# Patient Record
Sex: Male | Born: 1940 | Race: Black or African American | Hispanic: No | Marital: Married | Smoking: Light tobacco smoker
Health system: Southern US, Community
[De-identification: ages and names within clinical notes are randomized; demographics above are authoritative.]

## PROBLEM LIST (undated history)

## (undated) ENCOUNTER — Emergency Department (HOSPITAL_COMMUNITY): Admission: EM | Payer: Medicare Other | Source: Home / Self Care

## (undated) DIAGNOSIS — D649 Anemia, unspecified: Secondary | ICD-10-CM

## (undated) DIAGNOSIS — R7611 Nonspecific reaction to tuberculin skin test without active tuberculosis: Secondary | ICD-10-CM

## (undated) DIAGNOSIS — C801 Malignant (primary) neoplasm, unspecified: Secondary | ICD-10-CM

## (undated) DIAGNOSIS — B999 Unspecified infectious disease: Secondary | ICD-10-CM

## (undated) DIAGNOSIS — N189 Chronic kidney disease, unspecified: Secondary | ICD-10-CM

## (undated) DIAGNOSIS — K5900 Constipation, unspecified: Secondary | ICD-10-CM

## (undated) DIAGNOSIS — F419 Anxiety disorder, unspecified: Secondary | ICD-10-CM

## (undated) DIAGNOSIS — I10 Essential (primary) hypertension: Secondary | ICD-10-CM

## (undated) DIAGNOSIS — I8290 Acute embolism and thrombosis of unspecified vein: Secondary | ICD-10-CM

## (undated) DIAGNOSIS — T82898A Other specified complication of vascular prosthetic devices, implants and grafts, initial encounter: Secondary | ICD-10-CM

## (undated) DIAGNOSIS — Z973 Presence of spectacles and contact lenses: Secondary | ICD-10-CM

## (undated) DIAGNOSIS — Z992 Dependence on renal dialysis: Secondary | ICD-10-CM

## (undated) DIAGNOSIS — IMO0001 Reserved for inherently not codable concepts without codable children: Secondary | ICD-10-CM

## (undated) DIAGNOSIS — E079 Disorder of thyroid, unspecified: Secondary | ICD-10-CM

## (undated) DIAGNOSIS — E785 Hyperlipidemia, unspecified: Secondary | ICD-10-CM

## (undated) DIAGNOSIS — K5790 Diverticulosis of intestine, part unspecified, without perforation or abscess without bleeding: Secondary | ICD-10-CM

## (undated) DIAGNOSIS — K219 Gastro-esophageal reflux disease without esophagitis: Secondary | ICD-10-CM

## (undated) HISTORY — DX: Essential (primary) hypertension: I10

## (undated) HISTORY — DX: Diverticulosis of intestine, part unspecified, without perforation or abscess without bleeding: K57.90

## (undated) HISTORY — DX: Hyperlipidemia, unspecified: E78.5

## (undated) HISTORY — DX: Nonspecific reaction to tuberculin skin test without active tuberculosis: R76.11

## (undated) HISTORY — DX: Acute embolism and thrombosis of unspecified vein: I82.90

## (undated) HISTORY — DX: Chronic kidney disease, unspecified: N18.9

## (undated) HISTORY — DX: Dependence on renal dialysis: Z99.2

## (undated) HISTORY — DX: Disorder of thyroid, unspecified: E07.9

## (undated) HISTORY — DX: Malignant (primary) neoplasm, unspecified: C80.1

---

## 2000-01-24 ENCOUNTER — Encounter: Payer: Self-pay | Admitting: Vascular Surgery

## 2000-01-27 ENCOUNTER — Ambulatory Visit: Admission: RE | Admit: 2000-01-27 | Discharge: 2000-01-27 | Payer: Self-pay | Admitting: Vascular Surgery

## 2000-01-31 ENCOUNTER — Encounter: Payer: Self-pay | Admitting: Vascular Surgery

## 2000-01-31 ENCOUNTER — Inpatient Hospital Stay: Admission: RE | Admit: 2000-01-31 | Discharge: 2000-02-03 | Payer: Self-pay | Admitting: Vascular Surgery

## 2000-07-14 ENCOUNTER — Inpatient Hospital Stay (HOSPITAL_COMMUNITY): Admission: EM | Admit: 2000-07-14 | Discharge: 2000-07-18 | Payer: Self-pay | Admitting: *Deleted

## 2000-07-27 ENCOUNTER — Other Ambulatory Visit (HOSPITAL_COMMUNITY): Admission: RE | Admit: 2000-07-27 | Discharge: 2000-08-24 | Payer: Self-pay | Admitting: Psychiatry

## 2000-08-17 ENCOUNTER — Emergency Department (HOSPITAL_COMMUNITY): Admission: EM | Admit: 2000-08-17 | Discharge: 2000-08-17 | Payer: Self-pay | Admitting: Emergency Medicine

## 2000-08-17 ENCOUNTER — Encounter: Payer: Self-pay | Admitting: Emergency Medicine

## 2002-06-16 ENCOUNTER — Inpatient Hospital Stay (HOSPITAL_COMMUNITY): Admission: RE | Admit: 2002-06-16 | Discharge: 2002-06-24 | Payer: Self-pay | Admitting: *Deleted

## 2002-06-16 ENCOUNTER — Encounter: Payer: Self-pay | Admitting: *Deleted

## 2002-06-17 ENCOUNTER — Encounter (INDEPENDENT_AMBULATORY_CARE_PROVIDER_SITE_OTHER): Payer: Self-pay

## 2002-07-07 ENCOUNTER — Inpatient Hospital Stay (HOSPITAL_COMMUNITY): Admission: AD | Admit: 2002-07-07 | Discharge: 2002-07-13 | Payer: Self-pay | Admitting: Vascular Surgery

## 2002-07-08 ENCOUNTER — Encounter (INDEPENDENT_AMBULATORY_CARE_PROVIDER_SITE_OTHER): Payer: Self-pay | Admitting: *Deleted

## 2002-09-08 ENCOUNTER — Encounter: Admission: RE | Admit: 2002-09-08 | Discharge: 2002-11-03 | Payer: Self-pay | Admitting: Vascular Surgery

## 2002-09-29 HISTORY — PX: BELOW KNEE LEG AMPUTATION: SUR23

## 2002-12-26 ENCOUNTER — Encounter: Admission: RE | Admit: 2002-12-26 | Discharge: 2003-01-08 | Payer: Self-pay | Admitting: Family Medicine

## 2003-01-09 ENCOUNTER — Encounter: Admission: RE | Admit: 2003-01-09 | Discharge: 2003-02-02 | Payer: Self-pay | Admitting: Family Medicine

## 2005-11-20 ENCOUNTER — Encounter: Admission: RE | Admit: 2005-11-20 | Discharge: 2005-11-20 | Payer: Self-pay | Admitting: Nephrology

## 2006-05-30 DIAGNOSIS — I8290 Acute embolism and thrombosis of unspecified vein: Secondary | ICD-10-CM

## 2006-05-30 HISTORY — DX: Acute embolism and thrombosis of unspecified vein: I82.90

## 2010-01-11 ENCOUNTER — Ambulatory Visit (HOSPITAL_COMMUNITY): Admission: RE | Admit: 2010-01-11 | Discharge: 2010-01-11 | Payer: Self-pay | Admitting: Urology

## 2010-03-15 ENCOUNTER — Ambulatory Visit: Admission: RE | Admit: 2010-03-15 | Discharge: 2010-03-28 | Payer: Self-pay | Admitting: Radiation Oncology

## 2010-04-15 ENCOUNTER — Ambulatory Visit: Admission: RE | Admit: 2010-04-15 | Discharge: 2010-06-19 | Payer: Self-pay | Admitting: Radiation Oncology

## 2010-07-12 ENCOUNTER — Ambulatory Visit: Payer: Self-pay | Admitting: Vascular Surgery

## 2010-07-30 ENCOUNTER — Ambulatory Visit: Payer: Self-pay | Admitting: Vascular Surgery

## 2010-07-30 ENCOUNTER — Ambulatory Visit (HOSPITAL_COMMUNITY): Admission: RE | Admit: 2010-07-30 | Discharge: 2010-07-30 | Payer: Self-pay | Admitting: Vascular Surgery

## 2010-08-26 ENCOUNTER — Encounter (HOSPITAL_COMMUNITY)
Admission: RE | Admit: 2010-08-26 | Discharge: 2010-10-29 | Payer: Self-pay | Source: Home / Self Care | Attending: Nephrology | Admitting: Nephrology

## 2010-08-30 ENCOUNTER — Ambulatory Visit: Payer: Self-pay | Admitting: Vascular Surgery

## 2010-09-02 ENCOUNTER — Ambulatory Visit (HOSPITAL_COMMUNITY)
Admission: RE | Admit: 2010-09-02 | Discharge: 2010-09-02 | Payer: Self-pay | Source: Home / Self Care | Admitting: Vascular Surgery

## 2010-10-04 ENCOUNTER — Ambulatory Visit
Admission: RE | Admit: 2010-10-04 | Discharge: 2010-10-04 | Payer: Self-pay | Source: Home / Self Care | Attending: Vascular Surgery | Admitting: Vascular Surgery

## 2010-10-18 ENCOUNTER — Ambulatory Visit
Admission: RE | Admit: 2010-10-18 | Discharge: 2010-10-18 | Payer: Self-pay | Source: Home / Self Care | Attending: Vascular Surgery | Admitting: Vascular Surgery

## 2010-10-18 ENCOUNTER — Ambulatory Visit (HOSPITAL_COMMUNITY)
Admission: RE | Admit: 2010-10-18 | Discharge: 2010-10-18 | Payer: Self-pay | Source: Home / Self Care | Attending: Vascular Surgery | Admitting: Vascular Surgery

## 2010-10-18 NOTE — Assessment & Plan Note (Addendum)
OFFICE VISIT  Gerald Davenport, Gerald Davenport DOB:  09/08/1941                                       10/18/2010 [id number]#:[id number]  This is a postop followup.  HISTORY OF PRESENT OF ILLNESS:  This is a 70 year old gentleman who recently I saw on 10/04/2010 for a right brachiocephalic arteriovenous fistula.  At that point it was open and with a good thrill.  Apparently over the last week or so he was found by his nephrologist to longer have a pulse or a thrill within his brachiocephalic arteriovenous fistula and at this point I he does not have any steal symptomatology.  PHYSICAL EXAMINATION:  Vital signs:  Temperature 97.8, blood pressure 162/65, heart rate of 75, respirations of 12.  There is a palpable brachial pulse proximally in what I think is the fistula.  There is still a palpable pulse but I do not feel a pulse or a thrill throughout the rest of the cephalic vein so presumptively the fistula has thrombosed.  MEDICAL DECISION MAKING:  This is a 70 year old gentleman with a right brachiocephalic arteriovenous fistula that I think recently has thrombosed.  I think it is worth trying to attempt a thrombolysis on this fistula given that he is going to need dialysis per the nephrologist in the near future so I will refer him at this point to IR for a thrombolysis of this fistula.  He is going to follow up in a week. If they are unsuccessful we will need to place another access in him unfortunately.    [name], MD Electronically Signed  BLC/MEDQ  D:  10/18/2010  T:  10/18/2010  Job:  [id number]

## 2010-10-25 ENCOUNTER — Ambulatory Visit
Admission: RE | Admit: 2010-10-25 | Discharge: 2010-10-25 | Payer: Self-pay | Source: Home / Self Care | Attending: Vascular Surgery | Admitting: Vascular Surgery

## 2010-10-28 NOTE — Assessment & Plan Note (Signed)
OFFICE VISIT  Gerald, Davenport DOB:  08-06-1941                                       10/25/2010 [id number]#:[id number]  This is a postop followup.  HISTORY OF PRESENT ILLNESS:  This is a 70 year old general that previously had placed a right radiocephalic arteriovenous fistula which failed then he had a right brachiocephalic arteriovenous fistula which initially was successful which failed and was not a candidate for thrombolysis.  At this point the patient has no steal symptomatology in the right arm.  He is able to complete his activities of daily living. This patient is left hand dominant.  PHYSICAL EXAMINATION:  Vital signs:  Left-sided blood pressure 216/95, heart rate 84, respirations were 24.  On examination of the right arm he has a palpable brachial pulse.  No palpable radial pulse.  There is fullness in the tract of the brachiocephalic arteriovenous fistula which is thrombosed.  He does not have a bulging basilic vein on tourniquet examination.  MEDICAL DECISION MAKING:  This is a 70 year old gentleman who is now 2 failed fistulas in the right arm.  I am concerned with proceeding with basilic vein transposition as he needs a staged basilic vein transposition.  Given 2 failures one of the concerns is the possibility of a central venous stenosis as the etiology for these failures.  Though the patient is at chronic kidney disease stage 3 I think that the benefits in this case outweigh the risks in this patient in regards to doing a right arm venogram with central venogram to determine if his central veins are patent.  The patient is aware that this procedure involves installation of dye and there is possibility of anaphylactic reaction and possible hastening renal failure.  We will try to keep the dye load to a minimal.  The patient understands and agrees to proceed forward with such.  Will arrange it for this coming Thursday.  After that we will  determine which access would be his best option in his right arm.    Gerald Hertz, MD Electronically Signed  BLC/MEDQ  D:  10/25/2010  T:  10/28/2010  Job:  [id number]

## 2010-10-31 ENCOUNTER — Ambulatory Visit (HOSPITAL_COMMUNITY)
Admission: RE | Admit: 2010-10-31 | Discharge: 2010-10-31 | Disposition: A | Discharge status: Home / Self Care | Payer: Medicare Other | Source: Ambulatory Visit | Attending: Vascular Surgery | Admitting: Vascular Surgery

## 2010-10-31 DIAGNOSIS — N186 End stage renal disease: Secondary | ICD-10-CM

## 2010-10-31 DIAGNOSIS — T82898A Other specified complication of vascular prosthetic devices, implants and grafts, initial encounter: Secondary | ICD-10-CM

## 2010-10-31 DIAGNOSIS — I120 Hypertensive chronic kidney disease with stage 5 chronic kidney disease or end stage renal disease: Secondary | ICD-10-CM

## 2010-10-31 DIAGNOSIS — N189 Chronic kidney disease, unspecified: Secondary | ICD-10-CM | POA: Insufficient documentation

## 2010-10-31 LAB — POCT I-STAT, CHEM 8
BUN: 32 mg/dL — ABNORMAL HIGH (ref 6–23)
Calcium, Ion: 1.16 mmol/L (ref 1.12–1.32)
Chloride: 104 mEq/L (ref 96–112)
Creatinine, Ser: 2.8 mg/dL — ABNORMAL HIGH (ref 0.4–1.5)
Glucose, Bld: 179 mg/dL — ABNORMAL HIGH (ref 70–99)
Hemoglobin: 10.5 g/dL — ABNORMAL LOW (ref 13.0–17.0)
TCO2: 28 mmol/L (ref 0–100)

## 2010-11-01 ENCOUNTER — Ambulatory Visit: Admit: 2010-11-01 | Payer: Self-pay | Admitting: Vascular Surgery

## 2010-11-01 ENCOUNTER — Ambulatory Visit (INDEPENDENT_AMBULATORY_CARE_PROVIDER_SITE_OTHER): Payer: Medicare Other | Admitting: Vascular Surgery

## 2010-11-01 DIAGNOSIS — N186 End stage renal disease: Secondary | ICD-10-CM

## 2010-11-04 ENCOUNTER — Ambulatory Visit (HOSPITAL_COMMUNITY)
Admission: RE | Admit: 2010-11-04 | Discharge: 2010-11-04 | Disposition: A | Discharge status: Home / Self Care | Payer: Medicare Other | Source: Ambulatory Visit | Attending: Vascular Surgery | Admitting: Vascular Surgery

## 2010-11-04 DIAGNOSIS — N189 Chronic kidney disease, unspecified: Secondary | ICD-10-CM | POA: Insufficient documentation

## 2010-11-04 DIAGNOSIS — N186 End stage renal disease: Secondary | ICD-10-CM

## 2010-11-04 DIAGNOSIS — I120 Hypertensive chronic kidney disease with stage 5 chronic kidney disease or end stage renal disease: Secondary | ICD-10-CM

## 2010-11-04 HISTORY — PX: AV FISTULA PLACEMENT: SHX1204

## 2010-11-04 LAB — POCT I-STAT 4, (NA,K, GLUC, HGB,HCT): Potassium: 3.6 mEq/L (ref 3.5–5.1)

## 2010-11-04 LAB — GLUCOSE, CAPILLARY: Glucose-Capillary: 233 mg/dL — ABNORMAL HIGH (ref 70–99)

## 2010-11-08 NOTE — Assessment & Plan Note (Signed)
OFFICE VISIT  ADAEL, CULBREATH DOB:  13-Jul-1941                                       11/01/2010 [id number]#:[id number]  This is an established patient.  HISTORY OF PRESENT ILLNESS:  This is a 70 year old gentleman who has now undergone 2 right arm accesses with failed radiocephalic and brachiocephalic.  I recently did a venogram on his right arm and it looks like his only option in this arm is a graft.  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  He is left hand dominant and never has had any access placed on the left side.  This patient's past medical history, past surgical history, social history, family history, allergies, medications, and review of systems are unchanged from previous evaluations.  I would refer you to my dictation of July 12, 2010, for the details.  PHYSICAL EXAMINATION:  Today, he had a blood pressure of 174/81, heart rate 65, respirations were 12.  His temperature is 97.9.  General examination:  Well-developed, well-nourished, no apparent distress.  Pulmonary exam:  Lungs were clear to auscultation bilaterally.  No rales, rhonchi, or wheezing.  Symmetric expansion.  Cardiac exam:  Regular rate and rhythm.  Normal S1-S2.  No murmurs, rubs, or thrills.  Vascular exam:  He had easily palpable brachial pulses bilaterally.  I could not feel radial pulse on the right side.  There is a weak radial pulse on the left side.  Palpable femorals bilaterally.  Abdominal exam:  He had a soft abdomen.  Nontender, nondistended.  No guarding or rebound.  No splenomegaly.  Musculoskeletal exam:  He had 5/5 strength in all extremities.  Hand grip was 5/5 in upper extremities.  Intrinsic and extrinsic muscle strength was intact.  His right arm has evidence of 2 accesses in the brachiocephalic and radiocephalic that have healed well and there is no palpable  thrill in either access.  In his left arm, he has a distended cephalic vein which on SonoSite evaluation was noted to be at least 3 mm throughout and appears to be patent all the way up to its confluence with likely the high brachial vein.  Neuro exam:  Cranial nerves II-XII were intact.  Motor was as listed above.  Sensation was grossly intact including both hands.  MEDICAL DECISION MAKING:  This is a 70 year old gentleman who is status post a recent venogram, status post 2 failed right accesses.  The patient either has chronic kidney disease stage 3 or 4, and I do not think that placement of a right arm graft is to his advantage given the limited patency of these grafts.  There would be no point in placing the graft if this patient is not within 2-3 months of starting dialysis which, based on the most recent creatinine, I suspect he is not quite in that window.  At this point, he has acceptable option in the upper arm in the form of a left brachiocephalic arteriovenous fistula.  I think it is worth attempting.  We discussed again the nature of access surgery. He is aware that the risks include possible nerve damage, possible bleeding, infection, possible steal, possible ischemic monomelic neuropathy, possible need for additional procedures, and possibility of failure to mature.  He is aware of this and at this  point agrees to proceed forward.  We will set him up for this coming Monday for a left brachiocephalic arteriovenous fistula.    [name], MD Electronically Signed  BLC/MEDQ  D:  11/01/2010  T:  11/01/2010  Job:  [id number]

## 2010-11-09 NOTE — Op Note (Signed)
  NAME:  Gerald Davenport, Gerald Davenport NO.:  [id number]  MEDICAL RECORD NO.:  [id number]           PATIENT TYPE:  O  LOCATION:  [facility]                         FACILITY:  [facility]  PHYSICIAN:  [name], MD       DATE OF BIRTH:  Oct 13, 1940  DATE OF PROCEDURE:  10/31/2010 DATE OF DISCHARGE:  10/31/2010                              OPERATIVE REPORT   PROCEDURE:  Right arm and central venogram.  PREOPERATIVE DIAGNOSIS:  Chronic kidney disease, failed multiple accesses.  POSTOPERATIVE DIAGNOSIS:  Chronic kidney disease, failed multiple accesses.  SURGEON:  [name], MD  ANESTHESIA:  Noncontrast 25 mL.  FINDINGS: 1. An occluded right cephalic vein. 2. Small right basilic vein that was less than 2 mm. 3. There was patent small brachial vein. 4. Patent axillary vein that was about 6-1/2 mm in diameter. 5. 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 6. The superior vena cava appears patent.  INDICATIONS:  This 70 year old gentleman that now has undergone a right radiocephalic and brachiocephalic arteriovenous fistulas, both of which failed.  The brachiocephalic had actually been patent for approximately 6 weeks, then proceeded to thrombose.  With multiple accesses failing in this patient, I had concerns of whether or not there was some degree of central stenosis as the etiology of this patient's failed accesses, so we discussed going back to the angio suite and performing a right arm and central venogram to determine possibly if the central vasculature or the upper arm vasculature was not compatible with continued accesses. He is aware of the risks of this procedure which included possible anaphylactic reaction to the contrast dye and also possible induction of end-stage renal disease.  The patient is aware of the risks and agreed to proceed forward.  DESCRIPTION OF THE PROCEDURE:  After full informed written consent  was obtained from the patient, he was brought back to the angio suite and placed supine upon angio table.  He was connected to the monitoring equipment and then his forearm IV was connected to extension tubing. Under fluoroscopic guidance, two hand injections were completed to image the upper arm venous structures and also the central venous structures.  Based on these findings, it appears that his central veins are still patent and the patient is not on dialysis right now and so subsequently I think placement of arteriovenous graft in this gentleman is a little bit preliminary.  He is going to follow up with me on Friday and will probably map out his dominant hand, his  left hand to see if there is any fistula options on this side.  COMPLICATIONS:  None.  CONDITION:  Stable.     [name], MD     BLC/MEDQ  D:  10/31/2010  T:  11/01/2010  Job:  [id number]  Electronically Signed by Leonides Sake MD on 11/03/2010 05:08:54 PM

## 2010-11-12 NOTE — Op Note (Signed)
NAME:  Gerald Davenport, Gerald Davenport NO.:  [id number]  MEDICAL RECORD NO.:  [id number]           PATIENT TYPE:  O  LOCATION:  [facility]                         FACILITY:  [facility]  PHYSICIAN:  [name], MD       DATE OF BIRTH:  1941/04/26  DATE OF PROCEDURE: DATE OF DISCHARGE:  11/04/2010                              OPERATIVE REPORT   PROCEDURE:  Placement of a left brachiocephalic arteriovenous fistula.  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  SURGEON:  [name], MD  ASSISTANT:  Pecola Leisure, PA  ANESTHESIA:  Monitored anesthesia care and local.  FINDINGS:  In this case included a dopplerable left radial signal and a dopplerable outflow signal in a cephalic vein.  SPECIMENS:  None.  ESTIMATED BLOOD LOSS:  Minimal.  INDICATIONS:  This is a 70 year old gentleman who has now a chronic kidney disease stage III or IV, and  previously, he has undergone two attempts at a fistula in the right arm and then underwent a venogram, which demonstrated no further fistula options in his right arm.  As this patient is not currently requiring dialysis, it was felt that an attempt at a left- sided fistula would be indicated, so we discussed proceeding with a left brachiocephalic arteriovenous fistula.  He is aware of the risks of this procedure which were similar to the previous access procedures including bleeding, infection, nerve damage, steal syndrome, possible ischemic monomelic neuropathy, possible failure to mature, and possible need for additional procedures.  He is aware of these risks and agreed to proceed forward with such.  DESCRIPTION OF THE OPERATION:  After full informed written consent was obtained from the patient, he was brought back to the operating room and placed supine upon the operating table.  Prior to induction, he  received IV antibiotics.  After adequate anesthesia was obtained, he was then prepped and draped in standard fashion for a left arm access procedure. I turned my attention to his antecubitum, where I made an incision transversely to expose both the cephalic vein and also the brachial artery.  Using blunt dissection and electrocautery, I developed a plane down to the brachial artery, was noted to be a good 4-5 mm in diameter with a decent pulse and then dissected to the cephalic vein.  In the antecubitum, there were some signs of some sclerotic changes around this vein.  I was able to dissect down onto the forearm slightly.  After we dissected out enough of the vein, I then clamped the vein distally, and transected the vein and then tied off the distal vein with a 2-0 silk.  There was not good venous backbleeding from this vein.  I then interrogated it with dilators, ranging from a 2.5 mm up to a 4 mm.  These all passed without any resistance; however, there continued to be limited venous backbleeding.  I then flushed this artery with heparinized saline and clamped it with a clamp.  I then turned my attention to the artery,  reset the exposure.  The artery was placed under tension proximally and distally with vessel loops and then I made an arteriotomy in the artery with a 11 blade, and extended it with a Potts scissor for about a 4.5-mm arteriotomy.  I allowed the artery to bleed antegrade.  There was good pulsatile bleeding and allowed the artery also to bleed retrograde, and there was minimal amount of retrograde bleeding.  I flushed out the artery proximally and distally with heparinized saline and then at this point I took the vein which looked to be at least 4-4.5 mm in diameter and it was sewn in an end-to-side configuration to the brachial artery with a running stitch of 7-0 Prolene.  Prior to completing this anastomosis, I allowed the artery to back bleed and antegrade bleed.  There  was good flow without any frank clot.  I then completed the anastomosis in the usual fashion and then the vessel loops were released immediately.  There was not a strong pulse in the vein but with time, it improved.  I could not feel a distal radial pulse.  Using a continuous Doppler, I interrogated the radial artery, there was a strong radial signal and then proximal and distal to the brachial artery, there were multiphasic waveforms.  I did obtain a dopplerable outflow signal in the cephalic vein that was consistent with a widely patent arteriovenous fistula.  At this point, I examined the lay of the vein and it required some additional dissection to improve the lay.  There was one area where a side branch of the vein needed to be clamped off and tied with a 4-0.  At this point, after irrigating out the wound with non heparinized saline, there was no more active bleeding.  The subcutaneous tissue was reapproximated with a layer of 3-0 Vicryl and then the skin was reapproximated with running subcuticular 4-0 and then the skin was cleaned, dried, and reinforced with Dermabond.  The patient was allowed to waken without difficulties with plan to discharge home.  COMPLICATIONS:  None.  CONDITION:  Stable.     [name], MD     BLC/MEDQ  D:  11/04/2010  T:  11/05/2010  Job:  [id number]  Electronically Signed by Leonides Sake MD on 11/06/2010 04:23:00 PM

## 2010-12-05 ENCOUNTER — Ambulatory Visit (HOSPITAL_COMMUNITY): Payer: Medicare Other | Attending: Nephrology

## 2010-12-05 DIAGNOSIS — N186 End stage renal disease: Secondary | ICD-10-CM | POA: Insufficient documentation

## 2010-12-06 ENCOUNTER — Ambulatory Visit (INDEPENDENT_AMBULATORY_CARE_PROVIDER_SITE_OTHER): Payer: Medicare Other | Admitting: Vascular Surgery

## 2010-12-06 DIAGNOSIS — N186 End stage renal disease: Secondary | ICD-10-CM

## 2010-12-06 NOTE — Assessment & Plan Note (Signed)
OFFICE VISIT  CAELUM, FEDERICI DOB:  12/23/1940                                       12/06/2010 [id number]#:[id number]  This is a postop followup.  HISTORY OF PRESENT ILLNESS:  This is a 70 year old gentleman who is now status post a left brachiocephalic arteriovenous fistula placed on 11/04/2010.  Since then the patient has had no fevers or chills.  The wounds have healed up nicely.  No steal symptoms and is able to complete his activities of daily living.  PHYSICAL EXAMINATION:  Today, he had a temperature of 98.1, blood pressure of 104/79, heart rate of 76, respirations of 12.  On focused exam the left arm incision is well-healed.  There is a palpable thrill throughout the upper arm.  On SonoSite interrogation it ranges from 5 mm up to 7 mm in diameter.  There is an obvious competing branch in the upper arm that is obviously siphoning blood away from the cephalic vein.  MEDICAL DECISION MAKING:  A 70 year old gentleman with a successful brachiocephalic arteriovenous fistula.  The patient is not end-stage renal and does not require hemodialysis currently.  My plan would be to ligate the one competing vein as evident in this upper arm cephalic vein and this hopefully will be enough to tip his left brachiocephalic arteriovenous fistula into a usable size throughout.  The patient agrees with this plan and we will get him taken of on March 19.    [name], MD Electronically Signed  BLC/MEDQ  D:  12/06/2010  T:  12/06/2010  Job:  [id number]

## 2010-12-09 LAB — GLUCOSE, CAPILLARY
Glucose-Capillary: 213 mg/dL — ABNORMAL HIGH (ref 70–99)
Glucose-Capillary: 221 mg/dL — ABNORMAL HIGH (ref 70–99)

## 2010-12-09 LAB — SURGICAL PCR SCREEN: MRSA, PCR: NEGATIVE

## 2010-12-10 LAB — POCT I-STAT 4, (NA,K, GLUC, HGB,HCT)
Glucose, Bld: 204 mg/dL — ABNORMAL HIGH (ref 70–99)
HCT: 30 % — ABNORMAL LOW (ref 39.0–52.0)
Hemoglobin: 10.2 g/dL — ABNORMAL LOW (ref 13.0–17.0)
Sodium: 140 mEq/L (ref 135–145)

## 2010-12-10 LAB — GLUCOSE, CAPILLARY
Glucose-Capillary: 164 mg/dL — ABNORMAL HIGH (ref 70–99)
Glucose-Capillary: 186 mg/dL — ABNORMAL HIGH (ref 70–99)

## 2010-12-10 LAB — SURGICAL PCR SCREEN
MRSA, PCR: NEGATIVE
Staphylococcus aureus: POSITIVE — AB

## 2010-12-16 ENCOUNTER — Ambulatory Visit (HOSPITAL_COMMUNITY)
Admission: RE | Admit: 2010-12-16 | Discharge: 2010-12-16 | Disposition: A | Discharge status: Home / Self Care | Payer: Medicare Other | Source: Ambulatory Visit | Attending: Vascular Surgery | Admitting: Vascular Surgery

## 2010-12-16 DIAGNOSIS — Z992 Dependence on renal dialysis: Secondary | ICD-10-CM | POA: Insufficient documentation

## 2010-12-16 DIAGNOSIS — Y849 Medical procedure, unspecified as the cause of abnormal reaction of the patient, or of later complication, without mention of misadventure at the time of the procedure: Secondary | ICD-10-CM | POA: Insufficient documentation

## 2010-12-16 DIAGNOSIS — N186 End stage renal disease: Secondary | ICD-10-CM

## 2010-12-16 DIAGNOSIS — E119 Type 2 diabetes mellitus without complications: Secondary | ICD-10-CM | POA: Insufficient documentation

## 2010-12-16 DIAGNOSIS — T82898A Other specified complication of vascular prosthetic devices, implants and grafts, initial encounter: Secondary | ICD-10-CM

## 2010-12-16 DIAGNOSIS — I120 Hypertensive chronic kidney disease with stage 5 chronic kidney disease or end stage renal disease: Secondary | ICD-10-CM

## 2010-12-16 DIAGNOSIS — T82598A Other mechanical complication of other cardiac and vascular devices and implants, initial encounter: Secondary | ICD-10-CM | POA: Insufficient documentation

## 2010-12-16 LAB — POCT I-STAT 4, (NA,K, GLUC, HGB,HCT)
Glucose, Bld: 186 mg/dL — ABNORMAL HIGH (ref 70–99)
Hemoglobin: 8.8 g/dL — ABNORMAL LOW (ref 13.0–17.0)

## 2010-12-17 NOTE — Op Note (Signed)
  NAME:  Gerald Davenport, TRUMBULL NO.:  [id number]  MEDICAL RECORD NO.:  [id number]           PATIENT TYPE:  O  LOCATION:  [facility]                         FACILITY:  [facility]  PHYSICIAN:  [name], MD       DATE OF BIRTH:  1941-04-22  DATE OF PROCEDURE:  12/16/2010 DATE OF DISCHARGE:  12/16/2010                              OPERATIVE REPORT   PROCEDURE:  Ligation of left brachiocephalic arteriovenous fistula competing branch.  PREOPERATIVE DIAGNOSIS:  Non-maturing left brachiocephalic arteriovenous fistula and competing branch and left upper arm fistula.  POSTOPERATIVE DIAGNOSES:  Non-maturing left brachiocephalic arteriovenous fistula and competing branch and left upper arm fistula.  SURGEON:  Arlys Lorrie L. Imogene Burn, MD  There were no assistants.  ANESTHESIA:  Monitored anesthesia care and local.  FINDINGS:  In this case included resolution of branch distention with ligation and transection and also palpable thrill throughout this graft at the end of case.  SPECIMENS:  None.  ESTIMATED BLOOD LOSS:  Minimum.  INDICATIONS:  This is a 70 year old gentleman I had previously placed a left brachiocephalic arteriovenous fistula.  The brachiocephalic arteriovenous fistula had been distending appropriately, but was not quite at the necessary 6 mm for use for dialysis purposes.  There was on obvious branch in the upper arm that was siphoning flow from this fistula on just visual examination.  I felt that ligating this would improve his chances of fully distending and maturing this brachiocephalic arteriovenous fistula.  The patient agreed to proceed forward with the procedure.  He is aware of the risks of this procedure include bleeding, possible infection, and possible thrombosis of the brachiocephalic arteriovenous fistula.  He agreed to proceed forward.  DESCRIPTION OF OPERATION:  After full informed written consent was obtained from the patient, he was brought back to the  operating room and placed supine upon the operating table.  After obtaining adequate sedation, he was prepped and draped in standard fashion for a left arm access procedure.  I turned my attention to his upper arm where I had previously marked the competing branch, injected about 1 mL of 1% lidocaine with epinephrine and 0.5% Marcaine without epinephrine to obtain anesthesia.  I made an incision here with a 15 blade and then dissected down to the vein.  This was pulled into view and then a right angle placed around it, then I placed two 2-0 ties of silk around this vein, and then tied it off and then transected this vein.  At this point, then I closed the skin incision with an U stitch of 4-0 Monocryl. The patient's arm was then cleaned, dried, and then Dermabond used to reinforce the skin closure.  The patient awoke without any difficulties and was discharged home without any problems.  COMPLICATIONS:  None.  CONDITION:  Stable.     [name], MD     BLC/MEDQ  D:  12/16/2010  T:  12/17/2010  Job:  [id number]  Electronically Signed by Leonides Sake MD on 12/17/2010 10:56:34 AM

## 2010-12-27 ENCOUNTER — Ambulatory Visit (INDEPENDENT_AMBULATORY_CARE_PROVIDER_SITE_OTHER): Payer: Medicare Other | Admitting: Vascular Surgery

## 2010-12-27 DIAGNOSIS — N186 End stage renal disease: Secondary | ICD-10-CM

## 2010-12-30 NOTE — Assessment & Plan Note (Signed)
OFFICE VISIT  ORMAN, MATSUMURA DOB:  01-17-1941                                       12/27/2010 [id number]#:[id number]  This is a postop followup.  This gentleman I took back to the operating room recently for a ligation of a competing branch.  At this point he notes no steal symptomatology in his left hand and is able to complete activities of daily living without any problems.  PHYSICAL EXAMINATION:  Today he had a blood pressure of 151/70, heart rate of 88, respirations were 12.  On focused examination he had a healing incision in the left upper arm.  The brachiocephalic arteriovenous fistula looks larger and more visible than previous.  On SonoSite evaluation he is now >6 mm throughout most of this fistula. There is an easily palpable thrill.  He has full hand grip in his left arm and sensation grossly intact in his fingertips.  MEDICAL DECISION MAKING:  This is a 70 year old gentleman who is now at least chronic kidney disease stage 4 and possibly 5, not end-stage renal at this point.  My suspicion is this left brachiocephalic arteriovenous fistula will be easily usable for dialysis if eventually his kidney function deteriorates to the point of him becoming end-stage renal.  At this point I will release him from our care.  I discussed with him some exercises to possibly improve dilation in this fistula and then also he is going to keep an eye on this.  He knows that if at any point the thrill decreased he is going to go ahead and call us and we can possibly reevaluate it at that point and possible fistulogram or intervention as necessary at that point.  Thank you for giving [place] the opportunity to participate in this patient's care.    [name], MD Electronically Signed  BLC/MEDQ  D:  12/27/2010  T:  12/30/2010  Job:  [id number]

## 2011-02-11 NOTE — Assessment & Plan Note (Signed)
OFFICE VISIT   Davenport, Gerald  DOB:  1941-01-15                                       07/12/2010  GNFAO#:[id number]   This is a new patient consultation.  It is requested by Dr. Sadie Haber with [facility].  Reason for consultation is new  permanent access.   HISTORY OF PRESENT ILLNESS:  This is a 70 year old gentleman who now  presents with chief complaint of need for permanent access.  He has  chronic kidney disease stage IV that is felt over the next few months  may require initiation of hemodialysis.  He notes he is left hand  dominant, never had any previous accesses, never previous any central  venous catheterizations, previously no sensory or motor deficits.   PAST MEDICAL HISTORY:  1. He has chronic kidney disease stage IV.  2. Hypertension.  3. Diabetes.  4. Secondary hyperparathyroidism.  5. Tobacco abuse.  6. Prostate cancer treated with radiation therapy.   PAST SURGICAL HISTORY:  Right below the knee amputation.   SOCIAL HISTORY:  He is an active smoker with a 50 pack year history.  Denies any alcohol or illicit drug use.   FAMILY HISTORY:  Father had peripheral arterial disease and mother had a  stroke.   MEDICATIONS:  Ramipril, metformin, Zocor, glyburide, doxazosin,  Wellbutrin, Seroquel, clonidine, Lasix, Micardis / hydrochlorothiazide,  aspirin, Optiva.   REVIEW OF SYSTEMS:  Noted slurred speech and prostate cancer and kidney  disease.  Otherwise the rest of his 12 point review of system was noted  be negative.   PHYSICAL EXAMINATION:  Vital signs:  Blood pressure 174/76, heart rate  88, respirations were 12.  General:  Alert and oriented x3, well-developed, well-nourished.  Head:  Normocephalic, atraumatic.  ENT:  Hearing was grossly intact.  The nares without any erythema or  drainage.  The oropharynx without erythema or exudate.  Eyes:  Pupils equal, reactive to light.  Extraocular movements were  intact.  Neck:  Supple neck.  No nuchal rigidity.  No palpable lymphadenopathy.  Pulmonary:  Symmetric expansion.  Good air movement.  He was clear to  auscultation all fields.  No rales, rhonchi or wheezing.  Cardiac:  Regular rate and rhythm.  Normal S1-S2.  No murmurs, rubs,  thrills or gallops.  Vascular:  He had palpable bilateral radial, ulnar,  brachial and carotid pulses.  There were no bruits bilaterally.  Aorta  was not palpable.  He had palpable femoral pulses bilaterally.  His  right popliteal I did not appreciate any pulses.  His DP and PT are not  applicable as he is a below the knee amputation on this site.  On his  left side I do not palpate a popliteal, dorsalis pedis or posterior  tibial pulse.  GI:  Soft, nontender, nondistended.  No guarding or rebound.  No  hepatosplenomegaly.  No masses.  No costovertebral angle tenderness.  Musculoskeletal:  Bilateral upper extremities had intact 5/5 strength,  intrinsic extrinsic strength in upper extremities was intact.  Both legs  had intact extension and flexion  at the hips.  The left leg had intact  plantar flexion and dorsal flexion.  The right leg was not able to  interrogate the lower extremity as he has a BKA on this site.  Neuro:  Cranial nerves II-XII are intact.  Pain and light touch was  intact except in the right lower extremity because of the amputation.  Motor exam was as noted above.  Psychiatric:  Judgment was intact.  Mood  and affect were appropriate to the clinical situation.  Skin:  Refer to the musculoskeletal exam for the extremities.  There  were no rashes otherwise noted.  Lymphatic:  There was no cervical, axillary or inguinal lymphadenopathy.   NON-INVASIVE VASCULAR STUDIES:  :  He had bilateral cephalic and basilic  vein mapping.  His right arm was the nondominant arm and demonstrates a  cephalic that ranges from 3.9 mm down to 2.5 mm in the forearm.  However, with distention to try to verify this I  felt that in actuality  his vein appeared to be bigger than 2.3 mm.  He has also on the right  side an acceptable basilic vein for use.  On the left side he has  cephalic vein both upper and lower arms that are acceptable diameters  for possible use as a fistula and his left basilic vein is adequate  caliber.   MEDICAL DECISION MAKING:  This is a 69 year old male with chronic kidney  disease stage IV that is getting close to requiring dialysis.  His  nephrologist requests evaluation at this point for permanent access.  Based on his vein mapping he would be an acceptable candidate for a  right radiocephalic fistula.  We discussed in about 10 minutes the  risks, benefits and alternatives to placement of arteriovenous fistula.  We discussed extensively the risks of but no limited to: steal, ischemic  monomelic neuropathy, failure to mature, need for additional dialysis  procedures, and nerve damage,  and he agrees at this point to proceed  forward with the procedure.  He is tentatively scheduled for 07/30/2010.     Leonides Sake, MD  Electronically Signed   BC/MEDQ  D:  07/12/2010  T:  07/15/2010  Job:  [id number]

## 2011-02-11 NOTE — Procedures (Signed)
CEPHALIC VEIN MAPPING   INDICATION:  Preop vein mapping for AVF placement.   HISTORY:  End-stage renal disease.   EXAM:  The right cephalic vein is compressible.   Diameter measurements range from 0.23 cm to 0.44 cm.   The right basilic vein is compressible.   Diameter measurements range from 0.21 to 0.41 cm.   The left cephalic vein is compressible.   Diameter measurements range from 0.27 to 0.51.   The left basilic vein is compressible.   Diameter measurements range from 0.23 to 0.55 cm.   See attached worksheet for all measurements.   IMPRESSION:  Patent right and left cephalic and basilic veins.   ___________________________________________  Leonides Sake, MD   EM/MEDQ  D:  07/12/2010  T:  07/12/2010  Job:  [id number]

## 2011-02-11 NOTE — Assessment & Plan Note (Signed)
OFFICE VISIT   URIAH, PHILIPSON  DOB:  1941/02/28                                       10/04/2010  EAVWU#:[id number]   This is a postop followup.   HISTORY OF PRESENT ILLNESS:  This is a 70 year old gentleman status post  a right brachiocephalic arteriovenous fistula placement who presents now  for followup.  Since his previous procedure has had no steal symptoms,  able to complete activities of daily living.  No numbness or weakness  noted in the right hand.   PHYSICAL EXAMINATION:  Vital signs:  Blood pressure 177/71, heart rate  of 81, respirations were 12.  Focused exam:  He has healed up his right  antecubital incision.  There is a palpable thrill in the upper arm and  you can actually see the distention on the brachiocephalic arteriovenous  fistula.   On ultrasound at some segments only about 4 mm, at some segments it is  up to 5.5 mm.   MEDICAL DECISION MAKING:  This is a 70 year old gentleman status post a  right brachiocephalic arteriovenous fistula about postop day 30.  At  this point the brachiocephalic arteriovenous fistula has not adequately  dilated.  He is not end-stage renal requiring hemodialysis yet so there  is at this point no rush to try to complete any type of balloon assisted  maturation.  I am going to have him follow up in 1 month and will re-  ultrasound the fistula at that point to see if it has obtained adequate  dilation.  He is going to work on exercises to improve the diameter of  this vein.  He also has successfully undergone this procedure without  any development of steal symptoms.     [name], MD  Electronically Signed   BLC/MEDQ  D:  10/04/2010  T:  10/07/2010  Job:  [id number]   cc:   Aram Beecham B. Eliott Nine, M.D.

## 2011-02-11 NOTE — Assessment & Plan Note (Signed)
OFFICE VISIT   RAYMONE, PEMBROKE  DOB:  July 20, 1941                                       08/30/2010  ZOXWR#:[id number]   This is an established patient, postop follow-up.   HISTORY OF PRESENT ILLNESS:  This is a 70 year old gentleman who is now  status post a right radiocephalic arteriovenous fistula that was  completed on July 30, 2010.  The patient is sent back here from the  nephrologist as the fistula occluded.  The patient is not aware of  exactly when it occluded.  He at this point has no steal symptoms. with  no numbness in his hands.  He is able to complete his activities of  daily living without any problems.  There are no significant changes in  his past medical history or past surgical history, social history,  family history, review of systems, medications, or allergies.  Please  refer to my previous dictation on the 14th of  October 2011 for that  information.   PHYSICAL EXAMINATION:  Today he had a blood pressure of 158/73, heart  rate 64, respirations 12, temperature is 98.  On focused exam on the  right arm he has no palpable thrill in the forearm.  There is a palpable  radial pulse.  Hand grip 5/5.  Sensation grossly was intact on testing.  He has good intrinsic and extrinsic hand muscle strength on this side.   MEDICAL DECISION MAKING:  This 70 year old gentleman is status post  failed right radiocephalic arteriovenous fistula.  Based on his previous  vein mapping, he has an upper arm cephalic vein ranging 0.29-0.37 cm.  This would be my next location for attempted arteriovenous fistula.  He  is tentatively scheduled for the September 02, 2010.  We discussed the  risks of this procedure which included but were not limited to bleeding,  infection, nerve damage, steal,  ischemic monomelic neuropathy, possible  failure to mature and possible need for additional procedures.  He is  aware of such and agrees to proceed forth with  operation.     Leonides Sake, MD  Electronically Signed   BC/MEDQ  D:  08/30/2010  T:  08/30/2010  Job:  [id number]

## 2011-02-14 NOTE — H&P (Signed)
[facility]  Patient:    Gerald Davenport, Gerald Davenport                          MRN: [id number] Adm. Date:  [id number] Disc. Date: [id number] Attending:  Denny Peon                   Psychiatric Admission Assessment  INTRODUCTION:  Patient is a 70 year old black married male with long history of heavy drinking, at present drinking one fifth of vodka and several beers daily.  This has been drinking pattern for past 10 years.  He came for detox. He tried to stop on his own but after 2-3 days of sobriety he relapsed.  He does not give history of withdrawal, delirium or seizures.  Patient feels that alcohol hurts his health especially elevates his blood pressure and destroys his family.  He denies depression, hallucinations or dangerous ideation.  PAST PSYCHIATRIC HISTORY:  He was depressed about being laid off his job but recover with treatment.  Never hospitalized for detox and depression.  His family doctor prescribed Wellbutrin 150 mg twice a day with good results.  SOCIAL HISTORY:  Patient is a retired Psychiatric nurse. Ninth grade education. Married, patient has a wife and two sons.  FAMILY HISTORY:  There is no history of mental or substance abuse problems within his blood relatives.  ALCOHOL AND DRUG HISTORY:  Patient drinks approximately one fifth of vodka and 6-8 beers on a daily basis for at least 10 years and he also smokes one pack of cigarettes per day.  MEDICAL HISTORY:  Patient suffers from non-insulin-dependent diabetes mellitus, hypertension, has problems with peripheral circulation, lower leg bypass surgery for this reason.  He suffers from benign prostatic hypertrophy.  CURRENT MEDICATIONS:  Glucophage, Altace, verapamil, hydrochlorothiazide, Vioxx, ______________, glyburide, Wellbutrin, as well as Tricor.  PHYSICAL EXAMINATION:  Normal physical examination in emergency room.  ALLERGIES:  Patient is allergic to [facility].  MENTAL STATUS  EXAMINATION:  Elderly black male, balding, cooperative, pleasant.  No abnormal movements, tremors, normal gait.  Speech was normal but slightly slurred.  Patient does not wear his denture.  Mood was euthymic. Affect was anxious.  Thoughts were organized and goal directed.  No dangerous ideations.  No paranoia.  Alert and oriented x 3 with good memory.  Attention: Recalled 2/3 objects.  Decreased concentration.  Normal intelligence.  Insight good.  Judgment good.  DIAGNOSTIC IMPRESSION: Axis I:    1. Alcohol dependence.            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 Axis II:   No diagnosis. Axis III:  1. Non-insulin-dependent diabetes mellitus.            2. Hypertension.            3. Benign prostatic hypertrophy. Axis IV:   Moderate. Axis V:    Global Assessment of Functioning 40; past year 60.  PLAN:  Will go through phenobarbital detox.  Decrease Wellbutrin which could increase risk of seizures while patient is in detox.  Will check fasting blood sugar as well as monitor patients blood pressure. DD:  07/19/00 TD:  07/20/00 Job: [id number]

## 2011-02-14 NOTE — Discharge Summary (Signed)
[facility]  Patient:    Gerald Davenport, Gerald Davenport                          MRN: [id number] Adm. Date:  [id number] Disc. Date: [id number] Attending:  Annamarie Dawley Dictator:   Johnella Moloney, NP                           Discharge Summary  HISTORY OF PRESENT ILLNESS:  [name] is a 70 year old black married male with a long history of heavy drinking, at present drinking one fifth of vodka and several beers daily.  This has been his drinking pattern for the past 10 years and he came in for detox.  He tried to stop on his own but after 2 or 3 days of sobriety he relapsed.  He does not give a history of withdrawal delirium or seizures.  Patient feels that alcohol hurts his health especially, elevates his blood pressure and destroys his family.  He denies depression,  The patient states that he has never been hospitalized for detox or depression.  His family doctor has treated him on an outpatient basis with Wellbutrin 150 mg b.i.d. with good results.  PAST MEDICAL HISTORY:  Patient does have a primary care physician, name unknown.  His medical problems include diabetes mellitus type 2, hypertension, peripheral circulation problems, benign prostatic hypertrophy.  Current medications include Glucophage, Altace, verapamil, HCTZ, Vioxx, Glyburide, Wellbutrin and Tri-Cor.  DRUG ALLERGIES:  He reports being allergic to TRI-COR.  PHYSICAL EXAMINATION:  It was done in the Emergency Department and there were no abnormal findings.  LABORATORY DATA:  Thyroid panel within normal limits.  His anemia study showed his B12 low at 190.  His ferritin was high at 446.  Currently no other lab work tests are on the record.  MENTAL STATUS EXAMINATION:  On admission, elderly black male, balding, cooperative and pleasant, no abnormal movement tremors, normal gait, speech normal but slightly slurred.  Patient does not wear his dentures.  Mood was euthymic.  Affect anxious.  Thoughts  organized and goal directed.  No dangerous ideation, no paranoia.  Alert and oriented x 3 with good memory retention, recalling 2 out of 3 objects, decreased concentration, normal intelligence.  Insight good, judgment good.  ADMITTING DIAGNOSES: Axis I:     1. Alcohol depression.             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 Axis II:    No diagnosis. Axis III:   Diabetes mellitus type 2, hypertension, benign prostatic             hypertrophy. Axis IV:    Moderate. Axis V:     Global assessment of function on admission 40, highest past             is 60.  HOSPITAL COURSE:  The patient was admitted to the [facility] unit and to be placed on a detox protocol.  He was given phenobarbital protocol.  The Wellbutrin was decreased since it could increase risk of seizures while the patient is in detox.  Also check his fasting blood sugar as well as monitor his CBGs.  He was placed on an 1800 ADA Kcal diet, as well again as the phenobarbital protocol, and we continued his Glucophage 500 b.i.d., Altace 2.5 q.d., verapamil SR 240 mg q.d., HCTZ 12.5 q.d., Vioxx 12.5 t.i.d. with meals, doxazosin mesylate 8 mg  q.d., Glyburide 10 mg q.d., Advil 10 mg 2 tabs q.6h. p.r.n. pain, Wellbutrin SR 150 mg b.i.d., Tri-Cor 200 mg q.d.  On the second day of admission, we did decrease his Wellbutrin SR to 100 b.i.d., as well as monitoring his CBGs and requesting the physical examination and labs from [facility] Emergency Department.  We on the 8th day placed him on Seroquel 25 mg at h.s.  We also  on the 9th day increased his Seroquel to 50 mg h.s., increased his HCTZ to 25 mg daily.  While he was in the hospital, he did fairly well.  There were no signs or symptoms of withdrawal.  We continued the detox, and we were waiting for his pending laboratory work.  He was verbally committed to sobriety.  Laboratory work showed anemia that could be secondary to vitamin B12 deficiency, so we did  decide to start vitamin B12 and we increased the Seroquel.  On October 20, the patient felt like he could be discharged, going to [facility] using the [facility] program, but then he changed his mind, wanting something closer to his home.  Then again he changed his mind, wanting to attend to attend the [facility] clinic and decided that he would follow the outpatient CD program.  So therefore it was decided that he detoxed safely, we could manage him on an outpatient basis that was fairly structured.  CONDITION ON DISCHARGE:  Patient has been detoxed successfully, improvement in mood, sleep, appetite and alleviation of any  suicidal or homicidal ideations. He has gotten improvement in his energy.  DISPOSITION:  Patient discharged home.  FOLLOW UP:  The patient is to follow up at the [facility] CDIOP program, starting July 20, 2000 at 4 p.m.  He is also to follow up with his medical doctor to adjust his blood pressure medications and to look at his anemia, and also look at his follow up with Vitamin B12 injections and follow up with his diabetes.  He was told to stay on his 1800 ADA diet and also absolutely no alcohol.  DISCHARGE MEDICATIONS: 1. Wellbutrin SR 100 mg 1 b.i.d. 2. Seroquel 100 mg 1 tab at h.s.  Continue his medications ordered by his medical doctor: 1. HCTZ 25 mg 1 daily. 2. Glucophage 500 1 b.i.d. 3. Altace 2.5 1 daily. 4. Verapamil SR 240 mg 1 daily. 5. Vioxx 12.5 mg t.i.d. 6. Doxazosin mesylate 8 mg daily. 7. Tri-Cor 200 mg daily. 8. Vitamin B12 100 mcg injections daily x 5, then follow instructions of    his family doctor.  FINAL DIAGNOSIS: Axis I:     1. Depressive disorder not otherwise specified.             2. Alcohol dependence. Axis II:    No diagnosis. Axis III:   non-insulin-dependent diabetes mellitus, hypertension, benign             prostatic hypertrophy, vitamin B12 deficiency, anemia. Axis IV:    Mild. Axis V:     Current global assessment of  function at discharge 50, highest             past year 60.  DD:  09/14/00 TD:  09/14/00 Job: [id number]

## 2011-02-14 NOTE — Discharge Summary (Signed)
NAME:  Gerald Davenport, Gerald Davenport NO.:  [id number]   MEDICAL RECORD NO.:  [id number]                   PATIENT TYPE:  INP   LOCATION:  5156                                 FACILITY:  [facility]   PHYSICIAN:  Juluis Mire, P.A.              DATE OF BIRTH:  [birth year]   DATE OF ADMISSION:  07/07/2002  DATE OF DISCHARGE:  07/13/2002                                 DISCHARGE SUMMARY   REFERRING PHYSICIAN:  L. Lupe Carney, M.D.   ADMISSION DIAGNOSES:  1. Occluded right femoral-popliteal bypass graft with ischemic right lower     extremity, non-healing ulcer.  2. Hypertension.  3. Adult onset diabetes mellitus, non-insulin-dependent.  4. Hypercholesterolemia.  5. Benign prostatic hypertrophy.  6. Chronic obstructive pulmonary disease with ongoing tobacco use.   DISCHARGE DIAGNOSES:  1. Occluded right femoral-popliteal bypass graft with ischemic right lower     extremity, non-healing ulcer.  2. Hypertension.  3. Adult onset diabetes mellitus, non-insulin-dependent.  4. Hypercholesterolemia.  5. Benign prostatic hypertrophy.  6. Chronic obstructive pulmonary disease with ongoing tobacco use.   PROCEDURE:  Right below-the-knee amputation on July 08, 2002.   HISTORY OF PRESENT ILLNESS:  The patient is a 70 year old black male who  originally underwent a right femoral below-the-knee popliteal bypass graft  with a 6.0 mm Gore-Tex on Jan 31, 2000.  More recently he presented with an  occlusion of the graft on June 16, 2002, and underwent a thrombectomy  of his right femoral to popliteal bypass graft, as well as a thrombectomy of  the tibial vessels with a successful reopening of the graft.  He was placed  on Coumadin postoperatively, and was discontinued on June 27, 2002.  At  the time of his admission he has had a breakdown of the plantar surface of  his right foot.  He has significant pain in the right foot since his last  surgery.  Pain medications  help him, though it keeps him awake at night,  secondary to pain.  He has a plantar ulceration which has not improved, and  seems to have developed ischemic changes to his toes.  They are swollen and  tender.  He was seen in the CVTS Office today.  His graft was found to be  patent.  He has moderate Doppler signals at the anterior tibial region.  The  foot appears non-viable, despite good indices.  He presents now for a right  below-the-knee amputation on July 08, 2002.   MEDICATIONS ON ADMISSION:  1. Wellbutrin 150 mg p.o. b.i.d.  2. Metformin/hydrochlorothiazide 500 mg  two tab p.o. b.i.d.  3. Altace 5 mg q.d.  4. Verapamil SR 240 mg q.d.  5. Doxazosin 8 mg q.d.  6. Tri-Chlor 160 mg q.d.  7. Coumadin 5 mg q.d., on hold.  8. Catapres 0.1 mg p.o. b.i.d.  9. Seroquel 50 mg p.o. q.d.   PAST  MEDICAL HISTORY:  1. Peripheral vascular occlusive disease.  2. Hypertension.  3. Diabetes mellitus type 2.  4. Hypercholesterolemia.  5. Benign prostatic hypertrophy.  6. Chronic tobacco use, ongoing, with chronic obstructive pulmonary disease.  7. History of heavy alcohol use.  8. History of genital warts.   PROCEDURE:  Right below-the-knee amputation on July 08, 2002.   HISTORY:  The patient is a 70 year old black male who underwent a right  femoral below-the-knee popliteal bypass graft with a 6.0 mm Gore-Tex on Jan 31, 2000.  He returned with an occlusion of his graft on June 16, 2002,  and underwent a successful thrombectomy of his right femoral, along with his  tibial arteries.  He was subsequently discharged home, but returns now with  pain in his right foot.  He has significant pain since his last surgery.  He  has a plantar ulceration which is still present.  His skin is darkened.  His  fourth and fifth toes have become mottled.  He has rest pain.  It was Dr.  Antonietta Barcelona opinion that he should be admitted for an amputation.   ALLERGIES:  No known drug allergies.    HOSPITAL COURSE:  The patient was admitted.  His INR was still elevated.  He  was given one unit of fresh frozen plasma.  He was taken to the operating  room the next day, where he underwent a right below-the-knee amputation.  He  has made good progress.  He has had no postoperative complications.  He was  seen in consultation by rehab, and it was their opinion that the patient was  doing well.  He was anxious to go home.  Because of his excellent progress,  he could undergo therapy at home.  A home health physical therapist had been  requested.  He will continue to clean his wounds with plain soap and water,  and followed by Kerlix and an Ace.   DISCHARGE MEDICATIONS/INSTRUCTIONS:  He is to resume all of his preadmission  medications as before, except for the Coumadin.  He is also to withhold his  Glucophage if his CBG is less than 120.   DISPOSITION:  At this point his stump is healing nicely.  He is tolerating  the discomfort with oral medications.  He is working with PT and OT for  mobilization and ADLs.   DIET:  He is to maintain his diabetic diet.   WOUND CARE:  He is to clean his incision with plain soap and water.  To keep  a dressing and Ace on his stumps.    FOLLOW UP:  He is to call Dr. Clovis Riley for any medical followup.  He is to  return to see Dr. Arbie Cookey on Wednesday, July 27, 2002, at 1:20 p.m.   CONDITION ON DISCHARGE:  Improved.                                                 Juluis Mire, P.A.    JW/MEDQ  D:  07/12/2002  T:  07/12/2002  Job:  [id number]

## 2011-02-14 NOTE — Op Note (Signed)
   NAME:  Gerald Davenport, Gerald Davenport NO.:  [id number]   MEDICAL RECORD NO.:  [id number]                   PATIENT TYPE:  INP   LOCATION:  5156                                 FACILITY:  [facility]   PHYSICIAN:  Larina Earthly, M.D.                 DATE OF BIRTH:  [birth year]   DATE OF PROCEDURE:  07/08/2002  DATE OF DISCHARGE:                                 OPERATIVE REPORT   PREOPERATIVE DIAGNOSES:  Gangrene, left foot.   POSTOPERATIVE DIAGNOSES:  Gangrene, left foot.   PROCEDURE:  Right below-knee amputation.   SURGEON:  Larina Earthly, M.D.   ASSISTANT:  Lissa Merlin, P.A.   ANESTHESIA:  MAC.   COMPLICATIONS:  None.   DISPOSITION:  To recovery stable.   PROCEDURE IN DETAIL:  The patient was taken to the operating room and placed  supine.  The area of the right foot and leg were prepped and draped in the  usual sterile fashion.  Using a posterior-based muscle flap, an incision was  made several fingerbreadths below the tibial prominence and carried down  through the anterior tibial muscle bundles.  The anterior tibial artery was  occluded with hemostats and divided.  The gastrocnemius was left intact with  a posterior muscle flap and the soleus divided in line with the tibial  resection.  The popliteal artery was occluded with hemostats and divided.  The periosteum was elevated off the tibia, and the tibia was divided with  the Gigli saw.  The fibula was divided with bone shears.  The specimen was  passed off the field.  The vessels were ligated with 0 Vicryl ties.  Hemostasis was obtained with electrocautery, and the wounds were irrigated  with saline.  The posterior based fascia was sewn to the anterior fascia  with interrupted 0 figure-of-eight Vicryl sutures.  The skin was closed with  skin clips.  A sterile dressing was applied, and the patient was taken to  the recovery room in a stable condition.                                               Larina Earthly, M.D.    TFE/MEDQ  D:  07/08/2002  T:  07/08/2002  Job:  [id number]

## 2011-02-14 NOTE — Op Note (Signed)
[facility]  Patient:    Gerald Davenport, Gerald Davenport                          MRN: [id number] Proc. Date: 01/26/99 Adm. Date:  [id number] Attending:  Alyson Locket CC:         Dr. Theodoro Grist, Lyndee Leo D. Clovis Riley, MD                           Operative Report  PREOPERATIVE DIAGNOSIS:  Ischemic right foot.  POSTOPERATIVE DIAGNOSIS:  Ischemic right foot.  OPERATION PERFORMED:  Aortogram bilateral lower extremity run-off.  SURGEON:  Larina Earthly, M.D.  ANESTHESIA:  1% lidocaine local, 1 mg IV Versed sedation.  COMPLICATIONS:  None.  DISPOSITION:  To holding area stable.  DESCRIPTION OF PROCEDURE:  The patient was taken to the cardiac catheterization lab and placed in supine position where the area of both groins was prepped and draped in the usual sterile fashion.  Using local anesthesia and a single wall stick, he right common femoral artery was entered.  Initially a pigtail catheter was used and there was some difficulty in passing the catheter.  Hand injection was undertaken and this showed some extravasation outside the artery.  The needle was removed.  Pressure was held and another attempt at puncture was successful and a Wholey wire was passed up to the level of the superrenal aorta.  A 5 French sheath was passed over this and a pigtail catheter was then passed up to the level of the superrenal aorta as well.  AP injections were undertaken at the level of the aorta and this revealed a widely patent infrarenal abdominal aorta.  The patient had single widely patent large renal arteries.  The catheter was then withdrawn down to the aortic bifurcation and the AP projection was undertaken.  This revealed slight mild narrowing of the common iliac arteries bilaterally.  The external iliac arteries were widely patent.  Left leg run-off revealed a widely patent superficial femoral artery.  There was narrowing at the level of  the popliteal artery at the knee.  There was washout of contrast in the distal tibial just above the ankle.  The patient had normal three-vessel run-off at this level.  The pigtail catheter was then removed and right leg run-off was obtained via the right femoral sheath. his revealed widely patent common femoral artery.  There was focal stenosis of the proximal superficial femoral artery with the remaining portion of the artery being normal.  At the adductor canal the artery became quite small and was completely  occluded above the level of the popliteal artery of the knee.  There was irregularity of the popliteal artery at the knee level as well.  The bloody popliteal artery was patent but small.  There was occlusion of the posterior tibial artery just after its take-off with reconstitution of collaterals.  The perineal artery was a dominant run-off artery.  The anterior tibial artery was also patent. A right oblique injection was then undertaken as well for further definition of the tibial vessels.  Again, this revealed a small below knee popliteal artery with peroneal and anterior tibial run-off.  The anterior tibial was patent into the foot as the dorsalis pedis artery.  The patient tolerated the procedure without  complication and was transferred to the holding area  in stable condition.  FINDINGS: 1. No evidence of aortiliac occlusive disease. 2. 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 3. Peroneal and anterior tibial run-off into the foot. 4. Patent left superficial femoral artery with patent proximal tibial vessels. DD:  01/26/00 TD:  01/28/00 Job: [id number]

## 2011-02-14 NOTE — H&P (Signed)
NAME:  Gerald Davenport, Gerald Davenport NO.:  [id number]   MEDICAL RECORD NO.:  [id number]                   PATIENT TYPE:  INP   LOCATION:  5156                                 FACILITY:  [facility]   PHYSICIAN:  Larina Earthly, M.D.                 DATE OF BIRTH:  [birth year]   DATE OF ADMISSION:  07/07/2002  DATE OF DISCHARGE:                                HISTORY & PHYSICAL   PRIMARY CARE PHYSICIAN:  L. Lupe Carney, M.D.   HISTORY OF PRESENT ILLNESS:  The patient is a 70 year old African American  male with a history of infrainguinal arterial occlusive disease.  This  primarily effects his right leg.  He originally underwent a right femoral to  below-knee popliteal bypass with placement of a 6-mm Gore-Tex conduit on Jan 31, 2000.  More recently, he had presented with occlusion of this graft on  June 16, 2002.  He underwent thrombectomy of this right femoral to  popliteal bypass, as well as thrombectomy of his tibial vessel with  successful reopening of the graft.  He was placed on Coumadin  postoperatively and discontinued on June 27, 2002.  At the time of this  admission, he had a breakdown on the plantar surface of his right foot.  He  says he has had significant pain in the right foot since this last surgery.  Pain medication helps him but when he awakens at night the pain is terrible.  His plantar ulceration is still present.  In addition, he has been  developing severe ischemic changes to his toes.  They are swollen, tender,  and the skin is darkened.  His wife mentions that his fourth and fifth toes  have become dead looking in the last two days.  He was seen at the office  [facility] today.  His graft was found  to be patent.  He has moderate Doppler signals at the anterior tibial and  posterior tibial region at the right ankle.  The foot looks definitely  nonviable despite this.  He presents for right below-knee  amputation in the  morning of October 10, Dr. Gretta Began.   ALLERGIES:  No known drug allergies.   MEDICATIONS:  1. Wellbutrin SR 150 mg p.o. b.i.d.  2. Metformin/hydrochlorothiazide 500 mg 2 tablets p.o. b.i.d.  3. Altace 5 mg daily.  4. Verapamil SR 240 mg daily.  5. Doxazosin 8 mg daily.  6. Tricor 160 mg daily.  7. Coumadin 5 mg daily.  Currently on hold.  8. Glyburide 5 mg 2 tablets daily.  9. Catapres 0.1 mg p.o. b.i.d.  10.      Seroquel 50 mg at bedtime.   PAST MEDICAL HISTORY:  1. Peripheral vascular occlusive disease as outlined in history of present     illness.  2. Hypertension.  3. Type 2 diabetes mellitus.  4.  Hypercholesterolemia.  5. Benign prostatic hypertrophy.  6. Chronic tobacco use.  7. Remote history of heavy ethanol use.  8. Genital warts.   PAST SURGICAL HISTORY:  Right femoral to below-knee popliteal bypass with  Gore-Tex conduit Jan 31, 2000.   SOCIAL HISTORY:  He is retired.  Worked as a Arboriculturist.  He has been married  for the past 37 years.  He has a supportive spouse and two children.  He  quit drinking alcoholic beverages two years ago.  He still smokes 1-1/2  packs per day.   FAMILY HISTORY:  Mother died at age 75 of a stroke.  She also had  hypertension.  Father died at age 24.  He had diabetes and infrainguinal  arterial occlusive disease.  One brother died at age 57 of kidney disease.  He also had PVOD.   REVIEW OF SYSTEMS:  The patient denies any pulmonary problems such as  chronic cough, shortness of breath, dyspnea on exertion, hemoptysis.  He has  no extensive previous cardiac history.  No history of myocardial infarction  or cardiac dysrhythmias.  No congestive heart failure.  As far as GI  symptoms, he has never had a gastrointestinal bleed.  No history of reflux.  GENERAL:  He has no fever or chills.  No recent weight gain or loss.  EXTREMITIES:  The right foot is in constant pain.  It is ischemic.  The pain  at times stretches  to reach the right knee.   PHYSICAL EXAMINATION:  GENERAL:  This is an alert and oriented male in  moderate distress from rest pain in the right foot.  VITAL SIGNS:  Temperature 98.6, blood pressure 175/85, pulse 83 and regular,  respirations 20.  HEENT:  Eyes:  Pupils are equal, round, and reactive to light.  Extraocular  movements intact.  The oropharynx shows no evidence of lesion or erythema.  He is almost edentulous.  He wears a lower denture and has very poor  dentition.  NECK:  No jugular venous distention, no carotid bruits.  LUNGS:  Relatively clear to auscultation and percussion bilaterally.  HEART:  Regular rate and rhythm without murmur.  ABDOMEN:  Mildly obese.  Bowel sounds are present.  Regular bowel movements.  No nausea or vomiting.  EXTREMITIES:  Radial pulses are 4/4 bilaterally.  The left lower extremity  is well perfused and has a palpable left dorsalis pedis pulse.  The right  lower extremity is 3/4 femoral pulses.  The right dorsalis pedis pulse is  nonpalpable.  The right anterior tibial is moderate by Doppler and the right  posterior tibial is a soft Doppler.  The right popliteal area has strong  Doppler signal.  The right foot has a plantar ulcer which has not healed  since his last hospitalization.  The right hallux is necrotic, darkened, and  swollen.  There is a rubris area stretching along the medial aspect of the  right foot from the great toe to his arch.  It has been there since the  last surgery.  Toes 2-5 on the right foot are swollen and ischemic.  NEUROLOGIC:  Grossly intact.   IMPRESSION:  Ischemic right foot, nonviable despite patent right femoral to  popliteal bypass.  The right foot is verging on gangrene.   PLAN:  Right below-knee amputation in the morning of July 08, 2002, Dr.  Gretta Began.      Maple Mirza, P.A.  Larina Earthly, M.D.    GM/MEDQ  D:  07/07/2002  T:  07/08/2002  Job:  [id number]
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
## 2011-02-14 NOTE — Discharge Summary (Signed)
NAME:  JAMA, KRICHBAUM NO.:  [id number]   MEDICAL RECORD NO.:  [id number]                   PATIENT TYPE:  INP   LOCATION:  5156                                 FACILITY:  [facility]   PHYSICIAN:  Balinda Quails, M.D.                 DATE OF BIRTH:  October 11, 1940   DATE OF ADMISSION:  06/16/2002  DATE OF DISCHARGE:  06/24/2002                                 DISCHARGE SUMMARY   FINAL DIAGNOSES:  1. Thrombosed right femoral-to-below-knee-popliteal bypass graft.  2. Severe peripheral vascular disease.  3. Adult-onset diabetes mellitus.  4. Hypercholesterolemia.  5. Smoking.  6. Benign prostatic hypertrophy.  7. Genital warts.  8. Possible interstitial lung disease per previous records.   PROCEDURES:  Thrombectomy of right femoral and tibial vessels, thrombectomy  of right femoral-to-popliteal bypass graft with Dacron patch angioplasty on  06/16/2002 with intraoperative angiogram.   HOSPITAL COURSE:  The patient is a 70 year old black male with a right lower  extremity femoral-to-popliteal bypass graft who presented with an acutely  ischemic right lower extremity.  He was found to have a thrombosed bypass  graft. He was admitted to the hospital and underwent thrombectomy on  06/16/2002.  Postoperatively, his right foot remained warm and well diffused.  His bypass graft remained patent.  He as put on heparin and Coumadin.  He  had no postoperative complications.  After waiting a few days for his INR to  become therapeutic, he was discharged home on 06/24/2002 with an INR of 1.8.  At that time, he was afebrile; vital signs were stable.  His incisions were  healing well.  Physical exam was satisfactory.   DISCHARGE MEDICATIONS:  1. Coumadin 5 mg tablets taken daily as directed.  2. HCTZ 25 mg 1 tablet daily.  3. Verapamil 240 mg 1 tablet daily.  4. Glyburide 10 mg 1 tablet daily.  5. TriCor 160 mg one daily.  6. Cardura 8 mg 1 tablet daily.  7. Wellbutrin  150 mg p.o. b.i.d.  8. Altace 5 mg p.o. q.d.  9. Catapres 0.1 mg 1 tablet twice a day.  10.      Seroquel 50 mg p.o. q.h.s.  11.      Percocet 1 to 2 every 4 to 6 hours p.r.n. for pain.   SPECIAL INSTRUCTIONS:  He was told to avoid driving,heavy lifting, strenuous  activity, working.  He was told he could shower.   CONDITION ON DISCHARGE:  Stable.   FOLLOW UP:  1. Wound staples to be taken out by the home health care nurse on 07/01/2002.  2. He was told to go to Dr. Quita Skye office on Monday, September 29, for     INR draw.  3. Dr. Elyn Peers, 07/08/2002 at 2 p.m.  Note:  Dr. Elyn Peers is no longer with the     practice, and a future followup will be with  Dr. Liliane Bade.     Lissa Merlin, Alver Sorrow, M.D.    Alwyn Ren  D:  08/26/2002  T:  08/26/2002  Job:  [id number]   cc:   Balinda Quails, M.D.  [address]  [place]  [place] [address]  Fax: [phone]   L. Lupe Carney, M.D.  [address] [place]  [place] [address]  Fax: [phone]

## 2011-02-14 NOTE — Discharge Summary (Signed)
[facility]  Patient:    Gerald Davenport, Gerald Davenport                          MRN: [id number] Adm. Date:  [id number] Disc. Date: [id number] Attending:  Alyson Locket Dictator:   Lissa Merlin, P.A.-C. CCAbran Cantor. Clovis Riley, M.D.             Elige Radon. Maisie Fus, M.D.                           Discharge Summary  DATE OF BIRTH:  1941/03/27  ADMISSION DIAGNOSIS:  Right foot ischemia with rest pain.  PAST MEDICAL HISTORY:  1. Adult onset diabetes mellitus.  2. Peripheral vascular occlusive disease with claudication symptoms.  3. Questionable interstitial lung disease.  4. Questionable benign prostatic hypertrophy.  5. Hypercholesterolemia.  6. "Genital warts."  7. Tobacco abuse.  8. ETOH abuse.  9. Neuroma, third metatarsal, right foot. 10. Hypertension. 11. No history of cardiac disease.  DISCHARGE DIAGNOSES:  Status post right femoral below-the-knee popliteal bypass.  PROCEDURE:  Right femoral to below-the-knee popliteal bypass graft with 6.0 mm Gore-Tex and intraoperative angiogram on Jan 31, 2000, by Dr. Kristen Loader. Early.  HISTORY OF PRESENT ILLNESS:  Gerald Davenport is a 70 year old black male who was referred by Dr. Theodoro Grist for evaluation of right foot pain, apparently combined with rest pain and night time pain.  After seeing Gerald Davenport on April 72, 2001, and examining him, Dr. Arbie Cookey recommended an arteriography to determine the level of his apparent arterial occlusion.  The arteriogram confirmed the presence of right femoral/popliteal occlusive disease.  Dopplers at the office revealed right ABI of 0.40 and left ABI of more than 1.0.  Because of these findings, Dr. Arbie Cookey recommended a right femoral/popliteal bypass graft for resolution of the symptoms and revascularization of the area.  Details, risks, benefits, and alternatives f surgery were discussed with Gerald Davenport, and it was agreed to proceed.  HOSPITAL COURSE:  He was brought into the  hospital for the procedure on Jan 31, 2000.  There were no obvious complications, and he was transferred to the recovery room in stable condition.  On postoperative day number one the vital signs were stable.  Saturation was 99% on 2 L.  His foot was warm and well-perfused.  Laboratory work was satisfactory. e was given liquor or choice 2 ounces q.4h. in light of his alcohol abuse.  On postoperative day number two Gerald Davenport was comfortable.  His vital signs were stable.  His incision appeared to be healing well.  He had a palpable popliteal and dorsalis pedis pulse.  On postoperative day number three Gerald Davenport has no complaints.  He is ambulatory.  Bowel and bladder are well.  He is afebrile, and his vital signs are stable.  His graft is patent.  He has 2+ dorsalis pedis pulses.  DISPOSITION:  He was deemed suitable for discharge home, and was discharged.  DISCHARGE MEDICATIONS:  Gerald Davenport will resume his  home medications, with the addition of Dalmane 15 mg one p.o. q.h.s. p.r.n. for sleep #30, no refills; and  Tylox one to two p.o. q.4-6h. p.r.n. pain #20, no refills.  ALLERGIES:  No known drug allergies.  FOLLOWUP:  Gerald Davenport will follow up at the [facility] office for staple removal on Monday, Feb 10, 2000, at 10  a.m.  The appointment will be made at that time for him to ee Dr. Arbie Cookey in around two to three weeks.  SPECIAL INSTRUCTIONS:  Gerald Davenport is instructed to keep his incision area clean and dry, and to use soap and water only on the wound.  He is told to call the office if he notices anything unusual, with regard to the wound, such as discharge, bleeding, swelling, pain, or numbness.  He is also told to call if he has any questions whatsoever.  He is told to avoid strenuous activity, no driving. DD:  02/03/00 TD:  02/05/00 Job: [id number]

## 2011-02-14 NOTE — Op Note (Signed)
[facility]  Patient:    Gerald Davenport, Gerald Davenport                          MRN: [id number] Proc. Date: 01/31/00 Adm. Date:  [id number] Disc. Date: [id number] Attending:  Alyson Locket CC:         Dr. Theodoro Grist             Dr. Lupe Carney                           Operative Report  PREOPERATIVE DIAGNOSIS:       Rest pain with right foot ischemia.  POSTOPERATIVE DIAGNOSIS:      Rest pain with right foot ischemia.  PROCEDURE:                    Right femoral to below-knee popliteal bypass                               with 6 mm Gore-Tex graft.  SURGEON:                      Larina Earthly, M.D.  ASSISTANTS:                   Di Kindle. Edilia Bo, M.D., Lissa Merlin,                               P.A.-C.  ANESTHESIA:                   Epidural.  COMPLICATIONS:                None.  DISPOSITION:                  To recovery room stable.  DESCRIPTION OF PROCEDURE:  The patient was taken to the operating room and placed on the operating table.  The area of the right groin and right leg were prepped and draped in a sterile fashion.   An incision made over the femoral pulse and carried down to isolate the common, superficial femoral and profunda femoris arteries.  Next, the saphenous vein was identified at the saphenofemoral junction and was harvested toward the thigh.  Tributary veins at the saphenofemoral junction were ligated and divided.  The vein divided several times early in the thigh and was small throughout its course.  The vein was harvested from the level of the groin to the ankle.  This was dilated but was extremely sclerotic throughout its course aside from the proximal approximately 5-7 cm, and therefore was felt to be completely unusable for bypass.  For this reason, a decision was made to place a prosthetic below-knee graft.  Through the distal vein harvest incision below the knee, the below-knee popliteal artery was exposed and was of good  caliber.  A tunnel was created from the below-knee popliteal space to the groin.  A 6 mm Gore-Tex was brought through the graft tunnel.  The patient was given 7000 units of intravenous heparin.  After adequate circulation time, the common and superficial femoral and profunda femoris arteries were occluded.  The common femoral artery was opened with an 11 blade, _____, and Potts scissors.  The graft was spatulated and sewn end-to-side to  the artery with a running 6-0 Prolene suture.  Clamps were removed and a good anastomosis was encountered. The graft was flushed with heparinized saline and re-occluded.  Next, the popliteal artery was occluded proximally and opened with the 11 blade, _____, and Potts scissors.  The graft was cut to an appropriate length and was sewn end-to-side to the artery with a running 6-0 Prolene suture.  Clamps were removed, and an excellent signal was noted in the foot.  The patient then underwent an intraoperative arteriogram using a 21 gauge butterfly needle through the graft.  This revealed a technically good anastomosis to the below-knee popliteal artery and normal three-vessel runoff.  The patient was given 50 mg of protamine to reverse the heparin.  The wound was irrigated with saline, hemostasis with electrocautery.  The wound was closed with 3-0 Vicryl in the subcutaneous tissue, and Steri-Strips were applied. DD:  01/31/00 TD:  02/04/00 Job: [id number]
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
## 2011-05-07 ENCOUNTER — Ambulatory Visit: Payer: Medicare Other

## 2011-05-08 ENCOUNTER — Encounter: Payer: Self-pay | Admitting: Vascular Surgery

## 2011-05-14 ENCOUNTER — Encounter: Payer: Self-pay | Admitting: Vascular Surgery

## 2011-05-14 ENCOUNTER — Ambulatory Visit (INDEPENDENT_AMBULATORY_CARE_PROVIDER_SITE_OTHER): Payer: Medicare Other | Admitting: Vascular Surgery

## 2011-05-14 VITALS — BP 216/78 | HR 91 | Temp 97.7°F | Ht 69.0 in | Wt 172.0 lb

## 2011-05-14 DIAGNOSIS — Z48812 Encounter for surgical aftercare following surgery on the circulatory system: Secondary | ICD-10-CM

## 2011-05-14 DIAGNOSIS — Z4889 Encounter for other specified surgical aftercare: Secondary | ICD-10-CM

## 2011-05-14 DIAGNOSIS — T82598A Other mechanical complication of other cardiac and vascular devices and implants, initial encounter: Secondary | ICD-10-CM

## 2011-05-14 DIAGNOSIS — N186 End stage renal disease: Secondary | ICD-10-CM

## 2011-05-14 NOTE — Progress Notes (Signed)
Subjective:     Patient ID: Gerald Davenport, male   DOB: 11/28/1940, 70 y.o.   MRN: [id number]  HPI  This is a pleasant 70 year old gentleman who had a left brachiocephalic AV fistula placedby Dr. Leonides Sake on 11/04/2010. He later had some competing branches ligated area was last seen in our office on 12/27/2010 at that time the fistula was maturing nicely and he was to be seen as needed. He was referred by Dr. Camille Bal to evaluate the fistula as it does not appear to be maturing adequately.  Patient has not had any recent uremic symptoms. Specifically he denies nausea, vomiting, palpitations, fatigue, or anorexia. History  Substance Use Topics  . Smoking status: Current Everyday Smoker -- 1.0 packs/day for 50 years    Types: Cigarettes  . Smokeless tobacco: Not on file  . Alcohol Use: No   Review of Systems  Constitutional: Negative for fever and chills.  Respiratory: Negative for chest tightness and shortness of breath.   Cardiovascular: Negative for chest pain and palpitations.       Objective:   Physical Exam  Constitutional: He is oriented to person, place, and time.  Neck: Neck supple. No JVD present. No thyromegaly present.  Cardiovascular: Normal rate, regular rhythm and normal heart sounds.  Exam reveals no friction rub.   No murmur heard. Pulmonary/Chest: Breath sounds normal. He has no wheezes. He has no rales.  Abdominal: Soft. Bowel sounds are normal. There is no tenderness.  Musculoskeletal: He exhibits no edema.  Lymphadenopathy:    He has no cervical adenopathy.  Neurological: He is alert and oriented to person, place, and time. He has normal strength. No sensory deficit.  Skin: No lesion and no rash noted.  The left upper arm AV fistula has a weak thrill in the upper arm. He has a palpable left radial pulse. Filed Vitals:   05/14/11 1436  BP: 216/78  Pulse: 91  Temp: 97.7 F (36.5 C)    Body mass index is 25.40 kg/(m^2).  I have independently  interpreted in his venous duplex of his left upper arm AV fistula today. He has multiple areas of significant stenosis within the fistuIf the cephalic vein fistula is not salvageable it appears that he could potentially have a basilic vein transposition in the left arm based on his previous vein mapping.la. The vein itself appears fairly small with diameters ranging from .18 to .28 cm.     Assessment:     I have recommended that we proceed with a fistulogram. He is not yet on dialysis so we will minimize the amount of contrast used. If the cephalic vein fistula is not salvageable that he could potentially have a basilic vein transposition in the left arm based on his previous vein mapping.    Plan:     This fistulogram has been scheduled for 05/26/2011. We'll make further recommendations pending these results. I've explained that I do find a stenosis amenable to angioplasty this could be done at the same time.

## 2011-05-23 NOTE — Procedures (Unsigned)
VASCULAR LAB EXAM  INDICATION:  Nonmaturing arteriovenous fistula.  HISTORY: Diabetes:  Yes. Cardiac:  No. Hypertension:  Yes. Smoking:  Currently. Chronic kidney disease, stage IV.  EXAM:  Left upper extremity arteriovenous fistula duplex.  IMPRESSION: 1. Elevated velocities present at the distal upper arm, mid/distal     upper arm, and mid upper arm segments of the venous outflow,     suggestive of hemodynamically significant stenosis. 2. The left cephalic vein at the subclavian vein confluence appears     tortuous. 3. The left cephalic vein depth and diameters are as noted on the     worksheet and appear small in caliber.     ___________________________________________ Di Kindle. Edilia Bo, M.D.  SH/MEDQ  D:  05/14/2011  T:  05/14/2011  Job:  [id number]

## 2011-05-26 ENCOUNTER — Ambulatory Visit (HOSPITAL_COMMUNITY)
Admission: RE | Admit: 2011-05-26 | Discharge: 2011-05-26 | Disposition: A | Discharge status: Home / Self Care | Payer: Medicare Other | Source: Ambulatory Visit | Attending: Vascular Surgery | Admitting: Vascular Surgery

## 2011-05-26 DIAGNOSIS — T82898A Other specified complication of vascular prosthetic devices, implants and grafts, initial encounter: Secondary | ICD-10-CM | POA: Insufficient documentation

## 2011-05-26 DIAGNOSIS — Z538 Procedure and treatment not carried out for other reasons: Secondary | ICD-10-CM | POA: Insufficient documentation

## 2011-05-26 DIAGNOSIS — Y849 Medical procedure, unspecified as the cause of abnormal reaction of the patient, or of later complication, without mention of misadventure at the time of the procedure: Secondary | ICD-10-CM | POA: Insufficient documentation

## 2011-05-26 LAB — POCT I-STAT, CHEM 8
Creatinine, Ser: 4.7 mg/dL — ABNORMAL HIGH (ref 0.50–1.35)
Glucose, Bld: 413 mg/dL — ABNORMAL HIGH (ref 70–99)
Hemoglobin: 12.2 g/dL — ABNORMAL LOW (ref 13.0–17.0)
Potassium: 3.1 mEq/L — ABNORMAL LOW (ref 3.5–5.1)

## 2011-06-16 ENCOUNTER — Encounter (HOSPITAL_COMMUNITY)
Admission: RE | Admit: 2011-06-16 | Discharge: 2011-06-16 | Disposition: A | Discharge status: Home / Self Care | Payer: Medicare Other | Source: Ambulatory Visit | Attending: Nephrology | Admitting: Nephrology

## 2011-06-16 DIAGNOSIS — D509 Iron deficiency anemia, unspecified: Secondary | ICD-10-CM | POA: Insufficient documentation

## 2011-06-18 ENCOUNTER — Encounter (HOSPITAL_COMMUNITY)
Admission: RE | Admit: 2011-06-18 | Discharge: 2011-06-18 | Disposition: A | Discharge status: Home / Self Care | Payer: Medicare Other | Source: Ambulatory Visit | Attending: Vascular Surgery | Admitting: Vascular Surgery

## 2011-06-24 ENCOUNTER — Ambulatory Visit (HOSPITAL_COMMUNITY)
Admission: RE | Admit: 2011-06-24 | Discharge: 2011-06-24 | Disposition: A | Discharge status: Home / Self Care | Payer: Medicare Other | Source: Ambulatory Visit | Attending: Vascular Surgery | Admitting: Vascular Surgery

## 2011-06-24 DIAGNOSIS — Z01812 Encounter for preprocedural laboratory examination: Secondary | ICD-10-CM | POA: Insufficient documentation

## 2011-06-24 DIAGNOSIS — N185 Chronic kidney disease, stage 5: Secondary | ICD-10-CM

## 2011-06-24 LAB — POCT I-STAT 4, (NA,K, GLUC, HGB,HCT)
Glucose, Bld: 287 mg/dL — ABNORMAL HIGH (ref 70–99)
HCT: 33 % — ABNORMAL LOW (ref 39.0–52.0)
Sodium: 135 mEq/L (ref 135–145)

## 2011-06-24 LAB — GLUCOSE, CAPILLARY: Glucose-Capillary: 312 mg/dL — ABNORMAL HIGH (ref 70–99)

## 2011-06-24 NOTE — Op Note (Signed)
  NAME:  Gerald Davenport, Gerald Davenport NO.:  [id number]  MEDICAL RECORD NO.:  [id number]  LOCATION:  [facility]SC                         FACILITY:  [facility]  PHYSICIAN:  Di Kindle. Edilia Bo, M.D.DATE OF BIRTH:  17-Dec-1940  DATE OF PROCEDURE:  06/24/2011 DATE OF DISCHARGE:                              OPERATIVE REPORT   PREOPERATIVE DIAGNOSIS:  Chronic kidney disease, stage V.  POSTOPERATIVE DIAGNOSIS:  Chronic kidney disease, stage V.  PROCEDURE:  Left Basilic vein transposition.  SURGEON:  Di Kindle. Edilia Bo, MD  ASSISTANT:  Newton Pigg, PA  ANESTHESIA:  General.  TECHNIQUE:  The patient was taken to the operating room and received a general anesthetic.  Using ultrasound scanner, I interrogated the basilic vein and it appeared to empty into the brachial vein in the mid to upper arm.  Given my concern for adequate length, I therefore elected to dissect the vein down to the proximal third of the forearm.  Using four incisions along the medial aspect of the left arm, the basilic vein was harvested with branches divided between clips and 3-0 silk ties.  It then emptied into the brachial system.  In order to get adequate length, I ligated this junction allowing further mobilization of the vein proximally.  Separate longitudinal incision was made over the brachial artery distal to the previous brachiocephalic anastomosis.  Here the brachial artery was dissected free and good pulse.  The vein was irrigated up nicely with heparinized saline and it was tunneled superficially for anastomosis to the brachial artery.  The patient was heparinized.  The brachial artery was clipped proximally and distally and a longitudinal arteriotomy was made.  The vein was sewn end to side to the cystic artery using continuous 6-0 Prolene suture.  At the completion, there was an excellent thrill in the fistula.  Hemostasis was obtained in the wounds.  The wounds were all closed with deep  layer of 3-0 Vicryl and the skin was closed with 4-0 Vicryl.  Sterile dressing was applied.  The patient tolerated the procedure well and was transferred to the recovery room in stable condition.  All needle and sponge counts were correct.     Di Kindle. Edilia Bo, M.D.     CSD/MEDQ  D:  06/24/2011  T:  06/24/2011  Job:  [id number]  Electronically Signed by Waverly Ferrari M.D. on 06/24/2011 01:58:55 PM

## 2011-08-05 ENCOUNTER — Encounter: Payer: Self-pay | Admitting: Vascular Surgery

## 2011-08-06 ENCOUNTER — Encounter: Payer: Self-pay | Admitting: Vascular Surgery

## 2011-08-06 ENCOUNTER — Ambulatory Visit (INDEPENDENT_AMBULATORY_CARE_PROVIDER_SITE_OTHER): Payer: Medicare Other | Admitting: Vascular Surgery

## 2011-08-06 DIAGNOSIS — E119 Type 2 diabetes mellitus without complications: Secondary | ICD-10-CM

## 2011-08-06 DIAGNOSIS — N189 Chronic kidney disease, unspecified: Secondary | ICD-10-CM | POA: Insufficient documentation

## 2011-08-06 DIAGNOSIS — E1122 Type 2 diabetes mellitus with diabetic chronic kidney disease: Secondary | ICD-10-CM | POA: Insufficient documentation

## 2011-08-06 NOTE — Progress Notes (Signed)
[facility]  Patient name: Gerald Davenport MRN: [id number] DOB: 1941/07/05 Sex: male  CC: followup of left basilic vein transposition  HPI: Gerald Davenport is a 70 y.o. male who underwent a right radiocephalic AV fistula on 07/30/2010. This failed and he had a right brachiocephalic AV fistula on 09/02/2010. This too failed. He then had a left brachiocephalic AV fistula in 11/04/2010. This also failed. I then attempted a basilic vein transposition in the left arm on 06/24/2011. He comes in today to have this checked. He has had no pain in the arm and no recent uremic symptoms.  Past Medical History  Diagnosis Date  . Hypertension   . Hyperlipidemia   . Chronic kidney disease   . Diabetes mellitus   . Cancer     Prostate cancer treated with radiation therapy  . Thyroid disease     hyperparathyroidism    Family History  Problem Relation Age of Onset  . Heart disease Mother   . Diabetes Mother   . Hypertension Mother   . Stroke Mother   . Heart disease Father   . Diabetes Father   . Heart disease Sister   . Diabetes Brother     SOCIAL HISTORY: History  Substance Use Topics  . Smoking status: Current Everyday Smoker -- 1.0 packs/day for 50 years    Types: Cigarettes  . Smokeless tobacco: Not on file  . Alcohol Use: No    No Known Allergies  Current Outpatient Prescriptions  Medication Sig Dispense Refill  . aspirin 81 MG tablet Take 81 mg by mouth daily.        Marland Kitchen buPROPion (ZYBAN) 150 MG 12 hr tablet Take 150 mg by mouth 2 (two) times daily.        . Carboxymethylcellul-Glycerin (OPTIVE) 0.5-0.9 % SOLN Apply to eye as needed.        . cloNIDine (CATAPRES) 0.2 MG tablet Take 0.2 mg by mouth 2 (two) times daily.        Marland Kitchen doxazosin (CARDURA) 8 MG tablet Take 8 mg by mouth at bedtime.        . furosemide (LASIX) 40 MG tablet Take 40 mg by mouth 2 (two) times daily.        Marland Kitchen glyBURIDE (DIABETA) 5 MG tablet Take 5 mg by mouth 2 (two) times daily with a  meal.        . METFORMIN HCL PO Take 500 mg by mouth 2 (two) times daily.        Marland Kitchen oxycodone (OXY-IR) 5 MG capsule Take 5 mg by mouth every 4 (four) hours as needed.        Marland Kitchen QUEtiapine (SEROQUEL) 100 MG tablet Take 100 mg by mouth at bedtime.        Marland Kitchen RAMIPRIL PO Take 20 mg by mouth daily.        . simvastatin (ZOCOR) 20 MG tablet Take 20 mg by mouth at bedtime.        . Tamsulosin HCl (FLOMAX) 0.4 MG CAPS Take 0.4 mg by mouth.        . telmisartan-hydrochlorothiazide (MICARDIS HCT) 80-12.5 MG per tablet Take 1 tablet by mouth daily.        . calcitRIOL (ROCALTROL) 0.25 MCG capsule Take 0.25 mcg by mouth every Monday, Wednesday, and Friday.          REVIEW OF SYSTEMS: Arly.Keller ] denotes positive finding; [  ] denotes negative finding CARDIOVASCULAR:  [ ]  chest pain   [ ]   chest pressure   [ ]  palpitations   [ ]  orthopnea   [ ]  dyspnea on exertion   [ ]  claudication   [ ]  rest pain   [ ]  DVT   [ ]  phlebitis PULMONARY:   [ ]  productive cough   [ ]  asthma   [ ]  wheezing CONSTITUTIONAL:  [ ]  fever   [ ]  chills  PHYSICAL EXAM: Filed Vitals:   08/06/11 1345  BP: 195/82  Pulse: 92  Resp: 20  Height: 5\' 9"  (1.753 m)  Weight: 174 lb (78.926 kg)  SpO2: 98%   Body mass index is 25.70 kg/(m^2). GENERAL: The patient is a well-nourished male, in no acute distress. The vital signs are documented above. CARDIOVASCULAR: There is a regular rate and rhythm without significant murmur appreciated.  PULMONARY: There is good air exchange bilaterally without wheezing or rales. His left basilic vein transposition is occluded. Her is no bruit or thrill.  DATA:  I did interrogate the right basilic vein myself in the office today and this vein is very small I did not think it is usable for a basilic vein transposition on the right.  MEDICAL ISSUES: Unfortunately I think this patient will require an AV graft. As he is not yet on dialysis I will await till he is closer to needing dialysis before placing the graft  the lower the risk of developing intimal hyperplasia while the graft is not being used. He states he scheduled to see Dr.Dunham in the near future. We'll be happy to proceed with placement of an AV graft and if it is felt that he will need dialysis in the near future.  DICKSON,CHRISTOPHER S [facility] [place] Office: [phone]

## 2011-08-19 ENCOUNTER — Other Ambulatory Visit (HOSPITAL_COMMUNITY): Payer: Self-pay | Admitting: *Deleted

## 2011-08-25 ENCOUNTER — Encounter (HOSPITAL_COMMUNITY)
Admission: RE | Admit: 2011-08-25 | Discharge: 2011-08-25 | Disposition: A | Discharge status: Home / Self Care | Payer: Medicare Other | Source: Ambulatory Visit | Attending: Nephrology | Admitting: Nephrology

## 2011-08-25 DIAGNOSIS — D509 Iron deficiency anemia, unspecified: Secondary | ICD-10-CM | POA: Insufficient documentation

## 2011-08-25 MED ORDER — FERUMOXYTOL INJECTION 510 MG/17 ML
INTRAVENOUS | Status: AC
  Administered 2011-08-25: 510 mg via INTRAVENOUS
  Filled 2011-08-25: qty 17

## 2011-08-25 MED ORDER — FERUMOXYTOL INJECTION 510 MG/17 ML
510.0000 mg | Freq: Once | INTRAVENOUS | Status: AC
  Administered 2011-08-25: 510 mg via INTRAVENOUS

## 2011-11-14 ENCOUNTER — Other Ambulatory Visit: Payer: Self-pay | Admitting: *Deleted

## 2011-11-14 ENCOUNTER — Encounter (HOSPITAL_COMMUNITY): Payer: Self-pay | Admitting: Respiratory Therapy

## 2011-11-19 ENCOUNTER — Encounter (HOSPITAL_COMMUNITY): Payer: Self-pay | Admitting: *Deleted

## 2011-11-20 ENCOUNTER — Encounter (HOSPITAL_COMMUNITY)
Admission: RE | Disposition: A | Discharge status: Home / Self Care | Payer: Self-pay | Source: Ambulatory Visit | Attending: Vascular Surgery

## 2011-11-20 ENCOUNTER — Encounter (HOSPITAL_COMMUNITY): Payer: Self-pay

## 2011-11-20 ENCOUNTER — Ambulatory Visit (HOSPITAL_COMMUNITY)
Admission: RE | Admit: 2011-11-20 | Discharge: 2011-11-20 | Disposition: A | Discharge status: Home / Self Care | Payer: Medicare Other | Source: Ambulatory Visit | Attending: Vascular Surgery | Admitting: Vascular Surgery

## 2011-11-20 ENCOUNTER — Ambulatory Visit (HOSPITAL_COMMUNITY): Payer: Medicare Other

## 2011-11-20 DIAGNOSIS — D649 Anemia, unspecified: Secondary | ICD-10-CM | POA: Insufficient documentation

## 2011-11-20 DIAGNOSIS — K219 Gastro-esophageal reflux disease without esophagitis: Secondary | ICD-10-CM | POA: Insufficient documentation

## 2011-11-20 DIAGNOSIS — N186 End stage renal disease: Secondary | ICD-10-CM

## 2011-11-20 DIAGNOSIS — E109 Type 1 diabetes mellitus without complications: Secondary | ICD-10-CM | POA: Insufficient documentation

## 2011-11-20 DIAGNOSIS — K08109 Complete loss of teeth, unspecified cause, unspecified class: Secondary | ICD-10-CM | POA: Insufficient documentation

## 2011-11-20 DIAGNOSIS — Z794 Long term (current) use of insulin: Secondary | ICD-10-CM | POA: Insufficient documentation

## 2011-11-20 DIAGNOSIS — I129 Hypertensive chronic kidney disease with stage 1 through stage 4 chronic kidney disease, or unspecified chronic kidney disease: Secondary | ICD-10-CM | POA: Insufficient documentation

## 2011-11-20 DIAGNOSIS — Z8546 Personal history of malignant neoplasm of prostate: Secondary | ICD-10-CM | POA: Insufficient documentation

## 2011-11-20 DIAGNOSIS — E785 Hyperlipidemia, unspecified: Secondary | ICD-10-CM | POA: Insufficient documentation

## 2011-11-20 DIAGNOSIS — N189 Chronic kidney disease, unspecified: Secondary | ICD-10-CM

## 2011-11-20 HISTORY — DX: Anemia, unspecified: D64.9

## 2011-11-20 HISTORY — PX: AV FISTULA PLACEMENT: SHX1204

## 2011-11-20 LAB — POCT I-STAT 4, (NA,K, GLUC, HGB,HCT)
Glucose, Bld: 160 mg/dL — ABNORMAL HIGH (ref 70–99)
HCT: 30 % — ABNORMAL LOW (ref 39.0–52.0)

## 2011-11-20 LAB — SURGICAL PCR SCREEN: MRSA, PCR: NEGATIVE

## 2011-11-20 SURGERY — INSERTION OF ARTERIOVENOUS (AV) GORE-TEX GRAFT ARM
Anesthesia: Monitor Anesthesia Care | Site: Arm Upper | Laterality: Right | Wound class: Clean

## 2011-11-20 MED ORDER — SODIUM CHLORIDE 0.9 % IR SOLN
Status: DC | PRN
  Administered 2011-11-20: 11:00:00

## 2011-11-20 MED ORDER — HEPARIN SODIUM (PORCINE) 1000 UNIT/ML IJ SOLN
INTRAMUSCULAR | Status: DC | PRN
  Administered 2011-11-20: 6000 [IU] via INTRAVENOUS

## 2011-11-20 MED ORDER — SODIUM CHLORIDE 0.9 % IV SOLN
INTRAVENOUS | Status: DC | PRN
  Administered 2011-11-20: 10:00:00 via INTRAVENOUS

## 2011-11-20 MED ORDER — LIDOCAINE-EPINEPHRINE (PF) 1 %-1:200000 IJ SOLN
INTRAMUSCULAR | Status: DC | PRN
  Administered 2011-11-20: 28 mL

## 2011-11-20 MED ORDER — DEXTROSE 5 % IV SOLN
1.5000 g | INTRAVENOUS | Status: AC
  Administered 2011-11-20: 1.5 g via INTRAVENOUS

## 2011-11-20 MED ORDER — 0.9 % SODIUM CHLORIDE (POUR BTL) OPTIME
TOPICAL | Status: DC | PRN
  Administered 2011-11-20: 1000 mL

## 2011-11-20 MED ORDER — PROPOFOL 10 MG/ML IV EMUL
INTRAVENOUS | Status: DC | PRN
  Administered 2011-11-20: 75 ug/kg/min via INTRAVENOUS

## 2011-11-20 MED ORDER — PROTAMINE SULFATE 10 MG/ML IV SOLN
INTRAVENOUS | Status: DC | PRN
  Administered 2011-11-20: 10 mg via INTRAVENOUS

## 2011-11-20 MED ORDER — MUPIROCIN 2 % EX OINT: TOPICAL_OINTMENT | Freq: Two times a day (BID) | CUTANEOUS | Status: DC

## 2011-11-20 MED ORDER — LIDOCAINE HCL (CARDIAC) 20 MG/ML IV SOLN
INTRAVENOUS | Status: DC | PRN
  Administered 2011-11-20: 50 mg via INTRAVENOUS

## 2011-11-20 MED ORDER — SODIUM CHLORIDE 0.9 % IV SOLN
INTRAVENOUS | Status: DC
  Administered 2011-11-20: 09:00:00 via INTRAVENOUS

## 2011-11-20 MED ORDER — MUPIROCIN 2 % EX OINT
TOPICAL_OINTMENT | CUTANEOUS | Status: AC
  Filled 2011-11-20: qty 22

## 2011-11-20 MED ORDER — FENTANYL CITRATE 0.05 MG/ML IJ SOLN
INTRAMUSCULAR | Status: DC | PRN
  Administered 2011-11-20: 50 ug via INTRAVENOUS
  Administered 2011-11-20: 25 ug via INTRAVENOUS

## 2011-11-20 MED ORDER — OXYCODONE HCL 5 MG PO TABS: 5.0000 mg | ORAL_TABLET | Freq: Four times a day (QID) | ORAL | Status: AC | PRN

## 2011-11-20 SURGICAL SUPPLY — 37 items
ADH SKN CLS APL DERMABOND .7 (GAUZE/BANDAGES/DRESSINGS) ×2
CANISTER SUCTION 2500CC (MISCELLANEOUS) ×2 IMPLANT
CLIP TI MEDIUM 6 (CLIP) ×2 IMPLANT
CLIP TI WIDE RED SMALL 6 (CLIP) ×2 IMPLANT
CLOTH BEACON ORANGE TIMEOUT ST (SAFETY) ×2 IMPLANT
COVER SURGICAL LIGHT HANDLE (MISCELLANEOUS) ×4 IMPLANT
DECANTER SPIKE VIAL GLASS SM (MISCELLANEOUS) ×1 IMPLANT
DERMABOND ADVANCED (GAUZE/BANDAGES/DRESSINGS) ×2
DERMABOND ADVANCED .7 DNX12 (GAUZE/BANDAGES/DRESSINGS) ×1 IMPLANT
ELECT REM PT RETURN 9FT ADLT (ELECTROSURGICAL) ×2
ELECTRODE REM PT RTRN 9FT ADLT (ELECTROSURGICAL) ×1 IMPLANT
GEL ULTRASOUND 20GR AQUASONIC (MISCELLANEOUS) ×1 IMPLANT
GLOVE BIO SURGEON STRL SZ 6.5 (GLOVE) ×2 IMPLANT
GLOVE BIO SURGEON STRL SZ7.5 (GLOVE) ×2 IMPLANT
GLOVE BIOGEL PI IND STRL 6.5 (GLOVE) IMPLANT
GLOVE BIOGEL PI IND STRL 7.5 (GLOVE) ×1 IMPLANT
GLOVE BIOGEL PI INDICATOR 6.5 (GLOVE) ×3
GLOVE BIOGEL PI INDICATOR 7.5 (GLOVE) ×1
GLOVE ORTHOPEDIC STR SZ6.5 (GLOVE) ×1 IMPLANT
GLOVE SURG SS PI 6.0 STRL IVOR (GLOVE) ×1 IMPLANT
GOWN STRL NON-REIN LRG LVL3 (GOWN DISPOSABLE) ×6 IMPLANT
GRAFT GORETEX STRT 4-7X45 (Vascular Products) ×1 IMPLANT
KIT BASIN OR (CUSTOM PROCEDURE TRAY) ×2 IMPLANT
KIT ROOM TURNOVER OR (KITS) ×2 IMPLANT
NS IRRIG 1000ML POUR BTL (IV SOLUTION) ×2 IMPLANT
PACK CV ACCESS (CUSTOM PROCEDURE TRAY) ×2 IMPLANT
PAD ARMBOARD 7.5X6 YLW CONV (MISCELLANEOUS) ×4 IMPLANT
SPONGE SURGIFOAM ABS GEL 100 (HEMOSTASIS) IMPLANT
SUT PROLENE 6 0 BV (SUTURE) ×7 IMPLANT
SUT VIC AB 3-0 SH 27 (SUTURE) ×4
SUT VIC AB 3-0 SH 27X BRD (SUTURE) ×2 IMPLANT
SUT VICRYL 4-0 PS2 18IN ABS (SUTURE) ×4 IMPLANT
TOWEL OR 17X24 6PK STRL BLUE (TOWEL DISPOSABLE) ×2 IMPLANT
TOWEL OR 17X26 10 PK STRL BLUE (TOWEL DISPOSABLE) ×2 IMPLANT
UNDERPAD 30X30 INCONTINENT (UNDERPADS AND DIAPERS) ×2 IMPLANT
VT06010L IMPLANT
WATER STERILE IRR 1000ML POUR (IV SOLUTION) ×2 IMPLANT

## 2011-11-20 NOTE — Anesthesia Postprocedure Evaluation (Signed)
Anesthesia Post Note  Patient: Gerald Davenport  Procedure(s) Performed: Procedure(s) (LRB): INSERTION OF ARTERIOVENOUS (AV) GORE-TEX GRAFT ARM (Right)  Anesthesia type: general  Patient location: PACU  Post pain: Pain level controlled  Post assessment: Patient's Cardiovascular Status Stable  Last Vitals:  Filed Vitals:   11/20/11 1215  BP: 185/72  Pulse: 84  Temp:   Resp: 15    Post vital signs: Reviewed and stable  Level of consciousness: sedated  Complications: No apparent anesthesia complications

## 2011-11-20 NOTE — Discharge Instructions (Signed)
    11/20/2011 Gerald Davenport [id number] Feb 11, 1941  Surgeon(s): Chuck Hint, MD  Procedure(s): INSERTION OF ARTERIOVENOUS (AV) GORE-TEX GRAFT ARM Left upper arm        x Do not stick graft for 4 weeks

## 2011-11-20 NOTE — Preoperative (Signed)
Beta Blockers   Reason not to administer Beta Blockers:Not Applicable 

## 2011-11-20 NOTE — H&P (Signed)
Chuck Hint, MD    [facility] Specialist of [facility]   Patient name: Gerald Davenport MRN: [id number] DOB: [birth year] Sex: male   CC: For Right AVG   HPI:  Gerald Davenport is a 71 y.o. male who underwent a right radiocephalic AV fistula on 07/30/2010. This failed and he had a right brachiocephalic AV fistula on 09/02/2010. This too failed. He then had a left brachiocephalic AV fistula in 11/04/2010. This also failed. I then attempted a basilic vein transposition in the left arm on 06/24/2011. He comes in today to have this checked. He has had no pain in the arm and no recent uremic symptoms.   Past Medical History   Diagnosis  Date   .  Hypertension    .  Hyperlipidemia    .  Chronic kidney disease    .  Diabetes mellitus    .  Cancer      Prostate cancer treated with radiation therapy   .  Thyroid disease      hyperparathyroidism    Family History   Problem  Relation  Age of Onset   .  Heart disease  Mother    .  Diabetes  Mother    .  Hypertension  Mother    .  Stroke  Mother    .  Heart disease  Father    .  Diabetes  Father    .  Heart disease  Sister    .  Diabetes  Brother     SOCIAL HISTORY:  History   Substance Use Topics   .  Smoking status:  Current Everyday Smoker -- 1.0 packs/day for 50 years     Types:  Cigarettes   .  Smokeless tobacco:  Not on file   .  Alcohol Use:  No    No Known Allergies  Current Outpatient Prescriptions   Medication  Sig  Dispense  Refill   .  aspirin 81 MG tablet  Take 81 mg by mouth daily.     Marland Kitchen  buPROPion (ZYBAN) 150 MG 12 hr tablet  Take 150 mg by mouth 2 (two) times daily.     .  Carboxymethylcellul-Glycerin (OPTIVE) 0.5-0.9 % SOLN  Apply to eye as needed.     .  cloNIDine (CATAPRES) 0.2 MG tablet  Take 0.2 mg by mouth 2 (two) times daily.     Marland Kitchen  doxazosin (CARDURA) 8 MG tablet  Take 8 mg by mouth at bedtime.     .  furosemide (LASIX) 40 MG tablet  Take 40 mg by mouth 2 (two) times daily.     Marland Kitchen  glyBURIDE (DIABETA) 5  MG tablet  Take 5 mg by mouth 2 (two) times daily with a meal.     .  METFORMIN HCL PO  Take 500 mg by mouth 2 (two) times daily.     Marland Kitchen  oxycodone (OXY-IR) 5 MG capsule  Take 5 mg by mouth every 4 (four) hours as needed.     Marland Kitchen  QUEtiapine (SEROQUEL) 100 MG tablet  Take 100 mg by mouth at bedtime.     Marland Kitchen  RAMIPRIL PO  Take 20 mg by mouth daily.     .  simvastatin (ZOCOR) 20 MG tablet  Take 20 mg by mouth at bedtime.     .  Tamsulosin HCl (FLOMAX) 0.4 MG CAPS  Take 0.4 mg by mouth.     .  telmisartan-hydrochlorothiazide (MICARDIS HCT) 80-12.5 MG per tablet  Take 1 tablet by mouth daily.     .  calcitRIOL (ROCALTROL) 0.25 MCG capsule  Take 0.25 mcg by mouth every Monday, Wednesday, and Friday.      REVIEW OF SYSTEMS: Arly.Keller ] denotes positive finding; [ ]  denotes negative finding  CARDIOVASCULAR: [ ]  chest pain [ ]  chest pressure [ ]  palpitations [ ]  orthopnea  [ ]  dyspnea on exertion [ ]  claudication [ ]  rest pain [ ]  DVT [ ]  phlebitis  PULMONARY: [ ]  productive cough [ ]  asthma [ ]  wheezing  CONSTITUTIONAL: [ ]  fever [ ]  chills  PHYSICAL EXAM:  Filed Vitals:    08/06/11 1345   BP:  195/82   Pulse:  92   Resp:  20   Height:  5\' 9"  (1.753 m)   Weight:  174 lb (78.926 kg)   SpO2:  98%    Body mass index is 25.70 kg/(m^2).  GENERAL: The patient is a well-nourished male, in no acute distress. The vital signs are documented above.  CARDIOVASCULAR: There is a regular rate and rhythm without significant murmur appreciated.  PULMONARY: There is good air exchange bilaterally without wheezing or rales.  His left basilic vein transposition is occluded. Her is no bruit or thrill.  DATA:  I did interrogate the right basilic vein myself in the office and this vein is very small I did not think it is usable for a basilic vein transposition on the right.   MEDICAL ISSUES:  Unfortunately I think this patient will require an AV graft. As he is not yet on dialysis. I have explained the indications for  placement of an AV fistula or AV graft. I've explained that if at all possible we will place an AV fistula.  I have reviewed the risks of placement of an AV graft. These risks include, but are not limited to, graft thrombosis, graft infection, wound healing problems, bleeding, arm swelling, and steal syndrome. All the patient's questions were answered and they are agreeable to proceed with surgery.    Natally Ribera S  [facility] [place]  Office: [phone]

## 2011-11-20 NOTE — Op Note (Signed)
NAME: Gerald Davenport    MRN: [id number] DOB: Jan 05, 1941    DATE OF OPERATION: 11/20/2011  PREOP DIAGNOSIS: Chronic kidney disease   POSTOP DIAGNOSIS: Same  PROCEDURE: new right upper arm AV graft  SURGEON: Di Kindle. Edilia Bo, MD, FACS  ASSIST: Newton Pigg PA  ANESTHESIA: local with sedation   EBL: minimal  INDICATIONS: Gerald Davenport is a 71 y.o. male who is not yet on dialysis. He is failed 3 attempts at a fistula. He comes in for placement of an elective right arm AV graft.  FINDINGS: he had a large axillary vein. Artery was good size.  TECHNIQUE: The patient was brought to the operating room and sedated by anesthesia. The right upper extremity was prepped and draped in the usual sterile fashion. A longitudinal incision was made just above the antecubital level where the brachial artery was dissected free proximal and distal to where the previous brachiocephalic AV fistula was created. A separate longitudinal incision was made beneath the axilla after the skin was anesthetized. The brachial veins in the antecubital level were too small for a forearm AV graft. A high brachial vein was good size. A 4-7 mm graft was then tunneled between the 2 incisions and the patient was heparinized. The brachial artery was clamped proximally and distally. The vein was removed from the brachial artery from the previous anastomosis. There was intimal hyperplasia in the proximal vein. The 4 mm the graft was spatulated after a short segment was excised and then sewn end-to-side to the brachial artery using continuous 6-0 Prolene suture. The graft was then pulled to the appropriate length for anastomosis to the high brachial vein. The graft was cut to the appropriate length spatulated and sewn end to end to the vein using continuous 6-0 Prolene suture. The fascia was an excellent thrill in the graft. There was a palpable radial pulse. Hemostasis was obtained the wounds. The heparin was partially reversed with  protamine. The wounds were closed with deep layer 3-0 Vicryl and the skin closed with 4-0 Vicryl. Dermabond was applied. The patient tolerated the procedure well and was transferred to the recovery room in stable condition. All needle and sponge counts were correct.   Waverly Ferrari, MD, FACS [facility] of [facility]  DATE OF DICTATION:   11/20/2011

## 2011-11-20 NOTE — Anesthesia Preprocedure Evaluation (Signed)
Anesthesia Evaluation  Patient identified by MRN, date of birth, ID band Patient awake    Airway Mallampati: I      Dental  (+) Edentulous Upper, Edentulous Lower and Dental Advidsory Given   Pulmonary          Cardiovascular hypertension, On Medications     Neuro/Psych    GI/Hepatic GERD-  Controlled,  Endo/Other  Diabetes mellitus-, Type 1, Insulin Dependent  Renal/GU ESRF and DialysisRenal disease     Musculoskeletal   Abdominal   Peds  Hematology  (+) anemia ,   Anesthesia Other Findings   Reproductive/Obstetrics                           Anesthesia Physical Anesthesia Plan  ASA: III  Anesthesia Plan:    Post-op Pain Management:    Induction:   Airway Management Planned:   Additional Equipment:   Intra-op Plan:   Post-operative Plan:   Informed Consent:   Dental Advisory Given  Plan Discussed with: Anesthesiologist, CRNA and Surgeon  Anesthesia Plan Comments:         Anesthesia Quick Evaluation

## 2011-11-20 NOTE — Transfer of Care (Signed)
Immediate Anesthesia Transfer of Care Note  Patient: Gerald Davenport  Procedure(s) Performed: Procedure(s) (LRB): INSERTION OF ARTERIOVENOUS (AV) GORE-TEX GRAFT ARM (Right)  Patient Location: PACU  Anesthesia Type: MAC  Level of Consciousness: awake, alert  and oriented  Airway & Oxygen Therapy: Patient Spontanous Breathing  Post-op Assessment: Report given to PACU RN, Post -op Vital signs reviewed and stable and Patient moving all extremities X 4  Post vital signs: Reviewed and stable  Complications: No apparent anesthesia complications

## 2011-11-21 MED FILL — Mupirocin Oint 2%: CUTANEOUS | Qty: 22 | Status: AC

## 2011-11-24 ENCOUNTER — Encounter (HOSPITAL_COMMUNITY): Payer: Self-pay | Admitting: Vascular Surgery

## 2011-12-02 ENCOUNTER — Encounter: Payer: Self-pay | Admitting: Vascular Surgery

## 2011-12-03 ENCOUNTER — Ambulatory Visit (INDEPENDENT_AMBULATORY_CARE_PROVIDER_SITE_OTHER): Payer: Medicare Other | Admitting: Physician Assistant

## 2011-12-03 VITALS — BP 149/62 | HR 85

## 2011-12-03 DIAGNOSIS — N186 End stage renal disease: Secondary | ICD-10-CM

## 2011-12-03 NOTE — Progress Notes (Signed)
[facility] [place] Postoperative Visit hemodialysis access   Date of Surgery:  11/20/11 Surgeon: Edilia Bo HD:  no HD:  Days:    CC: f/u for RUA AVG placement on 11/20/11 by Dr. Edilia Bo.    HPI:  This is a 71 y.o. male who returns today s/p  RUA AVG placement on 11/20/11 by Dr. Edilia Bo.  His only complaint today is that he is having a little pain around the incision.    PHYSICAL EXAMINATION:  Filed Vitals:   12/03/11 1321  BP: 149/62  Pulse: 85  98% RA   Both  Incision is healing nicely Hand grip is equal bilaterally and sensation in digits is intact; There is  Thrill; there is bruit. The graft/fistula is easily palpable  Pulse:    ASSESSMENT/PLAN:  Gerald Davenport is a 71 y.o. year old male who presents s/p  RUA AVG placement on 11/20/11 by Dr. Edilia Bo.  He is doing well from his RUA AVG placement.  It has a good bruit and thrill and his wounds are healing nicely.  His graft will be ready for use 12/18/11.  He will f/u with [place] PRN.  Gerald Pigg, PA-C [facility] [phone]  Clinic MD:   Edilia Bo

## 2011-12-08 ENCOUNTER — Other Ambulatory Visit (HOSPITAL_COMMUNITY): Payer: Self-pay | Admitting: *Deleted

## 2011-12-10 ENCOUNTER — Encounter (HOSPITAL_COMMUNITY)
Admission: RE | Admit: 2011-12-10 | Discharge: 2011-12-10 | Disposition: A | Discharge status: Home / Self Care | Payer: Medicare Other | Source: Ambulatory Visit | Attending: Nephrology | Admitting: Nephrology

## 2011-12-10 DIAGNOSIS — D509 Iron deficiency anemia, unspecified: Secondary | ICD-10-CM | POA: Insufficient documentation

## 2011-12-10 MED ORDER — FERUMOXYTOL INJECTION 510 MG/17 ML
510.0000 mg | Freq: Once | INTRAVENOUS | Status: AC
  Administered 2011-12-10: 510 mg via INTRAVENOUS

## 2011-12-10 MED ORDER — SODIUM CHLORIDE 0.9 % IV SOLN
Freq: Once | INTRAVENOUS | Status: AC
  Administered 2011-12-10: 11:00:00 via INTRAVENOUS

## 2011-12-10 MED ORDER — FERUMOXYTOL INJECTION 510 MG/17 ML
INTRAVENOUS | Status: AC
  Filled 2011-12-10: qty 17

## 2012-02-02 ENCOUNTER — Other Ambulatory Visit (HOSPITAL_COMMUNITY): Payer: Self-pay | Admitting: *Deleted

## 2012-02-04 ENCOUNTER — Encounter (HOSPITAL_COMMUNITY): Payer: Medicare Other

## 2012-02-18 ENCOUNTER — Encounter (HOSPITAL_COMMUNITY)
Admission: RE | Admit: 2012-02-18 | Discharge: 2012-02-18 | Disposition: A | Discharge status: Home / Self Care | Payer: Medicare Other | Source: Ambulatory Visit | Attending: Nephrology | Admitting: Nephrology

## 2012-02-18 DIAGNOSIS — D509 Iron deficiency anemia, unspecified: Secondary | ICD-10-CM | POA: Insufficient documentation

## 2012-02-18 LAB — POCT HEMOGLOBIN-HEMACUE: Hemoglobin: 9 g/dL — ABNORMAL LOW (ref 13.0–17.0)

## 2012-02-18 MED ORDER — EPOETIN ALFA 10000 UNIT/ML IJ SOLN
INTRAMUSCULAR | Status: AC
  Filled 2012-02-18: qty 1

## 2012-02-18 MED ORDER — FERUMOXYTOL INJECTION 510 MG/17 ML
510.0000 mg | Freq: Once | INTRAVENOUS | Status: AC
  Administered 2012-02-18: 510 mg via INTRAVENOUS
  Filled 2012-02-18: qty 17

## 2012-02-18 MED ORDER — SODIUM CHLORIDE 0.9 % IV SOLN
Freq: Once | INTRAVENOUS | Status: AC
  Administered 2012-02-18: 15:00:00 via INTRAVENOUS

## 2012-02-18 MED ORDER — EPOETIN ALFA 20000 UNIT/ML IJ SOLN
INTRAMUSCULAR | Status: AC
  Administered 2012-02-18: 30000 [IU]
  Filled 2012-02-18: qty 1

## 2012-02-18 MED ORDER — EPOETIN ALFA 40000 UNIT/ML IJ SOLN: 30000.0000 [IU] | INTRAMUSCULAR | Status: DC

## 2012-02-19 MED FILL — Epoetin Alfa Inj 20000 Unit/ML: INTRAMUSCULAR | Qty: 1 | Status: AC

## 2012-02-19 MED FILL — Epoetin Alfa Inj 10000 Unit/ML: INTRAMUSCULAR | Qty: 1 | Status: AC

## 2012-02-28 DIAGNOSIS — Z992 Dependence on renal dialysis: Secondary | ICD-10-CM

## 2012-02-28 HISTORY — DX: Dependence on renal dialysis: Z99.2

## 2012-03-02 ENCOUNTER — Other Ambulatory Visit (HOSPITAL_COMMUNITY): Payer: Self-pay | Admitting: *Deleted

## 2012-03-03 ENCOUNTER — Encounter (HOSPITAL_COMMUNITY): Payer: Medicare Other

## 2012-03-09 ENCOUNTER — Ambulatory Visit (HOSPITAL_COMMUNITY)
Admission: RE | Admit: 2012-03-09 | Discharge: 2012-03-09 | Disposition: A | Discharge status: Home / Self Care | Payer: Medicare Other | Source: Ambulatory Visit | Attending: Nephrology | Admitting: Nephrology

## 2012-03-09 ENCOUNTER — Other Ambulatory Visit (HOSPITAL_COMMUNITY): Payer: Self-pay | Admitting: Nephrology

## 2012-03-09 DIAGNOSIS — F17200 Nicotine dependence, unspecified, uncomplicated: Secondary | ICD-10-CM | POA: Insufficient documentation

## 2012-03-09 DIAGNOSIS — R7611 Nonspecific reaction to tuberculin skin test without active tuberculosis: Secondary | ICD-10-CM

## 2012-04-10 ENCOUNTER — Other Ambulatory Visit (HOSPITAL_COMMUNITY): Payer: Self-pay | Admitting: Medical

## 2012-04-10 DIAGNOSIS — N186 End stage renal disease: Secondary | ICD-10-CM

## 2012-04-11 ENCOUNTER — Other Ambulatory Visit (HOSPITAL_COMMUNITY): Payer: Self-pay | Admitting: Medical

## 2012-04-11 ENCOUNTER — Ambulatory Visit (HOSPITAL_COMMUNITY)
Admission: RE | Admit: 2012-04-11 | Discharge: 2012-04-11 | Disposition: A | Discharge status: Home / Self Care | Payer: Medicare Other | Source: Ambulatory Visit | Attending: Medical | Admitting: Medical

## 2012-04-11 DIAGNOSIS — I871 Compression of vein: Secondary | ICD-10-CM | POA: Insufficient documentation

## 2012-04-11 DIAGNOSIS — N186 End stage renal disease: Secondary | ICD-10-CM

## 2012-04-11 DIAGNOSIS — E785 Hyperlipidemia, unspecified: Secondary | ICD-10-CM | POA: Insufficient documentation

## 2012-04-11 DIAGNOSIS — Y832 Surgical operation with anastomosis, bypass or graft as the cause of abnormal reaction of the patient, or of later complication, without mention of misadventure at the time of the procedure: Secondary | ICD-10-CM | POA: Insufficient documentation

## 2012-04-11 DIAGNOSIS — I120 Hypertensive chronic kidney disease with stage 5 chronic kidney disease or end stage renal disease: Secondary | ICD-10-CM | POA: Insufficient documentation

## 2012-04-11 DIAGNOSIS — I771 Stricture of artery: Secondary | ICD-10-CM | POA: Insufficient documentation

## 2012-04-11 DIAGNOSIS — E119 Type 2 diabetes mellitus without complications: Secondary | ICD-10-CM | POA: Insufficient documentation

## 2012-04-11 DIAGNOSIS — Z8546 Personal history of malignant neoplasm of prostate: Secondary | ICD-10-CM | POA: Insufficient documentation

## 2012-04-11 DIAGNOSIS — T82898A Other specified complication of vascular prosthetic devices, implants and grafts, initial encounter: Secondary | ICD-10-CM | POA: Insufficient documentation

## 2012-04-11 LAB — GLUCOSE, CAPILLARY: Glucose-Capillary: 122 mg/dL — ABNORMAL HIGH (ref 70–99)

## 2012-04-11 MED ORDER — IOHEXOL 300 MG/ML  SOLN
100.0000 mL | Freq: Once | INTRAMUSCULAR | Status: AC | PRN
  Administered 2012-04-11: 50 mL via INTRAVENOUS

## 2012-04-11 MED ORDER — MIDAZOLAM HCL 2 MG/2ML IJ SOLN
INTRAMUSCULAR | Status: AC
  Filled 2012-04-11: qty 4

## 2012-04-11 MED ORDER — FENTANYL CITRATE 0.05 MG/ML IJ SOLN
INTRAMUSCULAR | Status: AC
  Filled 2012-04-11: qty 4

## 2012-04-11 MED ORDER — HEPARIN SODIUM (PORCINE) 1000 UNIT/ML IJ SOLN
INTRAMUSCULAR | Status: AC
  Administered 2012-04-11: 3000 [IU]
  Filled 2012-04-11: qty 1

## 2012-04-11 MED ORDER — ALTEPLASE 100 MG IV SOLR
2.0000 mg | Freq: Once | INTRAVENOUS | Status: AC
  Administered 2012-04-11: 2 mg
  Filled 2012-04-11: qty 2

## 2012-04-11 NOTE — H&P (Signed)
Agree 

## 2012-04-11 NOTE — H&P (Signed)
Gerald Davenport is an 71 y.o. male.   Chief Complaint: clotted right arm dialysis graft HPI: Patient with history of ESRD and clotted right arm AVGG presents today for thrombolysis with possible angioplasty/stenting of graft or placement of new dialysis catheter if needed.  Past Medical History  Diagnosis Date  . Hypertension   . Hyperlipidemia   . Chronic kidney disease   . Diabetes mellitus   . Cancer     Prostate cancer treated with radiation therapy  . Thyroid disease     hyperparathyroidism  . Anemia     Past Surgical History  Procedure Date  . 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 Av fistula placement 11/04/2010    left and left  . Av fistula placement 11/20/2011    Procedure: INSERTION OF ARTERIOVENOUS (AV) GORE-TEX GRAFT ARM;  Surgeon: Chuck Hint, MD;  Location: [facility] OR;  Service: Vascular;  Laterality: Right;    Family History  Problem Relation Age of Onset  . Heart disease Mother   . Diabetes Mother   . Hypertension Mother   . Stroke Mother   . Heart disease Father   . Diabetes Father   . Heart disease Sister   . Diabetes Brother    Social History:  reports that he has been smoking Cigarettes.  He has a 50 pack-year smoking history. He does not have any smokeless tobacco history on file. He reports that he does not drink alcohol or use illicit drugs.  Allergies: No Known Allergies  Current outpatient prescriptions:aspirin 81 MG tablet, Take 81 mg by mouth daily.  , Disp: , Rfl: ;  buPROPion (ZYBAN) 150 MG 12 hr tablet, Take 150 mg by mouth 2 (two) times daily. , Disp: , Rfl: ;  calcitRIOL (ROCALTROL) 0.25 MCG capsule, Take 0.25 mcg by mouth daily. , Disp: , Rfl: ;  Calcium Acetate 667 MG TABS, Take 667 mg by mouth 3 (three) times daily with meals., Disp: , Rfl:  cloNIDine (CATAPRES) 0.2 MG tablet, Take 0.2 mg by mouth 2 (two) times daily. , Disp: , Rfl: ;  doxazosin (CARDURA) 8 MG tablet, Take 8 mg by mouth at bedtime. , Disp: , Rfl: ;  glyBURIDE (DIABETA)  5 MG tablet, Take 5 mg by mouth 2 (two) times daily with a meal.  , Disp: , Rfl: ;  insulin detemir (LEVEMIR) 100 UNIT/ML injection, Inject 12-16 Units into the skin daily. 12-16 units depending on blood sugar, Disp: , Rfl:  ramipril (ALTACE) 10 MG capsule, Take 20 mg by mouth daily., Disp: , Rfl: ;  simvastatin (ZOCOR) 20 MG tablet, Take 20 mg by mouth at bedtime.  , Disp: , Rfl: ;  Tamsulosin HCl (FLOMAX) 0.4 MG CAPS, Take 0.4 mg by mouth.  , Disp: , Rfl: ;  telmisartan-hydrochlorothiazide (MICARDIS HCT) 80-12.5 MG per tablet, Take 1 tablet by mouth daily.  , Disp: , Rfl: ;  Carboxymethylcellul-Glycerin (OPTIVE) 0.5-0.9 % SOLN, Apply to eye as needed.  , Disp: , Rfl:  furosemide (LASIX) 40 MG tablet, Take 40 mg by mouth 2 (two) times daily.  , Disp: , Rfl: ;  QUEtiapine (SEROQUEL) 100 MG tablet, Take 100 mg by mouth at bedtime. , Disp: , Rfl:  Current facility-administered medications:alteplase (ACTIVASE) injection 2 mg, 2 mg, Intracatheter, Once, Reola Calkins, MD 04/10/12   K+  3.5  Review of Systems  Constitutional: Negative for fever and chills.  Respiratory: Negative for cough and shortness of breath.   Cardiovascular: Negative  for chest pain.  Gastrointestinal: Negative for nausea, vomiting and abdominal pain.  Musculoskeletal: Negative for back pain.  Neurological: Negative for headaches.  Endo/Heme/Allergies: Does not bruise/bleed easily.   BP 161/65  HR  63  TEMP 97.7  O2 SATS  99%RA   CBG 129 Physical Exam  Constitutional: He is oriented to person, place, and time. He appears well-developed and well-nourished.  Cardiovascular: Normal rate and regular rhythm.         Rt arm AVGG with no thrill/bruit  Respiratory: Effort normal.       Few rt basilar crackles, left clear  GI: Soft. Bowel sounds are normal. There is no tenderness.  Musculoskeletal: Normal range of motion.       Rt BKA  Neurological: He is alert and oriented to person, place, and time.      Assessment/Plan Patient with ESRD and clotted right arm AVGG ; plan is for thrombolysis with possible angioplasty/stenting of graft or placement of new dialysis catheter if needed. Details of procedure d/w pt with his understanding and consent.  ALLRED,D KEVIN 04/11/2012, 8:31 AM

## 2012-04-11 NOTE — Procedures (Signed)
Procedure:  Declot with angioplasty of right upper arm HD graft Venous anastamotic stenosis:  Dilated with 7 mm balloon Arterial anastamotic stenosis:  Dilated with 5 mm balloon Angiojet thrombectomy. Good flow after procedure.

## 2012-04-12 ENCOUNTER — Telehealth (HOSPITAL_COMMUNITY): Payer: Self-pay | Admitting: *Deleted

## 2012-04-12 NOTE — Telephone Encounter (Signed)
No answer.  VM left for pt to return call with any questions or concerns

## 2012-07-10 ENCOUNTER — Other Ambulatory Visit (HOSPITAL_COMMUNITY): Payer: Self-pay | Admitting: Nephrology

## 2012-07-10 DIAGNOSIS — N186 End stage renal disease: Secondary | ICD-10-CM

## 2012-07-12 ENCOUNTER — Ambulatory Visit (HOSPITAL_COMMUNITY)
Admission: RE | Admit: 2012-07-12 | Discharge: 2012-07-12 | Disposition: A | Discharge status: Home / Self Care | Payer: Medicare Other | Source: Ambulatory Visit | Attending: Nephrology | Admitting: Nephrology

## 2012-07-12 ENCOUNTER — Other Ambulatory Visit (HOSPITAL_COMMUNITY): Payer: Self-pay | Admitting: Nephrology

## 2012-07-12 DIAGNOSIS — E119 Type 2 diabetes mellitus without complications: Secondary | ICD-10-CM | POA: Insufficient documentation

## 2012-07-12 DIAGNOSIS — E785 Hyperlipidemia, unspecified: Secondary | ICD-10-CM | POA: Insufficient documentation

## 2012-07-12 DIAGNOSIS — E213 Hyperparathyroidism, unspecified: Secondary | ICD-10-CM | POA: Insufficient documentation

## 2012-07-12 DIAGNOSIS — N186 End stage renal disease: Secondary | ICD-10-CM | POA: Insufficient documentation

## 2012-07-12 DIAGNOSIS — C61 Malignant neoplasm of prostate: Secondary | ICD-10-CM | POA: Insufficient documentation

## 2012-07-12 DIAGNOSIS — T82898A Other specified complication of vascular prosthetic devices, implants and grafts, initial encounter: Secondary | ICD-10-CM | POA: Insufficient documentation

## 2012-07-12 DIAGNOSIS — Z923 Personal history of irradiation: Secondary | ICD-10-CM | POA: Insufficient documentation

## 2012-07-12 DIAGNOSIS — I120 Hypertensive chronic kidney disease with stage 5 chronic kidney disease or end stage renal disease: Secondary | ICD-10-CM | POA: Insufficient documentation

## 2012-07-12 DIAGNOSIS — Y849 Medical procedure, unspecified as the cause of abnormal reaction of the patient, or of later complication, without mention of misadventure at the time of the procedure: Secondary | ICD-10-CM | POA: Insufficient documentation

## 2012-07-12 LAB — GLUCOSE, CAPILLARY: Glucose-Capillary: 298 mg/dL — ABNORMAL HIGH (ref 70–99)

## 2012-07-12 LAB — POTASSIUM: Potassium: 4.8 mEq/L (ref 3.5–5.1)

## 2012-07-12 LAB — POCT I-STAT 4, (NA,K, GLUC, HGB,HCT)
Glucose, Bld: 309 mg/dL — ABNORMAL HIGH (ref 70–99)
Sodium: 135 mEq/L (ref 135–145)

## 2012-07-12 MED ORDER — FENTANYL CITRATE 0.05 MG/ML IJ SOLN
INTRAMUSCULAR | Status: AC | PRN
  Administered 2012-07-12 (×2): 25 ug via INTRAVENOUS

## 2012-07-12 MED ORDER — IOHEXOL 300 MG/ML  SOLN
100.0000 mL | Freq: Once | INTRAMUSCULAR | Status: AC | PRN
  Administered 2012-07-12: 50 mL via INTRAVENOUS

## 2012-07-12 MED ORDER — HEPARIN SODIUM (PORCINE) 1000 UNIT/ML IJ SOLN
INTRAMUSCULAR | Status: AC | PRN
  Administered 2012-07-12: 3000 [IU] via INTRAVENOUS

## 2012-07-12 MED ORDER — MIDAZOLAM HCL 2 MG/2ML IJ SOLN
INTRAMUSCULAR | Status: AC | PRN
  Administered 2012-07-12 (×2): 1 mg via INTRAVENOUS

## 2012-07-12 MED ORDER — HEPARIN SODIUM (PORCINE) 1000 UNIT/ML IJ SOLN
INTRAMUSCULAR | Status: AC
  Filled 2012-07-12: qty 1

## 2012-07-12 MED ORDER — MIDAZOLAM HCL 2 MG/2ML IJ SOLN
INTRAMUSCULAR | Status: AC
  Filled 2012-07-12: qty 4

## 2012-07-12 MED ORDER — ALTEPLASE 2 MG IJ SOLR
2.0000 mg | Freq: Once | INTRAMUSCULAR | Status: DC
  Filled 2012-07-12: qty 2

## 2012-07-12 MED ORDER — FENTANYL CITRATE 0.05 MG/ML IJ SOLN
INTRAMUSCULAR | Status: AC
  Filled 2012-07-12: qty 4

## 2012-07-12 MED ORDER — ALTEPLASE 30 MG/30 ML FOR INTERV. RAD
INTRA_ARTERIAL | Status: AC | PRN
  Administered 2012-07-12: 30 mg via INTRA_ARTERIAL

## 2012-07-12 NOTE — H&P (Signed)
Gerald Davenport is an 71 y.o. male.   Chief Complaint: clotted right arm dialysis graft HPI: Pt with ESRD and clotted right arm AVGG presents today for thrombolysis, possible angioplasty/stenting of graft or placement of new catheter if needed.   Past Medical History  Diagnosis Date  . Hypertension   . Hyperlipidemia   . Chronic kidney disease   . Diabetes mellitus   . Cancer     Prostate cancer treated with radiation therapy  . Thyroid disease     hyperparathyroidism  . Anemia     Past Surgical History  Procedure Date  . 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 Av fistula placement 11/04/2010    left and left  . Av fistula placement 11/20/2011    Procedure: INSERTION OF ARTERIOVENOUS (AV) GORE-TEX GRAFT ARM;  Surgeon: Gerald Hint, MD;  Location: [facility] OR;  Service: Vascular;  Laterality: Right;    Family History  Problem Relation Age of Onset  . Heart disease Mother   . Diabetes Mother   . Hypertension Mother   . Stroke Mother   . Heart disease Father   . Diabetes Father   . Heart disease Sister   . Diabetes Brother    Social History:  reports that he has been smoking Cigarettes.  He has a 50 pack-year smoking history. He does not have any smokeless tobacco history on file. He reports that he does not drink alcohol or use illicit drugs.  Allergies: No Known Allergies  Current outpatient prescriptions:aspirin 81 MG tablet, Take 81 mg by mouth daily.  , Disp: , Rfl: ;  buPROPion (ZYBAN) 150 MG 12 hr tablet, Take 150 mg by mouth 2 (two) times daily. , Disp: , Rfl: ;  calcitRIOL (ROCALTROL) 0.25 MCG capsule, Take 0.25 mcg by mouth daily. , Disp: , Rfl: ;  Calcium Acetate 667 MG TABS, Take 667 mg by mouth 3 (three) times daily with meals., Disp: , Rfl:  cloNIDine (CATAPRES) 0.2 MG tablet, Take 0.2 mg by mouth 2 (two) times daily. , Disp: , Rfl: ;  doxazosin (CARDURA) 8 MG tablet, Take 8 mg by mouth at bedtime. , Disp: , Rfl: ;  furosemide (LASIX) 40 MG tablet, Take 40 mg by  mouth 2 (two) times daily.  , Disp: , Rfl: ;  glyBURIDE (DIABETA) 5 MG tablet, Take 5 mg by mouth 2 (two) times daily with a meal.  , Disp: , Rfl:  insulin detemir (LEVEMIR) 100 UNIT/ML injection, Inject 12-16 Units into the skin daily. 12-16 units depending on blood sugar, Disp: , Rfl: ;  QUEtiapine (SEROQUEL) 100 MG tablet, Take 100 mg by mouth at bedtime. , Disp: , Rfl: ;  ramipril (ALTACE) 10 MG capsule, Take 20 mg by mouth daily., Disp: , Rfl: ;  simvastatin (ZOCOR) 20 MG tablet, Take 20 mg by mouth at bedtime.  , Disp: , Rfl:  Tamsulosin HCl (FLOMAX) 0.4 MG CAPS, Take 0.4 mg by mouth.  , Disp: , Rfl: ;  telmisartan-hydrochlorothiazide (MICARDIS HCT) 80-12.5 MG per tablet, Take 1 tablet by mouth daily.  , Disp: , Rfl: ;  Carboxymethylcellul-Glycerin (OPTIVE) 0.5-0.9 % SOLN, Apply to eye as needed.  , Disp: , Rfl:  Current facility-administered medications:alteplase (CATHFLO ACTIVASE) injection 2 mg, 2 mg, Intracatheter, Once, Gerald Miu, MD   Results for orders placed during the hospital encounter of 07/12/12 (from the past 48 hour(s))  POCT I-STAT 4, (NA,K, GLUC, HGB,HCT)     Status: Abnormal  Collection Time   07/12/12 11:10 AM      Component Value Range Comment   Sodium 135  135 - 145 mEq/L    Potassium 5.3 (*) 3.5 - 5.1 mEq/L    Glucose, Bld 309 (*) 70 - 99 mg/dL    HCT 47.8 (*) 29.5 - 52.0 %    Hemoglobin 6.8 (*) 13.0 - 17.0 g/dL   GLUCOSE, CAPILLARY     Status: Abnormal   Collection Time   07/12/12 11:11 AM      Component Value Range Comment   Glucose-Capillary 298 (*) 70 - 99 mg/dL    No results found.  Review of Systems  Constitutional: Positive for malaise/fatigue. Negative for fever and chills.  Respiratory: Negative for cough and shortness of breath.   Cardiovascular: Negative for chest pain.  Gastrointestinal: Negative for nausea, vomiting and abdominal pain.  Musculoskeletal: Negative for back pain.  Neurological: Negative for headaches.  Endo/Heme/Allergies:  Does not bruise/bleed easily.    Blood pressure 155/60, pulse 63, temperature 98.3 F (36.8 C), temperature source Oral, SpO2 100.00%. Physical Exam  Constitutional: He is oriented to person, place, and time. He appears well-developed and well-nourished.  Cardiovascular: Normal rate and regular rhythm.        Rt arm AVGG with no thrill/bruit  Respiratory: Effort normal.       Sl dim BS rt base  GI: Soft. Bowel sounds are normal.  Musculoskeletal: Normal range of motion. He exhibits no edema.       Rt BKA  Neurological: He is alert and oriented to person, place, and time.     Assessment/Plan Pt with ESRD and clotted right arm AVGG. Plan is for thrombolysis, possible angioplasty/stenting of graft or placement of new catheter if needed. Details/risks of procedure Gerald/w pt with his understanding and consent. Renal service notified of pt's HGB and potassium levels.  Gerald Davenport,Gerald Davenport 07/12/2012, 11:36 AM

## 2012-07-12 NOTE — Procedures (Signed)
Successful declot of right upper arm graft.  No immediate complication.  See Radiology report.

## 2012-07-13 ENCOUNTER — Telehealth (HOSPITAL_COMMUNITY): Payer: Self-pay | Admitting: *Deleted

## 2012-08-07 ENCOUNTER — Other Ambulatory Visit (HOSPITAL_COMMUNITY): Payer: Self-pay | Admitting: Nephrology

## 2012-08-07 DIAGNOSIS — N186 End stage renal disease: Secondary | ICD-10-CM

## 2012-08-08 ENCOUNTER — Encounter (HOSPITAL_COMMUNITY): Payer: Self-pay

## 2012-08-08 ENCOUNTER — Other Ambulatory Visit (HOSPITAL_COMMUNITY): Payer: Self-pay | Admitting: Nephrology

## 2012-08-08 ENCOUNTER — Ambulatory Visit (HOSPITAL_COMMUNITY)
Admission: RE | Admit: 2012-08-08 | Discharge: 2012-08-08 | Disposition: A | Discharge status: Home / Self Care | Payer: Medicare Other | Source: Ambulatory Visit | Attending: Nephrology | Admitting: Nephrology

## 2012-08-08 DIAGNOSIS — N186 End stage renal disease: Secondary | ICD-10-CM

## 2012-08-08 DIAGNOSIS — T82898A Other specified complication of vascular prosthetic devices, implants and grafts, initial encounter: Secondary | ICD-10-CM | POA: Insufficient documentation

## 2012-08-08 DIAGNOSIS — Y832 Surgical operation with anastomosis, bypass or graft as the cause of abnormal reaction of the patient, or of later complication, without mention of misadventure at the time of the procedure: Secondary | ICD-10-CM | POA: Insufficient documentation

## 2012-08-08 DIAGNOSIS — N189 Chronic kidney disease, unspecified: Secondary | ICD-10-CM | POA: Insufficient documentation

## 2012-08-08 DIAGNOSIS — E785 Hyperlipidemia, unspecified: Secondary | ICD-10-CM | POA: Insufficient documentation

## 2012-08-08 DIAGNOSIS — I871 Compression of vein: Secondary | ICD-10-CM | POA: Insufficient documentation

## 2012-08-08 DIAGNOSIS — I129 Hypertensive chronic kidney disease with stage 1 through stage 4 chronic kidney disease, or unspecified chronic kidney disease: Secondary | ICD-10-CM | POA: Insufficient documentation

## 2012-08-08 DIAGNOSIS — E119 Type 2 diabetes mellitus without complications: Secondary | ICD-10-CM | POA: Insufficient documentation

## 2012-08-08 LAB — GLUCOSE, CAPILLARY: Glucose-Capillary: 251 mg/dL — ABNORMAL HIGH (ref 70–99)

## 2012-08-08 MED ORDER — IOHEXOL 300 MG/ML  SOLN
100.0000 mL | Freq: Once | INTRAMUSCULAR | Status: AC | PRN
  Administered 2012-08-08: 50 mL via INTRAVENOUS

## 2012-08-08 MED ORDER — FENTANYL CITRATE 0.05 MG/ML IJ SOLN
INTRAMUSCULAR | Status: AC
  Filled 2012-08-08: qty 2

## 2012-08-08 MED ORDER — MIDAZOLAM HCL 2 MG/2ML IJ SOLN
INTRAMUSCULAR | Status: AC
  Filled 2012-08-08: qty 2

## 2012-08-08 MED ORDER — MIDAZOLAM HCL 2 MG/2ML IJ SOLN
INTRAMUSCULAR | Status: AC | PRN
  Administered 2012-08-08: 1 mg via INTRAVENOUS

## 2012-08-08 MED ORDER — ALTEPLASE 100 MG IV SOLR
2.0000 mg | Freq: Once | INTRAVENOUS | Status: AC
  Administered 2012-08-08: 1 mg
  Filled 2012-08-08: qty 2

## 2012-08-08 MED ORDER — HEPARIN SODIUM (PORCINE) 1000 UNIT/ML IJ SOLN
INTRAMUSCULAR | Status: AC
  Filled 2012-08-08: qty 1

## 2012-08-08 MED ORDER — HEPARIN SODIUM (PORCINE) 1000 UNIT/ML IJ SOLN
INTRAMUSCULAR | Status: AC | PRN
  Administered 2012-08-08: 3000 [IU] via INTRAVENOUS

## 2012-08-08 MED ORDER — FENTANYL CITRATE 0.05 MG/ML IJ SOLN
INTRAMUSCULAR | Status: AC | PRN
  Administered 2012-08-08: 50 ug via INTRAVENOUS

## 2012-08-08 NOTE — H&P (Signed)
Agree 

## 2012-08-08 NOTE — Procedures (Signed)
Procedure:  AVGG declot with angioplasty Right upper arm graft opened successfully.  Venous anastamotic stenosis treated with 7 mm angioplasty.  Central innominate stenosis treated with 12 mm angioplasty.  Good result.

## 2012-08-08 NOTE — H&P (Signed)
Chief Complaint: Clotted rt UE AVG Referring Physician:Powell HPI: Gerald Davenport is an 71 y.o. male with ESRD and Rt UE AVG that has been clotted since yesterday. Last had HD on Thurs. Feeling fine otherwise. Has had declot procedure before, tolerates it well. PMHx and meds reviewed.  Past Medical History:  Past Medical History  Diagnosis Date  . Hypertension   . Hyperlipidemia   . Chronic kidney disease   . Diabetes mellitus   . Cancer     Prostate cancer treated with radiation therapy  . Thyroid disease     hyperparathyroidism  . Anemia     Past Surgical History:  Past Surgical History  Procedure Date  . 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 Av fistula placement 11/04/2010    left and left  . Av fistula placement 11/20/2011    Procedure: INSERTION OF ARTERIOVENOUS (AV) GORE-TEX GRAFT ARM;  Surgeon: Chuck Hint, MD;  Location: [facility] OR;  Service: Vascular;  Laterality: Right;    Family History:  Family History  Problem Relation Age of Onset  . Heart disease Mother   . Diabetes Mother   . Hypertension Mother   . Stroke Mother   . Heart disease Father   . Diabetes Father   . Heart disease Sister   . Diabetes Brother     Social History:  reports that he has been smoking Cigarettes.  He has a 50 pack-year smoking history. He does not have any smokeless tobacco history on file. He reports that he does not drink alcohol or use illicit drugs.  Allergies: No Known Allergies  Medications: Current outpatient prescriptions:aspirin 81 MG tablet, Take 81 mg by mouth daily. , Disp: , Rfl: ; buPROPion (ZYBAN) 150 MG 12 hr tablet, Take 150 mg by mouth 2 (two) times daily. , Disp: , Rfl: ; calcitRIOL (ROCALTROL) 0.25 MCG capsule, Take 0.25 mcg by mouth daily. , Disp: , Rfl: ; Calcium Acetate 667 MG TABS, Take 667 mg by mouth 3 (three) times daily with meals., Disp: , Rfl:  cloNIDine (CATAPRES) 0.2 MG tablet, Take 0.2 mg by mouth 2 (two) times daily. , Disp: , Rfl: ; doxazosin  (CARDURA) 8 MG tablet, Take 8 mg by mouth at bedtime. , Disp: , Rfl: ; furosemide (LASIX) 40 MG tablet, Take 40 mg by mouth 2 (two) times daily. , Disp: , Rfl: ; glyBURIDE (DIABETA) 5 MG tablet, Take 5 mg by mouth 2 (two) times daily with a meal. , Disp: , Rfl:  insulin detemir (LEVEMIR) 100 UNIT/ML injection, Inject 12-16 Units into the skin daily. 12-16 units depending on blood sugar, Disp: , Rfl: ; QUEtiapine (SEROQUEL) 100 MG tablet, Take 100 mg by mouth at bedtime. , Disp: , Rfl: ; ramipril (ALTACE) 10 MG capsule, Take 20 mg by mouth daily., Disp: , Rfl: ; simvastatin (ZOCOR) 20 MG tablet, Take 20 mg by mouth at bedtime. , Disp: , Rfl:  Tamsulosin HCl (FLOMAX) 0.4 MG CAPS, Take 0.4 mg by mouth. , Disp: , Rfl: ; telmisartan-hydrochlorothiazide (MICARDIS HCT) 80-12.5 MG per tablet, Take 1 tablet by mouth daily. , Disp: , Rfl: ; Carboxymethylcellul-Glycerin (OPTIVE) 0.5-0.9 % SOLN, Apply to eye as needed.   Please HPI for pertinent positives, otherwise complete 10 system ROS negative.  Physical Exam: Blood pressure 147/85, pulse 75, temperature 98 F (36.7 C), SpO2 100.00%. There is no height or weight on file to calculate BMI.   General Appearance:  Alert, cooperative, no distress, appears stated age  Head:  Normocephalic, without obvious abnormality, atraumatic  ENT: Unremarkable  Neck: Supple, symmetrical, trachea midline, no adenopathy, thyroid: not enlarged, symmetric, no tenderness/mass/nodules  Lungs:   Clear to auscultation bilaterally, no w/r/r, respirations unlabored without use of accessory muscles.  Heart:  Regular rate and rhythm, S1, S2 normal, no murmur, rub or gallop. Carotids 2+ without bruit.  Extremities: Rt UE AVG without palp pulse, good brachial and radial pulses, hand warm.  Pulses: 2+ and symmetric  Neurologic: Normal affect, no gross deficits.   Results for orders placed during the hospital encounter of 08/08/12 (from the past 48 hour(s))  POTASSIUM     Status:  Normal   Collection Time   08/08/12  8:30 AM      Component Value Range Comment   Potassium 4.7  3.5 - 5.1 mEq/L   GLUCOSE, CAPILLARY     Status: Abnormal   Collection Time   08/08/12  8:44 AM      Component Value Range Comment   Glucose-Capillary 251 (*) 70 - 99 mg/dL    Comment 1 Notify RN      Comment 2 Documented in Chart      No results found.  Assessment/Plan ESRD Clotted RT UE AVG For thrombolysis, thrombectomy, possible angioplasty, poss temp cath Discussed procedure and risks, including sedation Consent signed in chart  Brayton El PA-C 08/08/2012, 9:29 AM

## 2012-08-26 ENCOUNTER — Other Ambulatory Visit (HOSPITAL_COMMUNITY): Payer: Self-pay | Admitting: Nephrology

## 2012-08-26 ENCOUNTER — Ambulatory Visit (HOSPITAL_COMMUNITY)
Admission: RE | Admit: 2012-08-26 | Discharge: 2012-08-26 | Disposition: A | Discharge status: Home / Self Care | Payer: Medicare Other | Source: Ambulatory Visit | Attending: Nephrology | Admitting: Nephrology

## 2012-08-26 DIAGNOSIS — R059 Cough, unspecified: Secondary | ICD-10-CM | POA: Insufficient documentation

## 2012-08-26 DIAGNOSIS — R05 Cough: Secondary | ICD-10-CM | POA: Insufficient documentation

## 2012-09-08 ENCOUNTER — Encounter: Payer: Self-pay | Admitting: Gastroenterology

## 2012-09-08 ENCOUNTER — Ambulatory Visit (INDEPENDENT_AMBULATORY_CARE_PROVIDER_SITE_OTHER): Payer: Medicare Other | Admitting: Gastroenterology

## 2012-09-08 VITALS — BP 182/72 | HR 83 | Temp 98.3°F | Ht 68.0 in | Wt 171.6 lb

## 2012-09-08 DIAGNOSIS — R159 Full incontinence of feces: Secondary | ICD-10-CM

## 2012-09-08 DIAGNOSIS — K219 Gastro-esophageal reflux disease without esophagitis: Secondary | ICD-10-CM

## 2012-09-08 MED ORDER — PEG 3350-KCL-NA BICARB-NACL 420 G PO SOLR: 4000.0000 mL | ORAL | Status: DC

## 2012-09-08 NOTE — Progress Notes (Signed)
Primary Care Physician:  Benita Stabile, MD Primary Gastroenterologist:  Dr. Jena Gauss   Chief Complaint  Patient presents with  . Constipation    HPI:   71 year old male who presents today due to fecal incontinence. States symptoms started about 2 months ago, intermittent in nature. No diarrhea. States prior to this would go 2-3 days without a BM, hx of constipation. No abdominal pain. No wt loss or lack of appetite. No nausea or vomiting. Eats all the time, can't get enough. Eating a lot of ice. +indigestion, told to take a "white pill" from the drugstore. States he believes he had a colonoscopy in remote past, . Unsure who performed or where. No rectal bleeding.   No changes to medications, no sick contacts, no recent abx.   Past Medical History  Diagnosis Date  . Hypertension   . Hyperlipidemia   . Chronic kidney disease     dialysis Tues, Thur, Sat  . Diabetes mellitus   . Cancer     Prostate cancer treated with radiation therapy  . Thyroid disease     hyperparathyroidism  . Anemia     Past Surgical History  Procedure Date  . 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 Av fistula placement 11/04/2010    left and left  . Av fistula placement 11/20/2011    Procedure: INSERTION OF ARTERIOVENOUS (AV) GORE-TEX GRAFT ARM;  Surgeon: Chuck Hint, MD;  Location: [facility] OR;  Service: Vascular;  Laterality: Right;    Current Outpatient Prescriptions  Medication Sig Dispense Refill  . aspirin 81 MG tablet Take 81 mg by mouth daily.        Marland Kitchen buPROPion (ZYBAN) 150 MG 12 hr tablet Take 150 mg by mouth 2 (two) times daily.       . calcitRIOL (ROCALTROL) 0.25 MCG capsule Take 0.25 mcg by mouth daily.       . Calcium Acetate 667 MG TABS Take 667 mg by mouth 3 (three) times daily with meals.      . cloNIDine (CATAPRES) 0.2 MG tablet Take 0.2 mg by mouth 2 (two) times daily.       Marland Kitchen doxazosin (CARDURA) 8 MG tablet Take 8 mg by mouth at bedtime.       . furosemide  (LASIX) 40 MG tablet Take 40 mg by mouth 2 (two) times daily.        Marland Kitchen glyBURIDE (DIABETA) 5 MG tablet Take 5 mg by mouth 2 (two) times daily with a meal.        . insulin detemir (LEVEMIR) 100 UNIT/ML injection Inject 12-16 Units into the skin daily. 12-16 units depending on blood sugar      . QUEtiapine (SEROQUEL) 100 MG tablet Take 100 mg by mouth at bedtime.       . ramipril (ALTACE) 10 MG capsule Take 20 mg by mouth daily.      . simvastatin (ZOCOR) 20 MG tablet Take 20 mg by mouth at bedtime.        . Tamsulosin HCl (FLOMAX) 0.4 MG CAPS Take 0.4 mg by mouth.        . telmisartan-hydrochlorothiazide (MICARDIS HCT) 80-12.5 MG per tablet Take 1 tablet by mouth daily.          Allergies as of 09/08/2012  . (No Known Allergies)    Family History  Problem Relation Age of Onset  . Heart disease Mother   . Diabetes Mother   . Hypertension Mother   . Stroke  Mother   . Heart disease Father   . Diabetes Father   . Heart disease Sister   . Diabetes Brother   . Colon cancer Neg Hx     History   Social History  . Marital Status: Married    Spouse Name: N/A    Number of Children: N/A  . Years of Education: N/A   Occupational History  . Not on file.   Social History Main Topics  . Smoking status: Current Every Day Smoker -- 1.0 packs/day for 50 years    Types: Cigarettes  . Smokeless tobacco: Not on file  . Alcohol Use: No  . Drug Use: No  . Sexually Active: Not on file   Other Topics Concern  . Not on file   Social History Narrative  . No narrative on file    Review of Systems: Gen: Denies any fever, chills, fatigue, weight loss, lack of appetite.  CV: Denies chest pain, heart palpitations, peripheral edema, syncope.  Resp: Denies shortness of breath at rest or with exertion. Denies wheezing or cough.  GI: SEE HPI GU : Denies urinary burning, urinary frequency, urinary hesitancy MS: Denies joint pain, muscle weakness, cramps, or limitation of movement.  Derm: Denies  rash, itching, dry skin Psych: Denies depression, anxiety, memory loss, and confusion Heme: Denies bruising, bleeding, and enlarged lymph nodes.  Physical Exam: BP 182/72  Pulse 83  Temp 98.3 F (36.8 C) (Temporal)  Ht 5\' 8"  (1.727 m)  Wt 171 lb 9.6 oz (77.837 kg)  BMI 26.09 kg/m2 General:   Alert and oriented. Pleasant and cooperative. Well-nourished and well-developed.  Head:  Normocephalic and atraumatic. Eyes:  Without icterus, sclera clear and conjunctiva pink.  Ears:  Normal auditory acuity. Nose:  No deformity, discharge,  or lesions. Mouth:  No deformity or lesions, oral mucosa pink.  Neck:  Supple, without mass or thyromegaly. Lungs:  Clear to auscultation bilaterally. No wheezes, rales, or rhonchi. No distress.  Heart:  S1, S2 present without murmurs appreciated.  Abdomen:  +BS, soft, non-tender and non-distended. No HSM noted. No guarding or rebound. No masses appreciated.  Rectal:  Deferred  Extremities:  Right BKA with prosthesis Neurologic:  Alert and  oriented x4;  grossly normal neurologically. Skin:  Intact without significant lesions or rashes. Cervical Nodes:  No significant cervical adenopathy. Psych:  Alert and cooperative. Normal mood and affect.

## 2012-09-08 NOTE — Patient Instructions (Addendum)
We have scheduled you for a colonoscopy with Dr. Jena Gauss in the near future.  Do not take your diabetes medication the day of the procedure.  Start taking supplemental fiber, (metamucil) daily. Avoid fatty, greasy foods. Further recommendations once the procedure is completed.  Merry Christmas!

## 2012-09-10 ENCOUNTER — Telehealth: Payer: Self-pay | Admitting: Gastroenterology

## 2012-09-10 DIAGNOSIS — R159 Full incontinence of feces: Secondary | ICD-10-CM | POA: Insufficient documentation

## 2012-09-10 DIAGNOSIS — K219 Gastro-esophageal reflux disease without esophagitis: Secondary | ICD-10-CM | POA: Insufficient documentation

## 2012-09-10 MED ORDER — OMEPRAZOLE 20 MG PO CPDR: 20.0000 mg | DELAYED_RELEASE_CAPSULE | Freq: Every day | ORAL | Status: DC

## 2012-09-10 NOTE — Assessment & Plan Note (Signed)
Notes intermittent indigestion; states he has taken a "white pill" from the drug store. No dysphagia, N/V. No warning signs currently. Institute daily Prilosec.

## 2012-09-10 NOTE — Telephone Encounter (Signed)
I forgot to address his reflux symptoms at office visit recently. He notes some type of OTC agent he had been using.  If he is still noting reflux symptoms as at his office visit, let's start Prilosec 20 mg po daily. I have sent a prescription to the pharmacy.  Let's also make sure he has an appt to f/u after his colonoscopy is completed to reassess GERD and fecal incontinence.

## 2012-09-10 NOTE — Assessment & Plan Note (Signed)
71 year old male with new onset fecal seepage, denies diarrhea. No change in medications, recent abx. Last TCS in remote past in [place]; pt is unsure what year or who performed. Regardless, enough time has passed that lower GI evaluation would be appropriate due to new onset of symptoms.   Institute fiber supplement daily Proceed with TCS with Dr. Jena Gauss in near future: the risks, benefits, and alternatives have been discussed with the patient in detail. The patient states understanding and desires to proceed.

## 2012-09-13 ENCOUNTER — Encounter (HOSPITAL_COMMUNITY): Payer: Self-pay | Admitting: Pharmacy Technician

## 2012-09-13 NOTE — Progress Notes (Signed)
Faxed to PCP

## 2012-10-04 ENCOUNTER — Ambulatory Visit (HOSPITAL_COMMUNITY): Admission: RE | Admit: 2012-10-04 | Payer: Medicare Other | Source: Ambulatory Visit | Admitting: Internal Medicine

## 2012-10-04 ENCOUNTER — Telehealth: Payer: Self-pay | Admitting: Internal Medicine

## 2012-10-04 ENCOUNTER — Encounter (HOSPITAL_COMMUNITY): Admission: RE | Payer: Self-pay | Source: Ambulatory Visit

## 2012-10-04 SURGERY — COLONOSCOPY
Anesthesia: Moderate Sedation

## 2012-10-04 NOTE — Telephone Encounter (Signed)
Caregiver called over the weekend and left message on machine that patient wouldn't make it for his procedure because he was feeling bad with a cold will need to R/S when hes better

## 2012-10-05 ENCOUNTER — Other Ambulatory Visit: Payer: Self-pay | Admitting: *Deleted

## 2012-10-08 ENCOUNTER — Ambulatory Visit (HOSPITAL_COMMUNITY)
Admission: RE | Admit: 2012-10-08 | Discharge: 2012-10-08 | Disposition: A | Discharge status: Home / Self Care | Payer: Medicare PPO | Source: Ambulatory Visit | Attending: Vascular Surgery | Admitting: Vascular Surgery

## 2012-10-08 ENCOUNTER — Encounter (HOSPITAL_COMMUNITY)
Admission: RE | Disposition: A | Discharge status: Home / Self Care | Payer: Self-pay | Source: Ambulatory Visit | Attending: Vascular Surgery

## 2012-10-08 DIAGNOSIS — T82898A Other specified complication of vascular prosthetic devices, implants and grafts, initial encounter: Secondary | ICD-10-CM

## 2012-10-08 DIAGNOSIS — Z992 Dependence on renal dialysis: Secondary | ICD-10-CM | POA: Insufficient documentation

## 2012-10-08 DIAGNOSIS — I871 Compression of vein: Secondary | ICD-10-CM | POA: Insufficient documentation

## 2012-10-08 DIAGNOSIS — Y832 Surgical operation with anastomosis, bypass or graft as the cause of abnormal reaction of the patient, or of later complication, without mention of misadventure at the time of the procedure: Secondary | ICD-10-CM | POA: Insufficient documentation

## 2012-10-08 DIAGNOSIS — E119 Type 2 diabetes mellitus without complications: Secondary | ICD-10-CM | POA: Insufficient documentation

## 2012-10-08 DIAGNOSIS — N186 End stage renal disease: Secondary | ICD-10-CM | POA: Insufficient documentation

## 2012-10-08 DIAGNOSIS — I120 Hypertensive chronic kidney disease with stage 5 chronic kidney disease or end stage renal disease: Secondary | ICD-10-CM | POA: Insufficient documentation

## 2012-10-08 HISTORY — PX: SHUNTOGRAM: SHX5491

## 2012-10-08 LAB — POCT I-STAT, CHEM 8
Calcium, Ion: 1.08 mmol/L — ABNORMAL LOW (ref 1.13–1.30)
Chloride: 96 mEq/L (ref 96–112)
Glucose, Bld: 274 mg/dL — ABNORMAL HIGH (ref 70–99)
HCT: 33 % — ABNORMAL LOW (ref 39.0–52.0)
TCO2: 32 mmol/L (ref 0–100)

## 2012-10-08 LAB — GLUCOSE, CAPILLARY
Glucose-Capillary: 238 mg/dL — ABNORMAL HIGH (ref 70–99)
Glucose-Capillary: 235 mg/dL — ABNORMAL HIGH (ref 70–99)

## 2012-10-08 SURGERY — ASSESSMENT, SHUNT FUNCTION, WITH CONTRAST RADIOGRAPHIC STUDY
Anesthesia: LOCAL | Laterality: Right

## 2012-10-08 MED ORDER — ACETAMINOPHEN 325 MG RE SUPP: 325.0000 mg | RECTAL | Status: DC | PRN

## 2012-10-08 MED ORDER — HEPARIN SODIUM (PORCINE) 1000 UNIT/ML IJ SOLN
INTRAMUSCULAR | Status: AC
  Filled 2012-10-08: qty 1

## 2012-10-08 MED ORDER — SODIUM CHLORIDE 0.9 % IJ SOLN: 3.0000 mL | INTRAMUSCULAR | Status: DC | PRN

## 2012-10-08 MED ORDER — ACETAMINOPHEN 325 MG PO TABS: 325.0000 mg | ORAL_TABLET | ORAL | Status: DC | PRN

## 2012-10-08 MED ORDER — HYDRALAZINE HCL 20 MG/ML IJ SOLN: 10.0000 mg | INTRAMUSCULAR | Status: DC | PRN

## 2012-10-08 MED ORDER — DOCUSATE SODIUM 100 MG PO CAPS: 100.0000 mg | ORAL_CAPSULE | Freq: Every day | ORAL | Status: DC

## 2012-10-08 MED ORDER — METOPROLOL TARTRATE 1 MG/ML IV SOLN: 2.0000 mg | INTRAVENOUS | Status: DC | PRN

## 2012-10-08 MED ORDER — ONDANSETRON HCL 4 MG/2ML IJ SOLN: 4.0000 mg | Freq: Four times a day (QID) | INTRAMUSCULAR | Status: DC | PRN

## 2012-10-08 MED ORDER — LABETALOL HCL 5 MG/ML IV SOLN: 10.0000 mg | INTRAVENOUS | Status: DC | PRN

## 2012-10-08 MED ORDER — LIDOCAINE HCL (PF) 1 % IJ SOLN
INTRAMUSCULAR | Status: AC
  Filled 2012-10-08: qty 30

## 2012-10-08 NOTE — Op Note (Signed)
Procedure: Right arm shuntogram with PTA outflow vein  Preoperative diagnosis: Poor flow AV graft  Postoperative diagnosis: Same  Anesthesia: Local  Operative details: After obtaining informed consent, the patient was taken to the PV lab. The patient was placed in supine position on the Angio table. Entire right upper extremity was prepped and draped in usual sterile fashion. Local anesthesia was infiltrated over the midportion of the upper arm and the location of the graft. A micropuncture needle was brought up on the operative field and this was used to directly cannulate the fistula using ultrasound guidance. A micropuncture wire was then threaded into the graft and the micropuncture sheath threaded over this. Sheath was thoroughly flushed with heparinized saline and the dilator was removed. Contrast angiogram was then performed of the graft. The central venous structures are patent. There is a tandem high-grade stenosis both 70% at the level of the axilla. Pressure was held on the upper arm in order to reflux contrast across the arterial anastomosis. The arterial anastomosis is patent without narrowing.  At this point, it was decided to intervene on the distal stenoses. The micropuncture sheath was exchanged over an 035 versacore wire for a 6 French short sheath. This was thoroughly flushed with heparinized saline. Using anatomical landmarks an 035 Bentsen wire was brought on the operative field and advanced into the central venous system. A 5 x 4 mm angioplasty balloon was brought in operative field and the 2 distal narrowings angioplastied to 8 atmospheres for 30 seconds. There is minimal waisting on the balloon at this point. The 5 x 4 balloon was removed and exchanged for a 7 x 4 angioplasty balloon.  This was advanced to the 2 most distal stenoses and inflated to 12 atmospheres for 60 seconds at the distal lesion. Contrast angiogram was again repeated which showed the stenosis was less than 10%  with no evidence of dissection. I felt that angioplasty with a larger balloon would have put the fistula at risk of rupture.  The guidewire was removed as well as the balloon. A 3-0 Monocryl pursestring stitch was placed around the sheath and the sheath removed. However, there still some bleeding from the insertion site and hemostasis was obtained with direct pressure.  The patient tolerated the procedure well and there were no complications. The patient was taken to the holding area in stable condition.  Operative findings:  There was a 70% stenosis of the venous outflow treated to no residual stenosis.  This would be amenable to percutaneous treatment again if recurrent.     Fabienne Bruns, MD [facility] Office: [phone] Pager: [phone]

## 2012-10-08 NOTE — H&P (Signed)
[facility] H&P  CC:  Graft not working well  HPI: Right upper arm graft with multiple prior PTA.  Now with pulsatile graft suggesting recurrent venous stenosis.  No prior problems with contrast.  Past Medical History  Diagnosis Date  . Hypertension   . Hyperlipidemia   . Chronic kidney disease     dialysis Tues, Thur, Sat  . Diabetes mellitus   . Cancer     Prostate cancer treated with radiation therapy  . Thyroid disease     hyperparathyroidism  . Anemia     FH:  Non-Contributory  Social HX History  Substance Use Topics  . Smoking status: Current Every Day Smoker -- 1.0 packs/day for 50 years    Types: Cigarettes  . Smokeless tobacco: Not on file  . Alcohol Use: No    Allergies No Known Allergies  Medications Current Facility-Administered Medications  Medication Dose Route Frequency Provider Last Rate Last Dose  . 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

## 2012-10-14 NOTE — Telephone Encounter (Signed)
We need to offer pt a f/u visit. Looks like he had to cancel colonoscopy.  Non-urgent.  Thanks!

## 2012-10-15 ENCOUNTER — Encounter: Payer: Self-pay | Admitting: Gastroenterology

## 2012-10-15 NOTE — Telephone Encounter (Signed)
Pt is aware of OV on 2/3 at 2 with AS and appt card was mailed

## 2012-11-01 ENCOUNTER — Encounter: Payer: Self-pay | Admitting: Gastroenterology

## 2012-11-01 ENCOUNTER — Ambulatory Visit (INDEPENDENT_AMBULATORY_CARE_PROVIDER_SITE_OTHER): Payer: Medicare PPO | Admitting: Gastroenterology

## 2012-11-01 VITALS — BP 167/73 | HR 73 | Temp 97.6°F | Ht 68.0 in | Wt 174.2 lb

## 2012-11-01 DIAGNOSIS — Z1211 Encounter for screening for malignant neoplasm of colon: Secondary | ICD-10-CM

## 2012-11-01 DIAGNOSIS — K219 Gastro-esophageal reflux disease without esophagitis: Secondary | ICD-10-CM

## 2012-11-01 DIAGNOSIS — R159 Full incontinence of feces: Secondary | ICD-10-CM

## 2012-11-01 NOTE — Progress Notes (Signed)
 Referring Provider: Mitchell, Lewis Dean, MD Primary Care Physician:  MITCHELL,LEWIS DEAN, MD Primary Gastroenterologist: Dr. Rourk   Chief Complaint  Patient presents with  . Follow-up    HPI:   72-year-old male who presents today secondary to fecal incontinence. I saw him in Dec 2013, whereby he reported symptoms for several months. Prior hx of constipation. Last TCS in remote past, but he is unsure who performed this. We have attempted to retrieve records but to no avail.  I started him on fiber supplementation at the last visit and scheduled a colonoscopy. However, he had to cancel this due to a cold. He also reported intermittent reflux, no dysphagia. Noticed some improvement with Metamucil; he is not still taking this. Ran out of samples. He notes improvement of intermittent reflux with Prilosec daily.  No abdominal pain, no rectal bleeding. Denies GERD. No dysphagia. Dialysis Tues, Thurs, Sat. He would like to have the procedure on a Wednesday.   Past Medical History  Diagnosis Date  . Hypertension   . Hyperlipidemia   . Chronic kidney disease     dialysis Tues, Thur, Sat  . Diabetes mellitus   . Cancer     Prostate cancer treated with radiation therapy  . Thyroid disease     hyperparathyroidism  . Anemia     Past Surgical History  Procedure Date  . 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 Av fistula placement 11/04/2010    left and left  . Av fistula placement 11/20/2011    Procedure: INSERTION OF ARTERIOVENOUS (AV) GORE-TEX GRAFT ARM;  Surgeon: Christopher S Dickson, MD;  Location: [facility] OR;  Service: Vascular;  Laterality: Right;    Current Outpatient Prescriptions  Medication Sig Dispense Refill  . amLODipine (NORVASC) 10 MG tablet Take 10 mg by mouth daily.       . aspirin 81 MG tablet Take 81 mg by mouth daily.        . buPROPion (WELLBUTRIN SR) 150 MG 12 hr tablet Take 150 mg by mouth 2 (two) times daily.       . calcitRIOL (ROCALTROL) 0.25 MCG capsule Take  0.25 mcg by mouth daily.       . calcium acetate (PHOSLO) 667 MG capsule Take 667 mg by mouth 3 (three) times daily.       . Calcium Acetate 667 MG TABS Take 667 mg by mouth 3 (three) times daily with meals.      . cloNIDine (CATAPRES) 0.2 MG tablet Take 0.2 mg by mouth 2 (two) times daily.       . doxazosin (CARDURA) 8 MG tablet Take 8 mg by mouth at bedtime.       . furosemide (LASIX) 40 MG tablet Take 40 mg by mouth 2 (two) times daily.        . glyBURIDE (DIABETA) 5 MG tablet Take 5 mg by mouth 2 (two) times daily with a meal.        . insulin detemir (LEVEMIR) 100 UNIT/ML injection Inject 12-16 Units into the skin daily. 12-16 units depending on blood sugar      . QUEtiapine (SEROQUEL) 100 MG tablet Take 100 mg by mouth at bedtime.       . ramipril (ALTACE) 10 MG capsule Take 20 mg by mouth daily.      . simvastatin (ZOCOR) 20 MG tablet Take 20 mg by mouth at bedtime.        . Tamsulosin HCl (FLOMAX) 0.4 MG CAPS Take   0.4 mg by mouth.        . telmisartan-hydrochlorothiazide (MICARDIS HCT) 80-12.5 MG per tablet Take 1 tablet by mouth daily.          Allergies as of 11/01/2012  . (No Known Allergies)    Family History  Problem Relation Age of Onset  . Heart disease Mother   . Diabetes Mother   . Hypertension Mother   . Stroke Mother   . Heart disease Father   . Diabetes Father   . Heart disease Sister   . Diabetes Brother   . Colon cancer Neg Hx     History   Social History  . Marital Status: Married    Spouse Name: N/A    Number of Children: N/A  . Years of Education: N/A   Social History Main Topics  . Smoking status: Current Every Day Smoker -- 1.0 packs/day for 50 years    Types: Cigarettes  . Smokeless tobacco: None  . Alcohol Use: No  . Drug Use: No  . Sexually Active: None   Other Topics Concern  . None   Social History Narrative  . None    Review of Systems: Negative unless otherwise mentioned in HPI.  Physical Exam: BP 167/73  Pulse 73  Temp  97.6 F (36.4 C) (Oral)  Ht 5' 8" (1.727 m)  Wt 174 lb 3.2 oz (79.017 kg)  BMI 26.49 kg/m2 General:   Alert and oriented. No distress noted. Pleasant and cooperative.  Head:  Normocephalic and atraumatic. Eyes:  Conjuctiva clear without scleral icterus. Mouth:  Oral mucosa pink and moist. Good dentition. No lesions. Neck:  Supple, without mass or thyromegaly. Heart:  S1, S2 present without murmurs, rubs, or gallops. Regular rate and rhythm. Abdomen:  +BS, soft, non-tender and non-distended. No rebound or guarding. No HSM noted. Questionable ventral hernia  Msk:  Symmetrical without gross deformities. Right BKA with prosthesis.  Extremities:  Left lower extremity without edema, Right BKA prosthesis Neurologic:  Alert and  oriented x4;  grossly normal neurologically. Skin:  Intact without significant lesions or rashes. Cervical Nodes:  No significant cervical adenopathy. Psych:  Alert and cooperative. Normal mood and affect.    

## 2012-11-01 NOTE — Patient Instructions (Addendum)
We have set you up for a colonoscopy with Dr. Jena Gauss in the near future.  Please continue taking Metamucil daily.   Continue taking Prilosec daily.

## 2012-11-02 DIAGNOSIS — Z1211 Encounter for screening for malignant neoplasm of colon: Secondary | ICD-10-CM | POA: Insufficient documentation

## 2012-11-02 NOTE — Assessment & Plan Note (Signed)
72 year old male with last TCS in remote past, possibly in [place]. We have been unable to obtain records. Pt notes at least 10 years ago. New onset fecal incontinence several months ago, improved since addition of Metamucil. Needs repeat screening colonoscopy for further lower GI evaluation. No rectal bleeding noted. No other concerning symptoms such as weight loss, abdominal pain.   Proceed with TCS with Dr. Jena Gauss in near future: the risks, benefits, and alternatives have been discussed with the patient in detail. The patient states understanding and desires to proceed. No diabetes medications the day of the procedure Dialysis Tues, Thur, Sat. Pt would like on a Wednesday.

## 2012-11-02 NOTE — Assessment & Plan Note (Signed)
Improved with Prilosec. No dysphagia. Continue PPI daily.

## 2012-11-02 NOTE — Progress Notes (Signed)
Faxed to PCP

## 2012-11-02 NOTE — Assessment & Plan Note (Signed)
Improved with addition of Metamucil. Continue supplementation daily. Needs updated TCS, last in remote past in [place] per pt. Unable to retrieve op notes; likely >10 years ago.

## 2012-11-03 ENCOUNTER — Encounter (HOSPITAL_COMMUNITY): Payer: Self-pay | Admitting: Pharmacy Technician

## 2012-11-06 ENCOUNTER — Other Ambulatory Visit (HOSPITAL_COMMUNITY): Payer: Self-pay | Admitting: Nephrology

## 2012-11-06 DIAGNOSIS — N186 End stage renal disease: Secondary | ICD-10-CM

## 2012-11-07 ENCOUNTER — Ambulatory Visit (HOSPITAL_COMMUNITY)
Admission: RE | Admit: 2012-11-07 | Discharge: 2012-11-07 | Disposition: A | Discharge status: Home / Self Care | Payer: Medicare PPO | Source: Ambulatory Visit | Attending: Nephrology | Admitting: Nephrology

## 2012-11-07 ENCOUNTER — Other Ambulatory Visit (HOSPITAL_COMMUNITY): Payer: Self-pay | Admitting: Nephrology

## 2012-11-07 DIAGNOSIS — Z992 Dependence on renal dialysis: Secondary | ICD-10-CM | POA: Insufficient documentation

## 2012-11-07 DIAGNOSIS — T82898A Other specified complication of vascular prosthetic devices, implants and grafts, initial encounter: Secondary | ICD-10-CM | POA: Insufficient documentation

## 2012-11-07 DIAGNOSIS — N186 End stage renal disease: Secondary | ICD-10-CM

## 2012-11-07 DIAGNOSIS — I871 Compression of vein: Secondary | ICD-10-CM | POA: Insufficient documentation

## 2012-11-07 DIAGNOSIS — Y832 Surgical operation with anastomosis, bypass or graft as the cause of abnormal reaction of the patient, or of later complication, without mention of misadventure at the time of the procedure: Secondary | ICD-10-CM | POA: Insufficient documentation

## 2012-11-07 DIAGNOSIS — E119 Type 2 diabetes mellitus without complications: Secondary | ICD-10-CM | POA: Insufficient documentation

## 2012-11-07 DIAGNOSIS — I82609 Acute embolism and thrombosis of unspecified veins of unspecified upper extremity: Secondary | ICD-10-CM | POA: Insufficient documentation

## 2012-11-07 DIAGNOSIS — I120 Hypertensive chronic kidney disease with stage 5 chronic kidney disease or end stage renal disease: Secondary | ICD-10-CM | POA: Insufficient documentation

## 2012-11-07 LAB — POTASSIUM: Potassium: 4.8 mEq/L (ref 3.5–5.1)

## 2012-11-07 MED ORDER — IOHEXOL 300 MG/ML  SOLN
100.0000 mL | Freq: Once | INTRAMUSCULAR | Status: AC | PRN
  Administered 2012-11-07: 50 mL via INTRAVENOUS

## 2012-11-07 MED ORDER — ALTEPLASE 100 MG IV SOLR
4.0000 mg | Freq: Once | INTRAVENOUS | Status: AC
  Administered 2012-11-07: 4 mg
  Filled 2012-11-07: qty 4

## 2012-11-07 MED ORDER — MIDAZOLAM HCL 2 MG/2ML IJ SOLN
INTRAMUSCULAR | Status: AC
  Filled 2012-11-07: qty 4

## 2012-11-07 MED ORDER — FENTANYL CITRATE 0.05 MG/ML IJ SOLN
INTRAMUSCULAR | Status: AC
  Filled 2012-11-07: qty 4

## 2012-11-07 MED ORDER — MIDAZOLAM HCL 2 MG/2ML IJ SOLN
INTRAMUSCULAR | Status: AC | PRN
  Administered 2012-11-07: 1 mg via INTRAVENOUS

## 2012-11-07 MED ORDER — HEPARIN SODIUM (PORCINE) 1000 UNIT/ML IJ SOLN
INTRAMUSCULAR | Status: AC
  Filled 2012-11-07: qty 1

## 2012-11-07 MED ORDER — FENTANYL CITRATE 0.05 MG/ML IJ SOLN
INTRAMUSCULAR | Status: AC | PRN
  Administered 2012-11-07: 50 ug via INTRAVENOUS

## 2012-11-07 NOTE — Procedures (Signed)
Interventional Radiology Procedure Note  Procedure: Successful declot of right upper extremity straight AV graft with PTA of recurrent venous anastomotic stenosis to 7 mm. Complications: None Recommendations: - May resume dialysis  Signed,  Sterling Big, MD [facility] Interventional Radiologist [facility]

## 2012-11-07 NOTE — H&P (Signed)
Agree with PA note.    Signed,  Heath K. McCullough, MD Vascular & Interventional Radiologist [facility]  

## 2012-11-07 NOTE — H&P (Signed)
Gerald Davenport is an 72 y.o. male.   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  Past Medical History  Diagnosis Date  . Hypertension   . Hyperlipidemia   . Chronic kidney disease     dialysis Tues, Thur, Sat  . Diabetes mellitus   . Cancer     Prostate cancer treated with radiation therapy  . Thyroid disease     hyperparathyroidism  . Anemia     Past Surgical History  Procedure Laterality Date  . 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 Av fistula placement  11/04/2010    left and left  . Av fistula placement  11/20/2011    Procedure: INSERTION OF ARTERIOVENOUS (AV) GORE-TEX GRAFT ARM;  Surgeon: Chuck Hint, MD;  Location: [facility] OR;  Service: Vascular;  Laterality: Right;    Family History  Problem Relation Age of Onset  . Heart disease Mother   . Diabetes Mother   . Hypertension Mother   . Stroke Mother   . Heart disease Father   . Diabetes Father   . Heart disease Sister   . Diabetes Brother   . Colon cancer Neg Hx    Social History:  reports that he has been smoking Cigarettes.  He has a 50 pack-year smoking history. He does not have any smokeless tobacco history on file. He reports that he does not drink alcohol or use illicit drugs.  Allergies: No Known Allergies Current outpatient prescriptions:amLODipine (NORVASC) 10 MG tablet, Take 10 mg by mouth daily. , Disp: , Rfl: ;  aspirin 81 MG tablet, Take 81 mg by mouth daily.  , Disp: , Rfl: ;  buPROPion (WELLBUTRIN SR) 150 MG 12 hr tablet, Take 150 mg by mouth 2 (two) times daily. , Disp: , Rfl: ;  calcitRIOL (ROCALTROL) 0.25 MCG capsule, Take 0.25 mcg by mouth daily. , Disp: , Rfl:  calcium acetate (PHOSLO) 667 MG capsule, Take 667 mg by mouth 3 (three) times daily. , Disp: , Rfl: ;  Calcium Acetate 667 MG TABS, Take 667 mg by mouth 3  (three) times daily with meals., Disp: , Rfl: ;  cloNIDine (CATAPRES) 0.2 MG tablet, Take 0.2 mg by mouth 2 (two) times daily. , Disp: , Rfl: ;  doxazosin (CARDURA) 8 MG tablet, Take 8 mg by mouth at bedtime. , Disp: , Rfl:  furosemide (LASIX) 40 MG tablet, Take 40 mg by mouth 2 (two) times daily.  , Disp: , Rfl: ;  glyBURIDE (DIABETA) 5 MG tablet, Take 5 mg by mouth 2 (two) times daily with a meal.  , Disp: , Rfl: ;  insulin detemir (LEVEMIR) 100 UNIT/ML injection, Inject 12-16 Units into the skin daily. 12-16 units depending on blood sugar, Disp: , Rfl: ;  QUEtiapine (SEROQUEL) 100 MG tablet, Take 100 mg by mouth at bedtime. , Disp: , Rfl:  ramipril (ALTACE) 10 MG capsule, Take 20 mg by mouth daily., Disp: , Rfl: ;  simvastatin (ZOCOR) 20 MG tablet, Take 20 mg by mouth at bedtime.  , Disp: , Rfl: ;  Tamsulosin HCl (FLOMAX) 0.4 MG CAPS, Take 0.4 mg by mouth.  , Disp: , Rfl: ;  telmisartan-hydrochlorothiazide (MICARDIS HCT) 80-12.5 MG per tablet, Take 1 tablet by mouth daily.  , Disp: , Rfl:  Current facility-administered medications:alteplase (ACTIVASE) injection 4 mg, 4 mg,  Intracatheter, Once, Malachy Moan, MD    No results found for this or any previous visit (from the past 48 hour(s)). No results found.  Review of Systems  Constitutional: Negative for fever and chills.  Respiratory: Negative for cough and shortness of breath.   Cardiovascular: Negative for chest pain.  Gastrointestinal: Negative for nausea, vomiting and abdominal pain.  Musculoskeletal: Negative for back pain.  Neurological: Negative for headaches.  Endo/Heme/Allergies: Does not bruise/bleed easily.    There were no vitals taken for this visit. Physical Exam  Constitutional: He is oriented to person, place, and time. He appears well-developed and well-nourished.  Cardiovascular: Normal rate and regular rhythm.   Rt upper arm AVG with no thrill/bruit  Respiratory: Effort normal.  Sl dim BS bases bilat  GI: Soft.  Bowel sounds are normal. There is no tenderness.  Musculoskeletal: Normal range of motion.  Rt BKA with prosthesis  Neurological: He is alert and oriented to person, place, and time.     Assessment/Plan Pt with ESRD and clotted right arm AVG. Plan is for thrombolysis with possible angioplasty/stenting of graft or placement of new catheter if needed. Details/risks of procedure d/w pt/wife with their understanding and consent.  Gerald Davenport,D KEVIN 11/07/2012, 9:20 AM

## 2012-11-10 ENCOUNTER — Encounter (HOSPITAL_COMMUNITY): Payer: Self-pay | Admitting: *Deleted

## 2012-11-10 ENCOUNTER — Ambulatory Visit (HOSPITAL_COMMUNITY)
Admission: RE | Admit: 2012-11-10 | Discharge: 2012-11-10 | Disposition: A | Discharge status: Home / Self Care | Payer: Medicare PPO | Source: Ambulatory Visit | Attending: Internal Medicine | Admitting: Internal Medicine

## 2012-11-10 ENCOUNTER — Encounter (HOSPITAL_COMMUNITY)
Admission: RE | Disposition: A | Discharge status: Home / Self Care | Payer: Self-pay | Source: Ambulatory Visit | Attending: Internal Medicine

## 2012-11-10 DIAGNOSIS — K6289 Other specified diseases of anus and rectum: Secondary | ICD-10-CM | POA: Insufficient documentation

## 2012-11-10 DIAGNOSIS — K5730 Diverticulosis of large intestine without perforation or abscess without bleeding: Secondary | ICD-10-CM

## 2012-11-10 DIAGNOSIS — D126 Benign neoplasm of colon, unspecified: Secondary | ICD-10-CM | POA: Insufficient documentation

## 2012-11-10 DIAGNOSIS — I10 Essential (primary) hypertension: Secondary | ICD-10-CM | POA: Insufficient documentation

## 2012-11-10 DIAGNOSIS — Z01812 Encounter for preprocedural laboratory examination: Secondary | ICD-10-CM | POA: Insufficient documentation

## 2012-11-10 DIAGNOSIS — Z1211 Encounter for screening for malignant neoplasm of colon: Secondary | ICD-10-CM

## 2012-11-10 HISTORY — PX: COLONOSCOPY: SHX5424

## 2012-11-10 SURGERY — COLONOSCOPY
Anesthesia: Moderate Sedation

## 2012-11-10 MED ORDER — MIDAZOLAM HCL 5 MG/5ML IJ SOLN
INTRAMUSCULAR | Status: DC | PRN
  Administered 2012-11-10 (×2): 2 mg via INTRAVENOUS

## 2012-11-10 MED ORDER — MEPERIDINE HCL 100 MG/ML IJ SOLN
INTRAMUSCULAR | Status: AC
  Filled 2012-11-10: qty 1

## 2012-11-10 MED ORDER — MIDAZOLAM HCL 5 MG/5ML IJ SOLN
INTRAMUSCULAR | Status: AC
  Filled 2012-11-10: qty 10

## 2012-11-10 MED ORDER — SODIUM CHLORIDE 0.45 % IV SOLN: INTRAVENOUS | Status: DC

## 2012-11-10 MED ORDER — MEPERIDINE HCL 100 MG/ML IJ SOLN
INTRAMUSCULAR | Status: DC | PRN
  Administered 2012-11-10: 50 mg via INTRAVENOUS
  Administered 2012-11-10: 25 mg via INTRAVENOUS

## 2012-11-10 MED ORDER — SODIUM CHLORIDE 0.9 % IV SOLN
INTRAVENOUS | Status: DC
  Administered 2012-11-10: 10:00:00 via INTRAVENOUS

## 2012-11-10 MED ORDER — SIMETHICONE 40 MG/0.6ML PO SUSP
ORAL | Status: DC | PRN
  Administered 2012-11-10: 11:00:00

## 2012-11-10 MED ORDER — ONDANSETRON HCL 4 MG/2ML IJ SOLN
INTRAMUSCULAR | Status: AC
  Filled 2012-11-10: qty 2

## 2012-11-10 MED ORDER — ONDANSETRON HCL 4 MG/2ML IJ SOLN
INTRAMUSCULAR | Status: DC | PRN
  Administered 2012-11-10: 4 mg via INTRAVENOUS

## 2012-11-10 NOTE — Interval H&P Note (Signed)
History and Physical Interval Note:  11/10/2012 10:22 AM  Gerald Davenport  has presented today for surgery, with the diagnosis of SCREENING COLONOSCOPY  The various methods of treatment have been discussed with the patient and family. After consideration of risks, benefits and other options for treatment, the patient has consented to  Procedure(s) with comments: COLONOSCOPY (N/A) - 10:30 as a surgical intervention .  The patient's history has been reviewed, patient examined, no change in status, stable for surgery.  I have reviewed the patient's chart and labs.  Questions were answered to the patient's satisfaction.     Gerald Davenport  Screening colonoscopy per plan. The risks, benefits, limitations, alternatives and imponderables have been reviewed with the patient. Questions have been answered. All parties are agreeable.

## 2012-11-10 NOTE — H&P (View-Only) (Signed)
Referring Provider: Benita Stabile, MD Primary Care Physician:  Benita Stabile, MD Primary Gastroenterologist: Dr. Jena Gauss   Chief Complaint  Patient presents with  . Follow-up    HPI:   72 year old male who presents today secondary to fecal incontinence. I saw him in Dec 2013, whereby he reported symptoms for several months. Prior hx of constipation. Last TCS in remote past, but he is unsure who performed this. We have attempted to retrieve records but to no avail.  I started him on fiber supplementation at the last visit and scheduled a colonoscopy. However, he had to cancel this due to a cold. He also reported intermittent reflux, no dysphagia. Noticed some improvement with Metamucil; he is not still taking this. Ran out of samples. He notes improvement of intermittent reflux with Prilosec daily.  No abdominal pain, no rectal bleeding. Denies GERD. No dysphagia. Dialysis Tues, Thurs, Sat. He would like to have the procedure on a Wednesday.   Past Medical History  Diagnosis Date  . Hypertension   . Hyperlipidemia   . Chronic kidney disease     dialysis Tues, Thur, Sat  . Diabetes mellitus   . Cancer     Prostate cancer treated with radiation therapy  . Thyroid disease     hyperparathyroidism  . Anemia     Past Surgical History  Procedure Date  . 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 Av fistula placement 11/04/2010    left and left  . Av fistula placement 11/20/2011    Procedure: INSERTION OF ARTERIOVENOUS (AV) GORE-TEX GRAFT ARM;  Surgeon: Chuck Hint, MD;  Location: [facility] OR;  Service: Vascular;  Laterality: Right;    Current Outpatient Prescriptions  Medication Sig Dispense Refill  . amLODipine (NORVASC) 10 MG tablet Take 10 mg by mouth daily.       Marland Kitchen aspirin 81 MG tablet Take 81 mg by mouth daily.        Marland Kitchen buPROPion (WELLBUTRIN SR) 150 MG 12 hr tablet Take 150 mg by mouth 2 (two) times daily.       . calcitRIOL (ROCALTROL) 0.25 MCG capsule Take  0.25 mcg by mouth daily.       . calcium acetate (PHOSLO) 667 MG capsule Take 667 mg by mouth 3 (three) times daily.       . Calcium Acetate 667 MG TABS Take 667 mg by mouth 3 (three) times daily with meals.      . cloNIDine (CATAPRES) 0.2 MG tablet Take 0.2 mg by mouth 2 (two) times daily.       Marland Kitchen doxazosin (CARDURA) 8 MG tablet Take 8 mg by mouth at bedtime.       . furosemide (LASIX) 40 MG tablet Take 40 mg by mouth 2 (two) times daily.        Marland Kitchen glyBURIDE (DIABETA) 5 MG tablet Take 5 mg by mouth 2 (two) times daily with a meal.        . insulin detemir (LEVEMIR) 100 UNIT/ML injection Inject 12-16 Units into the skin daily. 12-16 units depending on blood sugar      . QUEtiapine (SEROQUEL) 100 MG tablet Take 100 mg by mouth at bedtime.       . ramipril (ALTACE) 10 MG capsule Take 20 mg by mouth daily.      . simvastatin (ZOCOR) 20 MG tablet Take 20 mg by mouth at bedtime.        . Tamsulosin HCl (FLOMAX) 0.4 MG CAPS Take  0.4 mg by mouth.        . telmisartan-hydrochlorothiazide (MICARDIS HCT) 80-12.5 MG per tablet Take 1 tablet by mouth daily.          Allergies as of 11/01/2012  . (No Known Allergies)    Family History  Problem Relation Age of Onset  . Heart disease Mother   . Diabetes Mother   . Hypertension Mother   . Stroke Mother   . Heart disease Father   . Diabetes Father   . Heart disease Sister   . Diabetes Brother   . Colon cancer Neg Hx     History   Social History  . Marital Status: Married    Spouse Name: N/A    Number of Children: N/A  . Years of Education: N/A   Social History Main Topics  . Smoking status: Current Every Day Smoker -- 1.0 packs/day for 50 years    Types: Cigarettes  . Smokeless tobacco: None  . Alcohol Use: No  . Drug Use: No  . Sexually Active: None   Other Topics Concern  . None   Social History Narrative  . None    Review of Systems: Negative unless otherwise mentioned in HPI.  Physical Exam: BP 167/73  Pulse 73  Temp  97.6 F (36.4 C) (Oral)  Ht 5\' 8"  (1.727 m)  Wt 174 lb 3.2 oz (79.017 kg)  BMI 26.49 kg/m2 General:   Alert and oriented. No distress noted. Pleasant and cooperative.  Head:  Normocephalic and atraumatic. Eyes:  Conjuctiva clear without scleral icterus. Mouth:  Oral mucosa pink and moist. Good dentition. No lesions. Neck:  Supple, without mass or thyromegaly. Heart:  S1, S2 present without murmurs, rubs, or gallops. Regular rate and rhythm. Abdomen:  +BS, soft, non-tender and non-distended. No rebound or guarding. No HSM noted. Questionable ventral hernia  Msk:  Symmetrical without gross deformities. Right BKA with prosthesis.  Extremities:  Left lower extremity without edema, Right BKA prosthesis Neurologic:  Alert and  oriented x4;  grossly normal neurologically. Skin:  Intact without significant lesions or rashes. Cervical Nodes:  No significant cervical adenopathy. Psych:  Alert and cooperative. Normal mood and affect.

## 2012-11-10 NOTE — Op Note (Signed)
[facility] [address] [place], [address]   COLONOSCOPY PROCEDURE REPORT  PATIENT: Davenport, Gerald  MR#:         [id number] BIRTHDATE: 04/28/1941 , 71  yrs. old GENDER: Male ENDOSCOPIST: R.  Roetta Sessions, MD FACP FACG REFERRED BY:  Lupe Carney, M.D. PROCEDURE DATE:  11/10/2012 PROCEDURE:     Colonoscopy biopsy and snare polypectomy  INDICATIONS: Average risk screening colonoscopy  INFORMED CONSENT:  The risks, benefits, alternatives and imponderables including but not limited to bleeding, perforation as well as the possibility of a missed lesion have been reviewed.  The potential for biopsy, lesion removal, etc. have also been discussed.  Questions have been answered.  All parties agreeable. Please see the history and physical in the medical record for more information.  MEDICATIONS: Versed 4 mg IV and Demerol 75 mg IV in divided doses. Zofran 4 mg IV.  DESCRIPTION OF PROCEDURE:  After a digital rectal exam was performed, the Pentax Colonoscope [id number]  colonoscope was advanced from the anus through the rectum and colon to the area of the cecum, ileocecal valve and appendiceal orifice.  The cecum was deeply intubated.  These structures were well-seen and photographed for the record.  From the level of the cecum and ileocecal valve, the scope was slowly and cautiously withdrawn.  The mucosal surfaces were carefully surveyed utilizing scope tip deflection to facilitate fold flattening as needed.  The scope was pulled down into the rectum where a thorough examination including retroflexion was performed.    FINDINGS:  Preparation was inadequate for complete examination. Rectal mucosa incompletely seen because of poor preparation. 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  Pancolonic diverticulosis.  Pancolonic diverticulosis. (1) 5 mm polyp in the transverse segment (2) 4 mm polyps in the base of the cecum and (1)  diminutive polyp in the descending segment; the remainder of the colonic mucosa appeared grossly normal but, again, all mucosal surfaces were not seen because of a poor preparation.  THERAPEUTIC / DIAGNOSTIC MANEUVERS PERFORMED:  The above-mentioned polyps were hot snaree, cold snared and cold biopsied / removed, respectively.  COMPLICATIONS: None  CECAL WITHDRAWAL TIME:  7 minute  IMPRESSION:  Radiation-induced proctitis. Pancolonic diverticulosis. Colonic polyps-removed as described above.  Inadequate preparation compromised examination.  RECOMMENDATIONS:   Follow up on pathology. Further recommendations to follow.   _______________________________ eSigned:  R. Roetta Sessions, MD FACP [facility] 11/10/2012 11:12 AM   CC:    PATIENT NAME:  Gerald, Davenport MR#: [id number]

## 2012-11-14 ENCOUNTER — Encounter: Payer: Self-pay | Admitting: Internal Medicine

## 2012-11-15 ENCOUNTER — Encounter (HOSPITAL_COMMUNITY): Payer: Self-pay | Admitting: Internal Medicine

## 2012-11-15 ENCOUNTER — Encounter: Payer: Self-pay | Admitting: *Deleted

## 2012-11-29 ENCOUNTER — Telehealth: Payer: Self-pay | Admitting: *Deleted

## 2012-11-29 NOTE — Telephone Encounter (Signed)
Error

## 2013-01-07 ENCOUNTER — Other Ambulatory Visit: Payer: Self-pay | Admitting: Nephrology

## 2013-01-07 DIAGNOSIS — N186 End stage renal disease: Secondary | ICD-10-CM

## 2013-01-08 ENCOUNTER — Other Ambulatory Visit: Payer: Self-pay | Admitting: Nephrology

## 2013-01-08 ENCOUNTER — Encounter (HOSPITAL_COMMUNITY): Payer: Self-pay

## 2013-01-08 ENCOUNTER — Ambulatory Visit (HOSPITAL_COMMUNITY)
Admission: RE | Admit: 2013-01-08 | Discharge: 2013-01-08 | Disposition: A | Discharge status: Home / Self Care | Payer: Medicare PPO | Source: Ambulatory Visit | Attending: Nephrology | Admitting: Nephrology

## 2013-01-08 DIAGNOSIS — N186 End stage renal disease: Secondary | ICD-10-CM

## 2013-01-08 DIAGNOSIS — E785 Hyperlipidemia, unspecified: Secondary | ICD-10-CM | POA: Insufficient documentation

## 2013-01-08 DIAGNOSIS — Z79899 Other long term (current) drug therapy: Secondary | ICD-10-CM | POA: Insufficient documentation

## 2013-01-08 DIAGNOSIS — D649 Anemia, unspecified: Secondary | ICD-10-CM | POA: Insufficient documentation

## 2013-01-08 DIAGNOSIS — F17200 Nicotine dependence, unspecified, uncomplicated: Secondary | ICD-10-CM | POA: Insufficient documentation

## 2013-01-08 DIAGNOSIS — S88119A Complete traumatic amputation at level between knee and ankle, unspecified lower leg, initial encounter: Secondary | ICD-10-CM | POA: Insufficient documentation

## 2013-01-08 DIAGNOSIS — Y832 Surgical operation with anastomosis, bypass or graft as the cause of abnormal reaction of the patient, or of later complication, without mention of misadventure at the time of the procedure: Secondary | ICD-10-CM | POA: Insufficient documentation

## 2013-01-08 DIAGNOSIS — I120 Hypertensive chronic kidney disease with stage 5 chronic kidney disease or end stage renal disease: Secondary | ICD-10-CM | POA: Insufficient documentation

## 2013-01-08 DIAGNOSIS — I82619 Acute embolism and thrombosis of superficial veins of unspecified upper extremity: Secondary | ICD-10-CM | POA: Insufficient documentation

## 2013-01-08 DIAGNOSIS — Z7982 Long term (current) use of aspirin: Secondary | ICD-10-CM | POA: Insufficient documentation

## 2013-01-08 DIAGNOSIS — T82898A Other specified complication of vascular prosthetic devices, implants and grafts, initial encounter: Secondary | ICD-10-CM | POA: Insufficient documentation

## 2013-01-08 DIAGNOSIS — E119 Type 2 diabetes mellitus without complications: Secondary | ICD-10-CM | POA: Insufficient documentation

## 2013-01-08 DIAGNOSIS — Z794 Long term (current) use of insulin: Secondary | ICD-10-CM | POA: Insufficient documentation

## 2013-01-08 DIAGNOSIS — E213 Hyperparathyroidism, unspecified: Secondary | ICD-10-CM | POA: Insufficient documentation

## 2013-01-08 LAB — POTASSIUM: Potassium: 5 mEq/L (ref 3.5–5.1)

## 2013-01-08 MED ORDER — IOHEXOL 300 MG/ML  SOLN
100.0000 mL | Freq: Once | INTRAMUSCULAR | Status: AC | PRN
  Administered 2013-01-08: 50 mL via INTRAVENOUS

## 2013-01-08 MED ORDER — MIDAZOLAM HCL 2 MG/2ML IJ SOLN
INTRAMUSCULAR | Status: AC | PRN
  Administered 2013-01-08: 1 mg via INTRAVENOUS

## 2013-01-08 MED ORDER — HEPARIN SODIUM (PORCINE) 1000 UNIT/ML IJ SOLN
INTRAMUSCULAR | Status: AC
  Administered 2013-01-08: 3000 [IU]
  Filled 2013-01-08: qty 1

## 2013-01-08 MED ORDER — MIDAZOLAM HCL 2 MG/2ML IJ SOLN
INTRAMUSCULAR | Status: AC
  Filled 2013-01-08: qty 2

## 2013-01-08 MED ORDER — ALTEPLASE 100 MG IV SOLR
2.0000 mg | Freq: Once | INTRAVENOUS | Status: AC
  Administered 2013-01-08: 2 mg
  Filled 2013-01-08: qty 2

## 2013-01-08 MED ORDER — FENTANYL CITRATE 0.05 MG/ML IJ SOLN
INTRAMUSCULAR | Status: AC
  Filled 2013-01-08: qty 2

## 2013-01-08 MED ORDER — FENTANYL CITRATE 0.05 MG/ML IJ SOLN
INTRAMUSCULAR | Status: AC | PRN
  Administered 2013-01-08: 50 ug via INTRAVENOUS

## 2013-01-08 NOTE — H&P (Signed)
Gerald Davenport is an 72 y.o. male.   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  Past Medical History  Diagnosis Date  . Hypertension   . Hyperlipidemia   . Chronic kidney disease     dialysis Tues, Thur, Sat  . Diabetes mellitus   . Cancer     Prostate cancer treated with radiation therapy  . Thyroid disease     hyperparathyroidism  . Anemia     Past Surgical History  Procedure Laterality Date  . 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 Av fistula placement  11/04/2010    left and left  . Av fistula placement  11/20/2011    Procedure: INSERTION OF ARTERIOVENOUS (AV) GORE-TEX GRAFT ARM;  Surgeon: Chuck Hint, MD;  Location: [facility] OR;  Service: Vascular;  Laterality: Right;  . Colonoscopy N/A 11/10/2012    Procedure: COLONOSCOPY;  Surgeon: Corbin Ade, MD;  [facility];  Service: Endoscopy;  Laterality: N/A;  10:30    Family History  Problem Relation Age of Onset  . Heart disease Mother   . Diabetes Mother   . Hypertension Mother   . Stroke Mother   . Heart disease Father   . Diabetes Father   . Heart disease Sister   . Diabetes Brother   . Colon cancer Neg Hx    Social History:  reports that he has been smoking Cigarettes.  He has a 50 pack-year smoking history. He does not have any smokeless tobacco history on file. He reports that he does not drink alcohol or use illicit drugs.  Allergies: No Known Allergies Current outpatient prescriptions:amLODipine (NORVASC) 10 MG tablet, Take 10 mg by mouth daily. , Disp: , Rfl: ;  aspirin 81 MG tablet, Take 81 mg by mouth daily.  , Disp: , Rfl: ;  buPROPion (WELLBUTRIN SR) 150 MG 12 hr tablet, Take 150 mg by mouth 2 (two) times daily. , Disp: , Rfl: ;  calcitRIOL (ROCALTROL) 0.25 MCG capsule, Take 0.25 mcg by mouth daily. , Disp: , Rfl:   calcium acetate (PHOSLO) 667 MG capsule, Take 667 mg by mouth 3 (three) times daily. , Disp: , Rfl: ;  Calcium Acetate 667 MG TABS, Take 667 mg by mouth 3 (three) times daily with meals., Disp: , Rfl: ;  cloNIDine (CATAPRES) 0.2 MG tablet, Take 0.2 mg by mouth 2 (two) times daily. , Disp: , Rfl: ;  doxazosin (CARDURA) 8 MG tablet, Take 8 mg by mouth at bedtime. , Disp: , Rfl:  furosemide (LASIX) 40 MG tablet, Take 40 mg by mouth 2 (two) times daily.  , Disp: , Rfl: ;  glyBURIDE (DIABETA) 5 MG tablet, Take 5 mg by mouth 2 (two) times daily with a meal.  , Disp: , Rfl: ;  insulin detemir (LEVEMIR) 100 UNIT/ML injection, Inject 12-16 Units into the skin daily. 12-16 units depending on blood sugar, Disp: , Rfl: ;  QUEtiapine (SEROQUEL) 100 MG tablet, Take 100 mg by mouth at bedtime. , Disp: , Rfl:  ramipril (ALTACE) 10 MG capsule, Take 20 mg by mouth daily., Disp: , Rfl: ;  simvastatin (ZOCOR) 20 MG tablet, Take 20 mg by mouth at bedtime.  , Disp: , Rfl: ;  Tamsulosin HCl (FLOMAX) 0.4 MG CAPS, Take 0.4 mg by mouth.  , Disp: , Rfl: ;  telmisartan-hydrochlorothiazide (MICARDIS HCT) 80-12.5 MG per tablet, Take 1 tablet by mouth daily.  , Disp: , Rfl:  Current facility-administered medications:alteplase (ACTIVASE) injection 2 mg, 2 mg, Intracatheter, Once, Abundio Miu, MD    Review of Systems  Constitutional: Negative for fever and chills.  Respiratory: Negative for cough and shortness of breath.   Cardiovascular: Negative for chest pain.  Gastrointestinal: Negative for nausea, vomiting and abdominal pain.  Musculoskeletal: Negative for back pain.  Neurological: Negative for headaches.  Endo/Heme/Allergies: Does not bruise/bleed easily.     Physical Exam  Constitutional: He is oriented to person, place, and time. He appears well-developed and well-nourished.  Cardiovascular: Normal rate and regular rhythm.   Rt upper arm AVG with no thrill/bruit  Respiratory: Effort normal.  Sl dim BS bases bilat   GI: Soft. Bowel sounds are normal. There is no tenderness.  Musculoskeletal: Normal range of motion.  Rt BKA with prosthesis  Neurological: He is alert and oriented to person, place, and time.     Assessment/Plan Pt with ESRD and clotted right arm AVG. Plan is for thrombolysis with possible angioplasty/stenting of graft or placement of new catheter if needed. Details/risks of procedure d/w pt/wife with their understanding and consent.  Brayton El 01/08/2013, 7:54 AM

## 2013-01-08 NOTE — Procedures (Signed)
Successful declot of right upper arm AV graft.  Balloon angioplasty with 7 mm balloon.  No immediate complication.

## 2013-01-08 NOTE — ED Notes (Signed)
Potassium results called to [facility] no new orders received at this time.

## 2013-01-18 ENCOUNTER — Telehealth: Payer: Self-pay

## 2013-01-18 NOTE — Telephone Encounter (Signed)
Pt came by office- he stated he is having problems controlling his BM's again. This has been going on for about 2 days. He is having some discomfort in his lower abd. No blood in his stool, no N/V, no fever. He had stopped taking his prilosec. I advised him that he was supposed to be taking it everyday. He said he would start taking it again but he was concerned about the fecal incontinence. I told him that I would call him tomorrow because AS was not in the office today. He said that was fine.

## 2013-01-20 MED ORDER — MESALAMINE 1000 MG RE SUPP: 1000.0000 mg | Freq: Every day | RECTAL | Status: DC

## 2013-01-20 NOTE — Telephone Encounter (Signed)
Looks like on his colonoscopy, he had evidence of radiation proctitis. This likely isn't causing his issues, but let's do a course of canasa suppositories each evening for 1 month. Have him follow-up with [place] in about 3-4 weeks.   Needs to add metamucil or benefiber daily.  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 When he follows up, we can discuss that.

## 2013-01-20 NOTE — Telephone Encounter (Signed)
Pt came by office. He has been informed of recommendations. SS is going to make him an appointment before he leaves.

## 2013-01-20 NOTE — Telephone Encounter (Signed)
Tried to call pt- LM with male to return call. 

## 2013-01-24 NOTE — Telephone Encounter (Signed)
Appt made 02/16/13@3 :00p with AS

## 2013-02-03 ENCOUNTER — Encounter: Payer: Self-pay | Admitting: Internal Medicine

## 2013-02-07 ENCOUNTER — Telehealth: Payer: Self-pay | Admitting: Gastroenterology

## 2013-02-07 ENCOUNTER — Ambulatory Visit: Payer: Medicare PPO | Admitting: Gastroenterology

## 2013-02-07 NOTE — Telephone Encounter (Signed)
Pt was a no show

## 2013-02-08 NOTE — Telephone Encounter (Signed)
Please send letter for f/u.  

## 2013-02-17 ENCOUNTER — Encounter: Payer: Self-pay | Admitting: Gastroenterology

## 2013-02-17 NOTE — Telephone Encounter (Signed)
Mailed letter to patient to call our office to [facility] °

## 2013-02-24 ENCOUNTER — Ambulatory Visit: Payer: Medicare PPO | Admitting: Gastroenterology

## 2013-03-02 ENCOUNTER — Encounter: Payer: Self-pay | Admitting: Gastroenterology

## 2013-03-02 ENCOUNTER — Ambulatory Visit (INDEPENDENT_AMBULATORY_CARE_PROVIDER_SITE_OTHER): Payer: Medicare PPO | Admitting: Gastroenterology

## 2013-03-02 VITALS — BP 144/66 | HR 74 | Temp 98.4°F | Ht 67.0 in | Wt 173.4 lb

## 2013-03-02 DIAGNOSIS — R159 Full incontinence of feces: Secondary | ICD-10-CM

## 2013-03-02 NOTE — Progress Notes (Signed)
Referring Provider: Benita Stabile, MD Primary Care Physician:  Benita Stabile, MD Primary GI: Dr. Jena Gauss   Chief Complaint  Patient presents with  . Follow-up    HPI:   Gerald Davenport presents today in follow-up after surveillance colonoscopy. He was found to have radiation-induced proctitis and tubular adenomas. Due to poor prep, he will need early interval colonoscopy Feb 2015. He has been having issues with fecal incontinence; noted improvement with Metamucil prior to colonoscopy. He called our office in April, stated he had started having issues with incontinence again. I started him on Canasa suppositories X 1 month due to finding of radiation proctitis. Asked him to add supplemental fiber.  He denies any diarrhea or loose stools currently. Not taking fiber. Intermittent constipation noted. Sometimes stool is hard. BM daily to every other day. Sometimes tries to "hold it but can't", results in a small smear in underwear. Taking Prilosec each morning, which is doing well to control GERD symptoms.   Past Medical History  Diagnosis Date  . Hypertension   . Hyperlipidemia   . Chronic kidney disease     dialysis Tues, Thur, Sat  . Diabetes mellitus   . Cancer     Prostate cancer treated with radiation therapy  . Thyroid disease     hyperparathyroidism  . Anemia     Past Surgical History  Procedure Laterality Date  . 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 Av fistula placement  11/04/2010    left and left  . Av fistula placement  11/20/2011    Procedure: INSERTION OF ARTERIOVENOUS (AV) GORE-TEX GRAFT ARM;  Surgeon: Chuck Hint, MD;  Location: [facility] OR;  Service: Vascular;  Laterality: Right;  . Colonoscopy N/A 11/10/2012    [id number] proctitis/Pancolonic diverticulosis/(TUBULAR ADENOMA)5 mm polyp in the transverse segment/4 mm polyps in the base of the cecum and diminutive polyp in the descending segment    Current Outpatient Prescriptions  Medication  Sig Dispense Refill  . amLODipine (NORVASC) 10 MG tablet Take 10 mg by mouth daily.       Marland Kitchen aspirin 81 MG tablet Take 81 mg by mouth daily.        Marland Kitchen buPROPion (WELLBUTRIN SR) 150 MG 12 hr tablet Take 150 mg by mouth 2 (two) times daily.       . calcium acetate (PHOSLO) 667 MG capsule Take 667 mg by mouth 3 (three) times daily.       . cloNIDine (CATAPRES) 0.2 MG tablet Take 0.2 mg by mouth 2 (two) times daily.       Marland Kitchen doxazosin (CARDURA) 8 MG tablet Take 8 mg by mouth at bedtime.       Marland Kitchen glyBURIDE (DIABETA) 5 MG tablet Take 5 mg by mouth 2 (two) times daily with a meal.        . insulin detemir (LEVEMIR) 100 UNIT/ML injection Inject 12-16 Units into the skin daily. 12-16 units depending on blood sugar      . QUEtiapine (SEROQUEL) 100 MG tablet Take 100 mg by mouth at bedtime.       . ramipril (ALTACE) 10 MG capsule Take 20 mg by mouth daily.      . saxagliptin HCl (ONGLYZA) 5 MG TABS tablet Take 5 mg by mouth daily.      . simvastatin (ZOCOR) 20 MG tablet Take 20 mg by mouth at bedtime.        . Tamsulosin HCl (FLOMAX) 0.4 MG CAPS Take 0.4 mg by  mouth.        . telmisartan-hydrochlorothiazide (MICARDIS HCT) 80-12.5 MG per tablet Take 1 tablet by mouth daily.        . Vitamin D, Ergocalciferol, (DRISDOL) 50000 UNITS CAPS Take 50,000 Units by mouth.      . calcitRIOL (ROCALTROL) 0.25 MCG capsule Take 0.25 mcg by mouth daily.       . Calcium Acetate 667 MG TABS Take 667 mg by mouth 3 (three) times daily with meals.      . furosemide (LASIX) 40 MG tablet Take 40 mg by mouth 2 (two) times daily.        . mesalamine (CANASA) 1000 MG suppository Place 1 suppository (1,000 mg total) rectally at bedtime.  30 suppository  1   No current facility-administered medications for this visit.    Allergies as of 03/02/2013  . (No Known Allergies)    Family History  Problem Relation Age of Onset  . Heart disease Mother   . Diabetes Mother   . Hypertension Mother   . Stroke Mother   . Heart disease  Father   . Diabetes Father   . Heart disease Sister   . Diabetes Brother   . Colon cancer Neg Hx     History   Social History  . Marital Status: Married    Spouse Name: N/A    Number of Children: N/A  . Years of Education: N/A   Social History Main Topics  . Smoking status: Current Every Day Smoker -- 1.00 packs/day for 50 years    Types: Cigarettes  . Smokeless tobacco: None  . Alcohol Use: No  . Drug Use: No  . Sexually Active: None   Other Topics Concern  . None   Social History Narrative  . None    Review of Systems: Negative unless mentioned in HPI.   Physical Exam: BP 144/66  Pulse 74  Temp(Src) 98.4 F (36.9 C) (Oral)  Ht 5\' 7"  (1.702 m)  Wt 173 lb 6.4 oz (78.654 kg)  BMI 27.15 kg/m2 General:   Alert and oriented. No distress noted. Pleasant and cooperative.  Head:  Normocephalic and atraumatic. Eyes:  Conjuctiva clear without scleral icterus. Heart:  S1, S2 present without murmurs, rubs, or gallops. Regular rate and rhythm. Abdomen:  +BS, soft, non-tender and non-distended. No rebound or guarding. No HSM or masses noted. Extremities:  Right BKA prosthesis  Neurologic:  Alert and  oriented x4;  grossly normal neurologically. Psych:  Alert and cooperative. Normal mood and affect.

## 2013-03-02 NOTE — Patient Instructions (Addendum)
I want you to take the suppositories each night until they are all gone. We won't use anymore after that.  Keep taking Prilosec each morning, 30 minutes before breakfast. This is for your stomach and reflux.  I want you to add fiber to your diet by using Benefiber or Metamucil each day. This will help your stool with consistency. If you start having diarrhea or worsening constipation, please call me right away.  We will see you in 3 months!

## 2013-03-03 NOTE — Assessment & Plan Note (Signed)
72 year old male with constipation, intermittent incontinence of formed stool, which seems to be secondary to urgency when he needs to have a BM after bout of constipation. No diarrhea, abdominal pain. He is not taking supplemental fiber. May be a good candidate for low-dose Linzess in the future, but I have asked him to start supplemental fiber daily. Provided Metamucil samples. Continue Prilosec daily. Contact us if no improvement and trial Linzess if necessary. Otherwise, we will see him back in 3 months. He is to have an early interval colonoscopy in Feb 2015, due to recent colonoscopy with poor prep.

## 2013-03-04 NOTE — Progress Notes (Signed)
Cc PCP 

## 2013-03-22 ENCOUNTER — Ambulatory Visit (HOSPITAL_COMMUNITY)
Admission: RE | Admit: 2013-03-22 | Discharge: 2013-03-22 | Disposition: A | Discharge status: Home / Self Care | Payer: Medicare PPO | Source: Ambulatory Visit | Attending: Nephrology | Admitting: Nephrology

## 2013-03-22 ENCOUNTER — Other Ambulatory Visit (HOSPITAL_COMMUNITY): Payer: Self-pay | Admitting: Nephrology

## 2013-03-22 DIAGNOSIS — R0602 Shortness of breath: Secondary | ICD-10-CM | POA: Insufficient documentation

## 2013-03-22 DIAGNOSIS — I10 Essential (primary) hypertension: Secondary | ICD-10-CM | POA: Insufficient documentation

## 2013-03-22 DIAGNOSIS — E119 Type 2 diabetes mellitus without complications: Secondary | ICD-10-CM | POA: Insufficient documentation

## 2013-05-07 ENCOUNTER — Other Ambulatory Visit: Payer: Self-pay | Admitting: Gastroenterology

## 2013-05-09 ENCOUNTER — Encounter: Payer: Self-pay | Admitting: Internal Medicine

## 2013-06-15 ENCOUNTER — Ambulatory Visit (INDEPENDENT_AMBULATORY_CARE_PROVIDER_SITE_OTHER): Payer: Medicare PPO | Admitting: Gastroenterology

## 2013-06-15 ENCOUNTER — Encounter: Payer: Self-pay | Admitting: Gastroenterology

## 2013-06-15 VITALS — BP 138/59 | HR 84 | Temp 98.1°F | Ht 69.0 in | Wt 173.6 lb

## 2013-06-15 DIAGNOSIS — K5900 Constipation, unspecified: Secondary | ICD-10-CM | POA: Insufficient documentation

## 2013-06-15 MED ORDER — LINACLOTIDE 145 MCG PO CAPS: 145.0000 ug | ORAL_CAPSULE | Freq: Every day | ORAL | Status: DC

## 2013-06-15 NOTE — Assessment & Plan Note (Signed)
72 year old with constipation that has responded to addition of supplemental fiber but still with less than ideal bowel regimen. Desires to try prescriptive medication. No rectal bleeding noted, no abdominal pain.   Linzess 145 mcg daily, voucher provided Early interval TCS Feb 2015 due to poor prep.  Return in Jan 2015.  As of note, ventral hernia noted on exam. No abdominal imaging on file. Hold off on imaging unless progresses and/or associated with abdominal pain.

## 2013-06-15 NOTE — Progress Notes (Signed)
Referring Provider: Benita Stabile, MD Primary Care Physician:  Benita Stabile, MD Primary GI: Dr. Jena Gauss   Chief Complaint  Patient presents with  . Follow-up    HPI:   72 year old male returns today in routine follow-up due to constipation, started on supplemental fiber at last visit. 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   Feels much improved. Feels like bowel movements are more regular. Rare smears in underwear. Sometimes twice a day bowel movements. Sometimes skips. Has to take women's laxative (his wife's) about twice a week and wants to try a prescription for constipation. No abdominal pain, rectal bleeding. No N/V.   Past Medical History  Diagnosis Date  . Hypertension   . Hyperlipidemia   . Chronic kidney disease     dialysis Tues, Thur, Sat  . Diabetes mellitus   . Cancer     Prostate cancer treated with radiation therapy  . Thyroid disease     hyperparathyroidism  . Anemia     Past Surgical History  Procedure Laterality Date  . 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 Av fistula placement  11/04/2010    left and left  . Av fistula placement  11/20/2011    Procedure: INSERTION OF ARTERIOVENOUS (AV) GORE-TEX GRAFT ARM;  Surgeon: Chuck Hint, MD;  Location: [facility] OR;  Service: Vascular;  Laterality: Right;  . Colonoscopy N/A 11/10/2012    [id number] proctitis/Pancolonic diverticulosis/(TUBULAR ADENOMA)5 mm polyp in the transverse segment/4 mm polyps in the base of the cecum and diminutive polyp in the descending segment    Current Outpatient Prescriptions  Medication Sig Dispense Refill  . amLODipine (NORVASC) 10 MG tablet Take 10 mg by mouth daily.       Marland Kitchen aspirin 81 MG tablet Take 81 mg by mouth daily.        Marland Kitchen buPROPion (WELLBUTRIN SR) 150 MG 12 hr tablet Take 150 mg by mouth 2 (two) times daily.       . calcitRIOL (ROCALTROL) 0.25 MCG capsule  Take 0.25 mcg by mouth daily.       . calcium acetate (PHOSLO) 667 MG capsule Take 667 mg by mouth 3 (three) times daily.       . Calcium Acetate 667 MG TABS Take 667 mg by mouth 3 (three) times daily with meals.      . cloNIDine (CATAPRES) 0.2 MG tablet Take 0.2 mg by mouth 2 (two) times daily.       Marland Kitchen doxazosin (CARDURA) 8 MG tablet Take 8 mg by mouth at bedtime.       . furosemide (LASIX) 40 MG tablet Take 40 mg by mouth 2 (two) times daily.        Marland Kitchen glyBURIDE (DIABETA) 5 MG tablet Take 5 mg by mouth 2 (two) times daily with a meal.        . insulin detemir (LEVEMIR) 100 UNIT/ML injection Inject 12-16 Units into the skin daily. 12-16 units depending on blood sugar      . mesalamine (CANASA) 1000 MG suppository Place 1 suppository (1,000 mg total) rectally at bedtime.  30 suppository  1  . omeprazole (PRILOSEC) 20 MG capsule TAKE 1 CAPSULE BY MOUTH DAILY. 30 MINUTES BEFORE BREAKFAST  30 capsule  11  . QUEtiapine (SEROQUEL) 100 MG tablet Take 100 mg by mouth at bedtime.       . ramipril (ALTACE) 10 MG capsule Take 20 mg  by mouth daily.      . saxagliptin HCl (ONGLYZA) 5 MG TABS tablet Take 5 mg by mouth daily.      . simvastatin (ZOCOR) 20 MG tablet Take 20 mg by mouth at bedtime.        . Tamsulosin HCl (FLOMAX) 0.4 MG CAPS Take 0.4 mg by mouth.        . telmisartan-hydrochlorothiazide (MICARDIS HCT) 80-12.5 MG per tablet Take 1 tablet by mouth daily.        . Vitamin D, Ergocalciferol, (DRISDOL) 50000 UNITS CAPS Take 50,000 Units by mouth.       No current facility-administered medications for this visit.    Allergies as of 06/15/2013  . (No Known Allergies)    Family History  Problem Relation Age of Onset  . Heart disease Mother   . Diabetes Mother   . Hypertension Mother   . Stroke Mother   . Heart disease Father   . Diabetes Father   . Heart disease Sister   . Diabetes Brother   . Colon cancer Neg Hx     History   Social History  . Marital Status: Married    Spouse  Name: N/A    Number of Children: N/A  . Years of Education: N/A   Social History Main Topics  . Smoking status: Current Every Day Smoker -- 1.00 packs/day for 50 years    Types: Cigarettes  . Smokeless tobacco: None  . Alcohol Use: No  . Drug Use: No  . Sexual Activity: None   Other Topics Concern  . None   Social History Narrative  . None    Review of Systems: Negative unless mentioned in HPI.   Physical Exam: BP 138/59  Pulse 84  Temp(Src) 98.1 F (36.7 C) (Oral)  Ht 5\' 9"  (1.753 m)  Wt 173 lb 9.6 oz (78.744 kg)  BMI 25.62 kg/m2 General:   Alert and oriented. No distress noted. Pleasant and cooperative.  Head:  Normocephalic and atraumatic. Eyes:  Conjuctiva clear without scleral icterus. Heart:  S1, S2 present without murmurs Abdomen:  +BS, soft, non-tender and non-distended. No rebound or guarding. Ventral hernia, easily reducible to left of umbilicus.  Extremities:  Right BKA prosthesis.  Neurologic:  Alert and  oriented x4;  grossly normal neurologically.

## 2013-06-15 NOTE — Patient Instructions (Addendum)
Start taking Linzess 1 capsule each morning, 30 minutes before breakfast. This is for constipation. Make sure you take this every day so you can stay "ahead" of constipation.   We will see you back in Jan 2015! Please call if you have any questions or need to change the medication in the meantime.

## 2013-06-16 NOTE — Progress Notes (Signed)
CC'd to PCP 

## 2013-08-10 ENCOUNTER — Other Ambulatory Visit: Payer: Self-pay

## 2013-08-10 MED ORDER — LINACLOTIDE 145 MCG PO CAPS: 145.0000 ug | ORAL_CAPSULE | Freq: Every day | ORAL | Status: DC

## 2013-10-17 ENCOUNTER — Telehealth: Payer: Self-pay | Admitting: *Deleted

## 2013-10-17 NOTE — Telephone Encounter (Signed)
Pt came into office with his letter, pt needs to schedule a procedure. Please advise [phone]

## 2013-10-18 NOTE — Telephone Encounter (Signed)
Pt's appointment is 11/07/13 at 11:00 with LSL

## 2013-10-18 NOTE — Telephone Encounter (Signed)
LMOM to call. He needs an appt to come in per Anna's last OV note, and then set up colonoscopy.

## 2013-11-07 ENCOUNTER — Ambulatory Visit (INDEPENDENT_AMBULATORY_CARE_PROVIDER_SITE_OTHER): Payer: Medicare PPO | Admitting: Gastroenterology

## 2013-11-07 ENCOUNTER — Encounter: Payer: Self-pay | Admitting: *Deleted

## 2013-11-07 ENCOUNTER — Encounter: Payer: Self-pay | Admitting: Gastroenterology

## 2013-11-07 ENCOUNTER — Encounter (INDEPENDENT_AMBULATORY_CARE_PROVIDER_SITE_OTHER): Payer: Self-pay

## 2013-11-07 VITALS — BP 135/54 | HR 71 | Temp 98.3°F | Ht 67.0 in | Wt 171.0 lb

## 2013-11-07 DIAGNOSIS — Z860100 Personal history of colon polyps, unspecified: Secondary | ICD-10-CM

## 2013-11-07 DIAGNOSIS — K5900 Constipation, unspecified: Secondary | ICD-10-CM

## 2013-11-07 DIAGNOSIS — Z860101 Personal history of adenomatous and serrated colon polyps: Secondary | ICD-10-CM | POA: Insufficient documentation

## 2013-11-07 MED ORDER — PEG 3350-KCL-NA BICARB-NACL 420 G PO SOLR: 4000.0000 mL | ORAL | Status: DC

## 2013-11-07 NOTE — Assessment & Plan Note (Addendum)
Constipation better controlled. Continue Linzess 14mcg daily. H/O adenomatous colon polyps. Due for short interval f/u given poor prep previously. Discussed prep at length with the patient. Previously did not start his prep appropriately. Will provide modified instructions to ensure adequate to bowel preparation.  I have discussed the risks, alternatives, benefits with regards to but not limited to the risk of reaction to medication, bleeding, infection, perforation and the patient is agreeable to proceed. Written consent to be obtained.

## 2013-11-07 NOTE — Progress Notes (Signed)
Primary Care Physician:  Donnie Coffin, MD  Primary Gastroenterologist:  Garfield Cornea, MD   Chief Complaint  Patient presents with  . Follow-up    HPI:  Gerald Davenport is a 73 y.o. male here for f/u to schedule repeat colonoscopy. Last seen in 04/2013 for constipation. Has h/o radiation proctitis and tubular adenomas last colonoscopy, needs early interval colonoscopy due to prior poor prep. Started on Linzess 142mcg daily at last OV. Seems to be doing well on this medication but he admits that he does not take it on his dialysis days. He is having bowel movements at least 5 days a week. Stools are soft. Denies any melena rectal bleeding. Denies abdominal pain, vomiting. Heartburn well-controlled. No dysphagia or odynophagia.    Current Outpatient Prescriptions  Medication Sig Dispense Refill  . amLODipine (NORVASC) 10 MG tablet Take 10 mg by mouth daily.       Marland Kitchen aspirin 81 MG tablet Take 81 mg by mouth daily.        Marland Kitchen buPROPion (WELLBUTRIN SR) 150 MG 12 hr tablet Take 150 mg by mouth 2 (two) times daily.       . calcitRIOL (ROCALTROL) 0.25 MCG capsule Take 0.25 mcg by mouth daily.       . calcium acetate (PHOSLO) 667 MG capsule Take 667 mg by mouth 3 (three) times daily.       . Calcium Acetate 667 MG TABS Take 667 mg by mouth 3 (three) times daily with meals.      . cloNIDine (CATAPRES) 0.2 MG tablet Take 0.2 mg by mouth 2 (two) times daily.       Marland Kitchen doxazosin (CARDURA) 8 MG tablet Take 8 mg by mouth at bedtime.       . furosemide (LASIX) 40 MG tablet Take 40 mg by mouth 2 (two) times daily.        Marland Kitchen glyBURIDE (DIABETA) 5 MG tablet Take 5 mg by mouth 2 (two) times daily with a meal.        . insulin detemir (LEVEMIR) 100 UNIT/ML injection Inject 12-16 Units into the skin daily. 12-16 units depending on blood sugar      . Linaclotide (LINZESS) 145 MCG CAPS capsule Take 1 capsule (145 mcg total) by mouth daily. Take 30 minutes before breakfast  30 capsule  11  . omeprazole (PRILOSEC) 20 MG  capsule TAKE 1 CAPSULE BY MOUTH DAILY. 30 MINUTES BEFORE BREAKFAST  30 capsule  11  . QUEtiapine (SEROQUEL) 100 MG tablet Take 100 mg by mouth at bedtime.       . ramipril (ALTACE) 10 MG capsule Take 20 mg by mouth daily.      . saxagliptin HCl (ONGLYZA) 5 MG TABS tablet Take 5 mg by mouth daily.      . simvastatin (ZOCOR) 20 MG tablet Take 20 mg by mouth at bedtime.        . Tamsulosin HCl (FLOMAX) 0.4 MG CAPS Take 0.4 mg by mouth.        . telmisartan-hydrochlorothiazide (MICARDIS HCT) 80-12.5 MG per tablet Take 1 tablet by mouth daily.        . Vitamin D, Ergocalciferol, (DRISDOL) 50000 UNITS CAPS Take 50,000 Units by mouth.       No current facility-administered medications for this visit.    Allergies as of 11/07/2013  . (No Known Allergies)    Past Medical History  Diagnosis Date  . Hypertension   . Hyperlipidemia   . Chronic kidney disease  dialysis [facility], Sat  . Diabetes mellitus   . Cancer     Prostate cancer treated with radiation therapy  . Thyroid disease     hyperparathyroidism  . Anemia     Past Surgical History  Procedure Laterality Date  . 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 Av fistula placement  11/04/2010    left and left  . Av fistula placement  11/20/2011    Procedure: INSERTION OF ARTERIOVENOUS (AV) GORE-TEX GRAFT ARM;  Surgeon: Angelia Mould, MD;  Location: [facility] OR;  Service: Vascular;  Laterality: Right;  . Colonoscopy N/A 11/10/2012    [id number] proctitis/Pancolonic diverticulosis/(TUBULAR ADENOMA)5 mm polyp in the transverse segment/4 mm polyps in the base of the cecum and diminutive polyp in the descending segment. INADEQUATE BOWEL PREP    Family History  Problem Relation Age of Onset  . Heart disease Mother   . Diabetes Mother   . Hypertension Mother   . Stroke Mother   . Heart disease Father   . Diabetes Father   . Heart disease Sister   . Diabetes Brother   . Colon cancer Neg Hx     History   Social  History  . Marital Status: Married    Spouse Name: N/A    Number of Children: N/A  . Years of Education: N/A   Occupational History  . Not on file.   Social History Main Topics  . Smoking status: Current Every Day Smoker -- 1.00 packs/day for 50 years    Types: Cigarettes  . Smokeless tobacco: Not on file  . Alcohol Use: No  . Drug Use: No  . Sexual Activity: Not on file   Other Topics Concern  . Not on file   Social History Narrative  . No narrative on file      ROS:  General: Negative for anorexia, weight loss, fever, chills, fatigue, weakness. Eyes: Negative for vision changes.  ENT: Negative for hoarseness, difficulty swallowing , nasal congestion. CV: Negative for chest pain, angina, palpitations, dyspnea on exertion, peripheral edema.  Respiratory: Negative for dyspnea at rest, dyspnea on exertion, cough, sputum, wheezing.  GI: See history of present illness. GU:  Negative for dysuria, hematuria, urinary incontinence, urinary frequency, nocturnal urination.  MS: Negative for joint pain, low back pain.  Derm: Negative for rash or itching.  Neuro: Negative for weakness, abnormal sensation, seizure, frequent headaches, memory loss, confusion.  Psych: Negative for anxiety, depression, suicidal ideation, hallucinations.  Endo: Negative for unusual weight change.  Heme: Negative for bruising or bleeding. Allergy: Negative for rash or hives.    Physical Examination:  BP 135/54  Pulse 71  Temp(Src) 98.3 F (36.8 C) (Oral)  Ht 5\' 7"  (1.702 m)  Wt 171 lb (77.565 kg)  BMI 26.78 kg/m2   General: Well-nourished, well-developed in no acute distress.  Head: Normocephalic, atraumatic.   Eyes: Conjunctiva pink, no icterus. Mouth: Oropharyngeal mucosa moist and pink , no lesions erythema or exudate. Neck: Supple without thyromegaly, masses, or lymphadenopathy.  Lungs: Clear to auscultation bilaterally.  Heart: Regular rate and rhythm, no murmurs rubs or gallops.   Abdomen: Bowel sounds are normal, nontender, nondistended, no hepatosplenomegaly or masses, no abdominal bruits or    hernia , no rebound or guarding.   Rectal: Deferred Extremities: No lower extremity edema. Status post right BKA.  Neuro: Alert and oriented x 4 , grossly normal neurologically.  Skin: Warm and dry, no rash or jaundice.   Psych: Alert  and cooperative, normal mood and affect.

## 2013-11-07 NOTE — Patient Instructions (Signed)
1. Colonoscopy as scheduled. Please see written instructions. 2. Continue Linzess one daily for constipation.

## 2013-11-07 NOTE — Progress Notes (Signed)
cc'd to pcp 

## 2013-11-23 ENCOUNTER — Encounter (HOSPITAL_COMMUNITY): Payer: Self-pay | Admitting: Pharmacy Technician

## 2013-11-28 ENCOUNTER — Ambulatory Visit (HOSPITAL_COMMUNITY)
Admission: RE | Admit: 2013-11-28 | Discharge: 2013-11-28 | Disposition: A | Discharge status: Home Health | Payer: Medicare PPO | Source: Ambulatory Visit | Attending: Internal Medicine | Admitting: Internal Medicine

## 2013-11-28 ENCOUNTER — Encounter (HOSPITAL_COMMUNITY): Payer: Self-pay | Admitting: *Deleted

## 2013-11-28 ENCOUNTER — Encounter (HOSPITAL_COMMUNITY)
Admission: RE | Disposition: A | Discharge status: Home Health | Payer: Self-pay | Source: Ambulatory Visit | Attending: Internal Medicine

## 2013-11-28 DIAGNOSIS — Z860100 Personal history of colon polyps, unspecified: Secondary | ICD-10-CM | POA: Insufficient documentation

## 2013-11-28 DIAGNOSIS — K6289 Other specified diseases of anus and rectum: Secondary | ICD-10-CM | POA: Insufficient documentation

## 2013-11-28 DIAGNOSIS — I120 Hypertensive chronic kidney disease with stage 5 chronic kidney disease or end stage renal disease: Secondary | ICD-10-CM | POA: Insufficient documentation

## 2013-11-28 DIAGNOSIS — K5900 Constipation, unspecified: Secondary | ICD-10-CM

## 2013-11-28 DIAGNOSIS — D126 Benign neoplasm of colon, unspecified: Secondary | ICD-10-CM

## 2013-11-28 DIAGNOSIS — Z09 Encounter for follow-up examination after completed treatment for conditions other than malignant neoplasm: Secondary | ICD-10-CM | POA: Insufficient documentation

## 2013-11-28 DIAGNOSIS — K5730 Diverticulosis of large intestine without perforation or abscess without bleeding: Secondary | ICD-10-CM | POA: Insufficient documentation

## 2013-11-28 DIAGNOSIS — N186 End stage renal disease: Secondary | ICD-10-CM | POA: Insufficient documentation

## 2013-11-28 DIAGNOSIS — Z992 Dependence on renal dialysis: Secondary | ICD-10-CM | POA: Insufficient documentation

## 2013-11-28 HISTORY — PX: COLONOSCOPY: SHX5424

## 2013-11-28 LAB — GLUCOSE, CAPILLARY: Glucose-Capillary: 117 mg/dL — ABNORMAL HIGH (ref 70–99)

## 2013-11-28 SURGERY — COLONOSCOPY
Anesthesia: Moderate Sedation

## 2013-11-28 MED ORDER — ONDANSETRON HCL 4 MG/2ML IJ SOLN
INTRAMUSCULAR | Status: DC | PRN
  Administered 2013-11-28: 4 mg via INTRAVENOUS

## 2013-11-28 MED ORDER — STERILE WATER FOR IRRIGATION IR SOLN
Status: DC | PRN
  Administered 2013-11-28: 09:00:00

## 2013-11-28 MED ORDER — MIDAZOLAM HCL 5 MG/5ML IJ SOLN
INTRAMUSCULAR | Status: DC | PRN
  Administered 2013-11-28: 2 mg via INTRAVENOUS

## 2013-11-28 MED ORDER — MEPERIDINE HCL 100 MG/ML IJ SOLN
INTRAMUSCULAR | Status: AC
  Filled 2013-11-28: qty 2

## 2013-11-28 MED ORDER — MIDAZOLAM HCL 5 MG/5ML IJ SOLN
INTRAMUSCULAR | Status: AC
  Filled 2013-11-28: qty 10

## 2013-11-28 MED ORDER — SODIUM CHLORIDE 0.9 % IV SOLN
INTRAVENOUS | Status: DC
  Administered 2013-11-28: 1000 mL via INTRAVENOUS

## 2013-11-28 MED ORDER — MEPERIDINE HCL 100 MG/ML IJ SOLN
INTRAMUSCULAR | Status: DC | PRN
  Administered 2013-11-28: 50 mg via INTRAVENOUS

## 2013-11-28 MED ORDER — ONDANSETRON HCL 4 MG/2ML IJ SOLN
INTRAMUSCULAR | Status: AC
  Filled 2013-11-28: qty 2

## 2013-11-28 NOTE — H&P (View-Only) (Signed)
Primary Care Physician:  Donnie Coffin, MD  Primary Gastroenterologist:  Garfield Cornea, MD   Chief Complaint  Patient presents with  . Follow-up    HPI:  Gerald Davenport is a 73 y.o. male here for f/u to schedule repeat colonoscopy. Last seen in 04/2013 for constipation. Has h/o radiation proctitis and tubular adenomas last colonoscopy, needs early interval colonoscopy due to prior poor prep. Started on Linzess 142mcg daily at last OV. Seems to be doing well on this medication but he admits that he does not take it on his dialysis days. He is having bowel movements at least 5 days a week. Stools are soft. Denies any melena rectal bleeding. Denies abdominal pain, vomiting. Heartburn well-controlled. No dysphagia or odynophagia.    Current Outpatient Prescriptions  Medication Sig Dispense Refill  . amLODipine (NORVASC) 10 MG tablet Take 10 mg by mouth daily.       Marland Kitchen aspirin 81 MG tablet Take 81 mg by mouth daily.        Marland Kitchen buPROPion (WELLBUTRIN SR) 150 MG 12 hr tablet Take 150 mg by mouth 2 (two) times daily.       . calcitRIOL (ROCALTROL) 0.25 MCG capsule Take 0.25 mcg by mouth daily.       . calcium acetate (PHOSLO) 667 MG capsule Take 667 mg by mouth 3 (three) times daily.       . Calcium Acetate 667 MG TABS Take 667 mg by mouth 3 (three) times daily with meals.      . cloNIDine (CATAPRES) 0.2 MG tablet Take 0.2 mg by mouth 2 (two) times daily.       Marland Kitchen doxazosin (CARDURA) 8 MG tablet Take 8 mg by mouth at bedtime.       . furosemide (LASIX) 40 MG tablet Take 40 mg by mouth 2 (two) times daily.        Marland Kitchen glyBURIDE (DIABETA) 5 MG tablet Take 5 mg by mouth 2 (two) times daily with a meal.        . insulin detemir (LEVEMIR) 100 UNIT/ML injection Inject 12-16 Units into the skin daily. 12-16 units depending on blood sugar      . Linaclotide (LINZESS) 145 MCG CAPS capsule Take 1 capsule (145 mcg total) by mouth daily. Take 30 minutes before breakfast  30 capsule  11  . omeprazole (PRILOSEC) 20 MG  capsule TAKE 1 CAPSULE BY MOUTH DAILY. 30 MINUTES BEFORE BREAKFAST  30 capsule  11  . QUEtiapine (SEROQUEL) 100 MG tablet Take 100 mg by mouth at bedtime.       . ramipril (ALTACE) 10 MG capsule Take 20 mg by mouth daily.      . saxagliptin HCl (ONGLYZA) 5 MG TABS tablet Take 5 mg by mouth daily.      . simvastatin (ZOCOR) 20 MG tablet Take 20 mg by mouth at bedtime.        . Tamsulosin HCl (FLOMAX) 0.4 MG CAPS Take 0.4 mg by mouth.        . telmisartan-hydrochlorothiazide (MICARDIS HCT) 80-12.5 MG per tablet Take 1 tablet by mouth daily.        . Vitamin D, Ergocalciferol, (DRISDOL) 50000 UNITS CAPS Take 50,000 Units by mouth.       No current facility-administered medications for this visit.    Allergies as of 11/07/2013  . (No Known Allergies)    Past Medical History  Diagnosis Date  . Hypertension   . Hyperlipidemia   . Chronic kidney disease  dialysis [facility], Sat  . Diabetes mellitus   . Cancer     Prostate cancer treated with radiation therapy  . Thyroid disease     hyperparathyroidism  . Anemia     Past Surgical History  Procedure Laterality Date  . 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 Av fistula placement  11/04/2010    left and left  . Av fistula placement  11/20/2011    Procedure: INSERTION OF ARTERIOVENOUS (AV) GORE-TEX GRAFT ARM;  Surgeon: Angelia Mould, MD;  Location: [facility] OR;  Service: Vascular;  Laterality: Right;  . Colonoscopy N/A 11/10/2012    [id number] proctitis/Pancolonic diverticulosis/(TUBULAR ADENOMA)5 mm polyp in the transverse segment/4 mm polyps in the base of the cecum and diminutive polyp in the descending segment. INADEQUATE BOWEL PREP    Family History  Problem Relation Age of Onset  . Heart disease Mother   . Diabetes Mother   . Hypertension Mother   . Stroke Mother   . Heart disease Father   . Diabetes Father   . Heart disease Sister   . Diabetes Brother   . Colon cancer Neg Hx     History   Social  History  . Marital Status: Married    Spouse Name: N/A    Number of Children: N/A  . Years of Education: N/A   Occupational History  . Not on file.   Social History Main Topics  . Smoking status: Current Every Day Smoker -- 1.00 packs/day for 50 years    Types: Cigarettes  . Smokeless tobacco: Not on file  . Alcohol Use: No  . Drug Use: No  . Sexual Activity: Not on file   Other Topics Concern  . Not on file   Social History Narrative  . No narrative on file      ROS:  General: Negative for anorexia, weight loss, fever, chills, fatigue, weakness. Eyes: Negative for vision changes.  ENT: Negative for hoarseness, difficulty swallowing , nasal congestion. CV: Negative for chest pain, angina, palpitations, dyspnea on exertion, peripheral edema.  Respiratory: Negative for dyspnea at rest, dyspnea on exertion, cough, sputum, wheezing.  GI: See history of present illness. GU:  Negative for dysuria, hematuria, urinary incontinence, urinary frequency, nocturnal urination.  MS: Negative for joint pain, low back pain.  Derm: Negative for rash or itching.  Neuro: Negative for weakness, abnormal sensation, seizure, frequent headaches, memory loss, confusion.  Psych: Negative for anxiety, depression, suicidal ideation, hallucinations.  Endo: Negative for unusual weight change.  Heme: Negative for bruising or bleeding. Allergy: Negative for rash or hives.    Physical Examination:  BP 135/54  Pulse 71  Temp(Src) 98.3 F (36.8 C) (Oral)  Ht 5\' 7"  (1.702 m)  Wt 171 lb (77.565 kg)  BMI 26.78 kg/m2   General: Well-nourished, well-developed in no acute distress.  Head: Normocephalic, atraumatic.   Eyes: Conjunctiva pink, no icterus. Mouth: Oropharyngeal mucosa moist and pink , no lesions erythema or exudate. Neck: Supple without thyromegaly, masses, or lymphadenopathy.  Lungs: Clear to auscultation bilaterally.  Heart: Regular rate and rhythm, no murmurs rubs or gallops.   Abdomen: Bowel sounds are normal, nontender, nondistended, no hepatosplenomegaly or masses, no abdominal bruits or    hernia , no rebound or guarding.   Rectal: Deferred Extremities: No lower extremity edema. Status post right BKA.  Neuro: Alert and oriented x 4 , grossly normal neurologically.  Skin: Warm and dry, no rash or jaundice.   Psych: Alert  and cooperative, normal mood and affect.

## 2013-11-28 NOTE — Op Note (Signed)
[facility] [address] [place], [address]   COLONOSCOPY PROCEDURE REPORT  PATIENT: Gerald Davenport, Gerald Davenport  MR#:         [id number] BIRTHDATE: [birth year] , 31  yrs. old GENDER: Male ENDOSCOPIST: R.  Garfield Cornea, MD FACP FACG REFERRED BY:  Donnie Coffin, M.D. PROCEDURE DATE:  11/28/2013 PROCEDURE:     Colonoscopy with snare polypectomy  INDICATIONS: History of colonic adenoma  INFORMED CONSENT:  The risks, benefits, alternatives and imponderables including but not limited to bleeding, perforation as well as the possibility of a missed lesion have been reviewed.  The potential for biopsy, lesion removal, etc. have also been discussed.  Questions have been answered.  All parties agreeable. Please see the history and physical in the medical record for more information.  MEDICATIONS: Versed 2 mg IV and Demerol 50 mg IV. Zofran 4 mg IV  DESCRIPTION OF PROCEDURE:  After a digital rectal exam was performed, the EC-[id number] ([id number])  colonoscope was advanced from the anus through the rectum and colon to the area of the cecum, ileocecal valve and appendiceal orifice.  The cecum was deeply intubated.  These structures were well-seen and photographed for the record.  From the level of the cecum and ileocecal valve, the scope was slowly and cautiously withdrawn.  The mucosal surfaces were carefully surveyed utilizing scope tip deflection to facilitate fold flattening as needed.  The scope was pulled down into the rectum where a thorough examination including retroflexion was performed.    FINDINGS:  Adequate preparation. Neovascular changes of the rectal mucosa consistent prior history of radiation proctitis. Scattered pancolonic diverticula; (1) 5 mm polyps in the mid descending segment; (2) father polyps in the sigmoid segment; otherwise, the remainder of the colonic mucosa appeared normal.  THERAPEUTIC / DIAGNOSTIC MANEUVERS PERFORMED:  The above-mentioned polyps were  cold snare removed  COMPLICATIONS: none   CECAL WITHDRAWAL TIME:  23 minutes  IMPRESSION:  Radiation proctitis. Colonic diverticulosis. Colonic polyps-removed as described above  RECOMMENDATIONS: Followup on pathology   _______________________________ eSigned:  R. Garfield Cornea, MD FACP [facility] 11/28/2013 9:59 AM   CC:    PATIENT NAME:  Aleksey, [place]bern MR#: [id number]

## 2013-11-28 NOTE — Discharge Instructions (Addendum)
Colonoscopy Discharge Instructions  Read the instructions outlined below and refer to this sheet in the next few weeks. These discharge instructions provide you with general information on caring for yourself after you leave the hospital. Your doctor may also give you specific instructions. While your treatment has been planned according to the most current medical practices available, unavoidable complications occasionally occur. If you have any problems or questions after discharge, call Dr. Gala Romney at [phone]. ACTIVITY  You may resume your regular activity, but move at a slower pace for the next 24 hours.   Take frequent rest periods for the next 24 hours.   Walking will help get rid of the air and reduce the bloated feeling in your belly (abdomen).   No driving for 24 hours (because of the medicine (anesthesia) used during the test).    Do not sign any important legal documents or operate any machinery for 24 hours (because of the anesthesia used during the test).  NUTRITION  Drink plenty of fluids.   You may resume your normal diet as instructed by your doctor.   Begin with a light meal and progress to your normal diet. Heavy or fried foods are harder to digest and may make you feel sick to your stomach (nauseated).   Avoid alcoholic beverages for 24 hours or as instructed.  MEDICATIONS  You may resume your normal medications unless your doctor tells you otherwise.  WHAT YOU CAN EXPECT TODAY  Some feelings of bloating in the abdomen.   Passage of more gas than usual.   Spotting of blood in your stool or on the toilet paper.  IF YOU HAD POLYPS REMOVED DURING THE COLONOSCOPY:  No aspirin products for 7 days or as instructed.   No alcohol for 7 days or as instructed.   Eat a soft diet for the next 24 hours.  FINDING OUT THE RESULTS OF YOUR TEST Not all test results are available during your visit. If your test results are not back during the visit, make an appointment  with your caregiver to find out the results. Do not assume everything is normal if you have not heard from your caregiver or the medical facility. It is important for you to follow up on all of your test results.  SEEK IMMEDIATE MEDICAL ATTENTION IF:  You have more than a spotting of blood in your stool.   Your belly is swollen (abdominal distention).   You are nauseated or vomiting.   You have a temperature over 101.   You have abdominal pain or discomfort that is severe or gets worse throughout the day.   Diiverticulosis and polyp information provided  Further recommendations to follow pending review of pathology report  Colon Polyps Polyps are lumps of extra tissue growing inside the body. Polyps can grow in the large intestine (colon). Most colon polyps are noncancerous (benign). However, some colon polyps can become cancerous over time. Polyps that are larger than a pea may be harmful. To be safe, caregivers remove and test all polyps. CAUSES  Polyps form when mutations in the genes cause your cells to grow and divide even though no more tissue is needed. RISK FACTORS There are a number of risk factors that can increase your chances of getting colon polyps. They include: Being older than 50 years. Family history of colon polyps or colon cancer. Long-term colon diseases, such as colitis or Crohn disease. Being overweight. Smoking. Being inactive. Drinking too much alcohol. SYMPTOMS  Most small polyps do not  cause symptoms. If symptoms are present, they may include: Blood in the stool. The stool may look dark red or black. Constipation or diarrhea that lasts longer than 1 week. DIAGNOSIS People often do not know they have polyps until their caregiver finds them during a regular checkup. Your caregiver can use 4 tests to check for polyps: Digital rectal exam. The caregiver wears gloves and feels inside the rectum. This test would find polyps only in the rectum. Barium enema.  The caregiver puts a liquid called barium into your rectum before taking X-rays of your colon. Barium makes your colon look white. Polyps are dark, so they are easy to see in the X-ray pictures. Sigmoidoscopy. A thin, flexible tube (sigmoidoscope) is placed into your rectum. The sigmoidoscope has a light and tiny camera in it. The caregiver uses the sigmoidoscope to look at the last third of your colon. Colonoscopy. This test is like sigmoidoscopy, but the caregiver looks at the entire colon. This is the most common method for finding and removing polyps. TREATMENT  Any polyps will be removed during a sigmoidoscopy or colonoscopy. The polyps are then tested for cancer. PREVENTION  To help lower your risk of getting more colon polyps: Eat plenty of fruits and vegetables. Avoid eating fatty foods. Do not smoke. Avoid drinking alcohol. Exercise every day. Lose weight if recommended by your caregiver. Eat plenty of calcium and folate. Foods that are rich in calcium include milk, cheese, and broccoli. Foods that are rich in folate include chickpeas, kidney beans, and spinach. HOME CARE INSTRUCTIONS Keep all follow-up appointments as directed by your caregiver. You may need periodic exams to check for polyps. SEEK MEDICAL CARE IF: You notice bleeding during a bowel movement. Document Released: 06/11/2004 Document Revised: 12/08/2011 Document Reviewed: 11/25/2011 [facility] Patient Information 2014 [place]. Diverticulosis Diverticulosis is a common condition that develops when small pouches (diverticula) form in the wall of the colon. The risk of diverticulosis increases with age. It happens more often in people who eat a low-fiber diet. Most individuals with diverticulosis have no symptoms. Those individuals with symptoms usually experience abdominal pain, constipation, or loose stools (diarrhea). HOME CARE INSTRUCTIONS   Increase the amount of fiber in your diet as directed by your caregiver  or dietician. This may reduce symptoms of diverticulosis.  Your caregiver may recommend taking a dietary fiber supplement.  Drink at least 6 to 8 glasses of water each day to prevent constipation.  Try not to strain when you have a bowel movement.  Your caregiver may recommend avoiding nuts and seeds to prevent complications, although this is still an uncertain benefit.  Only take over-the-counter or prescription medicines for pain, discomfort, or fever as directed by your caregiver. FOODS WITH HIGH FIBER CONTENT INCLUDE:  Fruits. Apple, peach, pear, tangerine, raisins, prunes.  Vegetables. Brussels sprouts, asparagus, broccoli, cabbage, carrot, cauliflower, romaine lettuce, spinach, summer squash, tomato, winter squash, zucchini.  Starchy Vegetables. Baked beans, kidney beans, lima beans, split peas, lentils, potatoes (with skin).  Grains. Whole wheat bread, brown rice, bran flake cereal, plain oatmeal, white rice, shredded wheat, bran muffins. SEEK IMMEDIATE MEDICAL CARE IF:   You develop increasing pain or severe bloating.  You have an oral temperature above 102 F (38.9 C), not controlled by medicine.  You develop vomiting or bowel movements that are bloody or black. Document Released: 06/12/2004 Document Revised: 12/08/2011 Document Reviewed: 02/13/2010 [facility] Patient Information 2014 [place].

## 2013-11-28 NOTE — Interval H&P Note (Signed)
History and Physical Interval Note:  11/28/2013 9:22 AM  Gerald Davenport  has presented today for surgery, with the diagnosis of HISTORY OF COLONIC ADENOMA  AND CONSTIPATION  The various methods of treatment have been discussed with the patient and family. After consideration of risks, benefits and other options for treatment, the patient has consented to  Procedure(s) with comments: COLONOSCOPY (N/A) - 945 as a surgical intervention .  The patient's history has been reviewed, patient examined, no change in status, stable for surgery.  I have reviewed the patient's chart and labs.  Questions were answered to the patient's satisfaction.     No change; The risks, benefits, limitations, alternatives and imponderables have been reviewed with the patient. Questions have been answered. All parties are agreeable.   Manus Rudd

## 2013-11-30 ENCOUNTER — Encounter: Payer: Self-pay | Admitting: Internal Medicine

## 2013-12-02 ENCOUNTER — Encounter (HOSPITAL_COMMUNITY): Payer: Self-pay | Admitting: Internal Medicine

## 2014-01-12 ENCOUNTER — Emergency Department (HOSPITAL_COMMUNITY)
Admission: EM | Admit: 2014-01-12 | Discharge: 2014-01-12 | Disposition: A | Discharge status: Home / Self Care | Payer: Medicare PPO | Attending: Emergency Medicine | Admitting: Emergency Medicine

## 2014-01-12 ENCOUNTER — Encounter (HOSPITAL_COMMUNITY): Payer: Self-pay | Admitting: Emergency Medicine

## 2014-01-12 ENCOUNTER — Emergency Department (HOSPITAL_COMMUNITY): Payer: Medicare PPO

## 2014-01-12 DIAGNOSIS — E785 Hyperlipidemia, unspecified: Secondary | ICD-10-CM | POA: Insufficient documentation

## 2014-01-12 DIAGNOSIS — F17200 Nicotine dependence, unspecified, uncomplicated: Secondary | ICD-10-CM | POA: Insufficient documentation

## 2014-01-12 DIAGNOSIS — Z794 Long term (current) use of insulin: Secondary | ICD-10-CM | POA: Insufficient documentation

## 2014-01-12 DIAGNOSIS — Z992 Dependence on renal dialysis: Secondary | ICD-10-CM | POA: Insufficient documentation

## 2014-01-12 DIAGNOSIS — Z8546 Personal history of malignant neoplasm of prostate: Secondary | ICD-10-CM | POA: Insufficient documentation

## 2014-01-12 DIAGNOSIS — R042 Hemoptysis: Secondary | ICD-10-CM | POA: Insufficient documentation

## 2014-01-12 DIAGNOSIS — Z7982 Long term (current) use of aspirin: Secondary | ICD-10-CM | POA: Insufficient documentation

## 2014-01-12 DIAGNOSIS — N186 End stage renal disease: Secondary | ICD-10-CM | POA: Insufficient documentation

## 2014-01-12 DIAGNOSIS — Z79899 Other long term (current) drug therapy: Secondary | ICD-10-CM | POA: Insufficient documentation

## 2014-01-12 DIAGNOSIS — E119 Type 2 diabetes mellitus without complications: Secondary | ICD-10-CM | POA: Insufficient documentation

## 2014-01-12 DIAGNOSIS — I120 Hypertensive chronic kidney disease with stage 5 chronic kidney disease or end stage renal disease: Secondary | ICD-10-CM | POA: Insufficient documentation

## 2014-01-12 DIAGNOSIS — Z862 Personal history of diseases of the blood and blood-forming organs and certain disorders involving the immune mechanism: Secondary | ICD-10-CM | POA: Insufficient documentation

## 2014-01-12 LAB — CBC WITH DIFFERENTIAL/PLATELET
Basophils Absolute: 0.1 10*3/uL (ref 0.0–0.1)
Basophils Relative: 0 % (ref 0–1)
EOS ABS: 0.3 10*3/uL (ref 0.0–0.7)
EOS PCT: 2 % (ref 0–5)
HCT: 38.3 % — ABNORMAL LOW (ref 39.0–52.0)
Hemoglobin: 12.6 g/dL — ABNORMAL LOW (ref 13.0–17.0)
Lymphocytes Relative: 8 % — ABNORMAL LOW (ref 12–46)
Lymphs Abs: 1 10*3/uL (ref 0.7–4.0)
MCH: 35.7 pg — AB (ref 26.0–34.0)
MCHC: 32.9 g/dL (ref 30.0–36.0)
MCV: 108.5 fL — AB (ref 78.0–100.0)
Monocytes Absolute: 1.2 10*3/uL — ABNORMAL HIGH (ref 0.1–1.0)
Monocytes Relative: 9 % (ref 3–12)
NEUTROS PCT: 81 % — AB (ref 43–77)
Neutro Abs: 9.9 10*3/uL — ABNORMAL HIGH (ref 1.7–7.7)
Platelets: 224 10*3/uL (ref 150–400)
RBC: 3.53 MIL/uL — ABNORMAL LOW (ref 4.22–5.81)
RDW: 15.6 % — AB (ref 11.5–15.5)
WBC: 12.3 10*3/uL — ABNORMAL HIGH (ref 4.0–10.5)

## 2014-01-12 LAB — COMPREHENSIVE METABOLIC PANEL
ALK PHOS: 83 U/L (ref 39–117)
ALT: 13 U/L (ref 0–53)
AST: 18 U/L (ref 0–37)
Albumin: 3.9 g/dL (ref 3.5–5.2)
BUN: 11 mg/dL (ref 6–23)
CO2: 35 meq/L — AB (ref 19–32)
CREATININE: 4.19 mg/dL — AB (ref 0.50–1.35)
Calcium: 9 mg/dL (ref 8.4–10.5)
Chloride: 91 mEq/L — ABNORMAL LOW (ref 96–112)
GFR calc Af Amer: 15 mL/min — ABNORMAL LOW (ref >90)
GFR, EST NON AFRICAN AMERICAN: 13 mL/min — AB (ref >90)
Glucose, Bld: 147 mg/dL — ABNORMAL HIGH (ref 70–99)
Potassium: 4.2 mEq/L (ref 3.7–5.3)
SODIUM: 137 meq/L (ref 137–147)
Total Bilirubin: 0.4 mg/dL (ref 0.3–1.2)
Total Protein: 8.8 g/dL — ABNORMAL HIGH (ref 6.0–8.3)

## 2014-01-12 MED ORDER — PREDNISONE 20 MG PO TABS: 60.0000 mg | ORAL_TABLET | Freq: Every day | ORAL | Status: AC

## 2014-01-12 MED ORDER — PREDNISONE 50 MG PO TABS
60.0000 mg | ORAL_TABLET | ORAL | Status: AC
  Administered 2014-01-12: 60 mg via ORAL
  Filled 2014-01-12 (×2): qty 1

## 2014-01-12 MED ORDER — ALBUTEROL SULFATE HFA 108 (90 BASE) MCG/ACT IN AERS
2.0000 | INHALATION_SPRAY | Freq: Four times a day (QID) | RESPIRATORY_TRACT | Status: DC
  Administered 2014-01-12: 2 via RESPIRATORY_TRACT
  Filled 2014-01-12: qty 6.7

## 2014-01-12 NOTE — ED Notes (Signed)
Pt c/o coughing up bloody sputum x 2-3 weeks.

## 2014-01-12 NOTE — ED Notes (Signed)
Pt states cough and congestion x 2 weeks.

## 2014-01-12 NOTE — ED Notes (Signed)
RT paged for inhaler

## 2014-01-12 NOTE — ED Provider Notes (Signed)
CSN: [id number]     Arrival date & time 01/12/14  1119 History  This chart was scribed for Carmin Muskrat, MD by Roxan Diesel, ED scribe.  This patient was seen in room APA15/[facility] and the patient's care was started at 12:04 PM.   Chief Complaint  Patient presents with  . Hemoptysis    The history is provided by the patient. No language interpreter was used.    HPI Comments: Gerald Davenport is a 73 y.o. male who presents to the Emergency Department complaining of several weeks of persistent hemoptysis.  Pt states he has been coughing up bloody sputum for 2-3 weeks.  He has otherwise been well and denies syncope, vomiting, fever, CP, or any other associated symptoms.  He informed his dialysis specialist today about his hemoptysis and was advised to come to the ED after receiving dialysis.  Pt is taking Linzess.  He is a current smoker.   Past Medical History  Diagnosis Date  . Hypertension   . Hyperlipidemia   . Chronic kidney disease     dialysis Tues, Thur, Sat  . Diabetes mellitus   . Cancer     Prostate cancer treated with radiation therapy  . Thyroid disease     hyperparathyroidism  . Anemia     Past Surgical History  Procedure Laterality Date  . 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 Av fistula placement  11/04/2010    left and left  . Av fistula placement  11/20/2011    Procedure: INSERTION OF ARTERIOVENOUS (AV) GORE-TEX GRAFT ARM;  Surgeon: Angelia Mould, MD;  Location: [facility] OR;  Service: Vascular;  Laterality: Right;  . Colonoscopy N/A 11/10/2012    HYI:FOYDXAJOI-NOMVEHM proctitis/Pancolonic diverticulosis/(TUBULAR ADENOMA)5 mm polyp in the transverse segment/4 mm polyps in the base of the cecum and diminutive polyp in the descending segment. INADEQUATE BOWEL PREP  . Colonoscopy N/A 11/28/2013    Procedure: COLONOSCOPY;  Surgeon: Daneil Dolin, MD;  Location: [facility] ENDO SUITE;  Service: Endoscopy;  Laterality: N/A;  945    Family History  Problem Relation Age  of Onset  . Heart disease Mother   . Diabetes Mother   . Hypertension Mother   . Stroke Mother   . Heart disease Father   . Diabetes Father   . Heart disease Sister   . Diabetes Brother   . Colon cancer Neg Hx     History  Substance Use Topics  . Smoking status: Current Every Day Smoker -- 1.00 packs/day for 50 years    Types: Cigarettes  . Smokeless tobacco: Not on file  . Alcohol Use: No     Review of Systems  Constitutional:       Per HPI, otherwise negative  HENT:       Per HPI, otherwise negative  Respiratory:       Per HPI, otherwise negative  Cardiovascular:       Per HPI, otherwise negative  Gastrointestinal: Negative for vomiting.  Endocrine:       Negative aside from HPI  Genitourinary:       Neg aside from HPI   Musculoskeletal:       Per HPI, otherwise negative  Skin: Negative.   Neurological: Negative for syncope.      Allergies  Review of patient's allergies indicates no known allergies.  Home Medications   Prior to Admission medications   Medication Sig Start Date End Date Taking? Authorizing Provider  amLODipine (NORVASC) 10 MG  tablet Take 10 mg by mouth daily.  06/21/12   Historical Provider, MD  aspirin 81 MG tablet Take 81 mg by mouth daily.      Historical Provider, MD  buPROPion (WELLBUTRIN SR) 150 MG 12 hr tablet Take 150 mg by mouth 2 (two) times daily.  08/21/12   Historical Provider, MD  calcitRIOL (ROCALTROL) 0.25 MCG capsule Take 0.25 mcg by mouth daily.     Historical Provider, MD  calcium acetate (PHOSLO) 667 MG capsule Take 667 mg by mouth 3 (three) times daily.  10/26/12   Historical Provider, MD  Calcium Acetate 667 MG TABS Take 667 mg by mouth 3 (three) times daily with meals.    Historical Provider, MD  cloNIDine (CATAPRES) 0.2 MG tablet Take 0.2 mg by mouth 2 (two) times daily.     Historical Provider, MD  doxazosin (CARDURA) 8 MG tablet Take 8 mg by mouth at bedtime.     Historical Provider, MD  furosemide (LASIX) 40 MG  tablet Take 40 mg by mouth 2 (two) times daily.      Historical Provider, MD  glyBURIDE (DIABETA) 5 MG tablet Take 5 mg by mouth 2 (two) times daily with a meal.      Historical Provider, MD  insulin detemir (LEVEMIR) 100 UNIT/ML injection Inject 12-16 Units into the skin daily. 12-16 units depending on blood sugar    Historical Provider, MD  Linaclotide (LINZESS) 145 MCG CAPS capsule Take 1 capsule (145 mcg total) by mouth daily. Take 30 minutes before breakfast 08/10/13   Orvil Feil, NP  omeprazole (PRILOSEC) 20 MG capsule Take 20 mg by mouth daily.    Historical Provider, MD  polyethylene glycol-electrolytes (TRILYTE) 420 G solution Take 4,000 mLs by mouth as directed. 11/07/13   Daneil Dolin, MD  saxagliptin HCl (ONGLYZA) 5 MG TABS tablet Take 5 mg by mouth daily.    Historical Provider, MD  simvastatin (ZOCOR) 20 MG tablet Take 20 mg by mouth at bedtime.      Historical Provider, MD  Vitamin D, Ergocalciferol, (DRISDOL) 50000 UNITS CAPS Take 50,000 Units by mouth every 7 (seven) days. mondays    Historical Provider, MD   BP 176/85  Pulse 107  Temp(Src) 99.1 F (37.3 C)  Resp 20  Ht 5\' 9"  (1.753 m)  Wt 172 lb (78.019 kg)  BMI 25.39 kg/m2  SpO2 100%  Physical Exam  Nursing note and vitals reviewed. Constitutional: He is oriented to person, place, and time. He appears well-developed. No distress.  HENT:  Head: Normocephalic and atraumatic.  Eyes: Conjunctivae and EOM are normal.  Cardiovascular: Normal rate, regular rhythm and normal heart sounds.   No murmur heard. Pulmonary/Chest: Effort normal and breath sounds normal. No stridor. No respiratory distress. He has no wheezes. He has no rales.  Abdominal: Soft. He exhibits no distension. There is no tenderness.  Musculoskeletal: He exhibits no edema.  Neurological: He is alert and oriented to person, place, and time.  Skin: Skin is warm and dry.  Psychiatric: He has a normal mood and affect.    ED Course  Procedures (including  critical care time)  DIAGNOSTIC STUDIES: Oxygen Saturation is 100% on room air, normal by my interpretation.    COORDINATION OF CARE: 12:07 PM-Discussed treatment plan which includes bloodwork and CXR with pt at bedside and pt agreed to plan.   Update: On exam the patient is sitting upright, appears comfortable.  I had a lengthy conversation with him and his wife about the  presumed diagnosis of bronchitis.  They both voiced understanding of return precautions.  Labs Review Labs Reviewed  COMPREHENSIVE METABOLIC PANEL - Abnormal; Notable for the following:    Chloride 91 (*)    CO2 35 (*)    Glucose, Bld 147 (*)    Creatinine, Ser 4.19 (*)    Total Protein 8.8 (*)    GFR calc non Af Amer 13 (*)    GFR calc Af Amer 15 (*)    All other components within normal limits  CBC WITH DIFFERENTIAL - Abnormal; Notable for the following:    WBC 12.3 (*)    RBC 3.53 (*)    Hemoglobin 12.6 (*)    HCT 38.3 (*)    MCV 108.5 (*)    MCH 35.7 (*)    RDW 15.6 (*)    Neutrophils Relative % 81 (*)    Neutro Abs 9.9 (*)    Lymphocytes Relative 8 (*)    Monocytes Absolute 1.2 (*)    All other components within normal limits    Imaging Review Dg Chest 2 View  01/12/2014   CLINICAL DATA:  Hemoptysis, cough  EXAM: CHEST  2 VIEW  COMPARISON:  March 22, 2013  FINDINGS: The heart size and mediastinal contours are within normal limits. Both lungs are clear. The visualized skeletal structures are unremarkable.  IMPRESSION: No active cardiopulmonary disease.   Electronically Signed   By: Abelardo Diesel M.D.   On: 01/12/2014 12:25    MDM   Final diagnoses:  Hemoptysis    I personally performed the services described in this documentation, which was scribed in my presence. The recorded information has been reviewed and is accurate.   Patient presents with concerns of ongoing hemoptysis.  Patient is otherwise without complaints, has completed dialysis today and is hemodynamically stable, awake, alert, in  no distress on my exam. Patient's presentation is most consistent with bronchitis.  The patient during the course of steroids, albuterol.  Exposer return precautions, follow up instructions provided.  Erosion of pulmonary vasculature seemingly is less likely, and there is no indication for emergent imaging given the patient's hemodynamically stability.  Patient has a primary care physician with whom he will followup.  Carmin Muskrat, MD 01/12/14 1515

## 2014-01-12 NOTE — Discharge Instructions (Signed)
As discussed, your condition is likely due to inflammation of the airways - possibly bronchitis.  Please take all medication as directed, and do not hesitate to return here if you develop new, or concerning changes in your condition.   Hemoptysis Hemoptysis, which means coughing up blood, can be a sign of a minor problem or a serious medical condition. The blood that is coughed up may come from the lungs and airways. Coughed-up blood can also come from bleeding that occurs outside the lungs and airways. Blood can drain into the windpipe during a severe nosebleed or when blood is vomited from the stomach. Because hemoptysis can be a sign of something serious, a medical evaluation is required. For some people with hemoptysis, no definite cause is ever identified. CAUSES  The most common cause of hemoptysis is bronchitis. Some other common causes include:   A ruptured blood vessel caused by coughing or an infection.   A medical condition that causes damage to the large air passageways (bronchiectasis).   A blood clot in the lungs (pulmonary embolism).   Pneumonia.   Tuberculosis.   Breathing in a small foreign object.   Cancer. For some people with hemoptysis, no definite cause is ever identified.  HOME CARE INSTRUCTIONS  Only take over-the-counter or prescription medicines as directed by your caregiver. Do not use cough suppressants unless your caregiver approves.  If your caregiver prescribes antibiotic medicines, take them as directed. Finish them even if you start to feel better.  Do not smoke. Also avoid secondhand smoke.  Follow up with your caregiver as directed. SEEK IMMEDIATE MEDICAL CARE IF:   You cough up bloody mucus for longer than a week.  You have a blood-producing cough that is severe or getting worse.  You have a blood-producing cough thatcomes and goes over time.  You develop problems with your breathing.   You vomit blood.  You develop bloody or  black-colored stools.  You have chest pain.   You develop night sweats.  You feel faint or pass out.   You have a fever or persistent symptoms for more than 2 3 days.  You have a fever and your symptoms suddenly get worse. MAKE SURE YOU:  Understand these instructions.  Will watch your condition.  Will get help right away if you are not doing well or get worse. Document Released: 11/24/2001 Document Revised: 09/01/2012 Document Reviewed: 07/02/2012 [facility] Patient Information 2014 [place], [place].

## 2014-03-15 ENCOUNTER — Encounter: Payer: Self-pay | Admitting: General Surgery

## 2014-03-15 DIAGNOSIS — E782 Mixed hyperlipidemia: Secondary | ICD-10-CM | POA: Insufficient documentation

## 2014-03-15 DIAGNOSIS — I10 Essential (primary) hypertension: Secondary | ICD-10-CM

## 2014-03-24 ENCOUNTER — Ambulatory Visit
Admission: RE | Admit: 2014-03-24 | Discharge: 2014-03-24 | Disposition: A | Discharge status: Home / Self Care | Payer: Medicare PPO | Source: Ambulatory Visit | Attending: Cardiology | Admitting: Cardiology

## 2014-03-24 ENCOUNTER — Encounter: Payer: Self-pay | Admitting: Cardiology

## 2014-03-24 ENCOUNTER — Ambulatory Visit (INDEPENDENT_AMBULATORY_CARE_PROVIDER_SITE_OTHER): Payer: Medicare PPO | Admitting: Cardiology

## 2014-03-24 VITALS — BP 136/67 | HR 86 | Wt 162.8 lb

## 2014-03-24 DIAGNOSIS — R0602 Shortness of breath: Secondary | ICD-10-CM

## 2014-03-24 DIAGNOSIS — R9431 Abnormal electrocardiogram [ECG] [EKG]: Secondary | ICD-10-CM | POA: Insufficient documentation

## 2014-03-24 DIAGNOSIS — R0600 Dyspnea, unspecified: Secondary | ICD-10-CM | POA: Insufficient documentation

## 2014-03-24 DIAGNOSIS — I10 Essential (primary) hypertension: Secondary | ICD-10-CM

## 2014-03-24 LAB — BRAIN NATRIURETIC PEPTIDE: Pro B Natriuretic peptide (BNP): 23 pg/mL (ref 0.0–100.0)

## 2014-03-24 NOTE — Progress Notes (Signed)
[place], [place],   [id number] Phone: [phone] Fax:  [phone]  Date:  03/24/2014   ID:  TREYSHAWN MULDREW, DOB 1940/12/06, MRN [id number]  PCP:  Donnie Coffin, MD  Cardiologist:  Fransico Him, MD     History of Present Illness: Gerald Davenport is a 73 y.o. male with a history of HTN, dyslipidemia, DM and CKD who presents today for evaluation of SOB.  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  Nuclear stress test showed no ischemia. He says that he has had SOB for about 6 months.  He says that the SOB occurs when he is on HD but no other time.  He denies any chest pain or pressure, palpitations, dizziness or syncope.  He denies any LE edema.  He denies any fever, chills or cough.  Wt Readings from Last 3 Encounters:  03/24/14 162 lb 12.8 oz (73.846 kg)  01/12/14 172 lb (78.019 kg)  11/28/13 171 lb (77.565 kg)     Past Medical History  Diagnosis Date  . Hypertension   . Hyperlipidemia   . Chronic kidney disease     dialysis Tues, Thur, Sat  . Diabetes mellitus   . Cancer     Prostate cancer treated with radiation therapy  . Thyroid disease     hyperparathyroidism  . Anemia   . Positive PPD   . Clot 05/2006    Arterial clot right leg   . Hemodialysis patient 6/13  . Diverticulosis     Current Outpatient Prescriptions  Medication Sig Dispense Refill  . aspirin 81 MG tablet Take 81-162 mg by mouth 2 (two) times daily.       Marland Kitchen buPROPion (WELLBUTRIN SR) 150 MG 12 hr tablet Take 150 mg by mouth daily.       . calcium acetate (PHOSLO) 667 MG capsule Take 2,001 mg by mouth 3 (three) times daily.       . cloNIDine (CATAPRES) 0.2 MG tablet Take 0.2 mg by mouth 3 (three) times daily.       Marland Kitchen doxazosin (CARDURA) 8 MG tablet Take 8 mg by mouth at bedtime.       . furosemide (LASIX) 40 MG tablet Take 40 mg by mouth 2 (two) times daily.        . insulin detemir  (LEVEMIR) 100 UNIT/ML injection Inject 8-20 Units into the skin at bedtime.       . Linaclotide (LINZESS) 145 MCG CAPS capsule Take 1 capsule (145 mcg total) by mouth daily. Take 30 minutes before breakfast  30 capsule  11  . saxagliptin HCl (ONGLYZA) 5 MG TABS tablet Take 5 mg by mouth daily.      . simvastatin (ZOCOR) 20 MG tablet Take 20 mg by mouth at bedtime.        . Vitamin D, Ergocalciferol, (DRISDOL) 50000 UNITS CAPS Take 50,000 Units by mouth every 7 (seven) days. mondays       No current facility-administered medications for this visit.    Allergies:   No Known Allergies  Social History:  The patient  reports that he has been smoking Cigarettes.  He has a 50 pack-year smoking history. He does not have any smokeless tobacco history on file. He reports that he does not drink alcohol or use illicit drugs.   Family History:  The patient's family history includes Diabetes in  his brother, father, and mother; Heart disease in his father, mother, and sister; Hypertension in his mother; Stroke in his mother. There is no history of Colon cancer.   ROS:  Please see the history of present illness.      All other systems reviewed and negative.   PHYSICAL EXAM: VS:  BP 136/67  Pulse 86  Wt 162 lb 12.8 oz (73.846 kg) Well nourished, well developed, in no acute distress HEENT: normal Neck: no JVD Cardiac:  normal S1, S2; RRR; no murmur Lungs:  clear to auscultation bilaterally, no wheezing, rhonchi or rales Abd: soft, nontender, no hepatomegaly Ext: no edema Skin: warm and dry Neuro:  CNs 2-12 intact, no focal abnormalities noted  EKG:     NSR with ST/T wave abnormality c/w inferolateral ischemia - more prominent from EKG 2013  ASSESSMENT AND PLAN:  1. SOB that seems to only occur during HD and not at any other time.  He has ST/T wave changes of ischemia that are more prominent on today's EKG compared to 2013.  On lung exam he has crackles that do not clear with cough.  I will check a  BNP today and a chest xray.  He will be set up for 2D echo to assess LVF and Lexiscan myoview to rule out ischemia.   2. Abnormal EKG with more prominent ST changes on EKG - check Lexiscan myoview to rule out ischemia 3. HTN  - well controlled  Followup with me PRN pending results of studies  Signed, Fransico Him, MD 03/24/2014 8:31 AM

## 2014-03-24 NOTE — Patient Instructions (Signed)
Your physician recommends that you continue on your current medications as directed. Please refer to the Current Medication list given to you today.  A chest x-ray takes a picture of the organs and structures inside the chest, including the heart, lungs, and blood vessels. This test can show several things, including, whether the heart is enlarges; whether fluid is building up in the lungs; and whether pacemaker / defibrillator leads are still in place. ( Please go over to [facility] today at [address], [place] [phone]. They are a walk in facility.)  Your physician has requested that you have a lexiscan myoview. For further information please visit [email or web address]. Please follow instruction sheet, as given.  Your physician has requested that you have an echocardiogram. Echocardiography is a painless test that uses sound waves to create images of your heart. It provides your doctor with information about the size and shape of your heart and how well your heart's chambers and valves are working. This procedure takes approximately one hour. There are no restrictions for this procedure.  Your physician recommends that you go to the lab today for a BNP  Your physician recommends that you schedule a follow-up appointment As Needed

## 2014-04-07 ENCOUNTER — Telehealth (HOSPITAL_COMMUNITY): Payer: Self-pay

## 2014-04-07 NOTE — Telephone Encounter (Signed)
Encounter complete. 

## 2014-04-11 ENCOUNTER — Telehealth (HOSPITAL_COMMUNITY): Payer: Self-pay

## 2014-04-11 NOTE — Telephone Encounter (Signed)
Encounter complete. 

## 2014-04-12 ENCOUNTER — Ambulatory Visit (HOSPITAL_COMMUNITY)
Admission: RE | Admit: 2014-04-12 | Discharge: 2014-04-12 | Disposition: A | Discharge status: Home / Self Care | Payer: Medicare PPO | Source: Ambulatory Visit | Attending: Cardiology | Admitting: Cardiology

## 2014-04-12 ENCOUNTER — Other Ambulatory Visit (HOSPITAL_COMMUNITY): Payer: Medicare PPO

## 2014-04-12 ENCOUNTER — Ambulatory Visit (HOSPITAL_BASED_OUTPATIENT_CLINIC_OR_DEPARTMENT_OTHER)
Admission: RE | Admit: 2014-04-12 | Discharge: 2014-04-12 | Disposition: A | Discharge status: Home / Self Care | Payer: Medicare PPO | Source: Ambulatory Visit | Attending: Internal Medicine | Admitting: Internal Medicine

## 2014-04-12 DIAGNOSIS — I359 Nonrheumatic aortic valve disorder, unspecified: Secondary | ICD-10-CM

## 2014-04-12 DIAGNOSIS — R0602 Shortness of breath: Secondary | ICD-10-CM

## 2014-04-12 MED ORDER — TECHNETIUM TC 99M SESTAMIBI GENERIC - CARDIOLITE
31.0000 | Freq: Once | INTRAVENOUS | Status: AC | PRN
  Administered 2014-04-12: 31 via INTRAVENOUS

## 2014-04-12 MED ORDER — AMINOPHYLLINE 25 MG/ML IV SOLN
75.0000 mg | Freq: Once | INTRAVENOUS | Status: AC
  Administered 2014-04-12: 75 mg via INTRAVENOUS

## 2014-04-12 MED ORDER — REGADENOSON 0.4 MG/5ML IV SOLN
0.4000 mg | Freq: Once | INTRAVENOUS | Status: AC
  Administered 2014-04-12: 0.4 mg via INTRAVENOUS

## 2014-04-12 MED ORDER — TECHNETIUM TC 99M SESTAMIBI GENERIC - CARDIOLITE
10.9000 | Freq: Once | INTRAVENOUS | Status: AC | PRN
  Administered 2014-04-12: 10.9 via INTRAVENOUS

## 2014-04-12 NOTE — Progress Notes (Signed)
2D Echo Performed 04/12/2014    Marygrace Drought, RCS

## 2014-04-12 NOTE — Procedures (Addendum)
[facility] [address] [place] [address] [phone]  Cardiology Nuclear Med Study  Gerald Davenport is a 73 y.o. male     MRN : [id number]     DOB: 03-Mar-1941  Procedure Date: 04/12/2014  Nuclear Med Background Indication for Stress Test:  Evaluation for Ischemia and Abnormal EKG History:  No prior cardiac disease reported;DVT;Last NUC MPI 03/12/2012-nonischemic;EF=54%ECHO on 03/12/2012 Cardiac Risk Factors: Family History - CAD, Hypertension, IDDM Type 2, Lipids and Smoker  Symptoms:  Chest Pain, DOE, Fatigue and SOB   Nuclear Pre-Procedure Caffeine/Decaff Intake:  7:00pm NPO After: 6:00am   IV Site: R Forearm  IV 0.9% NS with Angio Cath:  22g  Chest Size (in):  46"  IV Started by: Rolene Course, RN  Height: 5\' 9"  (1.753 m)  Cup Size: n/a  BMI:  Body mass index is 23.91 kg/(m^2). Weight:  162 lb (73.483 kg)   Tech Comments: NO STICKS right arm due to dialysis port.    Nuclear Med Study 1 or 2 day study: 1 day  Stress Test Type:  [place]  Order Authorizing Provider:  Fransico Him, MD   Resting Radionuclide: Technetium 81m Sestamibi  Resting Radionuclide Dose: 10.9 mCi   Stress Radionuclide:  Technetium 61m Sestamibi  Stress Radionuclide Dose: 31.0 mCi           Stress Protocol Rest HR: 76 Stress HR: 83  Rest BP: 211/79 Stress BP: 198/67  Exercise Time (min): n/a METS: n/a   Predicted Max HR: 145 bpm % Max HR: 62.07 bpm Rate Pressure Product: 18990  Dose of Adenosine (mg):  n/a Dose of Lexiscan: 0.4 mg  Dose of Atropine (mg): n/a Dose of Dobutamine: n/a mcg/kg/min (at max HR)  Stress Test Technologist: Leane Para, CCT Nuclear Technologist: Imagene Riches, CNMT   Rest Procedure:  Myocardial perfusion imaging was performed at rest 45 minutes following the intravenous administration of Technetium 87m Sestamibi. Stress Procedure:  The patient received IV Lexiscan 0.4 mg over 15-seconds.  Technetium 46m  Sestamibi injected IV at 30-seconds. Patient experienced SOB and 75 mg Aminophylline IV was administered.  There were no significant changes with Lexiscan.  Quantitative spect images were obtained after a 45 minute delay.  Transient Ischemic Dilatation (Normal <1.22):  1.10  QGS EDV:  99 ml QGS ESV:  46 ml LV Ejection Fraction: 54%  Rest ECG: NSR with non-specific ST-T wave changes  Stress ECG: No significant change from baseline ECG  QPS Raw Data Images:  Normal; no motion artifact; normal heart/lung ratio. Stress Images:  Normal homogeneous uptake in all areas of the myocardium. Rest Images:  Normal homogeneous uptake in all areas of the myocardium. Subtraction (SDS):  No evidence of ischemia.  Impression Exercise Capacity:  Lexiscan with no exercise. BP Response:  Hypertensive at baseline with normal response Clinical Symptoms:  There is dyspnea. ECG Impression:  No significant ECG changes with Lexiscan. Comparison with Prior Nuclear Study: No significant change from previous study  Overall Impression:  Normal stress nuclear study.  LV Wall Motion:  NL LV Function; NL Wall Motion;EF 54%  Pixie Casino, MD, [facility] Board Certified in Nuclear Cardiology Attending Cardiologist [place], MD  04/12/2014 4:31 PM

## 2014-09-07 ENCOUNTER — Encounter (HOSPITAL_COMMUNITY): Payer: Self-pay | Admitting: Vascular Surgery

## 2014-09-08 ENCOUNTER — Other Ambulatory Visit: Payer: Self-pay | Admitting: Family Medicine

## 2014-09-08 ENCOUNTER — Ambulatory Visit
Admission: RE | Admit: 2014-09-08 | Discharge: 2014-09-08 | Disposition: A | Discharge status: Home / Self Care | Payer: Medicare PPO | Source: Ambulatory Visit | Attending: Family Medicine | Admitting: Family Medicine

## 2014-09-08 DIAGNOSIS — R0989 Other specified symptoms and signs involving the circulatory and respiratory systems: Secondary | ICD-10-CM

## 2014-11-05 IMAGING — CR DG CHEST 2V
2 series · 2 of 2 positions shown · non-contrast
Comparison: 03/09/2012.

CLINICAL DATA: Cough.

CHEST - 2 VIEW

[view not recorded (1 of 2)]
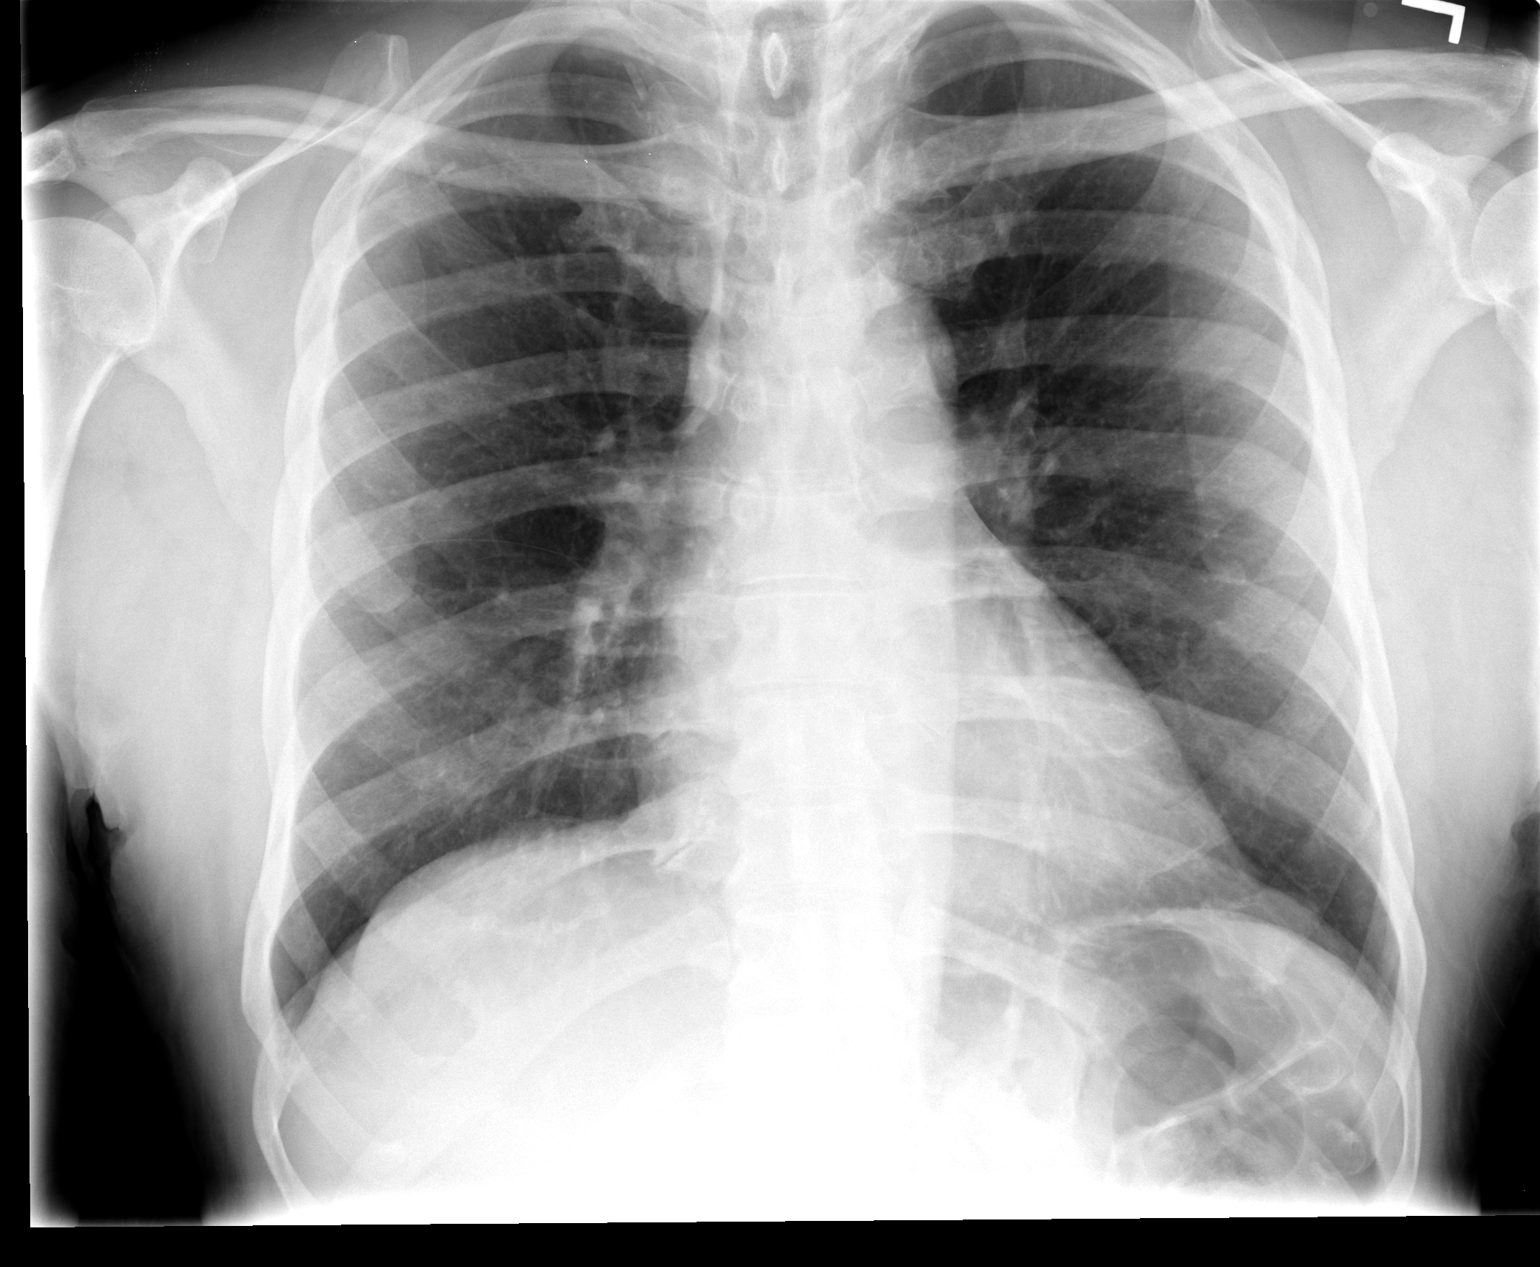

[view not recorded (2 of 2)]
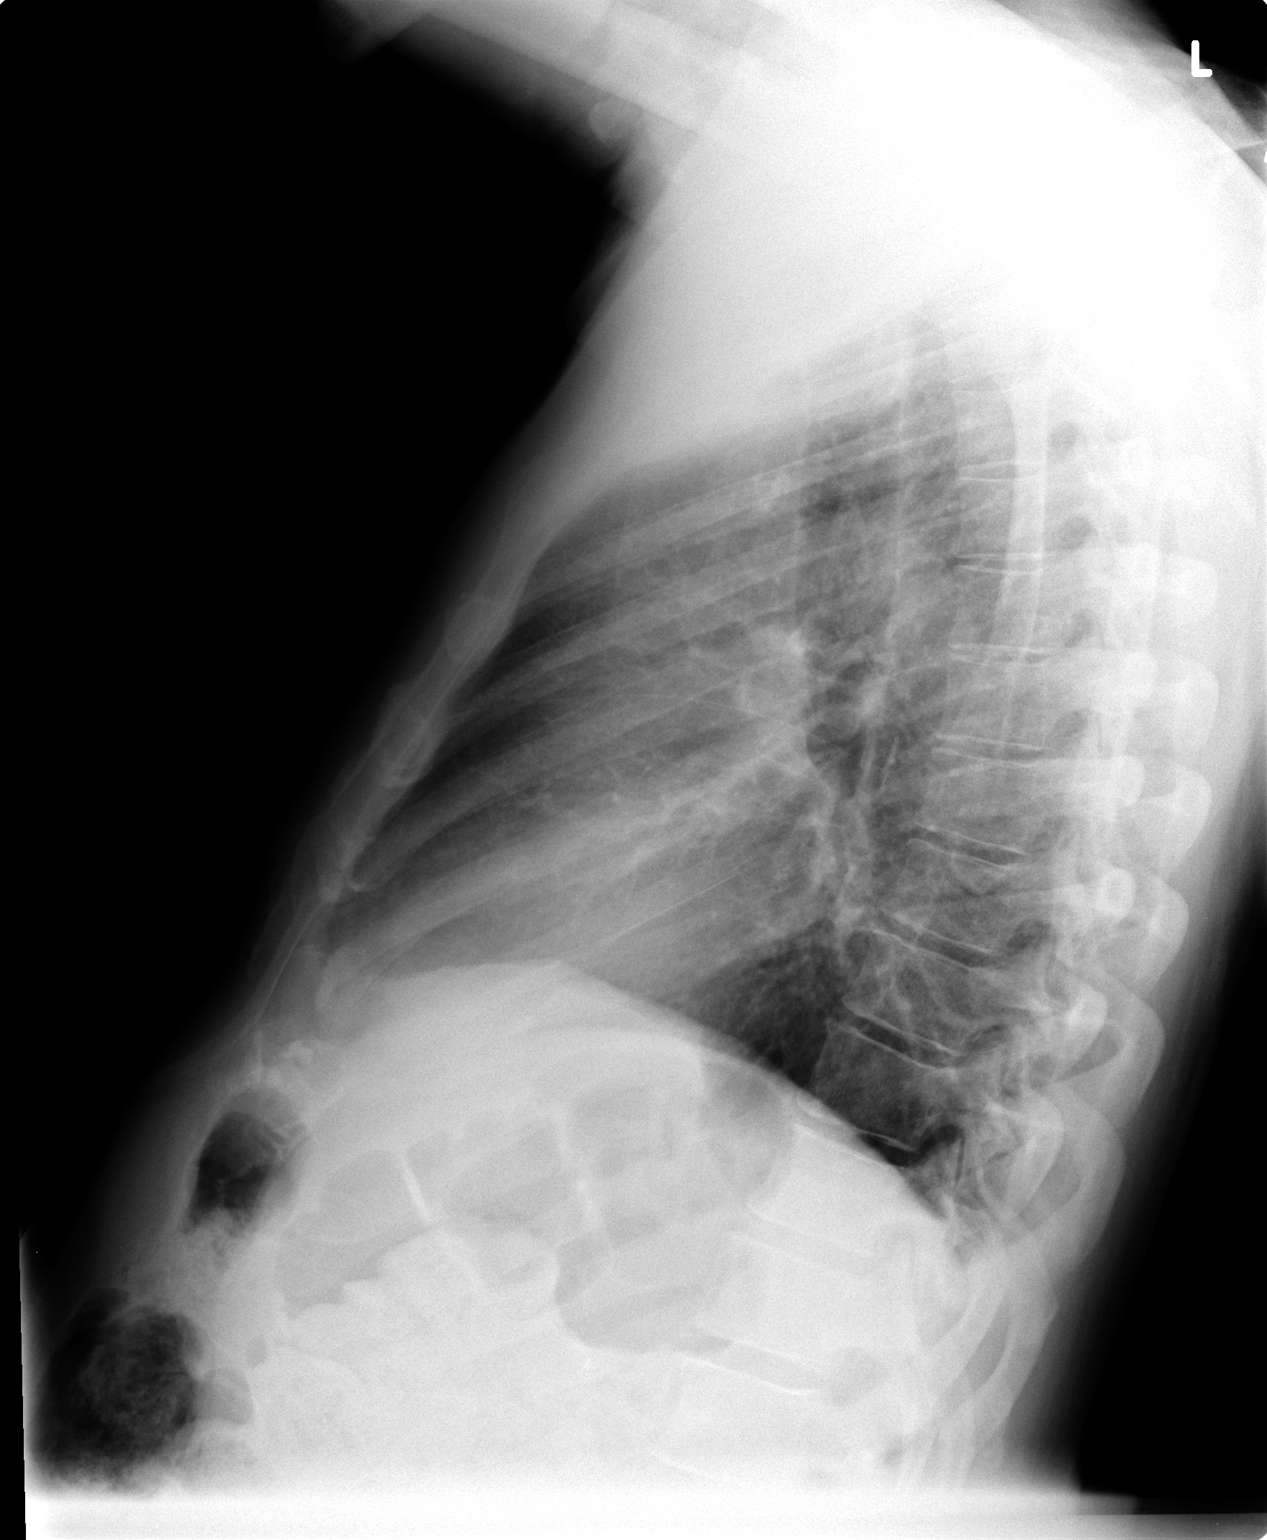

[2 of 2 positions shown; findings below may reference images not displayed]

FINDINGS: The cardiac silhouette, mediastinal and hilar contours
are normal.  The lungs are clear.  The bony thorax is intact.
IMPRESSION: No acute cardiopulmonary findings.

## 2014-11-11 ENCOUNTER — Encounter (INDEPENDENT_AMBULATORY_CARE_PROVIDER_SITE_OTHER): Payer: Self-pay

## 2014-11-11 ENCOUNTER — Ambulatory Visit (HOSPITAL_COMMUNITY)
Admission: RE | Admit: 2014-11-11 | Discharge: 2014-11-11 | Disposition: A | Discharge status: Home / Self Care | Payer: Medicare PPO | Source: Ambulatory Visit | Attending: Nephrology | Admitting: Nephrology

## 2014-11-11 ENCOUNTER — Other Ambulatory Visit (HOSPITAL_COMMUNITY): Payer: Self-pay | Admitting: Nephrology

## 2014-11-11 DIAGNOSIS — N186 End stage renal disease: Secondary | ICD-10-CM | POA: Diagnosis present

## 2014-11-11 DIAGNOSIS — T8241XA Breakdown (mechanical) of vascular dialysis catheter, initial encounter: Secondary | ICD-10-CM

## 2014-11-11 DIAGNOSIS — Z992 Dependence on renal dialysis: Secondary | ICD-10-CM | POA: Insufficient documentation

## 2014-11-11 MED ORDER — LIDOCAINE HCL 1 % IJ SOLN
INTRAMUSCULAR | Status: AC
  Filled 2014-11-11: qty 20

## 2014-11-11 MED ORDER — HEPARIN SODIUM (PORCINE) 1000 UNIT/ML IJ SOLN
INTRAMUSCULAR | Status: AC
  Administered 2014-11-11: 3000 [IU]
  Filled 2014-11-11: qty 1

## 2014-11-11 MED ORDER — MIDAZOLAM HCL 2 MG/2ML IJ SOLN
INTRAMUSCULAR | Status: AC
  Filled 2014-11-11: qty 2

## 2014-11-11 MED ORDER — IOHEXOL 300 MG/ML  SOLN
100.0000 mL | Freq: Once | INTRAMUSCULAR | Status: AC | PRN
  Administered 2014-11-11: 60 mL via INTRAVENOUS

## 2014-11-11 MED ORDER — ALTEPLASE 100 MG IV SOLR
2.0000 mg | Freq: Once | INTRAVENOUS | Status: AC
  Administered 2014-11-11: 2 mg
  Filled 2014-11-11: qty 2

## 2014-11-11 MED ORDER — HEPARIN SODIUM (PORCINE) 1000 UNIT/ML IJ SOLN: 10000.0000 [IU] | Freq: Once | INTRAMUSCULAR | Status: AC

## 2014-11-11 MED ORDER — FENTANYL CITRATE 0.05 MG/ML IJ SOLN
INTRAMUSCULAR | Status: AC
  Filled 2014-11-11: qty 2

## 2014-11-11 MED ORDER — HEPARIN SODIUM (PORCINE) 1000 UNIT/ML IJ SOLN
INTRAMUSCULAR | Status: AC
  Filled 2014-11-11: qty 1

## 2014-11-11 NOTE — Procedures (Signed)
Declot R UAG Venous PTA No complication No blood loss. See complete dictation in [facility].

## 2015-01-17 ENCOUNTER — Other Ambulatory Visit (HOSPITAL_COMMUNITY): Payer: Self-pay | Admitting: Nephrology

## 2015-01-17 ENCOUNTER — Ambulatory Visit (HOSPITAL_COMMUNITY)
Admission: RE | Admit: 2015-01-17 | Discharge: 2015-01-17 | Disposition: A | Discharge status: Home / Self Care | Payer: Medicare PPO | Source: Ambulatory Visit | Attending: Nephrology | Admitting: Nephrology

## 2015-01-17 DIAGNOSIS — R042 Hemoptysis: Secondary | ICD-10-CM

## 2015-02-05 ENCOUNTER — Ambulatory Visit: Payer: Medicare PPO | Admitting: Nurse Practitioner

## 2015-02-06 ENCOUNTER — Ambulatory Visit (INDEPENDENT_AMBULATORY_CARE_PROVIDER_SITE_OTHER): Payer: Medicare PPO | Admitting: Nurse Practitioner

## 2015-02-06 ENCOUNTER — Encounter: Payer: Self-pay | Admitting: Nurse Practitioner

## 2015-02-06 VITALS — BP 112/58 | HR 77 | Temp 97.2°F | Ht 69.0 in | Wt 167.0 lb

## 2015-02-06 DIAGNOSIS — K5900 Constipation, unspecified: Secondary | ICD-10-CM | POA: Diagnosis not present

## 2015-02-06 DIAGNOSIS — K625 Hemorrhage of anus and rectum: Secondary | ICD-10-CM | POA: Diagnosis not present

## 2015-02-06 LAB — CBC WITH DIFFERENTIAL/PLATELET
BASOS PCT: 1 % (ref 0–1)
Basophils Absolute: 0.1 10*3/uL (ref 0.0–0.1)
Eosinophils Absolute: 0.4 10*3/uL (ref 0.0–0.7)
Eosinophils Relative: 4 % (ref 0–5)
HEMATOCRIT: 29.5 % — AB (ref 39.0–52.0)
Hemoglobin: 9.9 g/dL — ABNORMAL LOW (ref 13.0–17.0)
Lymphocytes Relative: 16 % (ref 12–46)
Lymphs Abs: 1.8 10*3/uL (ref 0.7–4.0)
MCH: 35.9 pg — ABNORMAL HIGH (ref 26.0–34.0)
MCHC: 33.6 g/dL (ref 30.0–36.0)
MCV: 106.9 fL — AB (ref 78.0–100.0)
MONO ABS: 0.7 10*3/uL (ref 0.1–1.0)
MONOS PCT: 6 % (ref 3–12)
MPV: 9.6 fL (ref 8.6–12.4)
NEUTROS ABS: 8 10*3/uL — AB (ref 1.7–7.7)
Neutrophils Relative %: 73 % (ref 43–77)
Platelets: 243 10*3/uL (ref 150–400)
RBC: 2.76 MIL/uL — ABNORMAL LOW (ref 4.22–5.81)
RDW: 17.1 % — ABNORMAL HIGH (ref 11.5–15.5)
WBC: 11 10*3/uL — AB (ref 4.0–10.5)

## 2015-02-06 LAB — COMPREHENSIVE METABOLIC PANEL
ALT: <8 U/L (ref 0–53)
AST: 9 U/L (ref 0–37)
Albumin: 3.7 g/dL (ref 3.5–5.2)
Alkaline Phosphatase: 70 U/L (ref 39–117)
BUN: 41 mg/dL — AB (ref 6–23)
CALCIUM: 8.5 mg/dL (ref 8.4–10.5)
CHLORIDE: 94 meq/L — AB (ref 96–112)
CO2: 26 mEq/L (ref 19–32)
CREATININE: 9.19 mg/dL — AB (ref 0.50–1.35)
Glucose, Bld: 281 mg/dL — ABNORMAL HIGH (ref 70–99)
Potassium: 4.8 mEq/L (ref 3.5–5.3)
Sodium: 135 mEq/L (ref 135–145)
Total Bilirubin: 0.4 mg/dL (ref 0.2–1.2)
Total Protein: 6.9 g/dL (ref 6.0–8.3)

## 2015-02-06 MED ORDER — LINACLOTIDE 145 MCG PO CAPS: 145.0000 ug | ORAL_CAPSULE | Freq: Every day | ORAL | Status: AC

## 2015-02-06 NOTE — Assessment & Plan Note (Signed)
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 Friable mucosa likely source of bleeding in setting of constipation. We'll refill his Linzess medication today and have him take this daily to prevent further constipation. We'll check a CBC and CMP today. Return for further evaluation in 3 months.

## 2015-02-06 NOTE — Assessment & Plan Note (Signed)
Patient with a single episode of rectal bleeding in the setting of constipation approximately 3 months ago which is self resolved. No further bleeding. We'll recheck CBC and CMP today. We'll refill his Linzess medication to help with the constipation which is likely source of bleeding etiology. Of note his rectum was noted to have changes consistent with radiation-induced proctitis. Likely bleeding source benign anorectal source in the setting of proctitis, friable mucosa, and constipation. Patient instructed to notify us of any recurrent bleeding. Return for 3 month evaluation.

## 2015-02-06 NOTE — Progress Notes (Signed)
Referring Provider: Alroy Dust, L.Marlou Sa, MD Primary Care Physician:  Donnie Coffin, MD Primary GI:  Dr. Oneida Alar  Chief Complaint  Patient presents with  . Blood In Stools  . blood in urine    HPI:   58 or old male presents for blood in urine and stool. Was referred by the kidney center. Patient has chronic kidney disease and is dialyzed on Tuesdays, Thursday, Saturday. Last colonoscopy was completed on 3-15 for history of colonic adenoma. Findings included a adequate preparation, changes consistent with history of radiation proctitis, colonic diverticulosis, and colonic polyps which were removed including a 5 mm polyp in the mid descending segment and father polyps in the sigmoid segment. Pathology of these polyps showed descending segment included one fragment of tubular adenoma, in the sigmoid segment contained hyperplastic polyps. No high-grade dysplasia or malignancy identified. Recommended repeat exam in 5 years.  Today he states he was dialyzed yesterday. His scheduled was changed to Monday, Wednesday, and Friday. Patient is a bit difficult to understand today and history is subsequently limited. Patient falling asleep multiple times during the visit. States he was having some blood in his mouth. Asked 3 separate times and denies blood in his stool each time. "Not to my knowledge, no." Does admit one instance of blood in his urine. Denies dysphagia. Denies hemoptysis. Denies N/V. Denies hematemesis. Has a bowel movement on a varying schedule from daily to once every 2-3 days. Can't remember if he takes Linzess or not. Denies melena. Is "real constipated." Patient became more alert when another practitioner in seen the patient previously came into the room to speak to him. Patient states he gets sleepy when he is not actively doing something are being spoken to. However, he has no issues driving and when he is driving he states he is alert. Patient was able to independently ambulate to the exam  table get on the exam table for the duration exam and return to his chair without any physical assistance, sleepiness, stumbling, dizziness, lightheadedness, syncope, or near syncope.  Past Medical History  Diagnosis Date  . Hypertension   . Hyperlipidemia   . Chronic kidney disease     dialysis Tues, Thur, Sat  . Diabetes mellitus   . Cancer     Prostate cancer treated with radiation therapy  . Thyroid disease     hyperparathyroidism  . Anemia   . Positive PPD   . Clot 05/2006    Arterial clot right leg   . Hemodialysis patient 6/13  . Diverticulosis     Past Surgical History  Procedure Laterality Date  . 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 Av fistula placement  11/04/2010    left and left  . Av fistula placement  11/20/2011    Procedure: INSERTION OF ARTERIOVENOUS (AV) GORE-TEX GRAFT ARM;  Surgeon: Angelia Mould, MD;  Location: [facility] OR;  Service: Vascular;  Laterality: Right;  . Colonoscopy N/A 11/10/2012    NKN:[id number] proctitis/Pancolonic diverticulosis/(TUBULAR ADENOMA)5 mm polyp in the transverse segment/4 mm polyps in the base of the cecum and diminutive polyp in the descending segment. INADEQUATE BOWEL PREP  . Colonoscopy N/A 11/28/2013    RMR: Radiation proctitis. Colonic diverticulosis. Colonic polyps-removed as described above.   [name] N/A 10/08/2012    Procedure: [name];  Surgeon: Elam Dutch, MD;  Location: [facility] CATH LAB;  Service: Cardiovascular;  Laterality: N/A;    Current Outpatient Prescriptions  Medication Sig Dispense Refill  . aspirin 81  MG tablet Take 81-162 mg by mouth 2 (two) times daily.     Marland Kitchen buPROPion (WELLBUTRIN SR) 150 MG 12 hr tablet Take 150 mg by mouth daily.     . calcium acetate (PHOSLO) 667 MG capsule Take 2,001 mg by mouth 3 (three) times daily.     . cloNIDine (CATAPRES) 0.2 MG tablet Take 0.2 mg by mouth 3 (three) times daily.     Marland Kitchen doxazosin (CARDURA) 8 MG tablet Take 8 mg by mouth at bedtime.     .  furosemide (LASIX) 40 MG tablet Take 40 mg by mouth 2 (two) times daily.      . insulin detemir (LEVEMIR) 100 UNIT/ML injection Inject 8-20 Units into the skin at bedtime.     . Linaclotide (LINZESS) 145 MCG CAPS capsule Take 1 capsule (145 mcg total) by mouth daily. Take 30 minutes before breakfast 30 capsule 11  . saxagliptin HCl (ONGLYZA) 5 MG TABS tablet Take 5 mg by mouth daily.    . simvastatin (ZOCOR) 20 MG tablet Take 20 mg by mouth at bedtime.      . Vitamin D, Ergocalciferol, (DRISDOL) 50000 UNITS CAPS Take 50,000 Units by mouth every 7 (seven) days. mondays     No current facility-administered medications for this visit.    Allergies as of 02/06/2015  . (No Known Allergies)    Family History  Problem Relation Age of Onset  . Heart disease Mother   . Diabetes Mother   . Hypertension Mother   . Stroke Mother   . Heart disease Father   . Diabetes Father   . Heart disease Sister   . Diabetes Brother   . Colon cancer Neg Hx     History   Social History  . Marital Status: Married    Spouse Name: N/A  . Number of Children: N/A  . Years of Education: N/A   Social History Main Topics  . Smoking status: Current Every Day Smoker -- 1.00 packs/day for 50 years    Types: Cigarettes  . Smokeless tobacco: Not on file  . Alcohol Use: No  . Drug Use: No  . Sexual Activity: Not on file   Other Topics Concern  . None   Social History Narrative    Review of Systems: General: Negative for anorexia, weight loss, fever, chills, fatigue, weakness. Eyes: Negative for vision changes.  ENT: Negative for hoarseness, difficulty swallowing , nasal congestion. CV: Negative for chest pain, angina, palpitations, dyspnea on exertion, peripheral edema.  Respiratory: Negative for dyspnea at rest, dyspnea on exertion, cough, sputum, wheezing.  GI: See history of present illness. Derm: Negative for rash or itching.  Neuro: Negative for weakness, memory loss, confusion.  Psych: Negative  for anxiety, depression.  Endo: Negative for unusual weight change.  Heme: Negative for bruising or bleeding. Allergy: Negative for rash or hives.   Physical Exam: BP 112/58 mmHg  Pulse 77  Temp(Src) 97.2 F (36.2 C)  Ht 5\' 9"  (1.753 m)  Wt 167 lb (75.751 kg)  BMI 24.65 kg/m2 General:   Alert and oriented. No distress noted. Pleasant and cooperative.  Head:  Normocephalic and atraumatic. Eyes:  Conjuctiva clear without scleral icterus. Mouth:  Oral mucosa pink and moist. Good dentition. No lesions. Neck:  Supple, without mass or thyromegaly. Lungs:  Clear to auscultation bilaterally. No wheezes, rales, or rhonchi. No distress.  Heart:  S1, S2 present without murmurs, rubs, or gallops. Regular rate and rhythm. Abdomen:  +BS, soft, non-tender and non-distended. No  rebound or guarding. No HSM or masses noted. Msk:  Right LE AKA noted. Extremities:  Without edema LLE. RLE AKA. Neurologic:  Alert and  oriented x4;  grossly normal neurologically. Skin:  Intact without significant lesions or rashes. Psych:  Alert and cooperative. Normal mood and affect.    02/06/2015 10:53 AM

## 2015-02-06 NOTE — Patient Instructions (Signed)
1. I want to check a couple labs for you today. Please have these drawn at the lab Center as soon as he can. 2. I am refilling her Linzess medication which helps with constipation. Please take this daily. 3. Call us if you have any worsening bleeding, worsening symptoms, change in symptoms. 4. Otherwise we will have you come back in 3 months to reevaluate your symptoms.

## 2015-02-07 NOTE — Progress Notes (Signed)
CC'ED TO PCP 

## 2015-04-05 ENCOUNTER — Other Ambulatory Visit: Payer: Self-pay | Admitting: *Deleted

## 2015-04-05 DIAGNOSIS — T82510A Breakdown (mechanical) of surgically created arteriovenous fistula, initial encounter: Secondary | ICD-10-CM

## 2015-04-05 DIAGNOSIS — Z01810 Encounter for preprocedural cardiovascular examination: Secondary | ICD-10-CM

## 2015-04-05 DIAGNOSIS — N186 End stage renal disease: Secondary | ICD-10-CM

## 2015-04-19 ENCOUNTER — Inpatient Hospital Stay (HOSPITAL_COMMUNITY)
Admission: EM | Admit: 2015-04-19 | Discharge: 2015-04-24 | DRG: 252 | Disposition: A | Discharge status: Home Health | Payer: Medicare PPO | Attending: Vascular Surgery | Admitting: Vascular Surgery

## 2015-04-19 ENCOUNTER — Encounter (HOSPITAL_COMMUNITY): Payer: Self-pay

## 2015-04-19 ENCOUNTER — Emergency Department (HOSPITAL_COMMUNITY): Payer: Medicare PPO | Admitting: Certified Registered Nurse Anesthetist

## 2015-04-19 ENCOUNTER — Encounter (HOSPITAL_COMMUNITY)
Admission: EM | Disposition: A | Discharge status: Home Health | Payer: Self-pay | Source: Home / Self Care | Attending: Vascular Surgery

## 2015-04-19 DIAGNOSIS — N186 End stage renal disease: Secondary | ICD-10-CM | POA: Diagnosis present

## 2015-04-19 DIAGNOSIS — Z992 Dependence on renal dialysis: Secondary | ICD-10-CM

## 2015-04-19 DIAGNOSIS — L02413 Cutaneous abscess of right upper limb: Secondary | ICD-10-CM | POA: Diagnosis present

## 2015-04-19 DIAGNOSIS — Z8546 Personal history of malignant neoplasm of prostate: Secondary | ICD-10-CM

## 2015-04-19 DIAGNOSIS — E1122 Type 2 diabetes mellitus with diabetic chronic kidney disease: Secondary | ICD-10-CM | POA: Diagnosis present

## 2015-04-19 DIAGNOSIS — M898X9 Other specified disorders of bone, unspecified site: Secondary | ICD-10-CM | POA: Diagnosis present

## 2015-04-19 DIAGNOSIS — Z794 Long term (current) use of insulin: Secondary | ICD-10-CM | POA: Diagnosis not present

## 2015-04-19 DIAGNOSIS — Z833 Family history of diabetes mellitus: Secondary | ICD-10-CM | POA: Diagnosis not present

## 2015-04-19 DIAGNOSIS — T827XXA Infection and inflammatory reaction due to other cardiac and vascular devices, implants and grafts, initial encounter: Principal | ICD-10-CM | POA: Diagnosis present

## 2015-04-19 DIAGNOSIS — Z7982 Long term (current) use of aspirin: Secondary | ICD-10-CM

## 2015-04-19 DIAGNOSIS — E46 Unspecified protein-calorie malnutrition: Secondary | ICD-10-CM | POA: Diagnosis present

## 2015-04-19 DIAGNOSIS — F17210 Nicotine dependence, cigarettes, uncomplicated: Secondary | ICD-10-CM | POA: Diagnosis present

## 2015-04-19 DIAGNOSIS — Z823 Family history of stroke: Secondary | ICD-10-CM | POA: Diagnosis not present

## 2015-04-19 DIAGNOSIS — Y832 Surgical operation with anastomosis, bypass or graft as the cause of abnormal reaction of the patient, or of later complication, without mention of misadventure at the time of the procedure: Secondary | ICD-10-CM | POA: Diagnosis present

## 2015-04-19 DIAGNOSIS — Z89511 Acquired absence of right leg below knee: Secondary | ICD-10-CM | POA: Diagnosis not present

## 2015-04-19 DIAGNOSIS — R7611 Nonspecific reaction to tuberculin skin test without active tuberculosis: Secondary | ICD-10-CM | POA: Diagnosis present

## 2015-04-19 DIAGNOSIS — E785 Hyperlipidemia, unspecified: Secondary | ICD-10-CM | POA: Diagnosis present

## 2015-04-19 DIAGNOSIS — I120 Hypertensive chronic kidney disease with stage 5 chronic kidney disease or end stage renal disease: Secondary | ICD-10-CM | POA: Diagnosis present

## 2015-04-19 DIAGNOSIS — Z923 Personal history of irradiation: Secondary | ICD-10-CM

## 2015-04-19 DIAGNOSIS — D649 Anemia, unspecified: Secondary | ICD-10-CM | POA: Diagnosis present

## 2015-04-19 DIAGNOSIS — Z8249 Family history of ischemic heart disease and other diseases of the circulatory system: Secondary | ICD-10-CM

## 2015-04-19 DIAGNOSIS — N2581 Secondary hyperparathyroidism of renal origin: Secondary | ICD-10-CM | POA: Diagnosis present

## 2015-04-19 DIAGNOSIS — F419 Anxiety disorder, unspecified: Secondary | ICD-10-CM | POA: Diagnosis present

## 2015-04-19 DIAGNOSIS — B9689 Other specified bacterial agents as the cause of diseases classified elsewhere: Secondary | ICD-10-CM | POA: Diagnosis present

## 2015-04-19 DIAGNOSIS — T82898A Other specified complication of vascular prosthetic devices, implants and grafts, initial encounter: Secondary | ICD-10-CM | POA: Diagnosis not present

## 2015-04-19 DIAGNOSIS — Z79899 Other long term (current) drug therapy: Secondary | ICD-10-CM

## 2015-04-19 HISTORY — PX: AV FISTULA PLACEMENT: SHX1204

## 2015-04-19 HISTORY — DX: Anxiety disorder, unspecified: F41.9

## 2015-04-19 LAB — CBC WITH DIFFERENTIAL/PLATELET
BASOS ABS: 0 10*3/uL (ref 0.0–0.1)
Basophils Relative: 0 % (ref 0–1)
Eosinophils Absolute: 0.2 10*3/uL (ref 0.0–0.7)
Eosinophils Relative: 2 % (ref 0–5)
HCT: 29.6 % — ABNORMAL LOW (ref 39.0–52.0)
Hemoglobin: 9.4 g/dL — ABNORMAL LOW (ref 13.0–17.0)
LYMPHS ABS: 1.3 10*3/uL (ref 0.7–4.0)
LYMPHS PCT: 12 % (ref 12–46)
MCH: 33 pg (ref 26.0–34.0)
MCHC: 31.8 g/dL (ref 30.0–36.0)
MCV: 103.9 fL — ABNORMAL HIGH (ref 78.0–100.0)
MONO ABS: 0.6 10*3/uL (ref 0.1–1.0)
MONOS PCT: 6 % (ref 3–12)
NEUTROS ABS: 9 10*3/uL — AB (ref 1.7–7.7)
NEUTROS PCT: 80 % — AB (ref 43–77)
PLATELETS: 375 10*3/uL (ref 150–400)
RBC: 2.85 MIL/uL — AB (ref 4.22–5.81)
RDW: 18.6 % — ABNORMAL HIGH (ref 11.5–15.5)
WBC: 11.1 10*3/uL — ABNORMAL HIGH (ref 4.0–10.5)

## 2015-04-19 LAB — GLUCOSE, CAPILLARY
GLUCOSE-CAPILLARY: 200 mg/dL — AB (ref 65–99)
GLUCOSE-CAPILLARY: 183 mg/dL — AB (ref 65–99)
Glucose-Capillary: 262 mg/dL — ABNORMAL HIGH (ref 65–99)

## 2015-04-19 LAB — CBC
HCT: 31.5 % — ABNORMAL LOW (ref 39.0–52.0)
HEMOGLOBIN: 10.2 g/dL — AB (ref 13.0–17.0)
MCH: 33.4 pg (ref 26.0–34.0)
MCHC: 32.4 g/dL (ref 30.0–36.0)
MCV: 103.3 fL — ABNORMAL HIGH (ref 78.0–100.0)
PLATELETS: 374 10*3/uL (ref 150–400)
RBC: 3.05 MIL/uL — ABNORMAL LOW (ref 4.22–5.81)
RDW: 18.8 % — ABNORMAL HIGH (ref 11.5–15.5)
WBC: 15 10*3/uL — AB (ref 4.0–10.5)

## 2015-04-19 LAB — BASIC METABOLIC PANEL
Anion gap: 12 (ref 5–15)
BUN: 35 mg/dL — ABNORMAL HIGH (ref 6–20)
CALCIUM: 8.3 mg/dL — AB (ref 8.9–10.3)
CO2: 26 mmol/L (ref 22–32)
Chloride: 92 mmol/L — ABNORMAL LOW (ref 101–111)
Creatinine, Ser: 8.59 mg/dL — ABNORMAL HIGH (ref 0.61–1.24)
GFR calc non Af Amer: 5 mL/min — ABNORMAL LOW (ref >60)
GFR, EST AFRICAN AMERICAN: 6 mL/min — AB (ref >60)
Glucose, Bld: 337 mg/dL — ABNORMAL HIGH (ref 65–99)
Potassium: 4.4 mmol/L (ref 3.5–5.1)
SODIUM: 130 mmol/L — AB (ref 135–145)

## 2015-04-19 LAB — CREATININE, SERUM
CREATININE: 9.15 mg/dL — AB (ref 0.61–1.24)
GFR calc non Af Amer: 5 mL/min — ABNORMAL LOW (ref >60)
GFR, EST AFRICAN AMERICAN: 6 mL/min — AB (ref >60)

## 2015-04-19 LAB — I-STAT CG4 LACTIC ACID, ED: Lactic Acid, Venous: 1.34 mmol/L (ref 0.5–2.0)

## 2015-04-19 SURGERY — INSERTION OF ARTERIOVENOUS (AV) GORE-TEX GRAFT ARM
Anesthesia: General | Site: Arm Upper | Laterality: Right

## 2015-04-19 MED ORDER — BUPROPION HCL ER (XL) 150 MG PO TB24
150.0000 mg | ORAL_TABLET | Freq: Every day | ORAL | Status: DC
  Administered 2015-04-20 – 2015-04-24 (×5): 150 mg via ORAL
  Filled 2015-04-19 (×5): qty 1

## 2015-04-19 MED ORDER — BISACODYL 10 MG RE SUPP: 10.0000 mg | Freq: Every day | RECTAL | Status: DC | PRN

## 2015-04-19 MED ORDER — METOPROLOL TARTRATE 1 MG/ML IV SOLN
2.0000 mg | INTRAVENOUS | Status: DC | PRN
Start: 2015-04-19 — End: 2015-04-24

## 2015-04-19 MED ORDER — LINACLOTIDE 145 MCG PO CAPS
145.0000 ug | ORAL_CAPSULE | Freq: Every day | ORAL | Status: DC
  Administered 2015-04-20 – 2015-04-24 (×4): 145 ug via ORAL
  Filled 2015-04-19 (×7): qty 1

## 2015-04-19 MED ORDER — SURGIFOAM 100 EX MISC
CUTANEOUS | Status: DC | PRN
  Administered 2015-04-19: 20 mL via TOPICAL

## 2015-04-19 MED ORDER — SODIUM CHLORIDE 0.9 % IV SOLN: 250.0000 mL | INTRAVENOUS | Status: DC | PRN

## 2015-04-19 MED ORDER — FUROSEMIDE 40 MG PO TABS
40.0000 mg | ORAL_TABLET | Freq: Two times a day (BID) | ORAL | Status: DC
  Administered 2015-04-21 – 2015-04-24 (×6): 40 mg via ORAL
  Filled 2015-04-19 (×11): qty 1

## 2015-04-19 MED ORDER — GABAPENTIN 100 MG PO CAPS
100.0000 mg | ORAL_CAPSULE | Freq: Two times a day (BID) | ORAL | Status: DC
  Administered 2015-04-19 – 2015-04-24 (×9): 100 mg via ORAL
  Filled 2015-04-19 (×13): qty 1

## 2015-04-19 MED ORDER — HYDRALAZINE HCL 20 MG/ML IJ SOLN: 10.0000 mg | INTRAMUSCULAR | Status: DC | PRN

## 2015-04-19 MED ORDER — VANCOMYCIN HCL 1000 MG IV SOLR
1000.0000 mg | INTRAVENOUS | Status: DC | PRN
  Administered 2015-04-19: 1000 mg via INTRAVENOUS

## 2015-04-19 MED ORDER — VANCOMYCIN HCL IN DEXTROSE 750-5 MG/150ML-% IV SOLN
750.0000 mg | INTRAVENOUS | Status: DC
  Administered 2015-04-20: 750 mg via INTRAVENOUS
  Filled 2015-04-19: qty 150

## 2015-04-19 MED ORDER — SODIUM CHLORIDE 0.9 % IJ SOLN
3.0000 mL | Freq: Two times a day (BID) | INTRAMUSCULAR | Status: DC
  Administered 2015-04-20 – 2015-04-24 (×5): 3 mL via INTRAVENOUS

## 2015-04-19 MED ORDER — OXYCODONE HCL 5 MG PO TABS: 5.0000 mg | ORAL_TABLET | ORAL | Status: DC | PRN

## 2015-04-19 MED ORDER — SODIUM CHLORIDE 0.9 % IJ SOLN: 3.0000 mL | INTRAMUSCULAR | Status: DC | PRN

## 2015-04-19 MED ORDER — MORPHINE SULFATE 2 MG/ML IJ SOLN: 2.0000 mg | INTRAMUSCULAR | Status: DC | PRN

## 2015-04-19 MED ORDER — ASPIRIN EC 81 MG PO TBEC
81.0000 mg | DELAYED_RELEASE_TABLET | Freq: Two times a day (BID) | ORAL | Status: DC
  Administered 2015-04-19 – 2015-04-24 (×10): 81 mg via ORAL
  Filled 2015-04-19 (×12): qty 1

## 2015-04-19 MED ORDER — LABETALOL HCL 5 MG/ML IV SOLN
10.0000 mg | INTRAVENOUS | Status: DC | PRN
  Administered 2015-04-20: 10 mg via INTRAVENOUS
  Filled 2015-04-19 (×3): qty 4

## 2015-04-19 MED ORDER — CALCIUM ACETATE (PHOS BINDER) 667 MG PO CAPS
2001.0000 mg | ORAL_CAPSULE | Freq: Two times a day (BID) | ORAL | Status: DC
  Administered 2015-04-20: 2001 mg via ORAL
  Filled 2015-04-19 (×3): qty 3

## 2015-04-19 MED ORDER — LIDOCAINE HCL 4 % MT SOLN
OROMUCOSAL | Status: DC | PRN
  Administered 2015-04-19: 4 mL via TOPICAL

## 2015-04-19 MED ORDER — VANCOMYCIN HCL 10 G IV SOLR
1500.0000 mg | Freq: Once | INTRAVENOUS | Status: AC
  Administered 2015-04-19: 1500 mg via INTRAVENOUS
  Filled 2015-04-19: qty 1500

## 2015-04-19 MED ORDER — CLONIDINE HCL 0.2 MG PO TABS
0.2000 mg | ORAL_TABLET | Freq: Three times a day (TID) | ORAL | Status: DC
  Administered 2015-04-19 – 2015-04-24 (×11): 0.2 mg via ORAL
  Filled 2015-04-19 (×16): qty 1

## 2015-04-19 MED ORDER — HYDRALAZINE HCL 20 MG/ML IJ SOLN
5.0000 mg | INTRAMUSCULAR | Status: DC | PRN
  Administered 2015-04-19: 5 mg via INTRAVENOUS
  Filled 2015-04-19: qty 1

## 2015-04-19 MED ORDER — 0.9 % SODIUM CHLORIDE (POUR BTL) OPTIME
TOPICAL | Status: DC | PRN
  Administered 2015-04-19: 1000 mL

## 2015-04-19 MED ORDER — PIPERACILLIN-TAZOBACTAM IN DEX 2-0.25 GM/50ML IV SOLN
2.2500 g | Freq: Three times a day (TID) | INTRAVENOUS | Status: DC
  Administered 2015-04-20 – 2015-04-22 (×7): 2.25 g via INTRAVENOUS
  Filled 2015-04-19 (×10): qty 50

## 2015-04-19 MED ORDER — GUAIFENESIN-DM 100-10 MG/5ML PO SYRP: 15.0000 mL | ORAL_SOLUTION | ORAL | Status: DC | PRN

## 2015-04-19 MED ORDER — VANCOMYCIN HCL IN DEXTROSE 1-5 GM/200ML-% IV SOLN
INTRAVENOUS | Status: AC
  Filled 2015-04-19: qty 200

## 2015-04-19 MED ORDER — HEPARIN SODIUM (PORCINE) 5000 UNIT/ML IJ SOLN
5000.0000 [IU] | Freq: Three times a day (TID) | INTRAMUSCULAR | Status: DC
  Administered 2015-04-20 – 2015-04-24 (×12): 5000 [IU] via SUBCUTANEOUS
  Filled 2015-04-19 (×16): qty 1

## 2015-04-19 MED ORDER — SODIUM CHLORIDE 0.9 % IV SOLN: INTRAVENOUS | Status: DC | PRN

## 2015-04-19 MED ORDER — ASPIRIN 81 MG PO TABS: 81.0000 mg | ORAL_TABLET | Freq: Two times a day (BID) | ORAL | Status: DC

## 2015-04-19 MED ORDER — VITAMIN D (ERGOCALCIFEROL) 1.25 MG (50000 UNIT) PO CAPS
50000.0000 [IU] | ORAL_CAPSULE | ORAL | Status: DC
  Administered 2015-04-23: 50000 [IU] via ORAL
  Filled 2015-04-19: qty 1

## 2015-04-19 MED ORDER — FENTANYL CITRATE (PF) 100 MCG/2ML IJ SOLN: 50.0000 ug | Freq: Once | INTRAMUSCULAR | Status: DC

## 2015-04-19 MED ORDER — THROMBIN 20000 UNITS EX SOLR
CUTANEOUS | Status: AC
  Filled 2015-04-19: qty 20000

## 2015-04-19 MED ORDER — CALCIUM ACETATE 667 MG PO CAPS: 2001.0000 mg | ORAL_CAPSULE | Freq: Two times a day (BID) | ORAL | Status: DC

## 2015-04-19 MED ORDER — POTASSIUM CHLORIDE CRYS ER 20 MEQ PO TBCR: 20.0000 meq | EXTENDED_RELEASE_TABLET | Freq: Once | ORAL | Status: DC

## 2015-04-19 MED ORDER — LINAGLIPTIN 5 MG PO TABS
5.0000 mg | ORAL_TABLET | Freq: Every day | ORAL | Status: DC
  Administered 2015-04-20 – 2015-04-24 (×5): 5 mg via ORAL
  Filled 2015-04-19 (×5): qty 1

## 2015-04-19 MED ORDER — FENTANYL CITRATE (PF) 100 MCG/2ML IJ SOLN
INTRAMUSCULAR | Status: DC | PRN
  Administered 2015-04-19: 100 ug via INTRAVENOUS
  Administered 2015-04-19: 50 ug via INTRAVENOUS

## 2015-04-19 MED ORDER — QUETIAPINE FUMARATE 100 MG PO TABS
100.0000 mg | ORAL_TABLET | Freq: Every day | ORAL | Status: DC
  Administered 2015-04-19 – 2015-04-23 (×5): 100 mg via ORAL
  Filled 2015-04-19 (×6): qty 1

## 2015-04-19 MED ORDER — SODIUM CHLORIDE 0.9 % IR SOLN
Status: DC | PRN
  Administered 2015-04-19: 500 mL

## 2015-04-19 MED ORDER — ACETAMINOPHEN 325 MG PO TABS: 325.0000 mg | ORAL_TABLET | ORAL | Status: DC | PRN

## 2015-04-19 MED ORDER — PROPOFOL 10 MG/ML IV BOLUS
INTRAVENOUS | Status: DC | PRN
  Administered 2015-04-19: 100 mg via INTRAVENOUS

## 2015-04-19 MED ORDER — SUCCINYLCHOLINE CHLORIDE 20 MG/ML IJ SOLN
INTRAMUSCULAR | Status: DC | PRN
  Administered 2015-04-19: 60 mg via INTRAVENOUS

## 2015-04-19 MED ORDER — DOXAZOSIN MESYLATE 8 MG PO TABS
8.0000 mg | ORAL_TABLET | Freq: Every day | ORAL | Status: DC
  Administered 2015-04-19 – 2015-04-23 (×5): 8 mg via ORAL
  Filled 2015-04-19 (×6): qty 1

## 2015-04-19 MED ORDER — HYDROMORPHONE HCL 1 MG/ML IJ SOLN: 0.5000 mg | INTRAMUSCULAR | Status: DC | PRN

## 2015-04-19 MED ORDER — ONDANSETRON HCL 4 MG/2ML IJ SOLN: 4.0000 mg | Freq: Four times a day (QID) | INTRAMUSCULAR | Status: DC | PRN

## 2015-04-19 MED ORDER — SODIUM CHLORIDE 0.9 % IV SOLN
INTRAVENOUS | Status: DC
  Administered 2015-04-19: 18:00:00 via INTRAVENOUS

## 2015-04-19 MED ORDER — FENTANYL CITRATE (PF) 250 MCG/5ML IJ SOLN
INTRAMUSCULAR | Status: AC
  Filled 2015-04-19: qty 5

## 2015-04-19 MED ORDER — ONDANSETRON HCL 4 MG/2ML IJ SOLN: 4.0000 mg | Freq: Once | INTRAMUSCULAR | Status: DC | PRN

## 2015-04-19 MED ORDER — PHENOL 1.4 % MT LIQD
1.0000 | OROMUCOSAL | Status: DC | PRN
  Filled 2015-04-19: qty 177

## 2015-04-19 MED ORDER — INSULIN DETEMIR 100 UNIT/ML ~~LOC~~ SOLN
20.0000 [IU] | Freq: Every day | SUBCUTANEOUS | Status: DC
  Administered 2015-04-20 – 2015-04-23 (×4): 20 [IU] via SUBCUTANEOUS
  Filled 2015-04-19 (×5): qty 0.2

## 2015-04-19 MED ORDER — ACETAMINOPHEN 650 MG RE SUPP: 325.0000 mg | RECTAL | Status: DC | PRN

## 2015-04-19 MED ORDER — LIDOCAINE HCL (PF) 1 % IJ SOLN
INTRAMUSCULAR | Status: AC
  Filled 2015-04-19: qty 30

## 2015-04-19 MED ORDER — LIDOCAINE HCL (CARDIAC) 20 MG/ML IV SOLN
INTRAVENOUS | Status: DC | PRN
  Administered 2015-04-19: 100 mg via INTRAVENOUS

## 2015-04-19 MED ORDER — SIMVASTATIN 20 MG PO TABS
20.0000 mg | ORAL_TABLET | Freq: Every day | ORAL | Status: DC
  Administered 2015-04-19 – 2015-04-23 (×5): 20 mg via ORAL
  Filled 2015-04-19 (×6): qty 1

## 2015-04-19 MED ORDER — PANTOPRAZOLE SODIUM 40 MG PO TBEC
40.0000 mg | DELAYED_RELEASE_TABLET | Freq: Every day | ORAL | Status: DC
  Administered 2015-04-19 – 2015-04-23 (×5): 40 mg via ORAL
  Filled 2015-04-19 (×4): qty 1

## 2015-04-19 MED ORDER — PROPOFOL 10 MG/ML IV BOLUS
INTRAVENOUS | Status: AC
  Filled 2015-04-19: qty 20

## 2015-04-19 SURGICAL SUPPLY — 48 items
ARMBAND PINK RESTRICT EXTREMIT (MISCELLANEOUS) ×3 IMPLANT
BANDAGE ELASTIC 4 VELCRO ST LF (GAUZE/BANDAGES/DRESSINGS) ×2 IMPLANT
BNDG GAUZE ELAST 4 BULKY (GAUZE/BANDAGES/DRESSINGS) ×2 IMPLANT
CANISTER SUCTION 2500CC (MISCELLANEOUS) ×3 IMPLANT
CATH EMB 4FR 40CM (CATHETERS) ×2 IMPLANT
CLIP TI MEDIUM 6 (CLIP) ×3 IMPLANT
CLIP TI WIDE RED SMALL 6 (CLIP) ×3 IMPLANT
CONT SPEC 4OZ CLIKSEAL STRL BL (MISCELLANEOUS) ×2 IMPLANT
COVER PROBE W GEL 5X96 (DRAPES) ×1 IMPLANT
DECANTER SPIKE VIAL GLASS SM (MISCELLANEOUS) ×3 IMPLANT
DRSG PAD ABDOMINAL 8X10 ST (GAUZE/BANDAGES/DRESSINGS) ×2 IMPLANT
ELECT REM PT RETURN 9FT ADLT (ELECTROSURGICAL) ×3
ELECTRODE REM PT RTRN 9FT ADLT (ELECTROSURGICAL) ×1 IMPLANT
GLOVE BIO SURGEON STRL SZ 6.5 (GLOVE) ×3 IMPLANT
GLOVE BIO SURGEON STRL SZ7 (GLOVE) ×3 IMPLANT
GLOVE BIO SURGEONS STRL SZ 6.5 (GLOVE) ×3
GLOVE BIOGEL PI IND STRL 6.5 (GLOVE) IMPLANT
GLOVE BIOGEL PI IND STRL 7.5 (GLOVE) ×1 IMPLANT
GLOVE BIOGEL PI INDICATOR 6.5 (GLOVE) ×4
GLOVE BIOGEL PI INDICATOR 7.5 (GLOVE) ×2
GOWN STRL REUS W/ TWL LRG LVL3 (GOWN DISPOSABLE) ×3 IMPLANT
GOWN STRL REUS W/TWL LRG LVL3 (GOWN DISPOSABLE) ×9
HANDPIECE INTERPULSE COAX TIP (DISPOSABLE) ×3
KIT BASIN OR (CUSTOM PROCEDURE TRAY) ×3 IMPLANT
KIT ROOM TURNOVER OR (KITS) ×3 IMPLANT
LIQUID BAND (GAUZE/BANDAGES/DRESSINGS) ×3 IMPLANT
NS IRRIG 1000ML POUR BTL (IV SOLUTION) ×3 IMPLANT
PACK CV ACCESS (CUSTOM PROCEDURE TRAY) ×3 IMPLANT
PAD ABD 8X10 STRL (GAUZE/BANDAGES/DRESSINGS) ×2 IMPLANT
PAD ARMBOARD 7.5X6 YLW CONV (MISCELLANEOUS) ×6 IMPLANT
SET HNDPC FAN SPRY TIP SCT (DISPOSABLE) IMPLANT
SPONGE GAUZE 4X4 12PLY STER LF (GAUZE/BANDAGES/DRESSINGS) ×2 IMPLANT
SPONGE SURGIFOAM ABS GEL 100 (HEMOSTASIS) IMPLANT
STAPLER VISISTAT 35W (STAPLE) ×2 IMPLANT
STOPCOCK 4 WAY LG BORE MALE ST (IV SETS) ×2 IMPLANT
SUT ETHILON 3 0 PS 1 (SUTURE) ×4 IMPLANT
SUT MNCRL AB 4-0 PS2 18 (SUTURE) ×3 IMPLANT
SUT PROLENE 3 0 SH 48 (SUTURE) ×2 IMPLANT
SUT PROLENE 5 0 C 1 24 (SUTURE) ×2 IMPLANT
SUT PROLENE 6 0 BV (SUTURE) ×4 IMPLANT
SUT PROLENE 7 0 BV 1 (SUTURE) IMPLANT
SUT SILK 2 0 FS (SUTURE) ×1 IMPLANT
SUT VIC AB 3-0 SH 27 (SUTURE) ×3
SUT VIC AB 3-0 SH 27X BRD (SUTURE) ×2 IMPLANT
TAPE PAPER 1X10 WHT MICROPORE (GAUZE/BANDAGES/DRESSINGS) ×2 IMPLANT
TUBE ANAEROBIC SPECIMEN COL (MISCELLANEOUS) ×2 IMPLANT
UNDERPAD 30X30 INCONTINENT (UNDERPADS AND DIAPERS) ×3 IMPLANT
WATER STERILE IRR 1000ML POUR (IV SOLUTION) ×3 IMPLANT

## 2015-04-19 NOTE — Progress Notes (Signed)
ANTIBIOTIC CONSULT NOTE - INITIAL  Pharmacy Consult for Vanco/Zosyn Indication: Infected HD graft  No Known Allergies  Patient Measurements: Height: 5\' 9"  (175.3 cm) Weight: 155 lb 12.8 oz (70.67 kg) IBW/kg (Calculated) : 70.7 Adjusted Body Weight:    Vital Signs: Temp: 97.8 F (36.6 C) (07/21 2049) Temp Source: Oral (07/21 2049) BP: 200/80 mmHg (07/21 2049) Pulse Rate: 77 (07/21 2049) Intake/Output from previous day:   Intake/Output from this shift: Total I/O In: 325 [I.V.:325] Out: 0   Labs:  Recent Labs  04/19/15 1601  WBC 11.1*  HGB 9.4*  PLT 375  CREATININE 8.59*   Estimated Creatinine Clearance: 7.3 mL/min (by C-G formula based on Cr of 8.59). No results for input(s): VANCOTROUGH, VANCOPEAK, VANCORANDOM, GENTTROUGH, GENTPEAK, GENTRANDOM, TOBRATROUGH, TOBRAPEAK, TOBRARND, AMIKACINPEAK, AMIKACINTROU, AMIKACIN in the last 72 hours.   Microbiology: Recent Results (from the past 720 hour(s))  Blood culture (routine x 2)     Status: None (Preliminary result)   Collection Time: 04/19/15  4:00 PM  Result Value Ref Range Status   Specimen Description BLOOD RIGHT ANTECUBITAL  Final   Special Requests BOTTLES DRAWN AEROBIC AND ANAEROBIC 5CC  Final   Culture PENDING  Incomplete   Report Status PENDING  Incomplete    Medical History: Past Medical History  Diagnosis Date  . Hypertension   . Hyperlipidemia   . Chronic kidney disease     dialysis Tues, Thur, Sat  . Diabetes mellitus   . Cancer     Prostate cancer treated with radiation therapy  . Thyroid disease     hyperparathyroidism  . Anemia   . Positive PPD   . Clot 05/2006    Arterial clot right leg   . Hemodialysis patient 6/13  . Diverticulosis     Medications:  Prescriptions prior to admission  Medication Sig Dispense Refill Last Dose  . aspirin 81 MG tablet Take 81-162 mg by mouth 2 (two) times daily.    04/19/2015 at Unknown time  . buPROPion (WELLBUTRIN XL) 150 MG 24 hr tablet Take 150 mg by  mouth daily.   04/19/2015 at Unknown time  . calcium acetate (PHOSLO) 667 MG capsule Take 2,001 mg by mouth 2 (two) times daily.    04/19/2015 at Unknown time  . cloNIDine (CATAPRES) 0.2 MG tablet Take 0.2 mg by mouth 3 (three) times daily.    04/19/2015 at Unknown time  . doxazosin (CARDURA) 8 MG tablet Take 8 mg by mouth at bedtime.    04/18/2015 at Unknown time  . furosemide (LASIX) 40 MG tablet Take 40 mg by mouth 2 (two) times daily.     04/19/2015 at Unknown time  . gabapentin (NEURONTIN) 100 MG capsule Take 100 mg by mouth 2 (two) times daily.   04/19/2015 at Unknown time  . insulin detemir (LEVEMIR) 100 UNIT/ML injection Inject 20 Units into the skin at bedtime.    04/18/2015 at Unknown time  . Linaclotide (LINZESS) 145 MCG CAPS capsule Take 1 capsule (145 mcg total) by mouth daily. Take 30 minutes before breakfast 30 capsule 11 04/19/2015 at Unknown time  . QUEtiapine (SEROQUEL) 100 MG tablet Take 100 mg by mouth at bedtime.   04/18/2015 at Unknown time  . saxagliptin HCl (ONGLYZA) 5 MG TABS tablet Take 5 mg by mouth daily.   04/19/2015 at Unknown time  . simvastatin (ZOCOR) 20 MG tablet Take 20 mg by mouth at bedtime.     04/18/2015 at Unknown time  . Vitamin D, Ergocalciferol, (DRISDOL) 50000 UNITS  CAPS Take 50,000 Units by mouth every 7 (seven) days. mondays   04/16/2015   Assessment: 2 week h/o redness and purulent drainage to old AVF to R upper arm. 7/21 PM: excision of R upper arm graft. 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  Goal of Therapy:  Vanco level <20 post-HD prior to redosing  Plan:  Zosyn 2.25g IV q8hr Vancomycin 1500mg  IV x 1 then 750mg  IV MWF  Rondall Radigan S. Alford Highland, PharmD, BCPS Clinical Staff Pharmacist Pager [phone]  Eilene Ghazi Stillinger 04/19/2015,9:08 PM

## 2015-04-19 NOTE — ED Provider Notes (Signed)
CSN: [id number]     Arrival date & time 04/19/15  1356 History   None    Chief Complaint  Patient presents with  . Vascular Access Problem     (Consider location/radiation/quality/duration/timing/severity/associated sxs/prior Treatment) Patient is a 74 y.o. male presenting with abscess. The history is provided by the patient. No language interpreter was used.  Abscess Location:  Shoulder/arm Shoulder/arm abscess location:  R upper arm Size:  4 mm Abscess quality: draining and painful   Red streaking: no   Progression:  Worsening Pain details:    Quality:  Aching   Severity:  Moderate   Timing:  Constant   Progression:  Worsening Chronicity:  New Context comment:  Over nonfunctioning AV fistula  Relieved by:  Nothing Worsened by:  Nothing tried Ineffective treatments:  Draining/squeezing Associated symptoms: no fatigue, no fever, no headaches, no nausea and no vomiting     Past Medical History  Diagnosis Date  . Hypertension   . Hyperlipidemia   . Chronic kidney disease     dialysis Tues, Thur, Sat  . Diabetes mellitus   . Cancer     Prostate cancer treated with radiation therapy  . Thyroid disease     hyperparathyroidism  . Anemia   . Positive PPD   . Clot 05/2006    Arterial clot right leg   . Hemodialysis patient 6/13  . Diverticulosis    Past Surgical History  Procedure Laterality Date  . 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 Av fistula placement  11/04/2010    left and left  . Av fistula placement  11/20/2011    Procedure: INSERTION OF ARTERIOVENOUS (AV) GORE-TEX GRAFT ARM;  Surgeon: Angelia Mould, MD;  Location: [facility] OR;  Service: Vascular;  Laterality: Right;  . Colonoscopy N/A 11/10/2012    OAC:[id number] proctitis/Pancolonic diverticulosis/(TUBULAR ADENOMA)5 mm polyp in the transverse segment/4 mm polyps in the base of the cecum and diminutive polyp in the descending segment. INADEQUATE BOWEL PREP  . Colonoscopy N/A 11/28/2013    RMR:  Radiation proctitis. Colonic diverticulosis. Colonic polyps-removed as described above.   [name] N/A 10/08/2012    Procedure: [name];  Surgeon: Elam Dutch, MD;  Location: [facility] CATH LAB;  Service: Cardiovascular;  Laterality: N/A;   Family History  Problem Relation Age of Onset  . Heart disease Mother   . Diabetes Mother   . Hypertension Mother   . Stroke Mother   . Heart disease Father   . Diabetes Father   . Heart disease Sister   . Diabetes Brother   . Colon cancer Neg Hx    History  Substance Use Topics  . Smoking status: Current Every Day Smoker -- 1.00 packs/day for 50 years    Types: Cigarettes  . Smokeless tobacco: Not on file  . Alcohol Use: No    Review of Systems  Constitutional: Negative for fever, diaphoresis and fatigue.  Respiratory: Negative for chest tightness and shortness of breath.   Cardiovascular: Negative for chest pain.  Gastrointestinal: Negative for nausea, vomiting and abdominal pain.  Skin: Positive for wound.  Neurological: Negative for light-headedness and headaches.  Psychiatric/Behavioral: Negative for confusion.  All other systems reviewed and are negative.     Allergies  Review of patient's allergies indicates no known allergies.  Home Medications   Prior to Admission medications   Medication Sig Start Date End Date Taking? Authorizing Provider  aspirin 81 MG tablet Take 81-162 mg by mouth 2 (two) times  daily.     Historical Provider, MD  buPROPion (WELLBUTRIN SR) 150 MG 12 hr tablet Take 150 mg by mouth daily.  08/21/12   Historical Provider, MD  calcium acetate (PHOSLO) 667 MG capsule Take 2,001 mg by mouth 3 (three) times daily.  10/26/12   Historical Provider, MD  cloNIDine (CATAPRES) 0.2 MG tablet Take 0.2 mg by mouth 3 (three) times daily.     Historical Provider, MD  doxazosin (CARDURA) 8 MG tablet Take 8 mg by mouth at bedtime.     Historical Provider, MD  furosemide (LASIX) 40 MG tablet Take 40 mg by mouth 2 (two)  times daily.      Historical Provider, MD  insulin detemir (LEVEMIR) 100 UNIT/ML injection Inject 8-20 Units into the skin at bedtime.     Historical Provider, MD  Linaclotide Rolan Lipa) 145 MCG CAPS capsule Take 1 capsule (145 mcg total) by mouth daily. Take 30 minutes before breakfast 02/06/15   Carlis Stable, NP  saxagliptin HCl (ONGLYZA) 5 MG TABS tablet Take 5 mg by mouth daily.    Historical Provider, MD  simvastatin (ZOCOR) 20 MG tablet Take 20 mg by mouth at bedtime.      Historical Provider, MD  Vitamin D, Ergocalciferol, (DRISDOL) 50000 UNITS CAPS Take 50,000 Units by mouth every 7 (seven) days. mondays    Historical Provider, MD   BP 174/58 mmHg  Pulse 79  Temp(Src) 97.5 F (36.4 C) (Oral)  Resp 20  SpO2 96% Physical Exam  Constitutional: He is oriented to person, place, and time. He appears well-developed and well-nourished. No distress.  HENT:  Head: Normocephalic and atraumatic.  Nose: Nose normal.  Mouth/Throat: Oropharynx is clear and moist. No oropharyngeal exudate.  Eyes: EOM are normal. Pupils are equal, round, and reactive to light.  Neck: Normal range of motion. Neck supple.  Cardiovascular: Normal rate, regular rhythm, normal heart sounds and intact distal pulses.   No murmur heard. Pulmonary/Chest: Effort normal and breath sounds normal. No respiratory distress. He has no wheezes. He exhibits no tenderness.  Abdominal: Soft. He exhibits no distension. There is no tenderness. There is no guarding.  Musculoskeletal: Normal range of motion. He exhibits no tenderness.  Neurological: He is alert and oriented to person, place, and time. No cranial nerve deficit. Coordination normal.  Skin: Skin is warm and dry. He is not diaphoretic. No pallor.  Proximal RUE AVF on bicep that has no palpable thrill.  No overlying erythema or warmth but + tenderness that is over the graft and extends medially to the mid axillary line. 3 mm lesion on the proximal 1/3 of the graft that is  actively draining serosanguinous fluid.  No purulence able to be expressed.  No underlying fluctuance palpable.    Psychiatric: He has a normal mood and affect. His behavior is normal. Judgment and thought content normal.  Nursing note and vitals reviewed.   ED Course  Procedures (including critical care time) Labs Review Labs Reviewed - No data to display  Imaging Review No results found.   EKG Interpretation None      MDM   Final diagnoses:  AV fistula infection, initial encounter   Pt is a 74 yo M with hx of ESRD, HTN, DM who presents with right arm abscess over his AVF. Has had a failed RUE AVF for 3 weeks, now using vascath.  AVF initially placed by Dr. Scot Dock with vascular surgery.   He presents complaining of 2 weeks of pain at the fistula  area, then was seen by his doctor today and had a superficial abscess I/D'd.  Afebrile.  Denies constitutional sx   Proximal RUE AVF on bicep that has no palpable thrill.  No overlying erythema or warmth but + tenderness that is over the graft and extends medially to the mid axillary line. 3 mm lesion on the proximal 1/3 of the graft that is actively draining serosanguinous fluid.  No purulence able to be expressed.  No underlying fluctuance palpable.    Concerned for AVF infection.  Labs and blood cultures were sent.   Vascular surgery PA evaluated the patient in the ED.   To admit to vascular surgery team, and will plan to take down the AVF.  Patient was stable to move when a bed became available.     If performed, labs, EKGs, and imaging were reviewed and interpreted by myself and my attending, and incorporated in the medical decision making.  Patient was seen with ED Attending, Dr. Alfonse Spruce, MD   Tori Milks, MD 04/20/15 0211  Veryl Speak, MD 04/20/15 [phone]

## 2015-04-19 NOTE — Consult Note (Signed)
[facility] [place]  Reason for Consult:  Infected right arm graft  Referring Physician:  EDP MRN #:  [id number]  History of Present Illness: This is a 74 y.o. male who presents to the [facility] ED with 2 week history of purulent drainage from his right upper arm graft (placed on 11/20/11 by Dr. Scot Dock). This graft was last cannulated three weeks ago but was felt to be clotted. Subsequently the patient went to [facility] for a fistula study and insertion of left neck IJ tunneled dialysis catheter. He has been dialyzing via the catheter (MWF). His primary nephrologist is Dr. Lorrene Reid. He denies any fever or chills, nausea or vomiting.   He has a past medical history of hypertension, hyperlipidemia and diabetes. He is on not on any anticoagulation.   Past Medical History  Diagnosis Date  . Hypertension   . Hyperlipidemia   . Chronic kidney disease     dialysis Tues, Thur, Sat  . Diabetes mellitus   . Cancer     Prostate cancer treated with radiation therapy  . Thyroid disease     hyperparathyroidism  . Anemia   . Positive PPD   . Clot 05/2006    Arterial clot right leg   . Hemodialysis patient 6/13  . Diverticulosis    Past Surgical History  Procedure Laterality Date  . 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 Av fistula placement  11/04/2010    left and left  . Av fistula placement  11/20/2011    Procedure: INSERTION OF ARTERIOVENOUS (AV) GORE-TEX GRAFT ARM;  Surgeon: Angelia Mould, MD;  Location: [facility] OR;  Service: Vascular;  Laterality: Right;  . Colonoscopy N/A 11/10/2012    VVO:[id number] proctitis/Pancolonic diverticulosis/(TUBULAR ADENOMA)5 mm polyp in the transverse segment/4 mm polyps in the base of the cecum and diminutive polyp in the descending segment. INADEQUATE BOWEL PREP  . Colonoscopy N/A 11/28/2013    RMR: Radiation proctitis. Colonic diverticulosis. Colonic polyps-removed as described above.   [name] N/A 10/08/2012    Procedure: [name];  Surgeon: Elam Dutch, MD;  Location: [facility] CATH LAB;  Service: Cardiovascular;  Laterality: N/A;    No Known Allergies  Prior to Admission medications   Medication Sig Start Date End Date Taking? Authorizing Provider  aspirin 81 MG tablet Take 81-162 mg by mouth 2 (two) times daily.    Yes Historical Provider, MD  buPROPion (WELLBUTRIN XL) 150 MG 24 hr tablet Take 150 mg by mouth daily. 03/05/15  Yes Historical Provider, MD  calcium acetate (PHOSLO) 667 MG capsule Take 2,001 mg by mouth 2 (two) times daily.  10/26/12  Yes Historical Provider, MD  cloNIDine (CATAPRES) 0.2 MG tablet Take 0.2 mg by mouth 3 (three) times daily.    Yes Historical Provider, MD  doxazosin (CARDURA) 8 MG tablet Take 8 mg by mouth at bedtime.    Yes Historical Provider, MD  furosemide (LASIX) 40 MG tablet Take 40 mg by mouth 2 (two) times daily.     Yes Historical Provider, MD  gabapentin (NEURONTIN) 100 MG capsule Take 100 mg by mouth 2 (two) times daily. 04/13/15  Yes Historical Provider, MD  insulin detemir (LEVEMIR) 100 UNIT/ML injection Inject 20 Units into the skin at bedtime.    Yes Historical Provider, MD  Linaclotide Rolan Lipa) 145 MCG CAPS capsule Take 1 capsule (145 mcg total) by mouth daily. Take 30 minutes before breakfast 02/06/15  Yes Carlis Stable, NP  QUEtiapine (SEROQUEL)  100 MG tablet Take 100 mg by mouth at bedtime. 03/05/15  Yes Historical Provider, MD  saxagliptin HCl (ONGLYZA) 5 MG TABS tablet Take 5 mg by mouth daily.   Yes Historical Provider, MD  simvastatin (ZOCOR) 20 MG tablet Take 20 mg by mouth at bedtime.     Yes Historical Provider, MD  Vitamin D, Ergocalciferol, (DRISDOL) 50000 UNITS CAPS Take 50,000 Units by mouth every 7 (seven) days. mondays   Yes Historical Provider, MD    History   Social History  . Marital Status: Married    Spouse Name: N/A  . Number of Children: N/A  . Years of Education: N/A   Occupational History  . Not on file.   Social History Main  Topics  . Smoking status: Current Every Day Smoker -- 1.00 packs/day for 50 years    Types: Cigarettes  . Smokeless tobacco: Not on file  . Alcohol Use: No  . Drug Use: No  . Sexual Activity: Not on file   Other Topics Concern  . Not on file   Social History Narrative    Family History  Problem Relation Age of Onset  . Heart disease Mother   . Diabetes Mother   . Hypertension Mother   . Stroke Mother   . Heart disease Father   . Diabetes Father   . Heart disease Sister   . Diabetes Brother   . Colon cancer Neg Hx     ROS: [x]  Positive   [ ]  Negative   [ ]  All sytems reviewed and are negative  Cardiovascular: []  chest pain/pressure []  palpitations []  SOB lying flat []  DOE []  pain in legs while walking []  pain in legs at rest []  pain in legs at night []  non-healing ulcers []  hx of DVT []  swelling in legs  Pulmonary: []  productive cough []  asthma/wheezing []  home O2  Neurologic: []  weakness in []  arms []  legs []  numbness in []  arms []  legs []  hx of CVA []  mini stroke [] difficulty speaking or slurred speech []  temporary loss of vision in one eye []  dizziness  Hematologic: []  hx of cancer []  bleeding problems []  problems with blood clotting easily  Endocrine:   [x]  diabetes []  thyroid disease  GI []  vomiting blood []  blood in stool  GU: [x]  CKD/renal failure [x]  HD--[x]  M/W/F or []  T/T/S []  burning with urination []  blood in urine  Psychiatric: []  anxiety []  depression  Musculoskeletal: []  arthritis []  joint pain  Integumentary: []  rashes []  ulcers  Constitutional: []  fever []  chills   Physical Examination  Filed Vitals:   04/19/15 1402  BP: 174/58  Pulse: 79  Temp: 97.5 F (36.4 C)  Resp: 20   There is no weight on file to calculate BMI.  General:  WDWN in NAD Gait: Not observed HENT: WNL, normocephalic Pulmonary: normal non-labored breathing, without Rales, rhonchi,  wheezing Cardiac: regular, without  Murmurs, rubs or  gallops Vascular Exam/Pulses: right upper arm graft with small pinpoint hole with purulent drainage. Non palpable thrill, non audible bruit.  Extremities: right BKA Musculoskeletal: no muscle wasting or atrophy  Neurologic: A&O X 3; Appropriate Affect ; SENSATION: normal; MOTOR FUNCTION:  moving all extremities equally. Speech is fluent/normal Psychiatric: Judgment intact, Mood & affect appropriate for pt's clinical situation Lymph: R axillary LAD  CBC    Component Value Date/Time   WBC 11.1* 04/19/2015 1601   RBC 2.85* 04/19/2015 1601   HGB 9.4* 04/19/2015 1601   HCT 29.6* 04/19/2015 1601  PLT 375 04/19/2015 1601   MCV 103.9* 04/19/2015 1601   MCH 33.0 04/19/2015 1601   MCHC 31.8 04/19/2015 1601   RDW 18.6* 04/19/2015 1601   LYMPHSABS 1.3 04/19/2015 1601   MONOABS 0.6 04/19/2015 1601   EOSABS 0.2 04/19/2015 1601   BASOSABS 0.0 04/19/2015 1601    BMET    Component Value Date/Time   NA 135 02/06/2015 1206   K 4.8 02/06/2015 1206   CL 94* 02/06/2015 1206   CO2 26 02/06/2015 1206   GLUCOSE 281* 02/06/2015 1206   BUN 41* 02/06/2015 1206   CREATININE 9.19* 02/06/2015 1206   CREATININE 4.19* 01/12/2014 1238   CALCIUM 8.5 02/06/2015 1206   GFRNONAA 13* 01/12/2014 1238   GFRAA 15* 01/12/2014 1238    Statin:  Yes.   Beta Blocker:  No. Aspirin:  Yes.   ACEI:  No. ARB:  No. Other antiplatelets/anticoagulants:  No.   ASSESSMENT/PLAN: This is a 74 y.o. male with infected right upper arm graft. Will take to OR today for removal of graft. His last po intake was at 1000 today. He is on HD (MWF) via left IJ tunneled dialysis catheter. Will consult nephrology in am for dialysis management. Keep NPO. Obtain consent.    Virgina Jock, PA-C [facility] Office: [phone] Pager: [phone]  Addendum  I have independently interviewed and examined the patient, and I agree with the physician assistant's findings.  Seropurulent fluid is draining spontaneously  from a hole leading to the graft.  RUA AVG needs to be removed.  Patient will be admitted post-op for wound care, pain control, and determination of abx regimen once C&S come back from the graft tract.  Adele Barthel, MD [facility] [place] Office: [phone] Pager: [phone]  04/19/2015, 5:30 PM

## 2015-04-19 NOTE — ED Notes (Signed)
Pt presents with 2 week h/o redness and drainage to old AVF to R upper arm, reports it was last used x 3 weeks ago  (now has vascath)

## 2015-04-19 NOTE — Anesthesia Postprocedure Evaluation (Signed)
  Anesthesia Post-op Note  Patient: Gerald Davenport  Procedure(s) Performed: Procedure(s): Excision of infected RUA AVG (Right)  Patient Location: PACU  Anesthesia Type:General  Level of Consciousness: awake, alert  and oriented  Airway and Oxygen Therapy: Patient Spontanous Breathing  Post-op Pain: mild  Post-op Assessment: Post-op Vital signs reviewed, Patient's Cardiovascular Status Stable, Respiratory Function Stable, Patent Airway and Pain level controlled              Post-op Vital Signs: stable  Last Vitals:  Filed Vitals:   04/19/15 2140  BP: 192/64  Pulse: 78  Temp:   Resp:     Complications: No apparent anesthesia complications

## 2015-04-19 NOTE — Op Note (Signed)
OPERATIVE NOTE   PROCEDURE: 1. Excision of right upper arm graft  PRE-OPERATIVE DIAGNOSIS: Infected right upper arm graft  POST-OPERATIVE DIAGNOSIS: same as above , infected Viabahn stent  SURGEON: Adele Barthel, MD  ASSISTANT(S): Leontine Locket, PAC   ANESTHESIA: general  ESTIMATED BLOOD LOSS: 50 cc  FINDING(S): 1.  Stent erosion through skin 2.  Phlegmon in axilla surrounding infected Viabahn stented graft segment   SPECIMEN(S):  Infected graft segment, aerobic culture of phlegmon  INDICATIONS:   Gerald Davenport is a 74 y.o. male who presents with frank pus draining from a hole in his skin overlying a right upper arm graft.  I discussed with him: removal of right upper arm graft.  Risk include but are not limited: bleeding, infection, nerve damage, retained foreign material, and need for additional procedure.  The patient is aware and has elected to proceed.  DESCRIPTION: After obtaining full informed written consent, the patient was brought back to the operating room and placed supine upon the operating table.  The patient received IV antibiotics prior to induction.  After obtaining adequate anesthesia, the patient was prepped and draped in the standard fashion for: right arm access procedure.  I made an incision over the arterial anastomosis and dissected out the graft down to the arterial anastomosis.  The graft appeared well incorporated here.  I dissected out another 5 cm of the graft from this incision.  I then made an incision over the venous anastomosis.  I dissected out the graft with electrocautery.  In this process, I dissected out a phlegmon surrounding the venous anastomosis.  I entered the phlegmon and there was frank pus in this axilla.  I took an aerobic culture of this abscess cavity.  The venous anastomosis was already disrupted anteriorly.  I transected the graft in this exposure to facilitate dissection of the anastomosis.  Immediately, it became evident that there  was infected Viabahn stent within this segment of the graft.  I extracted 2 Viabahn stents and 1 bare metal stent.  The posterior wall of the axillary vein appeared to thickened and inflamed.  The anterior wall of the vein appeared necrotic.  There was no active bleeding from the vein.  Using a 3-0 Prolene, I took large bites of the inflamed vein wall to anterior tissue that also appeared inflamed to oversew the axillary vein.    At this point, I made a curvilinear incision over the two cannulation site overlying the graft.  I became readily evident that the proximal eroded skin was due to a the bare metal stent.  I dissected out the central 1/2 of the graft with electrocautery.  The proximal segment appeared necrotic.  I was able to bluntly dissect out the graft segment extending into the axilla.  The distal segment was densely incorporated.  Using electrocautery, I dissected out the entire graft down to the arterial anastomosis.  I transected the graft distally, leaving a 1-2 cm cuff of graft on the brachial artery to avoid compromising lumen.  This was oversewed with a double layer of 5-0 Prolene.    I washed out all incisions with a Pulsavac and 3 L of sterile normal saline.  I then packed thrombin and gelfoam in all incisions.  All incisions were washed out.  No active bleeding was present.  I first closed the tract between the arterial exposure and the mid-segment exposure with a running stitch of 3-0 Vicryl.  The subcutaneous tissue in the distal incisions was reapproximated  with a running stitch of 3-0 Vicryl.  The skin was reapproximated at this incision with staples.  I then turned my attention to the axillary exposure.  There was no active bleeding.  I reapproximated the subcutaneous tissue with a running stitch of 3-0 Vicryl.  Again, I reapproximated the skin with staples.  I then obtained a wet sterile Kerlix and packed the wound from the axilla through the open mid-segment incision.  I placed ABD  pad around the incision.  This was held in place with a Kerlix.  An ACE bandage was gently wrapped around the entire arm.     COMPLICATIONS: none  CONDITION: stable   Adele Barthel, MD [facility] [place] Office: [phone] Pager: [phone]  04/19/2015, 7:43 PM

## 2015-04-19 NOTE — Anesthesia Preprocedure Evaluation (Addendum)
Anesthesia Evaluation  Patient identified by MRN, date of birth, ID band Patient awake    Reviewed: Allergy & Precautions, NPO status , Patient's Chart, lab work & pertinent test results  Airway Mallampati: II       Dental  (+) Edentulous Upper, Edentulous Lower, Dental Advisory Given   Pulmonary Current Smoker,    Pulmonary exam normal + decreased breath sounds      Cardiovascular hypertension, Normal cardiovascular exam    Neuro/Psych    GI/Hepatic GERD-  ,  Endo/Other  diabetes, Type 2, Insulin Dependent  Renal/GU ESRF and DialysisRenal disease     Musculoskeletal   Abdominal   Peds  Hematology  (+) anemia ,   Anesthesia Other Findings   Reproductive/Obstetrics                            Anesthesia Physical Anesthesia Plan  ASA: III  Anesthesia Plan: General   Post-op Pain Management:    Induction: Intravenous  Airway Management Planned: Oral ETT  Additional Equipment:   Intra-op Plan:   Post-operative Plan: Extubation in OR  Informed Consent: I have reviewed the patients History and Physical, chart, labs and discussed the procedure including the risks, benefits and alternatives for the proposed anesthesia with the patient or authorized representative who has indicated his/her understanding and acceptance.     Plan Discussed with: CRNA, Anesthesiologist and Surgeon  Anesthesia Plan Comments:         Anesthesia Quick Evaluation

## 2015-04-19 NOTE — Transfer of Care (Signed)
Immediate Anesthesia Transfer of Care Note  Patient: Gerald Davenport  Procedure(s) Performed: Procedure(s): Excision of infected RUA AVG (Right)  Patient Location: PACU  Anesthesia Type:General  Level of Consciousness: awake, alert , oriented and patient cooperative  Airway & Oxygen Therapy: Patient Spontanous Breathing and Patient connected to nasal cannula oxygen  Post-op Assessment: Report given to RN and Post -op Vital signs reviewed and stable  Post vital signs: Reviewed and stable  Last Vitals:  Filed Vitals:   04/19/15 2000  BP:   Pulse: 73  Temp:   Resp: 18    Complications: No apparent anesthesia complications

## 2015-04-19 NOTE — Anesthesia Procedure Notes (Signed)
Procedure Name: Intubation Date/Time: 04/19/2015 6:11 PM Performed by: Merdis Delay Pre-anesthesia Checklist: Patient identified, Timeout performed, Emergency Drugs available, Suction available and Patient being monitored Patient Re-evaluated:Patient Re-evaluated prior to inductionOxygen Delivery Method: Circle system utilized Preoxygenation: Pre-oxygenation with 100% oxygen Intubation Type: IV induction Laryngoscope Size: Mac and 3 Grade View: Grade I Tube type: Oral Tube size: 7.5 mm Number of attempts: 1 Airway Equipment and Method: Stylet and LTA kit utilized Placement Confirmation: ETT inserted through vocal cords under direct vision,  breath sounds checked- equal and bilateral,  positive ETCO2 and CO2 detector Secured at: 22 cm Tube secured with: Tape Dental Injury: Teeth and Oropharynx as per pre-operative assessment

## 2015-04-20 ENCOUNTER — Encounter (HOSPITAL_COMMUNITY): Payer: Self-pay | Admitting: General Practice

## 2015-04-20 LAB — GLUCOSE, CAPILLARY
GLUCOSE-CAPILLARY: 268 mg/dL — AB (ref 65–99)
Glucose-Capillary: 216 mg/dL — ABNORMAL HIGH (ref 65–99)
Glucose-Capillary: 157 mg/dL — ABNORMAL HIGH (ref 65–99)
Glucose-Capillary: 188 mg/dL — ABNORMAL HIGH (ref 65–99)

## 2015-04-20 LAB — CBC
HCT: 30.1 % — ABNORMAL LOW (ref 39.0–52.0)
Hemoglobin: 9.6 g/dL — ABNORMAL LOW (ref 13.0–17.0)
MCH: 33.1 pg (ref 26.0–34.0)
MCHC: 31.9 g/dL (ref 30.0–36.0)
MCV: 103.8 fL — ABNORMAL HIGH (ref 78.0–100.0)
Platelets: 253 10*3/uL (ref 150–400)
RBC: 2.9 MIL/uL — AB (ref 4.22–5.81)
RDW: 18.8 % — AB (ref 11.5–15.5)
WBC: 10 10*3/uL (ref 4.0–10.5)

## 2015-04-20 LAB — BASIC METABOLIC PANEL
ANION GAP: 13 (ref 5–15)
BUN: 37 mg/dL — AB (ref 6–20)
CHLORIDE: 91 mmol/L — AB (ref 101–111)
CO2: 25 mmol/L (ref 22–32)
Calcium: 7.8 mg/dL — ABNORMAL LOW (ref 8.9–10.3)
Creatinine, Ser: 9.55 mg/dL — ABNORMAL HIGH (ref 0.61–1.24)
GFR calc Af Amer: 5 mL/min — ABNORMAL LOW (ref >60)
GFR calc non Af Amer: 5 mL/min — ABNORMAL LOW (ref >60)
Glucose, Bld: 213 mg/dL — ABNORMAL HIGH (ref 65–99)
POTASSIUM: 5.2 mmol/L — AB (ref 3.5–5.1)
SODIUM: 129 mmol/L — AB (ref 135–145)

## 2015-04-20 MED ORDER — NEPRO/CARBSTEADY PO LIQD
237.0000 mL | Freq: Two times a day (BID) | ORAL | Status: DC
  Administered 2015-04-20 – 2015-04-24 (×6): 237 mL via ORAL
  Filled 2015-04-20 (×12): qty 237

## 2015-04-20 MED ORDER — NEPRO/CARBSTEADY PO LIQD
237.0000 mL | ORAL | Status: DC | PRN
  Filled 2015-04-20: qty 237

## 2015-04-20 MED ORDER — VANCOMYCIN HCL 500 MG IV SOLR
500.0000 mg | Freq: Once | INTRAVENOUS | Status: DC
  Filled 2015-04-20: qty 500

## 2015-04-20 MED ORDER — INSULIN ASPART 100 UNIT/ML ~~LOC~~ SOLN
0.0000 [IU] | Freq: Three times a day (TID) | SUBCUTANEOUS | Status: DC
  Administered 2015-04-20: 8 [IU] via SUBCUTANEOUS
  Administered 2015-04-21: 3 [IU] via SUBCUTANEOUS
  Administered 2015-04-21: 2 [IU] via SUBCUTANEOUS
  Administered 2015-04-22: 8 [IU] via SUBCUTANEOUS
  Administered 2015-04-22: 3 [IU] via SUBCUTANEOUS
  Administered 2015-04-23: 2 [IU] via SUBCUTANEOUS
  Administered 2015-04-23: 3 [IU] via SUBCUTANEOUS
  Administered 2015-04-23 – 2015-04-24 (×3): 8 [IU] via SUBCUTANEOUS

## 2015-04-20 MED ORDER — LIDOCAINE-PRILOCAINE 2.5-2.5 % EX CREA: 1.0000 "application " | TOPICAL_CREAM | CUTANEOUS | Status: DC | PRN

## 2015-04-20 MED ORDER — ALTEPLASE 2 MG IJ SOLR
2.0000 mg | Freq: Once | INTRAMUSCULAR | Status: DC | PRN
  Filled 2015-04-20: qty 2

## 2015-04-20 MED ORDER — PENTAFLUOROPROP-TETRAFLUOROETH EX AERO: 1.0000 "application " | INHALATION_SPRAY | CUTANEOUS | Status: DC | PRN

## 2015-04-20 MED ORDER — HEPARIN SODIUM (PORCINE) 1000 UNIT/ML DIALYSIS
1000.0000 [IU] | INTRAMUSCULAR | Status: DC | PRN
  Filled 2015-04-20: qty 1

## 2015-04-20 MED ORDER — LIDOCAINE HCL (PF) 1 % IJ SOLN: 5.0000 mL | INTRAMUSCULAR | Status: DC | PRN

## 2015-04-20 MED ORDER — SODIUM CHLORIDE 0.9 % IV SOLN: 100.0000 mL | INTRAVENOUS | Status: DC | PRN

## 2015-04-20 MED ORDER — CALCIUM ACETATE (PHOS BINDER) 667 MG PO CAPS
1334.0000 mg | ORAL_CAPSULE | Freq: Two times a day (BID) | ORAL | Status: DC
  Administered 2015-04-21 – 2015-04-24 (×5): 1334 mg via ORAL
  Filled 2015-04-20 (×10): qty 2

## 2015-04-20 NOTE — Progress Notes (Signed)
   Daily Progress Note  Assessment/Planning: POD #1 s/p Exc infected RUA AVG and infected Viabahn stents   Cont wide spectrum Abx: Vanc, Zosyn until C&S available  Pt had a phlegmon in R axilla that resulted in necrosis of the R axillary vein.    Fortunately, there was no back bleeding when I pulled out the Viabahn stents x 2 from the graft and axillary vein, but the vein closure is suspect due the presence of necrotic tissue.   No evidence of motor or sensation loss in R arm.  Wound care to start tomorrow AM, allowing 24-36 hours of packing for control of bleeding.  Plan is wet-to-dry dressing over the weekend and then [facility] dressing starting Monday   Subjective  - 1 Day Post-Op  Pain ok, no numbness or weakness  Objective Filed Vitals:   04/19/15 2140 04/19/15 2259 04/20/15 0258 04/20/15 0540  BP: 192/64 139/58 134/49 142/52  Pulse: 78 74 65 79  Temp:   98.9 F (37.2 C) 99.1 F (37.3 C)  TempSrc:   Oral Oral  Resp:   16 17  Height:      Weight:      SpO2:   95% 100%    Intake/Output Summary (Last 24 hours) at 04/20/15 0914 Last data filed at 04/19/15 2000  Gross per 24 hour  Intake    325 ml  Output      0 ml  Net    325 ml    PULM  CTAB CV  RRR GI  soft, NTND VASC  R arm bandaged, grossly motor and sensation intacted  Laboratory CBC    Component Value Date/Time   WBC 10.0 04/20/2015 0410   HGB 9.6* 04/20/2015 0410   HCT 30.1* 04/20/2015 0410   PLT 253 04/20/2015 0410    BMET    Component Value Date/Time   NA 129* 04/20/2015 0410   K 5.2* 04/20/2015 0410   CL 91* 04/20/2015 0410   CO2 25 04/20/2015 0410   GLUCOSE 213* 04/20/2015 0410   BUN 37* 04/20/2015 0410   CREATININE 9.55* 04/20/2015 0410   CREATININE 9.19* 02/06/2015 1206   CALCIUM 7.8* 04/20/2015 0410   GFRNONAA 5* 04/20/2015 0410   GFRAA 5* 04/20/2015 0410    Adele Barthel, MD [facility] [place]: [phone] Pager: [phone]  04/20/2015, 9:14  AM

## 2015-04-20 NOTE — Progress Notes (Signed)
Inpatient Diabetes Program Recommendations  AACE/ADA: New Consensus Statement on Inpatient Glycemic Control (2013)  Target Ranges:  Prepandial:   less than 140 mg/dL      Peak postprandial:   less than 180 mg/dL (1-2 hours)      Critically ill patients:  140 - 180 mg/dL   Results for RICHIE, BONANNO (MRN [id number]) as of 04/20/2015 09:01  Ref. Range 04/19/2015 17:43 04/19/2015 19:58 04/19/2015 22:39  Glucose-Capillary Latest Ref Range: 65-99 mg/dL 262 (H) 200 (H) 183 (H)   Results for OTTIE, TILLERY (MRN [id number]) as of 04/20/2015 09:01  Ref. Range 04/20/2015 06:15  Glucose-Capillary Latest Ref Range: 65-99 mg/dL 216 (H)    Admit with: Infected AVG  History: DM, HTN, ESRD  Home DM Meds: Levemir 20 units QHS       Onglyza 5 mg daily  Current DM Orders: Levemir 20 units QHS            Tradjenta 5 mg daily    -Infected AVG removed last PM by Vascular.  -Glucose 216 mg/dl this AM.  -Note Levemir to start tonight.    MD- Please start Novolog Sensitive SSI (0-9 units) TID AC + HS     Will follow Wyn Quaker RN, MSN, CDE Diabetes Coordinator Inpatient Glycemic Control Team Team Pager: [phone] (8a-5p)

## 2015-04-20 NOTE — Procedures (Signed)
I saw the patient and agree with the above assessment and plan.    TDC.  No co.  Doing well. RS

## 2015-04-20 NOTE — Progress Notes (Addendum)
Initial Nutrition Assessment  DOCUMENTATION CODES:   Not applicable  INTERVENTION:   Nepro Shake po BID, each supplement provides 425 kcal and 19 grams protein  NUTRITION DIAGNOSIS:   Increased nutrient needs related to chronic illness as evidenced by estimated needs  GOAL:   Patient will meet greater than or equal to 90% of their needs  MONITOR:   PO intake, Supplement acceptance, Labs, Weight trends, Skin, I & O's  REASON FOR ASSESSMENT:   Consult Assessment of nutrition requirement/status  ASSESSMENT:   [name] with ESRD on HD who presented with frank pus draining from a hole in his skin overlying a right upper arm graft.   Patient s/p procedure 7/21: EXCISION OF R UPPER ARM GRAFT  Patient reports his appetite is good.  PO intake 50% per flowsheet records.  Nutrient needs increased given ESRD on HD & infection.  Pt and family amenable to oral nutrition supplements.  Nepro Shake orders in place.  RD unable to complete Nutrition Focused Physical Exam at this time.  Albumin has a half-life of 21 days and is strongly affected by stress response and inflammatory process, therefore, do not expect to see an improvement in this lab value during acute hospitalization.  Diet Order:  Diet renal with fluid restriction Fluid restriction:: 1200 mL Fluid; Room service appropriate?: Yes; Fluid consistency:: Thin  Skin:  Reviewed, no issues  Last BM:  Unknown  Height:   Ht Readings from Last 1 Encounters:  04/19/15 5\' 9"  (1.753 m)    Weight:   Wt Readings from Last 1 Encounters:  04/19/15 155 lb 12.8 oz (70.67 kg)    Ideal Body Weight:  73 kg  Wt Readings from Last 10 Encounters:  04/19/15 155 lb 12.8 oz (70.67 kg)  02/06/15 167 lb (75.751 kg)  04/12/14 162 lb (73.483 kg)  03/24/14 162 lb 12.8 oz (73.846 kg)  01/12/14 172 lb (78.019 kg)  11/28/13 171 lb (77.565 kg)  11/07/13 171 lb (77.565 kg)  06/15/13 173 lb 9.6 oz (78.744 kg)  03/02/13 173 lb 6.4 oz  (78.654 kg)  11/10/12 174 lb (78.926 kg)    BMI:  Body mass index is 23 kg/(m^2).  Estimated Nutritional Needs:   Kcal:  2100-2300  Protein:  110-120 gm  Fluid:  1200 ml  EDUCATION NEEDS:   No education needs identified at this time  Arthur Holms, RD, LDN Pager #: [phone] After-Hours Pager #: [phone]

## 2015-04-20 NOTE — Progress Notes (Signed)
Utilization review completed. Daquane Aguilar, RN, BSN. 

## 2015-04-20 NOTE — Consult Note (Signed)
[facility] Renal Consultation Note    Indication for Consultation:  Management of ESRD/hemodialysis; anemia, hypertension/volume and secondary hyperparathyroidism PCP:  HPI: Gerald Davenport is a 74 y.o. male with ESRD on hemodialysis MWF at [facility]. PMH significant for HTN, DM, thyroid disease, anema, prostate cancer, anxiety, diverticulosis, arterial clot R. Leg, R BKA 2004.  Was admitted 04/19/2015 with two week history of purulent drainage from RUE AV graft. Was taken to OR per Dr. Bridgett Larsson for excision of RUA AVF today. Has L perm cath per [facility] as AVG was presumed to be clotted. Has been started on Vanc and zosyn until culture results available. Patient denies pain, fever, chills, SOB, Chest pain, headache, syncope, palpitations.   Pt has HD MWF at [facility]. Attends regularly, stays for full treatment. Last lab results from kidney center:  Hgb 9.0 WBC 11.1, Phos 2.6 Ca 8.1 C Ca 8.7 PTH 428 (04/18/15).   Past Medical History  Diagnosis Date  . Hypertension   . Hyperlipidemia   . Chronic kidney disease     dialysis Tues, Thur, Sat  . Diabetes mellitus   . Cancer     Prostate cancer treated with radiation therapy  . Thyroid disease     hyperparathyroidism  . Anemia   . Positive PPD   . Clot 05/2006    Arterial clot right leg   . Hemodialysis patient 6/13  . Diverticulosis   . Anxiety    Past Surgical History  Procedure Laterality Date  . 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 Av fistula placement  11/04/2010    left and left  . Av fistula placement  11/20/2011    Procedure: INSERTION OF ARTERIOVENOUS (AV) GORE-TEX GRAFT ARM;  Surgeon: Angelia Mould, MD;  Location: [facility] OR;  Service: Vascular;  Laterality: Right;  . Colonoscopy N/A 11/10/2012    NUU:[id number] proctitis/Pancolonic diverticulosis/(TUBULAR ADENOMA)5 mm polyp in the transverse segment/4 mm polyps in the base of the cecum and diminutive polyp in the descending  segment. INADEQUATE BOWEL PREP  . Colonoscopy N/A 11/28/2013    RMR: Radiation proctitis. Colonic diverticulosis. Colonic polyps-removed as described above.   [name] N/A 10/08/2012    Procedure: [name];  Surgeon: Elam Dutch, MD;  Location: [facility] CATH LAB;  Service: Cardiovascular;  Laterality: N/A;  . Av fistula placement Right 04/19/2015    Procedure: Excision of infected RUA AVG;  Surgeon: Conrad Lind, MD;  Location: [facility] OR;  Service: Vascular;  Laterality: Right;   Family History  Problem Relation Age of Onset  . Heart disease Mother   . Diabetes Mother   . Hypertension Mother   . Stroke Mother   . Heart disease Father   . Diabetes Father   . Heart disease Sister   . Diabetes Brother   . Colon cancer Neg Hx    Social History:  reports that he has been smoking Cigarettes.  He has a 50 pack-year smoking history. He has never used smokeless tobacco. He reports that he does not drink alcohol or use illicit drugs. No Known Allergies Prior to Admission medications   Medication Sig Start Date End Date Taking? Authorizing Provider  aspirin 81 MG tablet Take 81-162 mg by mouth 2 (two) times daily.    Yes Historical Provider, MD  buPROPion (WELLBUTRIN XL) 150 MG 24 hr tablet Take 150 mg by mouth daily. 03/05/15  Yes Historical Provider, MD  calcium acetate (PHOSLO) 667 MG capsule Take 2,001  mg by mouth 2 (two) times daily.  10/26/12  Yes Historical Provider, MD  cloNIDine (CATAPRES) 0.2 MG tablet Take 0.2 mg by mouth 3 (three) times daily.    Yes Historical Provider, MD  doxazosin (CARDURA) 8 MG tablet Take 8 mg by mouth at bedtime.    Yes Historical Provider, MD  furosemide (LASIX) 40 MG tablet Take 40 mg by mouth 2 (two) times daily.     Yes Historical Provider, MD  gabapentin (NEURONTIN) 100 MG capsule Take 100 mg by mouth 2 (two) times daily. 04/13/15  Yes Historical Provider, MD  insulin detemir (LEVEMIR) 100 UNIT/ML injection Inject 20 Units into the skin at bedtime.    Yes  Historical Provider, MD  Linaclotide Rolan Lipa) 145 MCG CAPS capsule Take 1 capsule (145 mcg total) by mouth daily. Take 30 minutes before breakfast 02/06/15  Yes Carlis Stable, NP  QUEtiapine (SEROQUEL) 100 MG tablet Take 100 mg by mouth at bedtime. 03/05/15  Yes Historical Provider, MD  saxagliptin HCl (ONGLYZA) 5 MG TABS tablet Take 5 mg by mouth daily.   Yes Historical Provider, MD  simvastatin (ZOCOR) 20 MG tablet Take 20 mg by mouth at bedtime.     Yes Historical Provider, MD  Vitamin D, Ergocalciferol, (DRISDOL) 50000 UNITS CAPS Take 50,000 Units by mouth every 7 (seven) days. mondays   Yes Historical Provider, MD   Current Facility-Administered Medications  Medication Dose Route Frequency Provider Last Rate Last Dose  . 0.9 %  sodium chloride infusion  250 mL Intravenous PRN Hulen Shouts Rhyne, PA-C      . acetaminophen (TYLENOL) tablet 325-650 mg  325-650 mg Oral Q4H PRN Hulen Shouts Rhyne, PA-C       Or  . acetaminophen (TYLENOL) suppository 325-650 mg  325-650 mg Rectal Q4H PRN Hulen Shouts Rhyne, PA-C      . aspirin EC tablet 81 mg  81 mg Oral BID Conrad Farmland, MD   81 mg at 04/19/15 2123  . bisacodyl (DULCOLAX) suppository 10 mg  10 mg Rectal Daily PRN Hulen Shouts Rhyne, PA-C      . buPROPion (WELLBUTRIN XL) 24 hr tablet 150 mg  150 mg Oral Daily Samantha J Rhyne, PA-C      . calcium acetate (PHOSLO) capsule 2,001 mg  2,001 mg Oral BID WC Conrad Oceano, MD      . cloNIDine (CATAPRES) tablet 0.2 mg  0.2 mg Oral TID Hulen Shouts Rhyne, PA-C   0.2 mg at 04/19/15 2123  . doxazosin (CARDURA) tablet 8 mg  8 mg Oral QHS Hulen Shouts Rhyne, PA-C   8 mg at 04/19/15 2123  . furosemide (LASIX) tablet 40 mg  40 mg Oral BID Samantha J Rhyne, PA-C      . gabapentin (NEURONTIN) capsule 100 mg  100 mg Oral BID Hulen Shouts Rhyne, PA-C   100 mg at 04/19/15 2123  . guaiFENesin-dextromethorphan (ROBITUSSIN DM) 100-10 MG/5ML syrup 15 mL  15 mL Oral Q4H PRN Samantha J Rhyne, PA-C      . heparin injection 5,000 Units   5,000 Units Subcutaneous 3 times per day Gabriel Earing, PA-C   5,000 Units at 04/20/15 0609  . hydrALAZINE (APRESOLINE) injection 10 mg  10 mg Intravenous Q2H PRN Conrad [place]umb[place], MD      . insulin aspart (novoLOG) injection 0-15 Units  0-15 Units Subcutaneous TID WC Conrad [place], MD      . insulin detemir (LEVEMIR) injection 20 Units  20 Units Subcutaneous QHS  Samantha J Rhyne, PA-C      . labetalol (NORMODYNE,TRANDATE) injection 10 mg  10 mg Intravenous Q10 min PRN Samantha J Rhyne, PA-C      . Linaclotide (LINZESS) capsule 145 mcg  145 mcg Oral QAC breakfast Gabriel Earing, PA-C   145 mcg at 04/20/15 0160  . linagliptin (TRADJENTA) tablet 5 mg  5 mg Oral Daily Samantha J Rhyne, PA-C      . metoprolol (LOPRESSOR) injection 2-5 mg  2-5 mg Intravenous Q2H PRN Samantha J Rhyne, PA-C      . morphine 2 MG/ML injection 2-3 mg  2-3 mg Intravenous Q2H PRN Samantha J Rhyne, PA-C      . ondansetron (ZOFRAN) injection 4 mg  4 mg Intravenous Q6H PRN Samantha J Rhyne, PA-C      . oxyCODONE (Oxy IR/ROXICODONE) immediate release tablet 5-10 mg  5-10 mg Oral Q4H PRN Samantha J Rhyne, PA-C      . pantoprazole (PROTONIX) EC tablet 40 mg  40 mg Oral QHS Samantha J Rhyne, PA-C   40 mg at 04/19/15 2123  . phenol (CHLORASEPTIC) mouth spray 1 spray  1 spray Mouth/Throat PRN Samantha J Rhyne, PA-C      . piperacillin-tazobactam (ZOSYN) IVPB 2.25 g  2.25 g Intravenous 3 times per day Karren Cobble, RPH   2.25 g at 04/20/15 0610  . potassium chloride SA (K-DUR,KLOR-CON) CR tablet 20-40 mEq  20-40 mEq Oral Once [employer], PA-C   20 mEq at 04/19/15 2200  . QUEtiapine (SEROQUEL) tablet 100 mg  100 mg Oral QHS Hulen Shouts Rhyne, PA-C   100 mg at 04/19/15 2124  . simvastatin (ZOCOR) tablet 20 mg  20 mg Oral QHS Hulen Shouts Rhyne, PA-C   20 mg at 04/19/15 2123  . sodium chloride 0.9 % injection 3 mL  3 mL Intravenous Q12H Samantha J Rhyne, PA-C   3 mL at 04/19/15 2200  . sodium chloride 0.9 % injection 3 mL  3 mL  Intravenous PRN Samantha J Rhyne, PA-C      . vancomycin (VANCOCIN) IVPB 750 mg/150 ml premix  750 mg Intravenous Q M,W,F-HD Karren Cobble, RPH      . [START ON 04/23/2015] Vitamin D (Ergocalciferol) (DRISDOL) capsule 50,000 Units  50,000 Units Oral Q7 days Gabriel Earing, PA-C       Labs: Basic Metabolic Panel:  Recent Labs Lab 04/19/15 1601 04/19/15 2240 04/20/15 0410  NA 130*  --  129*  K 4.4  --  5.2*  CL 92*  --  91*  CO2 26  --  25  GLUCOSE 337*  --  213*  BUN 35*  --  37*  CREATININE 8.59* 9.15* 9.55*  CALCIUM 8.3*  --  7.8*   CBC:  Recent Labs Lab 04/19/15 1601 04/19/15 2240 04/20/15 0410  WBC 11.1* 15.0* 10.0  NEUTROABS 9.0*  --   --   HGB 9.4* 10.2* 9.6*  HCT 29.6* 31.5* 30.1*  MCV 103.9* 103.3* 103.8*  PLT 375 374 253   Cardiac Enzymes: No results for input(s): CKTOTAL, CKMB, CKMBINDEX, TROPONINI in the last 168 hours. CBG:  Recent Labs Lab 04/19/15 1743 04/19/15 1958 04/19/15 2239 04/20/15 0615  GLUCAP 262* 200* 183* 216*   Iron Studies: No results for input(s): IRON, TIBC, TRANSFERRIN, FERRITIN in the last 72 hours. Studies/Results: No results found.  ROS: As per HPI otherwise negative.  Physical Exam: Filed Vitals:   04/19/15 2140 04/19/15 2259 04/20/15 0258 04/20/15 0540  BP:  192/64 139/58 134/49 142/52  Pulse: 78 74 65 79  Temp:   98.9 F (37.2 C) 99.1 F (37.3 C)  TempSrc:   Oral Oral  Resp:   16 17  Height:      Weight:      SpO2:   95% 100%     General: Well developed, well nourished, in no acute distress. Head: Normocephalic, atraumatic, sclera non-icteric, mucus membranes are moist Neck: Supple. JVD not elevated. Lungs: Clear bilaterally to auscultation without wheezes, rales, or rhonchi. Breathing is unlabored. Heart: RRR with S1 S2. No murmurs, rubs, or gallops appreciated. Abdomen: Soft, non-tender, non-distended with normoactive bowel sounds. No rebound/guarding. No obvious abdominal masses. M-S:  Strength and  tone appear normal for age. Lower extremities: RKA. Faint pulse LLE. No edema, ischemic changes, no open wounds  Neuro: Alert and oriented X 3. Moves all extremities spontaneously. CN II-XII grossly intact Psych:  Responds to questions appropriately with a normal affect. Dialysis Access: L perm cath, capped.   Dialysis Orders: Center: [facility] on MWF . EDW 69.5 HD Bath 2.0 K 2.5 Ca 1 mg  Time 4 hours  Heparin 3000 units per treatment. Access L perm cath BFR 400 DFR Autoflow 1.5    Mircera 100 mcg q 2 weeks (last dose 04/01/15)  Assessment/Plan: 1.  Infected right upper arm graft: Excision AVF 007/22 per Dr. Bridgett Larsson. Vanc/Zosyn. Cultures pending  2.  ESRD -  MWF [place]. For HD today via catheter 3.  Hypertension/volume  - Had HD 04/18/15 in center. Left within acceptable range of EDW. Per incenter home med list patient takes clonidine 0.2 mg PO TID for BP. Todays wt 70.6 kg. Will attempt 2-2.5 liters 4.  Anemia  - HGB 9.6 today. Will monitor CBC and start ESA if needed.  5.  Metabolic bone disease -  Ca 7.5 C Ca 8.14 Phos 2.6 (04/18/15) Decrease Ca Acetate to 1334 mg TID. 2.5 Ca Bath.  6.  Nutrition - Albumin 3.2 (04/18/15). Renal diet, Nepro, renal vit. Dietary consult protein malnutrition.   Rita H. Owens Shark, NP-C 04/20/2015, 10:09 AM  [employer] [phone]

## 2015-04-21 LAB — GLUCOSE, CAPILLARY
GLUCOSE-CAPILLARY: 159 mg/dL — AB (ref 65–99)
GLUCOSE-CAPILLARY: 136 mg/dL — AB (ref 65–99)
Glucose-Capillary: 89 mg/dL (ref 65–99)
Glucose-Capillary: 131 mg/dL — ABNORMAL HIGH (ref 65–99)
Glucose-Capillary: 229 mg/dL — ABNORMAL HIGH (ref 65–99)

## 2015-04-21 MED ORDER — VANCOMYCIN HCL 500 MG IV SOLR
500.0000 mg | INTRAVENOUS | Status: AC
  Administered 2015-04-21: 500 mg via INTRAVENOUS
  Filled 2015-04-21: qty 500

## 2015-04-21 MED ORDER — MORPHINE SULFATE 2 MG/ML IJ SOLN
2.0000 mg | Freq: Once | INTRAMUSCULAR | Status: AC
  Administered 2015-04-21: 2 mg via INTRAVENOUS

## 2015-04-21 MED ORDER — FUROSEMIDE 10 MG/ML IJ SOLN
INTRAMUSCULAR | Status: AC
  Administered 2015-04-21: 10:00:00
  Filled 2015-04-21: qty 4

## 2015-04-21 NOTE — Progress Notes (Signed)
Admit: 04/19/2015 LOS: 2  47M w/ infected AVG s/p excision 7/22, using TDC.    Subjective:  HD yesterday, 2.5L post weight 71.2kg No new issues Afebrile  07/22 0701 - 07/23 0700 In: 480 [P.O.:480] Out: 2500   Filed Weights   04/19/15 2049 04/20/15 1634 04/20/15 2051  Weight: 70.67 kg (155 lb 12.8 oz) 74.2 kg (163 lb 9.3 oz) 71.2 kg (156 lb 15.5 oz)    Scheduled Meds: . aspirin EC  81 mg Oral BID  . buPROPion  150 mg Oral Daily  . calcium acetate  1,334 mg Oral BID WC  . cloNIDine  0.2 mg Oral TID  . doxazosin  8 mg Oral QHS  . feeding supplement (NEPRO CARB STEADY)  237 mL Oral BID BM  . furosemide  40 mg Oral BID  . gabapentin  100 mg Oral BID  . heparin  5,000 Units Subcutaneous 3 times per day  . insulin aspart  0-15 Units Subcutaneous TID WC  . insulin detemir  20 Units Subcutaneous QHS  . Linaclotide  145 mcg Oral QAC breakfast  . linagliptin  5 mg Oral Daily  . pantoprazole  40 mg Oral QHS  . piperacillin-tazobactam (ZOSYN)  IV  2.25 g Intravenous 3 times per day  . potassium chloride  20-40 mEq Oral Once  . QUEtiapine  100 mg Oral QHS  . simvastatin  20 mg Oral QHS  . sodium chloride  3 mL Intravenous Q12H  . vancomycin  500 mg Intravenous Once  . vancomycin  750 mg Intravenous Q M,W,F-HD  . [START ON 04/23/2015] Vitamin D (Ergocalciferol)  50,000 Units Oral Q7 days   Continuous Infusions:  PRN Meds:.sodium chloride, acetaminophen **OR** acetaminophen, bisacodyl, guaiFENesin-dextromethorphan, hydrALAZINE, labetalol, metoprolol, morphine injection, ondansetron, oxyCODONE, phenol, sodium chloride  Current Labs: reviewed    Physical Exam:  Blood pressure 139/51, pulse 79, temperature 98.2 F (36.8 C), temperature source Oral, resp. rate 18, height 5\' 9"  (1.753 m), weight 71.2 kg (156 lb 15.5 oz), SpO2 97 %. NAD RRR TDC in place No LEE RUE Bandaged Nonfocal, AAOx3  Dialysis Orders: Center: [facility] on MWF . EDW 69.5 HD Bath 2.0 K 2.5 Ca 1 mg Time 4  hours Heparin 3000 units per treatment. Access L perm cath BFR 400 DFR Autoflow 1.5  Mircera 100 mcg q 2 weeks (last dose 04/01/15)  A/P 1. ESRD 1. MWF [place] 2. Continues on Schedule using TDC 3. Will cont to hold heparin post-op for at least one more Tx 2. Infected RUE AVG 1. VVS excised 7/22 2. On Vanc/Zosyn 1. 7/21: B Cx x3 NGTD 2. Wound Cx NGTD 3. HTN/Vol 1. Outpt EDW 69.5kg, keep at EDW for now; weights here likely inaccurate 2. BP stable 4. Anemia: 1. Hb stable 2. ESA last given outpt 7/3 5. MBD: Cont PhosLo. 2.5Ca bath  Pearson Grippe MD 04/21/2015, 9:07 AM   Recent Labs Lab 04/19/15 1601 04/19/15 2240 04/20/15 0410  NA 130*  --  129*  K 4.4  --  5.2*  CL 92*  --  91*  CO2 26  --  25  GLUCOSE 337*  --  213*  BUN 35*  --  37*  CREATININE 8.59* 9.15* 9.55*  CALCIUM 8.3*  --  7.8*    Recent Labs Lab 04/19/15 1601 04/19/15 2240 04/20/15 0410  WBC 11.1* 15.0* 10.0  NEUTROABS 9.0*  --   --   HGB 9.4* 10.2* 9.6*  HCT 29.6* 31.5* 30.1*  MCV 103.9* 103.3* 103.8*  PLT  375 374 253              

## 2015-04-21 NOTE — Progress Notes (Signed)
  Progress Note    04/21/2015 9:31 AM 2 Days Post-Op  Subjective:  No complaints  Afebrile  130's ow (119'E-174'Y systolic) HR 81'K-481'E  56% RA  Filed Vitals:   04/21/15 0500  BP: 139/51  Pulse: 79  Temp: 98.2 F (36.8 C)  Resp: 18    Physical Exam: Incisions:  Proximal and distal incisions are clean with staples in tact.  Mid arm wound packing removed and wound bed is clean   CBC    Component Value Date/Time   WBC 10.0 04/20/2015 0410   RBC 2.90* 04/20/2015 0410   HGB 9.6* 04/20/2015 0410   HCT 30.1* 04/20/2015 0410   PLT 253 04/20/2015 0410   MCV 103.8* 04/20/2015 0410   MCH 33.1 04/20/2015 0410   MCHC 31.9 04/20/2015 0410   RDW 18.8* 04/20/2015 0410   LYMPHSABS 1.3 04/19/2015 1601   MONOABS 0.6 04/19/2015 1601   EOSABS 0.2 04/19/2015 1601   BASOSABS 0.0 04/19/2015 1601    BMET    Component Value Date/Time   NA 129* 04/20/2015 0410   K 5.2* 04/20/2015 0410   CL 91* 04/20/2015 0410   CO2 25 04/20/2015 0410   GLUCOSE 213* 04/20/2015 0410   BUN 37* 04/20/2015 0410   CREATININE 9.55* 04/20/2015 0410   CREATININE 9.19* 02/06/2015 1206   CALCIUM 7.8* 04/20/2015 0410   GFRNONAA 5* 04/20/2015 0410   GFRAA 5* 04/20/2015 0410    INR No results found for: INR   Intake/Output Summary (Last 24 hours) at 04/21/15 0931 Last data filed at 04/20/15 2051  Gross per 24 hour  Intake    240 ml  Output   2500 ml  Net  -2260 ml     Assessment:  74 y.o. male is s/p:  Removal RUA AVG  2 Days Post-Op  Plan: -wound bed is clean. Wound was repacked and pt tolerated very well -continue dressing changes bid -DVT prophylaxis:  SQ heparin -continue ABx -HD per renal   Gerald Locket, PA-C [facility] [phone] 04/21/2015 9:31 AM    I agree with the above.  Dressing was removed today and repacked.  The wound bed is healing appropriately.  Continue anabiotic.  Gerald Davenport

## 2015-04-22 LAB — CULTURE, ROUTINE-ABSCESS

## 2015-04-22 LAB — GLUCOSE, CAPILLARY
GLUCOSE-CAPILLARY: 122 mg/dL — AB (ref 65–99)
GLUCOSE-CAPILLARY: 114 mg/dL — AB (ref 65–99)
Glucose-Capillary: 176 mg/dL — ABNORMAL HIGH (ref 65–99)
Glucose-Capillary: 253 mg/dL — ABNORMAL HIGH (ref 65–99)

## 2015-04-22 MED ORDER — DARBEPOETIN ALFA 100 MCG/0.5ML IJ SOSY
100.0000 ug | PREFILLED_SYRINGE | INTRAMUSCULAR | Status: DC
  Administered 2015-04-23: 100 ug via INTRAVENOUS
  Filled 2015-04-22: qty 0.5

## 2015-04-22 MED ORDER — CIPROFLOXACIN HCL 500 MG PO TABS
500.0000 mg | ORAL_TABLET | ORAL | Status: DC
  Administered 2015-04-22 – 2015-04-24 (×3): 500 mg via ORAL
  Filled 2015-04-22 (×3): qty 1

## 2015-04-22 NOTE — Progress Notes (Signed)
Changed dressing to right arm using wet to dry

## 2015-04-22 NOTE — Progress Notes (Signed)
Admit: 04/19/2015 LOS: 3  59M w/ infected AVG s/p excision 7/22, using TDC.    Subjective:  No new events  07/23 0701 - 07/24 0700 In: 240 [P.O.:240] Out: -   Filed Weights   04/19/15 2049 04/20/15 1634 04/20/15 2051  Weight: 70.67 kg (155 lb 12.8 oz) 74.2 kg (163 lb 9.3 oz) 71.2 kg (156 lb 15.5 oz)    Scheduled Meds: . aspirin EC  81 mg Oral BID  . buPROPion  150 mg Oral Daily  . calcium acetate  1,334 mg Oral BID WC  . cloNIDine  0.2 mg Oral TID  . doxazosin  8 mg Oral QHS  . feeding supplement (NEPRO CARB STEADY)  237 mL Oral BID BM  . furosemide  40 mg Oral BID  . gabapentin  100 mg Oral BID  . heparin  5,000 Units Subcutaneous 3 times per day  . insulin aspart  0-15 Units Subcutaneous TID WC  . insulin detemir  20 Units Subcutaneous QHS  . Linaclotide  145 mcg Oral QAC breakfast  . linagliptin  5 mg Oral Daily  . pantoprazole  40 mg Oral QHS  . piperacillin-tazobactam (ZOSYN)  IV  2.25 g Intravenous 3 times per day  . potassium chloride  20-40 mEq Oral Once  . QUEtiapine  100 mg Oral QHS  . simvastatin  20 mg Oral QHS  . sodium chloride  3 mL Intravenous Q12H  . vancomycin  750 mg Intravenous Q M,W,F-HD  . [START ON 04/23/2015] Vitamin D (Ergocalciferol)  50,000 Units Oral Q7 days   Continuous Infusions:  PRN Meds:.sodium chloride, acetaminophen **OR** acetaminophen, bisacodyl, guaiFENesin-dextromethorphan, hydrALAZINE, labetalol, metoprolol, morphine injection, ondansetron, oxyCODONE, phenol, sodium chloride  Current Labs: reviewed    Physical Exam:  Blood pressure 139/75, pulse 73, temperature 97.5 F (36.4 C), temperature source Oral, resp. rate 16, height 5\' 9"  (1.753 m), weight 71.2 kg (156 lb 15.5 oz), SpO2 98 %. NAD RRR TDC in place No LEE RUE Bandaged Nonfocal, AAOx3  Dialysis Orders: Center: [facility] on MWF . EDW 69.5 HD Bath 2.0 K 2.5 Ca 1 mg Time 4 hours Heparin 3000 units per treatment. Access L perm cath BFR 400 DFR Autoflow 1.5  Mircera  100 mcg q 2 weeks (last dose 04/01/15)  A/P 1. ESRD 1. MWF [place] 2. Continues on Schedule using TDC 3. Will cont to hold heparin post-op for at least one more Tx 2. Infected RUE AVG 1. VVS excised 7/22 2. On Vanc/Zosyn 1. 7/21: B Cx x3 NGTD 2. Wound Cx citrobacter freundii R to cefazolin only 1. Could cont outpt HD IV ABX as Ceftazidime if ok with VVS 2. Not sure if needs GPC coverage 3. HTN/Vol 1. Outpt EDW 69.5kg, keep at EDW for now; weights here likely inaccurate 2. BP stable 4. Anemia: 1. Hb stable 2. ESA last given outpt 7/3 3. Redose aranesp at HD 7/25 5. MBD: Cont PhosLo. 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

## 2015-04-22 NOTE — Progress Notes (Signed)
    Subjective  - POD #3  Pain is better today   Physical Exam:  Wounds are clean and dry       Assessment/Plan:  POD #3  Continue daily dressing changes Hemodialysis per renal Anticipate discharge tomorrow  Annamarie Major 04/22/2015 10:22 AM --  Danley Danker Vitals:   04/22/15 0420  BP: 139/75  Pulse: 73  Temp: 97.5 F (36.4 C)  Resp: 16    Intake/Output Summary (Last 24 hours) at 04/22/15 1022 Last data filed at 04/22/15 0840  Gross per 24 hour  Intake    600 ml  Output      0 ml  Net    600 ml     Laboratory CBC    Component Value Date/Time   WBC 10.0 04/20/2015 0410   HGB 9.6* 04/20/2015 0410   HCT 30.1* 04/20/2015 0410   PLT 253 04/20/2015 0410    BMET    Component Value Date/Time   NA 129* 04/20/2015 0410   K 5.2* 04/20/2015 0410   CL 91* 04/20/2015 0410   CO2 25 04/20/2015 0410   GLUCOSE 213* 04/20/2015 0410   BUN 37* 04/20/2015 0410   CREATININE 9.55* 04/20/2015 0410   CREATININE 9.19* 02/06/2015 1206   CALCIUM 7.8* 04/20/2015 0410   GFRNONAA 5* 04/20/2015 0410   GFRAA 5* 04/20/2015 0410    COAG No results found for: INR, PROTIME No results found for: PTT  Antibiotics Anti-infectives    Start     Dose/Rate Route Frequency Ordered Stop   04/21/15 1215  vancomycin (VANCOCIN) 500 mg in sodium chloride 0.9 % 100 mL IVPB     500 mg 100 mL/hr over 60 Minutes Intravenous NOW 04/21/15 1149 04/21/15 1312   04/20/15 2145  vancomycin (VANCOCIN) 500 mg in sodium chloride 0.9 % 100 mL IVPB  Status:  Discontinued     500 mg 100 mL/hr over 60 Minutes Intravenous  Once 04/20/15 2132 04/21/15 1149   04/20/15 1200  vancomycin (VANCOCIN) IVPB 750 mg/150 ml premix     750 mg 150 mL/hr over 60 Minutes Intravenous Every M-W-F (Hemodialysis) 04/19/15 2112     04/19/15 2200  piperacillin-tazobactam (ZOSYN) IVPB 2.25 g     2.25 g 100 mL/hr over 30 Minutes Intravenous 3 times per day 04/19/15 2112     04/19/15 2115  vancomycin (VANCOCIN) 1,500 mg in  sodium chloride 0.9 % 500 mL IVPB     1,500 mg 250 mL/hr over 120 Minutes Intravenous  Once 04/19/15 2112 04/20/15 0059   04/19/15 1814  vancomycin (VANCOCIN) 1 GM/200ML IVPB    Comments:  Sammuel Cooper   : cabinet override      04/19/15 1814 04/20/15 0629       V. Leia Alf, M.D. [facility] [place] Office: [phone] Pager:  [phone]

## 2015-04-23 ENCOUNTER — Encounter: Payer: Self-pay | Admitting: Vascular Surgery

## 2015-04-23 LAB — RENAL FUNCTION PANEL
ALBUMIN: 2.1 g/dL — AB (ref 3.5–5.0)
ANION GAP: 10 (ref 5–15)
BUN: 35 mg/dL — AB (ref 6–20)
CO2: 24 mmol/L (ref 22–32)
Calcium: 7.7 mg/dL — ABNORMAL LOW (ref 8.9–10.3)
Chloride: 92 mmol/L — ABNORMAL LOW (ref 101–111)
Creatinine, Ser: 9.1 mg/dL — ABNORMAL HIGH (ref 0.61–1.24)
GFR calc non Af Amer: 5 mL/min — ABNORMAL LOW (ref >60)
GFR, EST AFRICAN AMERICAN: 6 mL/min — AB (ref >60)
Glucose, Bld: 118 mg/dL — ABNORMAL HIGH (ref 65–99)
Phosphorus: 4.5 mg/dL (ref 2.5–4.6)
Potassium: 3.4 mmol/L — ABNORMAL LOW (ref 3.5–5.1)
Sodium: 126 mmol/L — ABNORMAL LOW (ref 135–145)

## 2015-04-23 LAB — CBC
HCT: 25.6 % — ABNORMAL LOW (ref 39.0–52.0)
Hemoglobin: 8.5 g/dL — ABNORMAL LOW (ref 13.0–17.0)
MCH: 33.6 pg (ref 26.0–34.0)
MCHC: 33.2 g/dL (ref 30.0–36.0)
MCV: 101.2 fL — AB (ref 78.0–100.0)
PLATELETS: 395 10*3/uL (ref 150–400)
RBC: 2.53 MIL/uL — ABNORMAL LOW (ref 4.22–5.81)
RDW: 18.5 % — AB (ref 11.5–15.5)
WBC: 9.1 10*3/uL (ref 4.0–10.5)

## 2015-04-23 LAB — CBC WITH DIFFERENTIAL/PLATELET
BASOS ABS: 0 10*3/uL (ref 0.0–0.1)
Basophils Relative: 0 % (ref 0–1)
Eosinophils Absolute: 0.3 10*3/uL (ref 0.0–0.7)
Eosinophils Relative: 4 % (ref 0–5)
HCT: 24.2 % — ABNORMAL LOW (ref 39.0–52.0)
HEMOGLOBIN: 8 g/dL — AB (ref 13.0–17.0)
Lymphocytes Relative: 13 % (ref 12–46)
Lymphs Abs: 1.1 10*3/uL (ref 0.7–4.0)
MCH: 33.3 pg (ref 26.0–34.0)
MCHC: 33.1 g/dL (ref 30.0–36.0)
MCV: 100.8 fL — ABNORMAL HIGH (ref 78.0–100.0)
Monocytes Absolute: 0.6 10*3/uL (ref 0.1–1.0)
Monocytes Relative: 7 % (ref 3–12)
NEUTROS PCT: 76 % (ref 43–77)
Neutro Abs: 6.5 10*3/uL (ref 1.7–7.7)
PLATELETS: 393 10*3/uL (ref 150–400)
RBC: 2.4 MIL/uL — ABNORMAL LOW (ref 4.22–5.81)
RDW: 18.4 % — AB (ref 11.5–15.5)
WBC: 8.6 10*3/uL (ref 4.0–10.5)

## 2015-04-23 LAB — GLUCOSE, CAPILLARY
GLUCOSE-CAPILLARY: 161 mg/dL — AB (ref 65–99)
GLUCOSE-CAPILLARY: 175 mg/dL — AB (ref 65–99)
Glucose-Capillary: 134 mg/dL — ABNORMAL HIGH (ref 65–99)
Glucose-Capillary: 286 mg/dL — ABNORMAL HIGH (ref 65–99)

## 2015-04-23 LAB — WOUND CULTURE: Gram Stain: NONE SEEN

## 2015-04-23 LAB — HEMOGLOBIN A1C
Hgb A1c MFr Bld: 7.7 % — ABNORMAL HIGH (ref 4.8–5.6)
Mean Plasma Glucose: 174 mg/dL

## 2015-04-23 MED ORDER — DARBEPOETIN ALFA 100 MCG/0.5ML IJ SOSY
PREFILLED_SYRINGE | INTRAMUSCULAR | Status: AC
  Administered 2015-04-23: 10:00:00
  Filled 2015-04-23: qty 0.5

## 2015-04-23 NOTE — Clinical Documentation Improvement (Signed)
  Anemia is documented in the medical record.  Please also specify the cause of the anemia.  Hgb= 9.4.  Anemia in chronic disease Anemia due to iron deficiency Anemia due to acute blood loss Anemia due to chronic blood loss Unable to suspect or determine  Thank you!  Pryor Montes, BSN, RN  HIM/Clinical Documentation Specialist [email or web address] [phone]

## 2015-04-23 NOTE — Progress Notes (Addendum)
   Daily Progress Note  Assessment/Planning: POD #4 s/p RUA AVG/Viabahn excision for infection   No signs of bleeding  Wound clean -> start wound VAC  Setup up home VAC  Wound cx: GNR, C&S pending  Continue Vanco & Zosyn until final cx back  Subjective  - 4 Days Post-Op  No complaints, pain ok  Objective Filed Vitals:   04/23/15 1130 04/23/15 1147 04/23/15 1150 04/23/15 1400  BP: 102/46 124/50 129/58 125/45  Pulse: 93 88 80 84  Temp:    98.4 F (36.9 C)  TempSrc:    Oral  Resp: 21 21 21 20   Height:      Weight:   152 lb 1.9 oz (69 kg)   SpO2:    93%    Intake/Output Summary (Last 24 hours) at 04/23/15 1508 Last data filed at 04/23/15 1403  Gross per 24 hour  Intake    960 ml  Output      0 ml  Net    960 ml    PULM  CTAB CV  RRR GI  soft, NTND VASC  RUA: arterial and venous exposure inc c/d/i, staples in place, mid-segment wound clean, no active bleeding  Laboratory CBC    Component Value Date/Time   WBC 8.6 04/23/2015 0753   HGB 8.0* 04/23/2015 0753   HCT 24.2* 04/23/2015 0753   PLT 393 04/23/2015 0753    BMET    Component Value Date/Time   NA 126* 04/23/2015 0800   K 3.4* 04/23/2015 0800   CL 92* 04/23/2015 0800   CO2 24 04/23/2015 0800   GLUCOSE 118* 04/23/2015 0800   BUN 35* 04/23/2015 0800   CREATININE 9.10* 04/23/2015 0800   CREATININE 9.19* 02/06/2015 1206   CALCIUM 7.7* 04/23/2015 0800   GFRNONAA 5* 04/23/2015 0800   GFRAA 6* 04/23/2015 0800    Adele Barthel, MD [facility] [place]: [phone] Pager: [phone]  04/23/2015, 3:08 PM

## 2015-04-23 NOTE — Consult Note (Signed)
WOC wound follow up Wound type: surgical Measurement:11cm x 2.7cm x 0.7cm Wound bed: subcutaneous tissue, no granulation tissue Drainage (amount, consistency, odor) minimal Periwound: intact, two areas with intact staples, one distal and one proximal to the open wound Dressing procedure/placement/frequency: 1pc of black granufoam placed in the wound bed, seal at 125 mmHG.  Pt tolerated well.  Secured tubing to the patient and wrapped the area in conform gauze to cover the stapled areas.  Instructed the patient to remember he has pump attached and how to unplug if he gets up.  [place] for bedside nurses to change M/W/F Para March RN,CWOCN [phone]

## 2015-04-23 NOTE — Progress Notes (Signed)
Subjective:  On hd tolerating on schedule  / for possible dc today per VVS/ no cos on hd  Objective Vital signs in last 24 hours: Filed Vitals:   04/22/15 2045 04/23/15 0439 04/23/15 0743 04/23/15 0800  BP: 162/53 157/71 162/71 144/58  Pulse: 77 76 73 73  Temp: 97.4 F (36.3 C) 97.7 F (36.5 C) 97.6 F (36.4 C)   TempSrc: Oral Oral Oral   Resp: 18 18 16 16   Height:      Weight:   72.2 kg (159 lb 2.8 oz)   SpO2: 98% 100%     Weight change:   Physical Exam: General: alert NAD,OX3 nl MS  Heart: RRR  No , mur or gallop Lungs: CTA Abdomen: soft NT.ND Extremities:R U Arm Dressing dry  intact / no pedal edema L  Or R stump bka/ R BKA  Dialysis Access: R IJ perm cath patent on HD grn olc  Op Dialysis Orders: Center: [facility] on MWF . EDW 69.5 HD Bath 2.0 K 2.5 Ca 1 mg Time 4 hours Heparin 3000 units per treatment. Access L perm cath BFR 400 DFR Autoflow 1.5  Mircera 100 mcg q 2 weeks (last dose 04/01/15)  Problem/Plan: 1. Infected RUE AVG=VVS excised 7/22=Wound Cx citrobacter freundii R to cefazolin only,7/21: B Cx x3 NGTD/On Vanc/Zosyn/  outpt HD IV ABX as Ceftazidime if ok with VVS 2. ESRD - On hd MWF  Schedule REIDS.  kId center/ hold hep posterior op one more op tx wed. Then tight                                          3. HTN/vol  Stable bp and vol no change in edw with wts inaccurate in hosp 4. Anemia - HGB down to 8.0 sec surg /l .will give dose Aranesp today fu op/ esa ^ op  Fu labs per proto cal   5. Secondary hyperparathyroidism - stable ca phos on 2.5 ca bath  Ca correc=9.2 phos 4.5  Ernest Haber, PA-C [place] 04/23/2015,8:25 AM  LOS: 4 days   Pt seen, examined and agree w A/P as above.  Kelly Splinter MD pager [phone]    cell [phone] 04/23/2015, 2:05 PM    Labs: Basic Metabolic Panel:  Recent Labs Lab 04/19/15 1601 04/19/15 2240 04/20/15 0410 04/23/15 0800  NA 130*  --  129* 126*  K 4.4  --  5.2* 3.4*  CL  92*  --  91* 92*  CO2 26  --  25 24  GLUCOSE 337*  --  213* 118*  BUN 35*  --  37* 35*  CREATININE 8.59* 9.15* 9.55* 9.10*  CALCIUM 8.3*  --  7.8* 7.7*  PHOS  --   --   --  4.5   Liver Function Tests:  Recent Labs Lab 04/23/15 0800  ALBUMIN 2.1*  CBC:  Recent Labs Lab 04/19/15 1601 04/19/15 2240 04/20/15 0410 04/23/15 0348 04/23/15 0753  WBC 11.1* 15.0* 10.0 9.1 8.6  NEUTROABS 9.0*  --   --   --  6.5  HGB 9.4* 10.2* 9.6* 8.5* 8.0*  HCT 29.6* 31.5* 30.1* 25.6* 24.2*  MCV 103.9* 103.3* 103.8* 101.2* 100.8*  PLT 375 374 253 395 393   Cardiac Enzymes: No results for input(s): CKTOTAL, CKMB, CKMBINDEX, TROPONINI in the last 168 hours. CBG:  Recent Labs Lab 04/22/15 0604 04/22/15 1208 04/22/15 1647  04/22/15 2114 04/23/15 0615  GLUCAP 176* 122* 253* 114* 161*    Studies/Results: No results found. Medications:   . aspirin EC  81 mg Oral BID  . buPROPion  150 mg Oral Daily  . calcium acetate  1,334 mg Oral BID WC  . ciprofloxacin  500 mg Oral Q24H  . cloNIDine  0.2 mg Oral TID  . darbepoetin (ARANESP) injection - DIALYSIS  100 mcg Intravenous Q Mon-HD  . doxazosin  8 mg Oral QHS  . feeding supplement (NEPRO CARB STEADY)  237 mL Oral BID BM  . furosemide  40 mg Oral BID  . gabapentin  100 mg Oral BID  . heparin  5,000 Units Subcutaneous 3 times per day  . insulin aspart  0-15 Units Subcutaneous TID WC  . insulin detemir  20 Units Subcutaneous QHS  . Linaclotide  145 mcg Oral QAC breakfast  . linagliptin  5 mg Oral Daily  . pantoprazole  40 mg Oral QHS  . potassium chloride  20-40 mEq Oral Once  . QUEtiapine  100 mg Oral QHS  . simvastatin  20 mg Oral QHS  . sodium chloride  3 mL Intravenous Q12H  . Vitamin D (Ergocalciferol)  50,000 Units Oral Q7 days

## 2015-04-23 NOTE — Care Management Important Message (Signed)
Important Message  Patient Details  Name: SIEGFRIED VIETH MRN: [id number] Date of Birth: 03-Dec-1940   Medicare Important Message Given:  [facility] notification given    [name] May 04/23/2015, 2:52 [place] Message  Patient Details  Name: HARLAND AGUINIGA MRN: [id number] Date of Birth: [birth year]   Medicare Important Message Given:  Yes-second notification given    [name] May 04/23/2015, 2:52 PM

## 2015-04-23 NOTE — Clinical Documentation Improvement (Signed)
  Please specify the type of malnutrition.  Mild Moderate Severe Unable to suspect or determine  04/19/16 Progress Note:  "Nutrition- Albumin 3.2 (04/18/15). Renal diet, Nepro, renal vit. Dietary Consult protein malnutrition."  Thank you!  Pryor Montes, BSN, RN [place] HIM/Clinical Documentation Specialist [email or web address] [phone]

## 2015-04-24 LAB — CULTURE, BLOOD (ROUTINE X 2)
CULTURE: NO GROWTH
Culture: NO GROWTH

## 2015-04-24 LAB — CULTURE, BLOOD (SINGLE): Culture: NO GROWTH

## 2015-04-24 LAB — GLUCOSE, CAPILLARY
GLUCOSE-CAPILLARY: 267 mg/dL — AB (ref 65–99)
Glucose-Capillary: 270 mg/dL — ABNORMAL HIGH (ref 65–99)

## 2015-04-24 LAB — PREALBUMIN: Prealbumin: 21 mg/dL (ref 18–38)

## 2015-04-24 LAB — ALBUMIN: Albumin: 2.4 g/dL — ABNORMAL LOW (ref 3.5–5.0)

## 2015-04-24 LAB — C-REACTIVE PROTEIN: CRP: 5.5 mg/dL — ABNORMAL HIGH (ref <1.0)

## 2015-04-24 MED ORDER — OXYCODONE HCL 5 MG PO TABS: 5.0000 mg | ORAL_TABLET | ORAL | Status: DC | PRN

## 2015-04-24 MED ORDER — CIPROFLOXACIN HCL 500 MG PO TABS: 500.0000 mg | ORAL_TABLET | ORAL | Status: DC

## 2015-04-24 NOTE — Care Management Note (Addendum)
Case Management Note  Patient Details  Name: Gerald Davenport MRN: [id number] Date of Birth: 06-Sep-1941  Subjective/Objective:   Pt admitted with infection to dialysis vascular access                 Action/Plan:  Pt is independent from home with family support.  Pt will discharge home today 04/24/15 with wound vac and HHRN for wound vac dressing changes.  CM will assist with discharge wound vac and [facility] services.     Expected Discharge Date:                  Expected Discharge Plan:  [place]  In-House Referral:     Discharge planning Services  CM Consult  Post Acute Care Choice:    Choice offered to:     DME Arranged:   (wound vac) DME Agency:  [facility] Arranged:  RN [facility] Agency:  [place]  Status of Service:  Completed, signed off  Medicare Important Message Given:  Yes-second notification given Date Medicare IM Given:    Medicare IM give by:    Date Additional Medicare IM Given:    Additional Medicare Important Message give by:     If discussed at [place] of Stay Meetings, dates discussed:    Additional Comments: Pt will be discharged home with [facility] Wound Vac.  MD agreed to : [facility] negative pressure therapy, 120 continuous suction, wet to dry dressing applied by bedside nurse for discharge, pt will have foam as opposed to guaze for wound vac, wound vac site is actually upper right arm. Pt provided choice for HH, pt chose [facility] for both [facility] and DME, advanced liaison Jermaine accepted referral, wound vac will be applied by [facility] RN today post discharge,  Both liaisons made aware of discharge date of today.  CM contacted Santiago Glad with [place], referral was accepted.  Pt address and both home and cell phone number verified against Epic.  No other CM needs at this time Maryclare Labrador, RN 04/24/2015, 11:03 AM

## 2015-04-24 NOTE — Progress Notes (Signed)
Pt wound vac removed and wet to dry dsg applied. Delia Heady RN

## 2015-04-24 NOTE — Progress Notes (Addendum)
[facility] Progress Note  Subjective  - POD #5  No complaints. Wants to go home.   Objective Filed Vitals:   04/24/15 0344  BP: 149/53  Pulse: 77  Temp: 98.4 F (36.9 C)  Resp: 18    Intake/Output Summary (Last 24 hours) at 04/24/15 0730 Last data filed at 04/24/15 0500  Gross per 24 hour  Intake   1420 ml  Output      0 ml  Net   1420 ml    Right arm wound VAC seal intact. Staple lines clean and intact.   Assessment/Planning: 74 y.o. male is s/p: excision of right upper arm graft  5 Days Post-Op   C&S back. Will d/c vanc/zosyn and start cipro.  Wound VAC placed yesterday. Will need to arrange for wound vac. Home when home VAC is arranged.   Alvia Grove 04/24/2015 7:30 AM --  Laboratory CBC    Component Value Date/Time   WBC 8.6 04/23/2015 0753   HGB 8.0* 04/23/2015 0753   HCT 24.2* 04/23/2015 0753   PLT 393 04/23/2015 0753    BMET    Component Value Date/Time   NA 126* 04/23/2015 0800   K 3.4* 04/23/2015 0800   CL 92* 04/23/2015 0800   CO2 24 04/23/2015 0800   GLUCOSE 118* 04/23/2015 0800   BUN 35* 04/23/2015 0800   CREATININE 9.10* 04/23/2015 0800   CREATININE 9.19* 02/06/2015 1206   CALCIUM 7.7* 04/23/2015 0800   GFRNONAA 5* 04/23/2015 0800   GFRAA 6* 04/23/2015 0800    COAG No results found for: INR, PROTIME No results found for: PTT  Antibiotics Anti-infectives    Start     Dose/Rate Route Frequency Ordered Stop   04/22/15 1400  ciprofloxacin (CIPRO) tablet 500 mg     500 mg Oral Every 24 hours 04/22/15 1123     04/21/15 1215  vancomycin (VANCOCIN) 500 mg in sodium chloride 0.9 % 100 mL IVPB     500 mg 100 mL/hr over 60 Minutes Intravenous NOW 04/21/15 1149 04/21/15 1312   04/20/15 2145  vancomycin (VANCOCIN) 500 mg in sodium chloride 0.9 % 100 mL IVPB  Status:  Discontinued     500 mg 100 mL/hr over 60 Minutes Intravenous  Once 04/20/15 2132 04/21/15 1149   04/20/15 1200  vancomycin (VANCOCIN) IVPB 750  mg/150 ml premix  Status:  Discontinued     750 mg 150 mL/hr over 60 Minutes Intravenous Every M-W-F (Hemodialysis) 04/19/15 2112 04/22/15 1122   04/19/15 2200  piperacillin-tazobactam (ZOSYN) IVPB 2.25 g  Status:  Discontinued     2.25 g 100 mL/hr over 30 Minutes Intravenous 3 times per day 04/19/15 2112 04/22/15 1122   04/19/15 2115  vancomycin (VANCOCIN) 1,500 mg in sodium chloride 0.9 % 500 mL IVPB     1,500 mg 250 mL/hr over 120 Minutes Intravenous  Once 04/19/15 2112 04/20/15 0059   04/19/15 1814  vancomycin (VANCOCIN) 1 GM/200ML IVPB    Comments:  Sammuel Cooper   : cabinet override      04/19/15 1814 04/20/15 0629       Virgina Jock, PA-C [facility] Office: [phone] Pager: [phone] 04/24/2015 7:30 AM   Addendum  I have independently interviewed and examined the patient, and I agree with the physician assistant's findings.  Wound C&S growing: CITROBACTER FREUNDII most sensitive to Cipro. Ok to D/C once home VAC setup.  Pt's nutrition labs are consistent with Protein Malnutrition.  Alb 2.4 Pre-albumin 21.0  Adele Barthel, MD,  [facility] [place] Office: [phone] Pager: [phone]  04/24/2015, 7:47 AM

## 2015-04-24 NOTE — Discharge Summary (Signed)
Discharge Summary    Gerald Davenport 04-02-1941 74 y.o. male  [id number]  Admission Date: 04/19/2015  Discharge Date: 04/24/15  Physician: Conrad Baskin, MD  Admission Diagnosis: inf graft infected right arm AVG   HPI:   This is a 74 y.o. male who presents to the [facility] ED with 2 week history of purulent drainage from his right upper arm graft (placed on 11/20/11 by Dr. Scot Dock). This graft was last cannulated three weeks ago but was felt to be clotted. Subsequently the patient went to [facility] for a fistula study and insertion of left neck IJ tunneled dialysis catheter. He has been dialyzing via the catheter (MWF). His primary nephrologist is Dr. Lorrene Reid. He denies any fever or chills, nausea or vomiting.   He has a past medical history of hypertension, hyperlipidemia and diabetes. He is on not on any anticoagulation.   Hospital Course:  The patient was admitted to the hospital and taken to the operating room on 04/19/2015 and underwent: Excision of RUA AVG.    Intraoperative findings as follows: 1. Stent erosion through skin 2. Phlegmon in axilla surrounding infected Viabahn stented graft segment   The pt tolerated the procedure well and was transported to the PACU in good condition.   On POD 1:  Cont wide spectrum Abx: Vanc, Zosyn until C&S available  Pt had a phlegmon in R axilla that resulted in necrosis of the R axillary vein.   Fortunately, there was no back bleeding when I pulled out the Viabahn stents x 2 from the graft and axillary vein, but the vein closure is suspect due the presence of necrotic tissue.   No evidence of motor or sensation loss in R arm.  On POD 2, the dressing and packing was removed from the right arm.  Wound bed was clean and dry and wound was repacked with wet to dry dressing.   On POD 3, a wound vac was placed to the wound.  Home health is set up for wound vac changes T/T/S as the pt dialyzes M/W/F.    The wound C&S:  Wound C&S  growing: CITROBACTER FREUNDII most sensitive to Cipro and will be continued x 6 days after discharge per Dr. Bridgett Larsson.  The pt does have Albumin of 2.4 and pre-albumin of 21 and is consistent with protein malnutrition.    The remainder of the hospital course consisted of increasing mobilization and increasing intake of solids without difficulty.  CBC    Component Value Date/Time   WBC 8.6 04/23/2015 0753   RBC 2.40* 04/23/2015 0753   HGB 8.0* 04/23/2015 0753   HCT 24.2* 04/23/2015 0753   PLT 393 04/23/2015 0753   MCV 100.8* 04/23/2015 0753   MCH 33.3 04/23/2015 0753   MCHC 33.1 04/23/2015 0753   RDW 18.4* 04/23/2015 0753   LYMPHSABS 1.1 04/23/2015 0753   MONOABS 0.6 04/23/2015 0753   EOSABS 0.3 04/23/2015 0753   BASOSABS 0.0 04/23/2015 0753    BMET    Component Value Date/Time   NA 126* 04/23/2015 0800   K 3.4* 04/23/2015 0800   CL 92* 04/23/2015 0800   CO2 24 04/23/2015 0800   GLUCOSE 118* 04/23/2015 0800   BUN 35* 04/23/2015 0800   CREATININE 9.10* 04/23/2015 0800   CREATININE 9.19* 02/06/2015 1206   CALCIUM 7.7* 04/23/2015 0800   GFRNONAA 5* 04/23/2015 0800   GFRAA 6* 04/23/2015 0800      Discharge Instructions    Call MD for:  redness, tenderness, or  signs of infection (pain, swelling, bleeding, redness, odor or green/yellow discharge around incision site)    Complete by:  As directed      Call MD for:  severe or increased pain, loss or decreased feeling  in affected limb(s)    Complete by:  As directed      Call MD for:  temperature >100.5    Complete by:  As directed      Discharge wound care:    Complete by:  As directed   Wound vac changes T/T/S per home health.     Driving Restrictions    Complete by:  As directed   No driving for 2 weeks     Lifting restrictions    Complete by:  As directed   No lifting for 2 weeks     Resume previous diet    Complete by:  As directed            Discharge Diagnosis:  inf graft infected right arm AVG  Secondary  Diagnosis: Patient Active Problem List   Diagnosis Date Noted  . Infection, dialysis vascular access 04/19/2015  . Rectal bleeding 02/06/2015  . SOB (shortness of breath) 03/24/2014  . Abnormal EKG 03/24/2014  . Essential hypertension, benign 03/15/2014  . Mixed hyperlipidemia 03/15/2014  . H/O adenomatous polyp of colon 11/07/2013  . Constipation 06/15/2013  . Encounter for screening colonoscopy 11/02/2012  . Fecal incontinence 09/10/2012  . GERD (gastroesophageal reflux disease) 09/10/2012  . Diabetes mellitus 08/06/2011  . CRF (chronic renal failure) 08/06/2011   Past Medical History  Diagnosis Date  . Hypertension   . Hyperlipidemia   . Chronic kidney disease     dialysis Tues, Thur, Sat  . Diabetes mellitus   . Cancer     Prostate cancer treated with radiation therapy  . Thyroid disease     hyperparathyroidism  . Anemia   . Positive PPD   . Clot 05/2006    Arterial clot right leg   . Hemodialysis patient 6/13  . Diverticulosis   . Anxiety        Medication List    TAKE these medications        aspirin 81 MG tablet  Take 81-162 mg by mouth 2 (two) times daily.     buPROPion 150 MG 24 hr tablet  Commonly known as:  WELLBUTRIN XL  Take 150 mg by mouth daily.     calcium acetate 667 MG capsule  Commonly known as:  PHOSLO  Take 2,001 mg by mouth 2 (two) times daily.     ciprofloxacin 500 MG tablet  Commonly known as:  CIPRO  Take 1 tablet (500 mg total) by mouth daily.     cloNIDine 0.2 MG tablet  Commonly known as:  CATAPRES  Take 0.2 mg by mouth 3 (three) times daily.     doxazosin 8 MG tablet  Commonly known as:  CARDURA  Take 8 mg by mouth at bedtime.     furosemide 40 MG tablet  Commonly known as:  LASIX  Take 40 mg by mouth 2 (two) times daily.     gabapentin 100 MG capsule  Commonly known as:  NEURONTIN  Take 100 mg by mouth 2 (two) times daily.     insulin detemir 100 UNIT/ML injection  Commonly known as:  LEVEMIR  Inject 20 Units  into the skin at bedtime.     Linaclotide 145 MCG Caps capsule  Commonly known as:  LINZESS  Take 1 capsule (145  mcg total) by mouth daily. Take 30 minutes before breakfast     ONGLYZA 5 MG Tabs tablet  Generic drug:  saxagliptin HCl  Take 5 mg by mouth daily.     oxyCODONE 5 MG immediate release tablet  Commonly known as:  Oxy IR/ROXICODONE  Take 1 tablet (5 mg total) by mouth every 4 (four) hours as needed for moderate pain.     QUEtiapine 100 MG tablet  Commonly known as:  SEROQUEL  Take 100 mg by mouth at bedtime.     simvastatin 20 MG tablet  Commonly known as:  ZOCOR  Take 20 mg by mouth at bedtime.     Vitamin D (Ergocalciferol) 50000 UNITS Caps capsule  Commonly known as:  DRISDOL  Take 50,000 Units by mouth every 7 (seven) days. mondays        Prescriptions given: 1.  Roxicodone #30 No Refill 2.  Cipro 500 mg daily #6 NR  Instructions: 1.  Wound vac change T/T/S per home health  Disposition: home  Patient's condition: is Good  Follow up: 1. Dr. Bridgett Larsson in 2 weeks   Leontine Locket, PA-C [facility] [phone] 04/24/2015  10:32 AM  Addendum  I have independently interviewed and examined the patient, and I agree with the physician assistant's findings.  Pt underwent an excision of infected right upper arm arteriovenous graft.  From the intra-operative findings, a sent strut had eroded through the upper portion of the arteriovenous graft.  I removed two infected Viabahn stent from the graft that had been placed by Interventional Nephrology.  There was a large phlegmon in the right axilla.  The anterior wall of the axilla vein was necrotic.  I closed the vein with large bites into the inflamed tissue surrounding the vein.  There was no backbleeding from the vein on exploration.   His recovery was uneventful and the patient was started on VAC dressing for management of the open wound.  His wound cultures reveal a bacteria sensitive to oral Cipro.   The patient was discharged with Cipro for another 6 days and wound VAC for 2 weeks.  I will see him back in the office in 2 weeks.   Adele Barthel, MD [facility] [place] Office: [phone] Pager: [phone]  04/25/2015, 8:16 AM

## 2015-04-24 NOTE — Progress Notes (Signed)
Subjective:  Wound vac inplace, possible dc later today  Objective Vital signs in last 24 hours: Filed Vitals:   04/23/15 2005 04/24/15 0339 04/24/15 0344 04/24/15 0943  BP: 139/55  149/53 139/44  Pulse: 83  77 81  Temp: 97.6 F (36.4 C)  98.4 F (36.9 C)   TempSrc: Oral  Oral   Resp: 20  18 18   Height:      Weight:  71.6 kg (157 lb 13.6 oz)    SpO2: 100%  100% 100%   Weight change:   Physical Exam: General: alert NAD,OX3 nl MS  Heart: RRR  No , mur or gallop Lungs: CTA Abdomen: soft NT.ND Extremities:R U Arm Dressing dry  intact / no pedal edema L  Or R stump bka/ R BKA  Dialysis Access: R IJ perm cath patent on HD grn olc  Op Dialysis Orders: Center: [facility] on MWF . EDW 69.5 HD Bath 2.0 K 2.5 Ca 1 mg Time 4 hours Heparin 3000 units per treatment. Access L perm cath BFR 400 DFR Autoflow 1.5  Mircera 100 mcg q 2 weeks (last dose 04/01/15)  Problem/Plan: 1. Infected RUE AVG=VVS excised 7/22=Wound Cx citrobacter freundii , abx changed to cipro po. Home w wound VAC when it can be set up.  2. 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 HTN/vol  Stable bp and vol no change in edw with wts inaccurate in hosp 4. Anemia - HGB down to 8.0 sec surg /l .will give dose Aranesp today fu op/ esa ^ op  Fu labs per proto cal   5. Secondary hyperparathyroidism - stable ca phos on 2.5 ca bath  Ca correc=9.2 phos 4.5   Kelly Splinter MD pager [phone]    cell [phone] 04/24/2015, 9:50 AM    Labs: Basic Metabolic Panel:  Recent Labs Lab 04/19/15 1601 04/19/15 2240 04/20/15 0410 04/23/15 0800  NA 130*  --  129* 126*  K 4.4  --  5.2* 3.4*  CL 92*  --  91* 92*  CO2 26  --  25 24  GLUCOSE 337*  --  213* 118*  BUN 35*  --  37* 35*  CREATININE 8.59* 9.15* 9.55* 9.10*  CALCIUM 8.3*  --  7.8* 7.7*  PHOS  --   --   --  4.5   Liver Function Tests:  Recent Labs Lab 04/23/15 0800 04/24/15 0320  ALBUMIN 2.1* 2.4*  CBC:  Recent Labs Lab  04/19/15 1601 04/19/15 2240 04/20/15 0410 04/23/15 0348 04/23/15 0753  WBC 11.1* 15.0* 10.0 9.1 8.6  NEUTROABS 9.0*  --   --   --  6.5  HGB 9.4* 10.2* 9.6* 8.5* 8.0*  HCT 29.6* 31.5* 30.1* 25.6* 24.2*  MCV 103.9* 103.3* 103.8* 101.2* 100.8*  PLT 375 374 253 395 393   Cardiac Enzymes: No results for input(s): CKTOTAL, CKMB, CKMBINDEX, TROPONINI in the last 168 hours. CBG:  Recent Labs Lab 04/23/15 0615 04/23/15 1219 04/23/15 1628 04/23/15 2121 04/24/15 0610  GLUCAP 161* 134* 286* 175* 270*    Studies/Results: No results found. Medications:   . aspirin EC  81 mg Oral BID  . buPROPion  150 mg Oral Daily  . calcium acetate  1,334 mg Oral BID WC  . ciprofloxacin  500 mg Oral Q24H  . cloNIDine  0.2 mg Oral TID  . darbepoetin (ARANESP) injection - DIALYSIS  100 mcg Intravenous Q Mon-HD  . doxazosin  8 mg Oral QHS  . feeding supplement (NEPRO CARB STEADY)  237 mL Oral BID BM  . furosemide  40 mg Oral BID  . gabapentin  100 mg Oral BID  . heparin  5,000 Units Subcutaneous 3 times per day  . insulin aspart  0-15 Units Subcutaneous TID WC  . insulin detemir  20 Units Subcutaneous QHS  . Linaclotide  145 mcg Oral QAC breakfast  . linagliptin  5 mg Oral Daily  . pantoprazole  40 mg Oral QHS  . potassium chloride  20-40 mEq Oral Once  . QUEtiapine  100 mg Oral QHS  . simvastatin  20 mg Oral QHS  . sodium chloride  3 mL Intravenous Q12H  . Vitamin D (Ergocalciferol)  50,000 Units Oral Q7 days

## 2015-04-24 NOTE — Progress Notes (Signed)
Pt discharge education and instructions completed with pt and family at side; both voices understanding and denies any questions. Pt IV and telemetry removed; pt discharge home with family to transport him home. Pt handed his prescriptions for oxycodone and ciprofloxacin; pt dsg clean, dry and intact; pt transported off unit via wheelchair with family and belongings to the side. Francis Gaines Nastasia Kage RN.

## 2015-04-24 NOTE — Progress Notes (Signed)
UR Completed. Tichina Koebel, RN, BSN.  [phone] 

## 2015-04-25 LAB — ANAEROBIC CULTURE: Gram Stain: NONE SEEN

## 2015-04-26 ENCOUNTER — Telehealth: Payer: Self-pay | Admitting: Vascular Surgery

## 2015-04-26 ENCOUNTER — Telehealth: Payer: Self-pay

## 2015-04-26 NOTE — Telephone Encounter (Signed)
Rec'd phone call from [facility] RN.  Reported pt. has a "tender, hot, swollen, hard knot" beside axilla incision, draining a clear, bloody fluid.  Stated the pt. c/o increased pain at site.  Denied fever.  Reported pt. hadn't started the antibiotic yet, as he just got it filled at the pharmacy.  Reported "the wound @ wound vac site looks okay."  Appt. given for 9:45 AM.  Wife notified and agreed with appt. time/date.

## 2015-04-26 NOTE — Telephone Encounter (Signed)
-----   Message from Mena Goes, RN sent at 04/24/2015 10:48 AM EDT ----- Regarding: Schedule   ----- Message -----    From: Gabriel Earing, PA-C    Sent: 04/24/2015  10:32 AM      To: [facility] Charge Pool  Removal RUA AVG.  F/u with Dr. Bridgett Larsson in 2 weeks.  Thanks, Aldona Bar

## 2015-04-27 ENCOUNTER — Ambulatory Visit (INDEPENDENT_AMBULATORY_CARE_PROVIDER_SITE_OTHER): Payer: Self-pay | Admitting: Vascular Surgery

## 2015-04-27 ENCOUNTER — Other Ambulatory Visit (HOSPITAL_COMMUNITY): Payer: Medicare PPO

## 2015-04-27 ENCOUNTER — Ambulatory Visit: Payer: Medicare PPO | Admitting: Vascular Surgery

## 2015-04-27 ENCOUNTER — Encounter: Payer: Self-pay | Admitting: Vascular Surgery

## 2015-04-27 ENCOUNTER — Encounter (HOSPITAL_COMMUNITY): Payer: Medicare PPO

## 2015-04-27 VITALS — BP 164/69 | HR 81 | Temp 97.8°F | Resp 18 | Ht 68.0 in | Wt 171.0 lb

## 2015-04-27 DIAGNOSIS — T827XXD Infection and inflammatory reaction due to other cardiac and vascular devices, implants and grafts, subsequent encounter: Secondary | ICD-10-CM

## 2015-04-27 NOTE — Progress Notes (Signed)
    Postoperative Access Visit   History of Present Illness  Gerald Davenport is a 74 y.o. year old male who presents for postoperative follow-up for: bump in R arm pit.  Pt is s/p RUA AVG excision and removal of infected Viabahn stents (Date: 04/19/15).  The patient's wounds are not healed.  He continues to have VAC changes, reported his next change in this coming Sunday.  Reported he had a painful bump in his R armpit but this spontaneous resolved.  He denies any drainage from the axillary incision.  For VQI Use Only  PRE-ADM LIVING: Home  AMB STATUS: Ambulatory  Physical Examination Filed Vitals:   04/27/15 0952  BP: 164/69  Pulse: 81  Temp:   Resp:     RUE: arterial exposure Incision is c/d/i, mid-segment incision has adherent VAC, axillary incision is intact with some macerated skin without any drainage from staple line, some fullness palpable in graft tract, no mass in axilla, palpable axillary artery, no pseudoaneurysm obviously palpable, sensation in digits is intact,  Medical Decision Making  Gerald Davenport is a 74 y.o. year old male who presents s/p RUA AVG excision for infected AVG and Viabahn stents .  The tract between the mid-segment incision and axilla is open.  I suspect any blood that was accumulating in the arm pit was evacuated by the [facility] dressing, as I don't feel any mass in the axilla currently.  There was a phlegmon in the right axilla, so continued observation for bleeding from the axilla is recommended.    Continue VAC dressing  Follow up 1-2 weeks for wound check.  Thank you for allowing [place] to participate in this patient's care.  Adele Barthel, MD [facility] [place] Office: [phone] Pager: [phone]  04/27/2015, 10:37 AM

## 2015-04-30 ENCOUNTER — Encounter: Payer: Self-pay | Admitting: Nephrology

## 2015-05-10 ENCOUNTER — Encounter: Payer: Self-pay | Admitting: Nurse Practitioner

## 2015-05-10 ENCOUNTER — Encounter: Payer: Self-pay | Admitting: Vascular Surgery

## 2015-05-10 ENCOUNTER — Ambulatory Visit (INDEPENDENT_AMBULATORY_CARE_PROVIDER_SITE_OTHER): Payer: Medicare PPO | Admitting: Nurse Practitioner

## 2015-05-10 VITALS — BP 129/61 | HR 92 | Temp 96.8°F | Ht 68.0 in | Wt 160.8 lb

## 2015-05-10 DIAGNOSIS — K625 Hemorrhage of anus and rectum: Secondary | ICD-10-CM

## 2015-05-10 DIAGNOSIS — K5900 Constipation, unspecified: Secondary | ICD-10-CM

## 2015-05-10 MED ORDER — DOCUSATE SODIUM 100 MG PO CAPS: 100.0000 mg | ORAL_CAPSULE | Freq: Two times a day (BID) | ORAL | Status: DC

## 2015-05-10 NOTE — Assessment & Plan Note (Signed)
Patient with chronic constipation which is currently nearly optimally controlled on Linzess. He is having a bowel movement every other day which is acceptable for him, however his stools are a little bit harder than you would like. At this point we will add daily Colace to help for stool softening to help for easier passage of stools. We'll have him return in 3 months for further evaluation. Colonoscopy is up-to-date.

## 2015-05-10 NOTE — Progress Notes (Signed)
Referring Provider: Alroy Dust, L.Marlou Sa, MD Primary Care Physician:  Donnie Coffin, MD Primary GI:  Dr. Gala Romney  Chief Complaint  Patient presents with  . Constipation    HPI:   74 year old male presents for follow-up on rectal bleeding and constipation. At last visit 3 months ago he admitted a single episode of constipation associated rectal bleeding. Did not have any subsequent episodes. Has a history of radiation-induced proctitis and friable mucosa. Colonoscopy is up-to-date (2015)  and is next due in 2020 (5 years.) At his last visit his Linzess was refilled and recommended 3 month follow-up. His CBC has declined in the past 1 or 2 drawings including approximately 2 weeks ago where his hemoglobin was 8.0, however he was recently seen for excision of an infected stent by vascular surgery with subsequent bleeding and wound VAC. Given his previously stable hemoglobin this is likely the source of his recent decline.  Today he states he is currently having a bowel movement every other day. Is still taking Linzess except for dialysis days. His stools range from [facility] 3-5. Occasionally has some straining. Denies hematochezia Recently had infected stent in his RUE removed and has a wound vac in place. Denies chest pain, dyspnea, dizziness, lightheadedness, syncope, near syncope. Denies any other upper or lower GI symptoms.  Past Medical History  Diagnosis Date  . Hypertension   . Hyperlipidemia   . Chronic kidney disease     dialysis Tues, Thur, Sat  . Diabetes mellitus   . Cancer     Prostate cancer treated with radiation therapy  . Thyroid disease     hyperparathyroidism  . Anemia   . Positive PPD   . Clot 05/2006    Arterial clot right leg   . Hemodialysis patient 6/13  . Diverticulosis   . Anxiety     Past Surgical History  Procedure Laterality Date  . 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 Av fistula placement  11/04/2010    left and left  . Av fistula placement   11/20/2011    Procedure: INSERTION OF ARTERIOVENOUS (AV) GORE-TEX GRAFT ARM;  Surgeon: Angelia Mould, MD;  Location: [facility] OR;  Service: Vascular;  Laterality: Right;  . Colonoscopy N/A 11/10/2012    GEZ:[id number] proctitis/Pancolonic diverticulosis/(TUBULAR ADENOMA)5 mm polyp in the transverse segment/4 mm polyps in the base of the cecum and diminutive polyp in the descending segment. INADEQUATE BOWEL PREP  . Colonoscopy N/A 11/28/2013    RMR: Radiation proctitis. Colonic diverticulosis. Colonic polyps-removed as described above.   [name] N/A 10/08/2012    Procedure: [name];  Surgeon: Elam Dutch, MD;  Location: [facility] CATH LAB;  Service: Cardiovascular;  Laterality: N/A;  . Av fistula placement Right 04/19/2015    Procedure: Excision of infected RUA AVG;  Surgeon: Conrad St. Hilaire, MD;  Location: [facility] OR;  Service: Vascular;  Laterality: Right;    Current Outpatient Prescriptions  Medication Sig Dispense Refill  . aspirin 81 MG tablet Take 81-162 mg by mouth 2 (two) times daily.     Marland Kitchen buPROPion (WELLBUTRIN XL) 150 MG 24 hr tablet Take 150 mg by mouth daily.    . calcium acetate (PHOSLO) 667 MG capsule Take 2,001 mg by mouth 2 (two) times daily.     . cloNIDine (CATAPRES) 0.2 MG tablet Take 0.2 mg by mouth 3 (three) times daily.     Marland Kitchen doxazosin (CARDURA) 8 MG tablet Take 8 mg by mouth at bedtime.     Marland Kitchen  furosemide (LASIX) 40 MG tablet Take 40 mg by mouth 2 (two) times daily.      Marland Kitchen gabapentin (NEURONTIN) 100 MG capsule Take 100 mg by mouth 2 (two) times daily.    . insulin detemir (LEVEMIR) 100 UNIT/ML injection Inject 20 Units into the skin at bedtime.     . Linaclotide (LINZESS) 145 MCG CAPS capsule Take 1 capsule (145 mcg total) by mouth daily. Take 30 minutes before breakfast (Patient taking differently: Take 145 mcg by mouth daily. Take 30 minutes before breakfast/ Cannot take on the days that he goes to dialysis) 30 capsule 11  . omeprazole (PRILOSEC) 20 MG capsule     .  QUEtiapine (SEROQUEL) 100 MG tablet Take 100 mg by mouth at bedtime.    . saxagliptin HCl (ONGLYZA) 5 MG TABS tablet Take 5 mg by mouth daily.    . simvastatin (ZOCOR) 20 MG tablet Take 20 mg by mouth at bedtime.      . Vitamin D, Ergocalciferol, (DRISDOL) 50000 UNITS CAPS Take 50,000 Units by mouth every 7 (seven) days. mondays     No current facility-administered medications for this visit.    Allergies as of 05/10/2015  . (No Known Allergies)    Family History  Problem Relation Age of Onset  . Heart disease Mother   . Diabetes Mother   . Hypertension Mother   . Stroke Mother   . Heart disease Father   . Diabetes Father   . Heart disease Sister   . Diabetes Brother   . Colon cancer Neg Hx     Social History   Social History  . Marital Status: Married    Spouse Name: N/A  . Number of Children: N/A  . Years of Education: N/A   Social History Main Topics  . Smoking status: Current Every Day Smoker -- 1.00 packs/day for 50 years    Types: Cigarettes  . Smokeless tobacco: Never Used     Comment: 2-3 cigarettes daily  . Alcohol Use: No  . Drug Use: No  . Sexual Activity: Not Asked   Other Topics Concern  . None   Social History Narrative    Review of Systems: General: Negative for anorexia, weight loss, fever, chills, fatigue, weakness. CV: Negative for chest pain, angina, palpitations, dyspnea on exertion, peripheral edema.  Respiratory: Negative for dyspnea at rest, dyspnea on exertion, cough, sputum, wheezing.  GI: See history of present illness. Endo: Negative for unusual weight change.  Heme: Negative for bruising or bleeding.   Physical Exam: BP 129/61 mmHg  Pulse 92  Temp(Src) 96.8 F (36 C) (Oral)  Ht 5\' 8"  (1.727 m)  Wt 160 lb 12.8 oz (72.938 kg)  BMI 24.46 kg/m2 General:   Alert and oriented. Pleasant and cooperative. Well-nourished and well-developed.  Head:  Normocephalic and atraumatic. Eyes:  Without icterus, sclera clear and conjunctiva  pink.  Ears:  Normal auditory acuity. Cardiovascular:  S1, S2 present without murmurs appreciated. Normal pulses noted. Extremities without clubbing or edema. Respiratory:  Clear to auscultation bilaterally. No wheezes, rales, or rhonchi. No distress.  Gastrointestinal:  +BS, soft, non-tender and non-distended. No HSM noted. No guarding or rebound. No masses appreciated.  Rectal:  Deferred  Neurologic:  Alert and oriented x4;  grossly normal neurologically. Psych:  Alert and cooperative. Normal mood and affect. Heme/Lymph/Immune: No excessive bruising noted.    05/10/2015 1:55 PM

## 2015-05-10 NOTE — Progress Notes (Signed)
CC'ED TO PCP 

## 2015-05-10 NOTE — Assessment & Plan Note (Signed)
Patient has had no recurrence of rectal bleeding since last visit. Again, his previous single episode was likely due to proctitis induced by radiation and setting of constipation. He is currently taking his Linzess. Return for follow-up in 3 months.

## 2015-05-10 NOTE — Patient Instructions (Signed)
1. Continue your current medications. 2. Start taking Colace (an over-the-counter stool softener available at any pharmacy) once a day. 3. Return for follow-up in 3 months.

## 2015-05-11 ENCOUNTER — Encounter: Payer: Self-pay | Admitting: Vascular Surgery

## 2015-05-11 ENCOUNTER — Encounter: Payer: Medicare PPO | Admitting: Vascular Surgery

## 2015-05-11 ENCOUNTER — Ambulatory Visit (INDEPENDENT_AMBULATORY_CARE_PROVIDER_SITE_OTHER): Payer: Self-pay | Admitting: Vascular Surgery

## 2015-05-11 VITALS — BP 158/73 | HR 98 | Temp 97.4°F | Ht 68.0 in | Wt 157.6 lb

## 2015-05-11 DIAGNOSIS — Z992 Dependence on renal dialysis: Secondary | ICD-10-CM

## 2015-05-11 DIAGNOSIS — N186 End stage renal disease: Secondary | ICD-10-CM

## 2015-05-11 NOTE — Progress Notes (Signed)
POST OPERATIVE OFFICE NOTE    CC:  F/u for surgery  HPI:  This is a 74 y.o. male who presented to [facility] ED with 2 week hx of purulent drainage from his right upper arm graft that was placed on 11/20/11 by Dr. Scot Dock.  This graft was last cannulated three weeks prior to admission but was felt to be clotted. Subsequently the patient went to [facility] for a fistula study and insertion of left neck IJ tunneled dialysis catheter. He has been dialyzing via the catheter (MWF). His primary nephrologist is Dr. Lorrene Reid. He denies any fever or chills, nausea or vomiting.   He has a past medical history of hypertension, hyperlipidemia and diabetes. He is on not on any anticoagulation.   On 04/19/15, he underwent removal of LUA AVG and was placed on broad spectrum ABx.  On POD 2, the packing was removed.  The wound was clean and dry.  A wound vac was set up for the pt and was to be changed T/T/S as he dialyzes M/W/F.    The wound C&S: Wound C&S growing: CITROBACTER FREUNDII most sensitive to Cipro and will be continued x 6 days after discharge per Dr. Bridgett Larsson.  He does c/o pain on the bottom of his left foot.   No Known Allergies  Current Outpatient Prescriptions  Medication Sig Dispense Refill  . aspirin 81 MG tablet Take 81-162 mg by mouth 2 (two) times daily.     Marland Kitchen buPROPion (WELLBUTRIN XL) 150 MG 24 hr tablet Take 150 mg by mouth daily.    . calcium acetate (PHOSLO) 667 MG capsule Take 2,001 mg by mouth 2 (two) times daily.     . cloNIDine (CATAPRES) 0.2 MG tablet Take 0.2 mg by mouth 3 (three) times daily.     Marland Kitchen docusate sodium (COLACE) 100 MG capsule Take 1 capsule (100 mg total) by mouth 2 (two) times daily. 10 capsule 0  . doxazosin (CARDURA) 8 MG tablet Take 8 mg by mouth at bedtime.     . furosemide (LASIX) 40 MG tablet Take 40 mg by mouth 2 (two) times daily.      Marland Kitchen gabapentin (NEURONTIN) 100 MG capsule Take 100 mg by mouth 2 (two) times daily.    . insulin detemir (LEVEMIR) 100 UNIT/ML  injection Inject 20 Units into the skin at bedtime.     . Linaclotide (LINZESS) 145 MCG CAPS capsule Take 1 capsule (145 mcg total) by mouth daily. Take 30 minutes before breakfast (Patient taking differently: Take 145 mcg by mouth daily. Take 30 minutes before breakfast/ Cannot take on the days that he goes to dialysis) 30 capsule 11  . omeprazole (PRILOSEC) 20 MG capsule     . QUEtiapine (SEROQUEL) 100 MG tablet Take 100 mg by mouth at bedtime.    . saxagliptin HCl (ONGLYZA) 5 MG TABS tablet Take 5 mg by mouth daily.    . simvastatin (ZOCOR) 20 MG tablet Take 20 mg by mouth at bedtime.      . Vitamin D, Ergocalciferol, (DRISDOL) 50000 UNITS CAPS Take 50,000 Units by mouth every 7 (seven) days. mondays     No current facility-administered medications for this visit.     ROS:  See HPI  Physical Exam:  Filed Vitals:   05/11/15 1401  BP: 158/73  Pulse:   Temp:     Incision:  Axillary and antecubital space incisions are healed with staples in tact.  The mid upper arm wound has excellent granulation tissue and is  1-43mm from the surface of the skin Extremities:  Motor and sensation are in tact left hand.  Left foot has a darkened area at the tip of the great toe.  Otherwise, there are no non healing wounds.   Assessment/Plan:  This is a 74 y.o. male who is s/p: Removal of LUA AVG with subsequent placement of wound vac.  -pt's axillary and antecubital space incisions are healed with staples in tact.  Will remove the staples today -the mid upper arm wound has excellent granulation tissue and is 1-55mm from the skin surface.  Will continue with the wound vac for another 1-2 weeks and suspect this will be healed in that amount of time.   -we will see the pt back in 2 weeks for a wound check. -continue dialysis via left IJ catheter (placed by Dr. Augustin Coupe) -will not plan for new permanent access until this wound has completely healed.   Leontine Locket, PA-C [facility] [phone]  Clinic MD:  Pt seen and examined with Dr. Bridgett Larsson  Addendum  I have independently interviewed and examined the patient, and I agree with the physician assistant's findings.  Both arterial and venous anastomosis exposures are healed, mid-segment wound is granulating well and nearly at skin level.  Cont wound VAC x 2 weeks.  Suspect pt will be healed in another month.  Follow up in 2 weeks.  D/C staples at this point.   Adele Barthel, MD [facility] [place] Office: [phone] Pager: [phone]  05/11/2015, 4:07 PM

## 2015-05-14 NOTE — Addendum Note (Signed)
Addended by: Dorthula Rue L on: 05/14/2015 10:15 AM   Modules accepted: Orders

## 2015-05-24 ENCOUNTER — Encounter: Payer: Self-pay | Admitting: Vascular Surgery

## 2015-05-25 ENCOUNTER — Ambulatory Visit (HOSPITAL_COMMUNITY)
Admission: RE | Admit: 2015-05-25 | Discharge: 2015-05-25 | Disposition: A | Discharge status: Home / Self Care | Payer: Medicare PPO | Source: Ambulatory Visit | Attending: Vascular Surgery | Admitting: Vascular Surgery

## 2015-05-25 ENCOUNTER — Encounter: Payer: Self-pay | Admitting: Vascular Surgery

## 2015-05-25 ENCOUNTER — Ambulatory Visit (INDEPENDENT_AMBULATORY_CARE_PROVIDER_SITE_OTHER): Payer: Self-pay | Admitting: Vascular Surgery

## 2015-05-25 VITALS — BP 135/60 | HR 88 | Temp 98.1°F | Resp 16 | Ht 68.0 in | Wt 172.0 lb

## 2015-05-25 DIAGNOSIS — N186 End stage renal disease: Secondary | ICD-10-CM

## 2015-05-25 DIAGNOSIS — T827XXD Infection and inflammatory reaction due to other cardiac and vascular devices, implants and grafts, subsequent encounter: Secondary | ICD-10-CM | POA: Diagnosis not present

## 2015-05-25 DIAGNOSIS — Z992 Dependence on renal dialysis: Secondary | ICD-10-CM | POA: Insufficient documentation

## 2015-05-25 DIAGNOSIS — Z89511 Acquired absence of right leg below knee: Secondary | ICD-10-CM | POA: Insufficient documentation

## 2015-05-25 DIAGNOSIS — E1140 Type 2 diabetes mellitus with diabetic neuropathy, unspecified: Secondary | ICD-10-CM | POA: Insufficient documentation

## 2015-05-25 NOTE — Progress Notes (Signed)
    Postoperative Access Visit   History of Present Illness  Gerald Davenport is a 74 y.o. year old male who presents for postoperative follow-up for: RUA AVG excision (Date: 04/19/15).  The patient's wounds are nearly healed.  The patient is able to complete their activities of daily living.  The patient's current symptoms are: L lower leg numbness.  He is here also for a L ABI.  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  For VQI Use Only  PRE-ADM LIVING: Home  AMB STATUS: Ambulatory  Physical Examination Filed Vitals:   05/25/15 1246  BP: 135/60  Pulse: 88  Temp: 98.1 F (36.7 C)  Resp: 16    RUE: wound nearly healed with small area of good granulation evident, skin feels warm, hand grip is 5/5, sensation in digits is intact, arterial and venous exposures are healed, no axillary fullness at this point  ABI (Date: 05/25/2015)  L: 0.71, DP: mono, PT: mono, TBI: 0.47   Medical Decision Making  Gerald Davenport is a 74 y.o. year old male who presents s/p RUA AVG excision, moderate PAD LLE, likely LLE diabetic neuropathy .  In regards to the RUA AVG, he is nearly healed.  Can discontinue VAC and continue with a bandaid to the small residual wound.  Follow up in 2 weeks for Vein mapping LUE and arterial doppler to determine access options in that arm  In regards to the L leg, he has moderate PAD at this point.  His ABI reflect some degree of medial calcification.  Waveforms demonstrate at least moderate degree of continued perfusion  Thank you for allowing [place] to participate in this patient's care.  Adele Barthel, MD [facility] [place] Office: [phone] Pager: [phone]  05/25/2015, 1:09 PM

## 2015-05-25 NOTE — Addendum Note (Signed)
Addended by: Dorthula Rue L on: 05/25/2015 04:21 PM   Modules accepted: Orders

## 2015-06-01 ENCOUNTER — Other Ambulatory Visit: Payer: Self-pay | Admitting: Physician Assistant

## 2015-06-12 ENCOUNTER — Ambulatory Visit (INDEPENDENT_AMBULATORY_CARE_PROVIDER_SITE_OTHER)
Admission: RE | Admit: 2015-06-12 | Discharge: 2015-06-12 | Disposition: A | Discharge status: Home / Self Care | Payer: Medicare PPO | Source: Ambulatory Visit | Attending: Vascular Surgery | Admitting: Vascular Surgery

## 2015-06-12 ENCOUNTER — Ambulatory Visit (HOSPITAL_COMMUNITY)
Admission: RE | Admit: 2015-06-12 | Discharge: 2015-06-12 | Disposition: A | Discharge status: Home / Self Care | Payer: Medicare PPO | Source: Ambulatory Visit | Attending: Vascular Surgery | Admitting: Vascular Surgery

## 2015-06-12 DIAGNOSIS — I82891 Chronic embolism and thrombosis of other specified veins: Secondary | ICD-10-CM | POA: Diagnosis not present

## 2015-06-12 DIAGNOSIS — Z01818 Encounter for other preprocedural examination: Secondary | ICD-10-CM | POA: Diagnosis present

## 2015-06-12 DIAGNOSIS — T827XXD Infection and inflammatory reaction due to other cardiac and vascular devices, implants and grafts, subsequent encounter: Secondary | ICD-10-CM | POA: Diagnosis not present

## 2015-06-13 ENCOUNTER — Encounter: Payer: Self-pay | Admitting: Vascular Surgery

## 2015-06-15 ENCOUNTER — Ambulatory Visit (INDEPENDENT_AMBULATORY_CARE_PROVIDER_SITE_OTHER): Payer: Self-pay | Admitting: Vascular Surgery

## 2015-06-15 ENCOUNTER — Encounter: Payer: Self-pay | Admitting: *Deleted

## 2015-06-15 ENCOUNTER — Encounter: Payer: Self-pay | Admitting: Vascular Surgery

## 2015-06-15 ENCOUNTER — Other Ambulatory Visit: Payer: Self-pay | Admitting: *Deleted

## 2015-06-15 VITALS — BP 202/95 | HR 103 | Temp 98.1°F | Ht 68.0 in | Wt 159.0 lb

## 2015-06-15 DIAGNOSIS — N186 End stage renal disease: Secondary | ICD-10-CM

## 2015-06-15 DIAGNOSIS — Z992 Dependence on renal dialysis: Secondary | ICD-10-CM

## 2015-06-15 NOTE — Progress Notes (Signed)
Established Dialysis Access  History of Present Illness  Gerald Davenport is a 74 y.o. ([birth year]) male who presents for re-evaluation for permanent access.  The patient is right hand dominant.  Previous access procedures have been completed in the right arm.  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  The patient has never had a previous PPM placed.  The patient denies any prior procedures.  His prior RUA incision has healed at this point.  Past Medical History  Diagnosis Date  . Hypertension   . Hyperlipidemia   . Chronic kidney disease     dialysis Tues, Thur, Sat  . Diabetes mellitus   . Cancer     Prostate cancer treated with radiation therapy  . Thyroid disease     hyperparathyroidism  . Anemia   . Positive PPD   . Clot 05/2006    Arterial clot right leg   . Hemodialysis patient 6/13  . Diverticulosis   . Anxiety     Past Surgical History  Procedure Laterality Date  . 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 Av fistula placement  11/04/2010    left and left  . Av fistula placement  11/20/2011    Procedure: INSERTION OF ARTERIOVENOUS (AV) GORE-TEX GRAFT ARM;  Surgeon: Angelia Mould, MD;  Location: [facility] OR;  Service: Vascular;  Laterality: Right;  . Colonoscopy N/A 11/10/2012    RCV:[id number] proctitis/Pancolonic diverticulosis/(TUBULAR ADENOMA)5 mm polyp in the transverse segment/4 mm polyps in the base of the cecum and diminutive polyp in the descending segment. INADEQUATE BOWEL PREP  . Colonoscopy N/A 11/28/2013    RMR: Radiation proctitis. Colonic diverticulosis. Colonic polyps-removed as described above.   [name] N/A 10/08/2012    Procedure: [name];  Surgeon: Elam Dutch, MD;  Location: [facility] CATH LAB;  Service: Cardiovascular;  Laterality: N/A;  . Av fistula placement Right 04/19/2015    Procedure: Excision of infected RUA AVG;  Surgeon: Conrad Mitchell, MD;  Location: [facility]  OR;  Service: Vascular;  Laterality: Right;    Social History   Social History  . Marital Status: Married    Spouse Name: N/A  . Number of Children: N/A  . Years of Education: N/A   Occupational History  . Not on file.   Social History Main Topics  . Smoking status: Current Every Day Smoker -- 0.15 packs/day for 50 years    Types: Cigarettes  . Smokeless tobacco: Never Used     Comment: 2-3 cigarettes daily  . Alcohol Use: No  . Drug Use: No  . Sexual Activity: Not on file   Other Topics Concern  . Not on file   Social History Narrative    Family History  Problem Relation Age of Onset  . Heart disease Mother   . Diabetes Mother   . Hypertension Mother   . Stroke Mother   . Heart disease Father   . Diabetes Father   . Heart disease Sister   . Diabetes Brother   . Colon cancer Neg Hx     Current Outpatient Prescriptions  Medication Sig Dispense Refill  . aspirin 81 MG tablet Take 81-162 mg by mouth 2 (two) times daily.     Marland Kitchen buPROPion (WELLBUTRIN XL) 150 MG 24 hr tablet Take 150 mg by mouth daily.    . calcium acetate (PHOSLO) 667 MG capsule Take 2,001 mg by mouth 2 (two) times daily.     Marland Kitchen  ciprofloxacin (CIPRO) 500 MG tablet     . cloNIDine (CATAPRES) 0.2 MG tablet Take 0.2 mg by mouth 3 (three) times daily.     Marland Kitchen docusate sodium (COLACE) 100 MG capsule Take 1 capsule (100 mg total) by mouth 2 (two) times daily. 10 capsule 0  . doxazosin (CARDURA) 8 MG tablet Take 8 mg by mouth at bedtime.     . furosemide (LASIX) 40 MG tablet Take 40 mg by mouth 2 (two) times daily.      Marland Kitchen gabapentin (NEURONTIN) 100 MG capsule Take 100 mg by mouth 2 (two) times daily.    . insulin detemir (LEVEMIR) 100 UNIT/ML injection Inject 20 Units into the skin at bedtime.     Marland Kitchen LEVEMIR FLEXTOUCH 100 UNIT/ML Pen     . Linaclotide (LINZESS) 145 MCG CAPS capsule Take 1 capsule (145 mcg total) by mouth daily. Take 30 minutes before breakfast (Patient taking differently: Take 145 mcg by mouth  daily. Take 30 minutes before breakfast/ Cannot take on the days that he goes to dialysis) 30 capsule 11  . omeprazole (PRILOSEC) 20 MG capsule     . QUEtiapine (SEROQUEL) 100 MG tablet Take 100 mg by mouth at bedtime.    . saxagliptin HCl (ONGLYZA) 5 MG TABS tablet Take 5 mg by mouth daily.    . simvastatin (ZOCOR) 20 MG tablet Take 20 mg by mouth at bedtime.      . Vitamin D, Ergocalciferol, (DRISDOL) 50000 UNITS CAPS Take 50,000 Units by mouth every 7 (seven) days. mondays    . calcium acetate (PHOSLO) 667 MG capsule      No current facility-administered medications for this visit.     No Known Allergies   REVIEW OF SYSTEMS:  (Positives checked otherwise negative)  CARDIOVASCULAR:   [ ]  chest pain,  [ ]  chest pressure,  [ ]  palpitations,  [ ]  shortness of breath when laying flat,  [ ]  shortness of breath with exertion,   [ ]  pain in feet when walking,  [ ]  pain in feet when laying flat, [ ]  history of blood clot in veins (DVT),  [ ]  history of phlebitis,  [ ]  swelling in legs,  [ ]  varicose veins  PULMONARY:   [ ]  productive cough,  [ ]  asthma,  [ ]  wheezing  NEUROLOGIC:   [ ]  weakness in arms or legs,  [ ]  numbness in arms or legs,  [ ]  difficulty speaking or slurred speech,  [ ]  temporary loss of vision in one eye,  [ ]  dizziness  HEMATOLOGIC:   [ ]  bleeding problems,  [ ]  problems with blood clotting too easily  MUSCULOSKEL:   [ ]  joint pain, [ ]  joint swelling  GASTROINTEST:   [ ]  vomiting blood,  [ ]  blood in stool     GENITOURINARY:   [ ]  burning with urination,  [ ]  blood in urine [x]  ESRD-HD: M-W-F  PSYCHIATRIC:   [ ]  history of major depression  INTEGUMENTARY:   [ ]  rashes,  [ ]  ulcers  CONSTITUTIONAL:   [ ]  fever,  [ ]  chills    Physical Examination  Filed Vitals:   06/15/15 1357 06/15/15 1359  BP: 207/90 202/95  Pulse: 103   Temp: 98.1 F (36.7 C)   TempSrc: Oral   Height: 5\' 8"  (1.727 m)   Weight: 159 lb (72.122 kg)     SpO2: 99%    Body mass index is 24.18 kg/(m^2).  General: A&O x 3,  WD, WN  Pulmonary: Sym exp, good air movt, CTAB, no rales, rhonchi, & wheezing  Cardiac: RRR, Nl S1, S2, no Murmurs, rubs or gallops  Vascular: Vessel Right Left  Radial Palpable Palpable  Ulnar Not Palpable Not Palpable  Brachial Palpable Palpable   Gastrointestinal: soft, NTND, no G/R, no HSM, no masses, no CVAT B  Musculoskeletal: M/S 5/5 throughout , Extremities without  ischemic changes , healed RUA incision, prior L BVT harvest incisions, on Sonosite: 4 mm medial brachial vein in L arm  Neurologic: Pain and light touch intact in extremities , Motor exam as listed above   Medical Decision Making  Gerald Davenport is a 74 y.o. male who presents with ESRD requiring hemodialysis.    Based on vein mapping and examination, this patient's permanent access options include: L 1st BRVT  I had an extensive discussion with this patient in regards to the nature of access surgery, including risk, benefits, and alternatives.    The patient is aware that the risks of access surgery include but are not limited to: bleeding, infection, steal syndrome, nerve damage, ischemic monomelic neuropathy, failure of access to mature, and possible need for additional access procedures in the future.  The patient is aware that I construct transposition procedures in two stages, requiring a separate operation to complete the procedure.  The patient has agreed to proceed with the above procedure which will be scheduled 20 SEP 16.   Adele Barthel, MD [facility] [place] Office: [phone] Pager: [phone]  06/15/2015, 2:36 PM

## 2015-06-18 ENCOUNTER — Encounter (HOSPITAL_COMMUNITY): Payer: Self-pay | Admitting: *Deleted

## 2015-06-18 MED ORDER — DEXTROSE 5 % IV SOLN
1.5000 g | INTRAVENOUS | Status: AC
  Administered 2015-06-19: 1.5 g via INTRAVENOUS
  Filled 2015-06-18: qty 1.5

## 2015-06-18 NOTE — Progress Notes (Signed)
Gerald Davenport reported that he takes MOM for constipation.  I instructed patient to talk to nephrologist  what he should take for take for constipation and to not to take MOM any longer.

## 2015-06-18 NOTE — Progress Notes (Signed)
   06/18/15 1500  OBSTRUCTIVE SLEEP APNEA  Have you ever been diagnosed with sleep apnea through a sleep study? No  Do you snore loudly (loud enough to be heard through closed doors)?  1  Do you often feel tired, fatigued, or sleepy during the daytime (such as falling asleep during driving or talking to someone)? 1  Do you have, or are you being treated for high blood pressure? 1  BMI more than 35 kg/m2? 0  Age > 50 (1-yes) 1  Neck circumference greater than:Male 16 inches or larger, Male 17inches or larger? 0 (68)  Male Gender (Yes=1) 1  Obstructive Sleep Apnea Score 5  Score 5 or greater  Results sent to PCP

## 2015-06-19 ENCOUNTER — Encounter (HOSPITAL_COMMUNITY): Payer: Self-pay | Admitting: General Practice

## 2015-06-19 ENCOUNTER — Encounter (HOSPITAL_COMMUNITY)
Admission: RE | Disposition: A | Discharge status: Home / Self Care | Payer: Self-pay | Source: Ambulatory Visit | Attending: Vascular Surgery

## 2015-06-19 ENCOUNTER — Ambulatory Visit (HOSPITAL_COMMUNITY): Payer: Medicare PPO | Admitting: Anesthesiology

## 2015-06-19 ENCOUNTER — Ambulatory Visit (HOSPITAL_COMMUNITY)
Admission: RE | Admit: 2015-06-19 | Discharge: 2015-06-19 | Disposition: A | Discharge status: Home / Self Care | Payer: Medicare PPO | Source: Ambulatory Visit | Attending: Vascular Surgery | Admitting: Vascular Surgery

## 2015-06-19 DIAGNOSIS — Z923 Personal history of irradiation: Secondary | ICD-10-CM | POA: Diagnosis not present

## 2015-06-19 DIAGNOSIS — Z792 Long term (current) use of antibiotics: Secondary | ICD-10-CM | POA: Insufficient documentation

## 2015-06-19 DIAGNOSIS — E785 Hyperlipidemia, unspecified: Secondary | ICD-10-CM | POA: Diagnosis not present

## 2015-06-19 DIAGNOSIS — E213 Hyperparathyroidism, unspecified: Secondary | ICD-10-CM | POA: Diagnosis not present

## 2015-06-19 DIAGNOSIS — Z79899 Other long term (current) drug therapy: Secondary | ICD-10-CM | POA: Diagnosis not present

## 2015-06-19 DIAGNOSIS — I120 Hypertensive chronic kidney disease with stage 5 chronic kidney disease or end stage renal disease: Secondary | ICD-10-CM | POA: Insufficient documentation

## 2015-06-19 DIAGNOSIS — Z8546 Personal history of malignant neoplasm of prostate: Secondary | ICD-10-CM | POA: Insufficient documentation

## 2015-06-19 DIAGNOSIS — F419 Anxiety disorder, unspecified: Secondary | ICD-10-CM | POA: Diagnosis not present

## 2015-06-19 DIAGNOSIS — E1122 Type 2 diabetes mellitus with diabetic chronic kidney disease: Secondary | ICD-10-CM | POA: Diagnosis not present

## 2015-06-19 DIAGNOSIS — N186 End stage renal disease: Secondary | ICD-10-CM | POA: Diagnosis not present

## 2015-06-19 DIAGNOSIS — F17210 Nicotine dependence, cigarettes, uncomplicated: Secondary | ICD-10-CM | POA: Insufficient documentation

## 2015-06-19 DIAGNOSIS — Z794 Long term (current) use of insulin: Secondary | ICD-10-CM | POA: Insufficient documentation

## 2015-06-19 DIAGNOSIS — Z7982 Long term (current) use of aspirin: Secondary | ICD-10-CM | POA: Diagnosis not present

## 2015-06-19 DIAGNOSIS — Z992 Dependence on renal dialysis: Secondary | ICD-10-CM | POA: Diagnosis not present

## 2015-06-19 DIAGNOSIS — N185 Chronic kidney disease, stage 5: Secondary | ICD-10-CM | POA: Diagnosis not present

## 2015-06-19 HISTORY — PX: BASCILIC VEIN TRANSPOSITION: SHX5742

## 2015-06-19 HISTORY — DX: Constipation, unspecified: K59.00

## 2015-06-19 HISTORY — DX: Gastro-esophageal reflux disease without esophagitis: K21.9

## 2015-06-19 HISTORY — DX: Reserved for inherently not codable concepts without codable children: IMO0001

## 2015-06-19 LAB — GLUCOSE, CAPILLARY
GLUCOSE-CAPILLARY: 103 mg/dL — AB (ref 65–99)
Glucose-Capillary: 61 mg/dL — ABNORMAL LOW (ref 65–99)
Glucose-Capillary: 83 mg/dL (ref 65–99)

## 2015-06-19 LAB — POCT I-STAT 4, (NA,K, GLUC, HGB,HCT)
Glucose, Bld: 116 mg/dL — ABNORMAL HIGH (ref 65–99)
HEMATOCRIT: 36 % — AB (ref 39.0–52.0)
Hemoglobin: 12.2 g/dL — ABNORMAL LOW (ref 13.0–17.0)
Potassium: 5.1 mmol/L (ref 3.5–5.1)
Sodium: 138 mmol/L (ref 135–145)

## 2015-06-19 SURGERY — TRANSPOSITION, VEIN, BASILIC
Anesthesia: Monitor Anesthesia Care | Site: Arm Upper | Laterality: Left

## 2015-06-19 MED ORDER — SODIUM CHLORIDE 0.9 % IV SOLN
INTRAVENOUS | Status: DC
  Administered 2015-06-19: 07:00:00 via INTRAVENOUS

## 2015-06-19 MED ORDER — THROMBIN 20000 UNITS EX SOLR
CUTANEOUS | Status: AC
  Filled 2015-06-19: qty 20000

## 2015-06-19 MED ORDER — 0.9 % SODIUM CHLORIDE (POUR BTL) OPTIME
TOPICAL | Status: DC | PRN
  Administered 2015-06-19: 1000 mL

## 2015-06-19 MED ORDER — MIDAZOLAM HCL 2 MG/2ML IJ SOLN
INTRAMUSCULAR | Status: AC
  Filled 2015-06-19: qty 4

## 2015-06-19 MED ORDER — EPHEDRINE SULFATE 50 MG/ML IJ SOLN
INTRAMUSCULAR | Status: AC
  Filled 2015-06-19: qty 1

## 2015-06-19 MED ORDER — PROPOFOL INFUSION 10 MG/ML OPTIME
INTRAVENOUS | Status: DC | PRN
  Administered 2015-06-19: 50 ug/kg/min via INTRAVENOUS

## 2015-06-19 MED ORDER — SUCCINYLCHOLINE CHLORIDE 20 MG/ML IJ SOLN
INTRAMUSCULAR | Status: AC
  Filled 2015-06-19: qty 1

## 2015-06-19 MED ORDER — ONDANSETRON HCL 4 MG/2ML IJ SOLN
INTRAMUSCULAR | Status: AC
  Filled 2015-06-19: qty 2

## 2015-06-19 MED ORDER — LIDOCAINE HCL (PF) 1 % IJ SOLN
INTRAMUSCULAR | Status: AC
  Filled 2015-06-19: qty 30

## 2015-06-19 MED ORDER — DEXTROSE 50 % IV SOLN
INTRAVENOUS | Status: AC
  Filled 2015-06-19: qty 50

## 2015-06-19 MED ORDER — PROMETHAZINE HCL 25 MG/ML IJ SOLN: 6.2500 mg | INTRAMUSCULAR | Status: DC | PRN

## 2015-06-19 MED ORDER — SODIUM CHLORIDE 0.9 % IV SOLN
INTRAVENOUS | Status: DC | PRN
  Administered 2015-06-19: 08:00:00

## 2015-06-19 MED ORDER — FENTANYL CITRATE (PF) 100 MCG/2ML IJ SOLN: 25.0000 ug | INTRAMUSCULAR | Status: DC | PRN

## 2015-06-19 MED ORDER — LIDOCAINE HCL (PF) 1 % IJ SOLN
INTRAMUSCULAR | Status: DC | PRN
  Administered 2015-06-19: 8 mL

## 2015-06-19 MED ORDER — PROPOFOL 10 MG/ML IV BOLUS
INTRAVENOUS | Status: AC
  Filled 2015-06-19: qty 20

## 2015-06-19 MED ORDER — LIDOCAINE HCL (CARDIAC) 20 MG/ML IV SOLN
INTRAVENOUS | Status: AC
  Filled 2015-06-19: qty 5

## 2015-06-19 MED ORDER — OXYCODONE HCL 5 MG PO TABS: 5.0000 mg | ORAL_TABLET | Freq: Four times a day (QID) | ORAL | Status: DC | PRN

## 2015-06-19 MED ORDER — DEXTROSE 50 % IV SOLN
1.0000 | Freq: Once | INTRAVENOUS | Status: AC
  Administered 2015-06-19: 50 mL via INTRAVENOUS

## 2015-06-19 MED ORDER — ROCURONIUM BROMIDE 50 MG/5ML IV SOLN
INTRAVENOUS | Status: AC
  Filled 2015-06-19: qty 1

## 2015-06-19 MED ORDER — FENTANYL CITRATE (PF) 250 MCG/5ML IJ SOLN
INTRAMUSCULAR | Status: AC
  Filled 2015-06-19: qty 5

## 2015-06-19 MED ORDER — SODIUM CHLORIDE 0.9 % IJ SOLN
INTRAMUSCULAR | Status: AC
  Filled 2015-06-19: qty 10

## 2015-06-19 SURGICAL SUPPLY — 44 items
CANISTER SUCTION 2500CC (MISCELLANEOUS) ×3 IMPLANT
CLIP TI MEDIUM 24 (CLIP) ×3 IMPLANT
CLIP TI WIDE RED SMALL 24 (CLIP) ×3 IMPLANT
CORDS BIPOLAR (ELECTRODE) IMPLANT
COVER PROBE W GEL 5X96 (DRAPES) ×2 IMPLANT
DECANTER SPIKE VIAL GLASS SM (MISCELLANEOUS) ×1 IMPLANT
DRSG COVADERM 4X10 (GAUZE/BANDAGES/DRESSINGS) IMPLANT
DRSG COVADERM 4X8 (GAUZE/BANDAGES/DRESSINGS) IMPLANT
ELECT REM PT RETURN 9FT ADLT (ELECTROSURGICAL) ×3
ELECTRODE REM PT RTRN 9FT ADLT (ELECTROSURGICAL) ×1 IMPLANT
GLOVE BIO SURGEON STRL SZ 6.5 (GLOVE) ×1 IMPLANT
GLOVE BIO SURGEON STRL SZ7 (GLOVE) ×7 IMPLANT
GLOVE BIO SURGEONS STRL SZ 6.5 (GLOVE) ×1
GLOVE BIOGEL PI IND STRL 6.5 (GLOVE) IMPLANT
GLOVE BIOGEL PI IND STRL 7.0 (GLOVE) IMPLANT
GLOVE BIOGEL PI IND STRL 7.5 (GLOVE) ×1 IMPLANT
GLOVE BIOGEL PI INDICATOR 6.5 (GLOVE) ×2
GLOVE BIOGEL PI INDICATOR 7.0 (GLOVE) ×4
GLOVE BIOGEL PI INDICATOR 7.5 (GLOVE) ×2
GLOVE SS BIOGEL STRL SZ 7 (GLOVE) IMPLANT
GLOVE SUPERSENSE BIOGEL SZ 7 (GLOVE) ×2
GOWN STRL REUS W/ TWL LRG LVL3 (GOWN DISPOSABLE) ×3 IMPLANT
GOWN STRL REUS W/ TWL XL LVL3 (GOWN DISPOSABLE) IMPLANT
GOWN STRL REUS W/TWL LRG LVL3 (GOWN DISPOSABLE) ×9
GOWN STRL REUS W/TWL XL LVL3 (GOWN DISPOSABLE) ×6
KIT BASIN OR (CUSTOM PROCEDURE TRAY) ×3 IMPLANT
KIT ROOM TURNOVER OR (KITS) ×3 IMPLANT
LIQUID BAND (GAUZE/BANDAGES/DRESSINGS) ×3 IMPLANT
NS IRRIG 1000ML POUR BTL (IV SOLUTION) ×3 IMPLANT
PACK CV ACCESS (CUSTOM PROCEDURE TRAY) ×3 IMPLANT
PAD ARMBOARD 7.5X6 YLW CONV (MISCELLANEOUS) ×6 IMPLANT
SPONGE SURGIFOAM ABS GEL 100 (HEMOSTASIS) IMPLANT
STAPLER VISISTAT 35W (STAPLE) IMPLANT
SUT MNCRL AB 4-0 PS2 18 (SUTURE) ×3 IMPLANT
SUT PROLENE 6 0 BV (SUTURE) ×5 IMPLANT
SUT PROLENE 6 0 CC (SUTURE) ×2 IMPLANT
SUT PROLENE 7 0 BV 1 (SUTURE) IMPLANT
SUT SILK 2 0 SH (SUTURE) ×1 IMPLANT
SUT VIC AB 2-0 CT1 27 (SUTURE)
SUT VIC AB 2-0 CT1 TAPERPNT 27 (SUTURE) ×1 IMPLANT
SUT VIC AB 3-0 SH 27 (SUTURE) ×3
SUT VIC AB 3-0 SH 27X BRD (SUTURE) ×2 IMPLANT
UNDERPAD 30X30 INCONTINENT (UNDERPADS AND DIAPERS) ×3 IMPLANT
WATER STERILE IRR 1000ML POUR (IV SOLUTION) ×3 IMPLANT

## 2015-06-19 NOTE — Anesthesia Preprocedure Evaluation (Addendum)
Anesthesia Evaluation  Patient identified by MRN, date of birth, ID band Patient awake    Reviewed: Allergy & Precautions, NPO status , Patient's Chart, lab work & pertinent test results  Airway Mallampati: II  TM Distance: >3 FB Neck ROM: Full    Dental no notable dental hx.    Pulmonary Current Smoker,    Pulmonary exam normal breath sounds clear to auscultation       Cardiovascular hypertension, Pt. on medications Normal cardiovascular exam Rhythm:Regular Rate:Normal  Exercise Capacity: Lexiscan with no exercise. BP Response: Hypertensive at baseline with normal response Clinical Symptoms: There is dyspnea. ECG Impression: No significant ECG changes with Lexiscan. Comparison with Prior Nuclear Study: No significant change from previous study  Overall Impression: Normal stress nuclear study.  LV Wall Motion: NL LV Function; NL Wall Motion;EF 54%   Neuro/Psych negative neurological ROS  negative psych ROS   GI/Hepatic Neg liver ROS, GERD  Medicated,  Endo/Other  diabetes, Insulin Dependent  Renal/GU ESRF and DialysisRenal disease  negative genitourinary   Musculoskeletal negative musculoskeletal ROS (+)   Abdominal   Peds negative pediatric ROS (+)  Hematology negative hematology ROS (+)   Anesthesia Other Findings   Reproductive/Obstetrics negative OB ROS                          Anesthesia Physical Anesthesia Plan  ASA: III  Anesthesia Plan: MAC   Post-op Pain Management:    Induction: Intravenous  Airway Management Planned: Simple Face Mask  Additional Equipment:   Intra-op Plan:   Post-operative Plan: Extubation in OR  Informed Consent: I have reviewed the patients History and Physical, chart, labs and discussed the procedure including the risks, benefits and alternatives for the proposed anesthesia with the patient or authorized representative who has indicated  his/her understanding and acceptance.   Dental advisory given  Plan Discussed with: CRNA and Surgeon  Anesthesia Plan Comments:       Anesthesia Quick Evaluation

## 2015-06-19 NOTE — H&P (View-Only) (Signed)
Established Dialysis Access  History of Present Illness  Gerald Davenport is a 74 y.o. (Jun 24, 1941) male who presents for re-evaluation for permanent access.  The patient is right hand dominant.  Previous access procedures have been completed in the right arm.  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  The patient has never had a previous PPM placed.  The patient denies any prior procedures.  His prior RUA incision has healed at this point.  Past Medical History  Diagnosis Date  . Hypertension   . Hyperlipidemia   . Chronic kidney disease     dialysis Tues, Thur, Sat  . Diabetes mellitus   . Cancer     Prostate cancer treated with radiation therapy  . Thyroid disease     hyperparathyroidism  . Anemia   . Positive PPD   . Clot 05/2006    Arterial clot right leg   . Hemodialysis patient 6/13  . Diverticulosis   . Anxiety     Past Surgical History  Procedure Laterality Date  . 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 Av fistula placement  11/04/2010    left and left  . Av fistula placement  11/20/2011    Procedure: INSERTION OF ARTERIOVENOUS (AV) GORE-TEX GRAFT ARM;  Surgeon: Angelia Mould, MD;  Location: [facility] OR;  Service: Vascular;  Laterality: Right;  . Colonoscopy N/A 11/10/2012    ELF:[id number] proctitis/Pancolonic diverticulosis/(TUBULAR ADENOMA)5 mm polyp in the transverse segment/4 mm polyps in the base of the cecum and diminutive polyp in the descending segment. INADEQUATE BOWEL PREP  . Colonoscopy N/A 11/28/2013    RMR: Radiation proctitis. Colonic diverticulosis. Colonic polyps-removed as described above.   [name] N/A 10/08/2012    Procedure: [name];  Surgeon: Elam Dutch, MD;  Location: [facility] CATH LAB;  Service: Cardiovascular;  Laterality: N/A;  . Av fistula placement Right 04/19/2015    Procedure: Excision of infected RUA AVG;  Surgeon: Conrad Whitehall, MD;  Location: [facility]  OR;  Service: Vascular;  Laterality: Right;    Social History   Social History  . Marital Status: Married    Spouse Name: N/A  . Number of Children: N/A  . Years of Education: N/A   Occupational History  . Not on file.   Social History Main Topics  . Smoking status: Current Every Day Smoker -- 0.15 packs/day for 50 years    Types: Cigarettes  . Smokeless tobacco: Never Used     Comment: 2-3 cigarettes daily  . Alcohol Use: No  . Drug Use: No  . Sexual Activity: Not on file   Other Topics Concern  . Not on file   Social History Narrative    Family History  Problem Relation Age of Onset  . Heart disease Mother   . Diabetes Mother   . Hypertension Mother   . Stroke Mother   . Heart disease Father   . Diabetes Father   . Heart disease Sister   . Diabetes Brother   . Colon cancer Neg Hx     Current Outpatient Prescriptions  Medication Sig Dispense Refill  . aspirin 81 MG tablet Take 81-162 mg by mouth 2 (two) times daily.     Marland Kitchen buPROPion (WELLBUTRIN XL) 150 MG 24 hr tablet Take 150 mg by mouth daily.    . calcium acetate (PHOSLO) 667 MG capsule Take 2,001 mg by mouth 2 (two) times daily.     Marland Kitchen  ciprofloxacin (CIPRO) 500 MG tablet     . cloNIDine (CATAPRES) 0.2 MG tablet Take 0.2 mg by mouth 3 (three) times daily.     Marland Kitchen docusate sodium (COLACE) 100 MG capsule Take 1 capsule (100 mg total) by mouth 2 (two) times daily. 10 capsule 0  . doxazosin (CARDURA) 8 MG tablet Take 8 mg by mouth at bedtime.     . furosemide (LASIX) 40 MG tablet Take 40 mg by mouth 2 (two) times daily.      Marland Kitchen gabapentin (NEURONTIN) 100 MG capsule Take 100 mg by mouth 2 (two) times daily.    . insulin detemir (LEVEMIR) 100 UNIT/ML injection Inject 20 Units into the skin at bedtime.     Marland Kitchen LEVEMIR FLEXTOUCH 100 UNIT/ML Pen     . Linaclotide (LINZESS) 145 MCG CAPS capsule Take 1 capsule (145 mcg total) by mouth daily. Take 30 minutes before breakfast (Patient taking differently: Take 145 mcg by mouth  daily. Take 30 minutes before breakfast/ Cannot take on the days that he goes to dialysis) 30 capsule 11  . omeprazole (PRILOSEC) 20 MG capsule     . QUEtiapine (SEROQUEL) 100 MG tablet Take 100 mg by mouth at bedtime.    . saxagliptin HCl (ONGLYZA) 5 MG TABS tablet Take 5 mg by mouth daily.    . simvastatin (ZOCOR) 20 MG tablet Take 20 mg by mouth at bedtime.      . Vitamin D, Ergocalciferol, (DRISDOL) 50000 UNITS CAPS Take 50,000 Units by mouth every 7 (seven) days. mondays    . calcium acetate (PHOSLO) 667 MG capsule      No current facility-administered medications for this visit.     No Known Allergies   REVIEW OF SYSTEMS:  (Positives checked otherwise negative)  CARDIOVASCULAR:   [ ]  chest pain,  [ ]  chest pressure,  [ ]  palpitations,  [ ]  shortness of breath when laying flat,  [ ]  shortness of breath with exertion,   [ ]  pain in feet when walking,  [ ]  pain in feet when laying flat, [ ]  history of blood clot in veins (DVT),  [ ]  history of phlebitis,  [ ]  swelling in legs,  [ ]  varicose veins  PULMONARY:   [ ]  productive cough,  [ ]  asthma,  [ ]  wheezing  NEUROLOGIC:   [ ]  weakness in arms or legs,  [ ]  numbness in arms or legs,  [ ]  difficulty speaking or slurred speech,  [ ]  temporary loss of vision in one eye,  [ ]  dizziness  HEMATOLOGIC:   [ ]  bleeding problems,  [ ]  problems with blood clotting too easily  MUSCULOSKEL:   [ ]  joint pain, [ ]  joint swelling  GASTROINTEST:   [ ]  vomiting blood,  [ ]  blood in stool     GENITOURINARY:   [ ]  burning with urination,  [ ]  blood in urine [x]  ESRD-HD: M-W-F  PSYCHIATRIC:   [ ]  history of major depression  INTEGUMENTARY:   [ ]  rashes,  [ ]  ulcers  CONSTITUTIONAL:   [ ]  fever,  [ ]  chills    Physical Examination  Filed Vitals:   06/15/15 1357 06/15/15 1359  BP: 207/90 202/95  Pulse: 103   Temp: 98.1 F (36.7 C)   TempSrc: Oral   Height: 5\' 8"  (1.727 m)   Weight: 159 lb (72.122 kg)     SpO2: 99%    Body mass index is 24.18 kg/(m^2).  General: A&O x 3,  WD, WN  Pulmonary: Sym exp, good air movt, CTAB, no rales, rhonchi, & wheezing  Cardiac: RRR, Nl S1, S2, no Murmurs, rubs or gallops  Vascular: Vessel Right Left  Radial Palpable Palpable  Ulnar Not Palpable Not Palpable  Brachial Palpable Palpable   Gastrointestinal: soft, NTND, no G/R, no HSM, no masses, no CVAT B  Musculoskeletal: M/S 5/5 throughout , Extremities without  ischemic changes , healed RUA incision, prior L BVT harvest incisions, on Sonosite: 4 mm medial brachial vein in L arm  Neurologic: Pain and light touch intact in extremities , Motor exam as listed above   Medical Decision Making  Gerald Davenport is a 74 y.o. male who presents with ESRD requiring hemodialysis.    Based on vein mapping and examination, this patient's permanent access options include: L 1st BRVT  I had an extensive discussion with this patient in regards to the nature of access surgery, including risk, benefits, and alternatives.    The patient is aware that the risks of access surgery include but are not limited to: bleeding, infection, steal syndrome, nerve damage, ischemic monomelic neuropathy, failure of access to mature, and possible need for additional access procedures in the future.  The patient is aware that I construct transposition procedures in two stages, requiring a separate operation to complete the procedure.  The patient has agreed to proceed with the above procedure which will be scheduled 20 SEP 16.   Adele Barthel, MD [facility] [place] Office: [phone] Pager: [phone]  06/15/2015, 2:36 PM

## 2015-06-19 NOTE — Progress Notes (Signed)
Report given to ronda hunt rn as caregiver 

## 2015-06-19 NOTE — Op Note (Signed)
OPERATIVE NOTE   PROCEDURE: 1. left first stage brachial vein transposition (brachiobrachial arteriovenous fistula) placement  PRE-OPERATIVE DIAGNOSIS: end stage renal disease   POST-OPERATIVE DIAGNOSIS: same as above   SURGEON: Adele Barthel, MD  ASSISTANT(S): Leontine Locket, PAC   ANESTHESIA: local and MAC  ESTIMATED BLOOD LOSS: 50 cc  FINDING(S): 1. Thickened brachial artery 2. Palpable thrill with dopplerable radial signal at end ofcase 3. Median nerve lies superficial to anastomosis  SPECIMEN(S):  none  INDICATIONS:   Gerald Davenport is a 74 y.o. male who presents with end stage renal disease .  The patient is scheduled for left first stage brachial vein transposition.  The patient is aware the risks include but are not limited to: bleeding, infection, steal syndrome, nerve damage, ischemic monomelic neuropathy, failure to mature, and need for additional procedures.  The patient is aware of the risks of the procedure and elects to proceed forward.  DESCRIPTION: After full informed written consent was obtained from the patient, the patient was brought back to the operating room and placed supine upon the operating table.  Prior to induction, the patient received IV antibiotics.   After obtaining adequate anesthesia, the patient was then prepped and draped in the standard fashion for a left arm access procedure.  I turned my attention first to identifying the patient's brachial vein and brachial artery.  Using SonoSite guidance, the location of these vessels were marked out on the skin.   At this point, I injected local anesthetic to obtain a field block of the antecubitum.  In total, I injected about 10 mL of a 1% lidocaine without  epinephrine.  I made a longitudinal incision at the level of the antecubitum and dissected through the subcutaneous tissue and fascia to gain exposure of the brachial artery.  This was noted to be 4 mm in diameter externally.  This was dissected out  proximally and distally and controlled with vessel loops .  I then dissected out the brachial vein.  This was noted to be 3 mm in diameter externally.  The distal segment of the vein was ligated with a  2-0 silk, and the vein was transected.  The proximal segment was iinterrogated with serial dilators.  The vein accepted up to a 4 mm dilator without any difficulty.  I then instilled the heparinized saline into the vein and clamped it.  At this point, I reset my exposure of the brachial artery and placed the artery under tension proximally and distally.  I made an arteriotomy with a #11 blade, and then I extended the arteriotomy with a Potts scissor.  I injected heparinized saline proximal and distal to this arteriotomy.  The vein was then sewn to the artery in an end-to-side configuration with a running stitch of 7-0 Prolene.  Prior to completing this anastomosis, I allowed the vein and artery to backbleed.  There was no evidence of clot from any vessels.  I completed the anastomosis in the usual fashion and then released all vessel loops and clamps.  There was a palpable thrill in the venous outflow, and there was a dopplerable radial pulse.  At this point, I irrigated out the surgical wound.  There was no further active bleeding.  The subcutaneous tissue was reapproximated with a running stitch of 3-0 Vicryl.  The skin was then reapproximated with a running subcuticular stitch of 4-0 Vicryl.  The skin was then cleaned, dried, and reinforced with Dermabond.  The patient tolerated this procedure well.  COMPLICATIONS: none  CONDITION: stable   Adele Barthel, MD [facility] [place] Office: [phone] Pager: [phone]  06/19/2015, 8:39 AM

## 2015-06-19 NOTE — Progress Notes (Signed)
pts bs=61 dr rose notifed order obtained and carried out

## 2015-06-19 NOTE — Anesthesia Postprocedure Evaluation (Signed)
  Anesthesia Post-op Note  Patient: Gerald Davenport  Procedure(s) Performed: Procedure(s) (LRB): FIRST STAGE BRACHIAL VEIN TRANSPOSITION (Left)  Patient Location: PACU  Anesthesia Type: MAC  Level of Consciousness: awake and alert   Airway and Oxygen Therapy: Patient Spontanous Breathing  Post-op Pain: mild  Post-op Assessment: Post-op Vital signs reviewed, Patient's Cardiovascular Status Stable, Respiratory Function Stable, Patent Airway and No signs of Nausea or vomiting  Last Vitals:  Filed Vitals:   06/19/15 0915  BP: 156/61  Pulse: 87  Temp:   Resp: 12    Post-op Vital Signs: stable   Complications: No apparent anesthesia complications

## 2015-06-19 NOTE — Discharge Instructions (Signed)
° ° °  06/19/2015 DORSEY AUTHEMENT [id number] 1941/02/13  Surgeon(s): Conrad [place], MD  Procedure(s): FIRST STAGE BRACHIAL VEIN TRANSPOSITION  x Do not stick fistula for 12 weeks

## 2015-06-19 NOTE — Transfer of Care (Signed)
Immediate Anesthesia Transfer of Care Note  Patient: Gerald Davenport  Procedure(s) Performed: Procedure(s): FIRST STAGE BRACHIAL VEIN TRANSPOSITION (Left)  Patient Location: PACU  Anesthesia Type:MAC  Level of Consciousness: sedated  Airway & Oxygen Therapy: Patient Spontanous Breathing and Patient connected to face mask oxygen  Post-op Assessment: Report given to RN and Post -op Vital signs reviewed and stable  Post vital signs: stable  Last Vitals:  Filed Vitals:   06/19/15 0648  BP: 143/58  Pulse: 78  Temp: 36.1 C  Resp: 16    Complications: No apparent anesthesia complications

## 2015-06-19 NOTE — Interval H&P Note (Signed)
History and Physical Interval Note:  06/19/2015 7:22 AM  Gerald Davenport  has presented today for surgery, with the diagnosis of Chronic kidney disease with hemodialysis  The various methods of treatment have been discussed with the patient and family. After consideration of risks, benefits and other options for treatment, the patient has consented to  Procedure(s): Taylor (Left) as a surgical intervention .  The patient's history has been reviewed, patient examined, no change in status, stable for surgery.  I have reviewed the patient's chart and labs.  Questions were answered to the patient's satisfaction.     Adele Barthel

## 2015-06-20 ENCOUNTER — Encounter (HOSPITAL_COMMUNITY): Payer: Self-pay | Admitting: Vascular Surgery

## 2015-06-20 ENCOUNTER — Telehealth: Payer: Self-pay | Admitting: Vascular Surgery

## 2015-06-20 NOTE — Telephone Encounter (Signed)
-----   Message from Mena Goes, RN sent at 06/19/2015  9:31 AM EDT ----- Regarding: Schedule   ----- Message -----    From: Conrad Gueydan, MD    Sent: 06/19/2015   8:41 AM      To: [address]  Gerald Davenport [id number] 01-12-1941  PROCEDURE: left first stage brachial vein transposition (brachiobrachial arteriovenous fistula) placement  Asst: Leontine Locket, PAC   Follow-up: 6 weeks

## 2015-06-20 NOTE — Telephone Encounter (Signed)
LM for pt re appt, dpm °

## 2015-08-07 ENCOUNTER — Encounter: Payer: Self-pay | Admitting: Vascular Surgery

## 2015-08-10 ENCOUNTER — Encounter: Payer: Medicare PPO | Admitting: Vascular Surgery

## 2015-08-27 ENCOUNTER — Encounter: Payer: Self-pay | Admitting: Vascular Surgery

## 2015-08-29 ENCOUNTER — Ambulatory Visit (INDEPENDENT_AMBULATORY_CARE_PROVIDER_SITE_OTHER): Payer: Medicare PPO | Admitting: Vascular Surgery

## 2015-08-29 ENCOUNTER — Encounter: Payer: Self-pay | Admitting: Vascular Surgery

## 2015-08-29 VITALS — BP 123/61 | HR 82 | Temp 97.0°F | Resp 16 | Ht 68.0 in | Wt 166.8 lb

## 2015-08-29 DIAGNOSIS — Z992 Dependence on renal dialysis: Secondary | ICD-10-CM

## 2015-08-29 DIAGNOSIS — N186 End stage renal disease: Secondary | ICD-10-CM

## 2015-08-29 NOTE — Progress Notes (Signed)
    Postoperative Access Visit   History of Present Illness  Gerald Davenport is a 74 y.o. year old male who presents for postoperative follow-up for: L 1st BRVT (Date: 06/19/15).  The patient's wounds are healed.  The patient notes no steal symptoms.  The patient is able to complete their activities of daily living.  The patient's current symptoms are: none.  For VQI Use Only  PRE-ADM LIVING: Home  AMB STATUS: Ambulatory  Physical Examination Filed Vitals:   08/29/15 1244  BP: 123/61  Pulse: 82  Temp: 97 F (36.1 C)  Resp: 16    LUE: Incision is healed, skin feels warm, hand grip is 5/5, sensation in digits is intact, palpable thrill, bruit can be auscultated, On Sonosite: fistula only 4 mm   Medical Decision Making  Gerald Davenport is a 74 y.o. year old male who presents s/p L 1st stage BRVT.   Unfortunately, this patient's arterial disease might limit this fistula's maturation.  Wait another month before moving on.  I suspect that placement of an AVG in this L arm will also fail, due to arterial disease.    Another option might be an upper arm loop AVG.  Will rediscuss the issue in another month.  Thank you for allowing [place] to participate in this patient's care.  Adele Barthel, MD [facility] [place] Office: [phone] Pager: [phone]  08/29/2015, 12:47 PM

## 2015-09-19 ENCOUNTER — Encounter: Payer: Self-pay | Admitting: Vascular Surgery

## 2015-09-28 ENCOUNTER — Other Ambulatory Visit: Payer: Self-pay

## 2015-09-28 ENCOUNTER — Encounter: Payer: Self-pay | Admitting: Vascular Surgery

## 2015-09-28 ENCOUNTER — Ambulatory Visit (INDEPENDENT_AMBULATORY_CARE_PROVIDER_SITE_OTHER): Payer: Self-pay | Admitting: Vascular Surgery

## 2015-09-28 VITALS — BP 116/49 | HR 85 | Temp 97.4°F | Resp 16 | Ht 69.0 in | Wt 164.0 lb

## 2015-09-28 DIAGNOSIS — N186 End stage renal disease: Secondary | ICD-10-CM

## 2015-09-28 DIAGNOSIS — Z992 Dependence on renal dialysis: Secondary | ICD-10-CM

## 2015-09-28 NOTE — Progress Notes (Signed)
    Postoperative Access Visit   History of Present Illness  Gerald Davenport is a 74 y.o. year old male who presents for postoperative follow-up for: L 1st BRVT (Date: 05/2015).  The patient's wounds are healed.  The patient notes no steal symptoms.  The patient is able to complete their activities of daily living.  The patient's current symptoms are: none.  Reported HD tried to cannulate the fistula last week.  For VQI Use Only  PRE-ADM LIVING: Home  AMB STATUS: Ambulatory  Physical Examination Filed Vitals:   09/28/15 1158  BP: 116/49  Pulse: 85  Temp: 97.4 F (36.3 C)  Resp: 16   Pulmonary: Sym exp, good air movt, CTAB, no rales, rhonchi, & wheezing  Cardiac: RRR, Nl S1, S2, no Murmurs, rubs or gallops  LUE: Incision is healed, skin feels warm hand grip is 5/5, sensation in digits is intact, palpable thrill, bruit can be auscultated: wheezy, on Sonosite: fistula 3-4 mm  Medical Decision Making  MARVELL TOLLEFSON is a 74 y.o. year old male who presents s/p failed L first stage BRVT, ESRD on HD.   Fistula is in the process of failing so would proceed with Ligation of L 1st stage BRVT, LUA AVG placement  This is scheduled for 5 JAN 17 Risk, benefits, and alternatives to access surgery were discussed.  The patient is aware the risks include but are not limited to: bleeding, infection, steal syndrome, nerve damage, ischemic monomelic neuropathy, failure to mature, need for additional procedures, death and stroke.   The patient agrees to proceed forward with the procedure.  Thank you for allowing [place] to participate in this patient's care.  Adele Barthel, MD [facility] [place] Office: [phone] Pager: [phone]  09/28/2015, 12:15 PM

## 2015-10-03 ENCOUNTER — Encounter (HOSPITAL_COMMUNITY): Payer: Self-pay | Admitting: *Deleted

## 2015-10-03 MED ORDER — CHLORHEXIDINE GLUCONATE CLOTH 2 % EX PADS
6.0000 | MEDICATED_PAD | Freq: Once | CUTANEOUS | Status: DC
Start: 2015-10-04 — End: 2015-10-04

## 2015-10-03 MED ORDER — DEXTROSE 5 % IV SOLN
1.5000 g | INTRAVENOUS | Status: AC
  Administered 2015-10-04: 1.5 g via INTRAVENOUS
  Filled 2015-10-03: qty 1.5

## 2015-10-03 MED ORDER — SODIUM CHLORIDE 0.9 % IV SOLN
INTRAVENOUS | Status: DC
  Administered 2015-10-04: 10:00:00 via INTRAVENOUS

## 2015-10-03 NOTE — Progress Notes (Signed)
Pt denies chest pain but stated that he felt a little short of breath while dialyzing. Pt denies being under the care of a cardiologist and denies having a cardiac cath. Pt advised that pre-op instructions be given to Mickel Baas, RN at the dialysis center. Mickel Baas was given all instructions including DM protocol for BS < 70 and >220, pt is to take 20 units of Levemir tonight instead of the usual 25 and pt is to take no diabetic pills in the A.M., only the pills instructed to take. Mickel Baas made aware to have pt stop taking otc vitamins, herbal medications and fish oil. Mickel Baas verbalized understanding of all pre-op instructions.

## 2015-10-04 ENCOUNTER — Ambulatory Visit (HOSPITAL_COMMUNITY): Payer: Medicare Other | Admitting: Certified Registered Nurse Anesthetist

## 2015-10-04 ENCOUNTER — Ambulatory Visit (HOSPITAL_COMMUNITY)
Admission: RE | Admit: 2015-10-04 | Discharge: 2015-10-04 | Disposition: A | Discharge status: Home / Self Care | Payer: Medicare Other | Source: Ambulatory Visit | Attending: Vascular Surgery | Admitting: Vascular Surgery

## 2015-10-04 ENCOUNTER — Encounter (HOSPITAL_COMMUNITY)
Admission: RE | Disposition: A | Discharge status: Home / Self Care | Payer: Self-pay | Source: Ambulatory Visit | Attending: Vascular Surgery

## 2015-10-04 ENCOUNTER — Encounter (HOSPITAL_COMMUNITY): Payer: Self-pay | Admitting: Certified Registered Nurse Anesthetist

## 2015-10-04 DIAGNOSIS — I120 Hypertensive chronic kidney disease with stage 5 chronic kidney disease or end stage renal disease: Secondary | ICD-10-CM | POA: Insufficient documentation

## 2015-10-04 DIAGNOSIS — E1151 Type 2 diabetes mellitus with diabetic peripheral angiopathy without gangrene: Secondary | ICD-10-CM | POA: Insufficient documentation

## 2015-10-04 DIAGNOSIS — Y832 Surgical operation with anastomosis, bypass or graft as the cause of abnormal reaction of the patient, or of later complication, without mention of misadventure at the time of the procedure: Secondary | ICD-10-CM | POA: Diagnosis not present

## 2015-10-04 DIAGNOSIS — E1122 Type 2 diabetes mellitus with diabetic chronic kidney disease: Secondary | ICD-10-CM | POA: Insufficient documentation

## 2015-10-04 DIAGNOSIS — K219 Gastro-esophageal reflux disease without esophagitis: Secondary | ICD-10-CM | POA: Insufficient documentation

## 2015-10-04 DIAGNOSIS — Z79899 Other long term (current) drug therapy: Secondary | ICD-10-CM | POA: Diagnosis not present

## 2015-10-04 DIAGNOSIS — N186 End stage renal disease: Secondary | ICD-10-CM | POA: Diagnosis present

## 2015-10-04 DIAGNOSIS — Z8546 Personal history of malignant neoplasm of prostate: Secondary | ICD-10-CM | POA: Insufficient documentation

## 2015-10-04 DIAGNOSIS — Z794 Long term (current) use of insulin: Secondary | ICD-10-CM | POA: Insufficient documentation

## 2015-10-04 DIAGNOSIS — F17200 Nicotine dependence, unspecified, uncomplicated: Secondary | ICD-10-CM | POA: Insufficient documentation

## 2015-10-04 DIAGNOSIS — Z992 Dependence on renal dialysis: Secondary | ICD-10-CM | POA: Insufficient documentation

## 2015-10-04 DIAGNOSIS — N185 Chronic kidney disease, stage 5: Secondary | ICD-10-CM | POA: Diagnosis not present

## 2015-10-04 DIAGNOSIS — T829XXA Unspecified complication of cardiac and vascular prosthetic device, implant and graft, initial encounter: Secondary | ICD-10-CM | POA: Insufficient documentation

## 2015-10-04 DIAGNOSIS — J449 Chronic obstructive pulmonary disease, unspecified: Secondary | ICD-10-CM | POA: Insufficient documentation

## 2015-10-04 DIAGNOSIS — Z7982 Long term (current) use of aspirin: Secondary | ICD-10-CM | POA: Insufficient documentation

## 2015-10-04 HISTORY — PX: AV FISTULA PLACEMENT: SHX1204

## 2015-10-04 LAB — GLUCOSE, CAPILLARY: GLUCOSE-CAPILLARY: 171 mg/dL — AB (ref 65–99)

## 2015-10-04 LAB — POCT I-STAT 4, (NA,K, GLUC, HGB,HCT)
GLUCOSE: 189 mg/dL — AB (ref 65–99)
HCT: 47 % (ref 39.0–52.0)
HEMOGLOBIN: 16 g/dL (ref 13.0–17.0)
Potassium: 4.7 mmol/L (ref 3.5–5.1)
Sodium: 136 mmol/L (ref 135–145)

## 2015-10-04 SURGERY — INSERTION OF ARTERIOVENOUS (AV) GORE-TEX GRAFT ARM
Anesthesia: General | Site: Arm Upper | Laterality: Left

## 2015-10-04 MED ORDER — LIDOCAINE HCL (CARDIAC) 20 MG/ML IV SOLN
INTRAVENOUS | Status: DC | PRN
  Administered 2015-10-04: 60 mg via INTRAVENOUS

## 2015-10-04 MED ORDER — SODIUM CHLORIDE 0.9 % IJ SOLN
INTRAMUSCULAR | Status: AC
  Filled 2015-10-04: qty 10

## 2015-10-04 MED ORDER — FENTANYL CITRATE (PF) 100 MCG/2ML IJ SOLN
INTRAMUSCULAR | Status: DC | PRN
  Administered 2015-10-04 (×4): 50 ug via INTRAVENOUS
  Administered 2015-10-04 (×2): 25 ug via INTRAVENOUS

## 2015-10-04 MED ORDER — PROMETHAZINE HCL 25 MG/ML IJ SOLN: 6.2500 mg | INTRAMUSCULAR | Status: DC | PRN

## 2015-10-04 MED ORDER — PROTAMINE SULFATE 10 MG/ML IV SOLN
INTRAVENOUS | Status: DC | PRN
  Administered 2015-10-04 (×2): 10 mg via INTRAVENOUS
  Administered 2015-10-04: 20 mg via INTRAVENOUS

## 2015-10-04 MED ORDER — MIDAZOLAM HCL 2 MG/2ML IJ SOLN: 0.5000 mg | Freq: Once | INTRAMUSCULAR | Status: DC | PRN

## 2015-10-04 MED ORDER — FENTANYL CITRATE (PF) 100 MCG/2ML IJ SOLN: 25.0000 ug | INTRAMUSCULAR | Status: DC | PRN

## 2015-10-04 MED ORDER — 0.9 % SODIUM CHLORIDE (POUR BTL) OPTIME
TOPICAL | Status: DC | PRN
Start: 2015-10-04 — End: 2015-10-04
  Administered 2015-10-04: 1000 mL

## 2015-10-04 MED ORDER — ROCURONIUM BROMIDE 50 MG/5ML IV SOLN
INTRAVENOUS | Status: AC
  Filled 2015-10-04: qty 1

## 2015-10-04 MED ORDER — SODIUM CHLORIDE 0.9 % IV SOLN
INTRAVENOUS | Status: DC | PRN
  Administered 2015-10-04: 12:00:00

## 2015-10-04 MED ORDER — LIDOCAINE HCL (PF) 1 % IJ SOLN
INTRAMUSCULAR | Status: DC | PRN
  Administered 2015-10-04: 5 mL

## 2015-10-04 MED ORDER — LIDOCAINE HCL (CARDIAC) 20 MG/ML IV SOLN
INTRAVENOUS | Status: AC
  Filled 2015-10-04: qty 5

## 2015-10-04 MED ORDER — PROPOFOL 10 MG/ML IV BOLUS
INTRAVENOUS | Status: AC
  Filled 2015-10-04: qty 20

## 2015-10-04 MED ORDER — FENTANYL CITRATE (PF) 250 MCG/5ML IJ SOLN
INTRAMUSCULAR | Status: AC
Start: 2015-10-04 — End: 2015-10-04
  Filled 2015-10-04: qty 5

## 2015-10-04 MED ORDER — EPHEDRINE SULFATE 50 MG/ML IJ SOLN
INTRAMUSCULAR | Status: AC
  Filled 2015-10-04: qty 1

## 2015-10-04 MED ORDER — PROPOFOL 10 MG/ML IV BOLUS
INTRAVENOUS | Status: DC | PRN
  Administered 2015-10-04: 80 mg via INTRAVENOUS

## 2015-10-04 MED ORDER — HEPARIN SODIUM (PORCINE) 1000 UNIT/ML IJ SOLN
INTRAMUSCULAR | Status: AC
  Filled 2015-10-04: qty 1

## 2015-10-04 MED ORDER — OXYCODONE HCL 5 MG PO TABS: 5.0000 mg | ORAL_TABLET | Freq: Four times a day (QID) | ORAL | Status: DC | PRN

## 2015-10-04 MED ORDER — ONDANSETRON HCL 4 MG/2ML IJ SOLN
INTRAMUSCULAR | Status: DC | PRN
  Administered 2015-10-04: 4 mg via INTRAVENOUS

## 2015-10-04 MED ORDER — LIDOCAINE HCL (PF) 1 % IJ SOLN
INTRAMUSCULAR | Status: AC
  Filled 2015-10-04: qty 30

## 2015-10-04 MED ORDER — HEPARIN SODIUM (PORCINE) 1000 UNIT/ML IJ SOLN
INTRAMUSCULAR | Status: DC | PRN
  Administered 2015-10-04: 8000 [IU] via INTRAVENOUS

## 2015-10-04 MED ORDER — MEPERIDINE HCL 25 MG/ML IJ SOLN: 6.2500 mg | INTRAMUSCULAR | Status: DC | PRN

## 2015-10-04 MED ORDER — HEMOSTATIC AGENTS (NO CHARGE) OPTIME
TOPICAL | Status: DC | PRN
  Administered 2015-10-04: 1 via TOPICAL

## 2015-10-04 SURGICAL SUPPLY — 39 items
AGENT HMST SPONGE THK3/8 (HEMOSTASIS) ×2
ARMBAND PINK RESTRICT EXTREMIT (MISCELLANEOUS) ×4 IMPLANT
CANISTER SUCTION 2500CC (MISCELLANEOUS) ×4 IMPLANT
CLIP TI MEDIUM 6 (CLIP) ×4 IMPLANT
CLIP TI WIDE RED SMALL 6 (CLIP) ×4 IMPLANT
COVER PROBE W GEL 5X96 (DRAPES) ×4 IMPLANT
DECANTER SPIKE VIAL GLASS SM (MISCELLANEOUS) ×4 IMPLANT
ELECT REM PT RETURN 9FT ADLT (ELECTROSURGICAL) ×4
ELECTRODE REM PT RTRN 9FT ADLT (ELECTROSURGICAL) ×2 IMPLANT
GAUZE SPONGE 4X4 12PLY STRL (GAUZE/BANDAGES/DRESSINGS) ×4 IMPLANT
GEL ULTRASOUND 20GR AQUASONIC (MISCELLANEOUS) IMPLANT
GLOVE BIO SURGEON STRL SZ7 (GLOVE) ×4 IMPLANT
GLOVE BIOGEL PI IND STRL 7.5 (GLOVE) ×2 IMPLANT
GLOVE BIOGEL PI INDICATOR 7.5 (GLOVE) ×2
GOWN STRL REUS W/ TWL LRG LVL3 (GOWN DISPOSABLE) ×6 IMPLANT
GOWN STRL REUS W/TWL LRG LVL3 (GOWN DISPOSABLE) ×12
GRAFT GORETEX STRT 4-7X45 (Vascular Products) ×3 IMPLANT
HEMOSTAT SPONGE AVITENE ULTRA (HEMOSTASIS) ×3 IMPLANT
KIT BASIN OR (CUSTOM PROCEDURE TRAY) ×4 IMPLANT
KIT ROOM TURNOVER OR (KITS) ×4 IMPLANT
LIQUID BAND (GAUZE/BANDAGES/DRESSINGS) ×4 IMPLANT
NS IRRIG 1000ML POUR BTL (IV SOLUTION) ×4 IMPLANT
PACK CV ACCESS (CUSTOM PROCEDURE TRAY) ×4 IMPLANT
PAD ARMBOARD 7.5X6 YLW CONV (MISCELLANEOUS) ×8 IMPLANT
SPONGE SURGIFOAM ABS GEL 100 (HEMOSTASIS) IMPLANT
SUT ETHILON 3 0 PS 1 (SUTURE) IMPLANT
SUT MNCRL AB 4-0 PS2 18 (SUTURE) ×8 IMPLANT
SUT PROLENE 5 0 C 1 24 (SUTURE) IMPLANT
SUT PROLENE 6 0 BV (SUTURE) ×17 IMPLANT
SUT PROLENE 7 0 BV 1 (SUTURE) IMPLANT
SUT SILK 0 TIES 10X30 (SUTURE) ×4 IMPLANT
SUT SILK 2 0 FS (SUTURE) ×4 IMPLANT
SUT VIC AB 3-0 SH 27 (SUTURE) ×8
SUT VIC AB 3-0 SH 27X BRD (SUTURE) ×4 IMPLANT
SUT VICRYL 4-0 PS2 18IN ABS (SUTURE) ×3 IMPLANT
SWAB COLLECTION DEVICE MRSA (MISCELLANEOUS) IMPLANT
TUBE ANAEROBIC SPECIMEN COL (MISCELLANEOUS) IMPLANT
UNDERPAD 30X30 INCONTINENT (UNDERPADS AND DIAPERS) ×4 IMPLANT
WATER STERILE IRR 1000ML POUR (IV SOLUTION) ×4 IMPLANT

## 2015-10-04 NOTE — Anesthesia Procedure Notes (Addendum)
Procedure Name: LMA Insertion Date/Time: 10/04/2015 11:52 AM Performed by: Salli Quarry Oluwakemi Salsberry Pre-anesthesia Checklist: Patient identified, Emergency Drugs available, Suction available and Patient being monitored Patient Re-evaluated:Patient Re-evaluated prior to inductionOxygen Delivery Method: Circle system utilized Preoxygenation: Pre-oxygenation with 100% oxygen Intubation Type: IV induction LMA: LMA flexible inserted LMA Size: 4.0 Number of attempts: 1 Placement Confirmation: positive ETCO2 and breath sounds checked- equal and bilateral Tube secured with: Tape Dental Injury: Teeth and Oropharynx as per pre-operative assessment

## 2015-10-04 NOTE — Anesthesia Preprocedure Evaluation (Addendum)
Anesthesia Evaluation  Patient identified by MRN, date of birth, ID band Patient awake    Reviewed: Allergy & Precautions, NPO status , Patient's Chart, lab work & pertinent test results  History of Anesthesia Complications Negative for: history of anesthetic complications  Airway Mallampati: I  TM Distance: >3 FB Neck ROM: Full    Dental  (+) Edentulous Upper, Edentulous Lower   Pulmonary COPD, Current Smoker,    breath sounds clear to auscultation       Cardiovascular hypertension, Pt. 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), Insulin Dependent, Oral Hypoglycemic Agents  Renal/GU Dialysis and ESRFRenal disease (MWF dialysis, K+ 4.7)   Prostate cancer    Musculoskeletal   Abdominal   Peds  Hematology negative hematology ROS (+)   Anesthesia Other Findings   Reproductive/Obstetrics                           Anesthesia Physical Anesthesia Plan  ASA: III  Anesthesia Plan: General   Post-op Pain Management:    Induction: Intravenous  Airway Management Planned: LMA  Additional Equipment:   Intra-op Plan:   Post-operative Plan:   Informed Consent: I have reviewed the patients History and Physical, chart, labs and discussed the procedure including the risks, benefits and alternatives for the proposed anesthesia with the patient or authorized representative who has indicated his/her understanding and acceptance.     Plan Discussed with: CRNA and Surgeon  Anesthesia Plan Comments: (Plan routine monitors, GA- LMA OK)        Anesthesia Quick Evaluation

## 2015-10-04 NOTE — H&P (View-Only) (Signed)
    Postoperative Access Visit   History of Present Illness  Gerald Davenport is a 75 y.o. year old male who presents for postoperative follow-up for: L 1st BRVT (Date: 05/2015).  The patient's wounds are healed.  The patient notes no steal symptoms.  The patient is able to complete their activities of daily living.  The patient's current symptoms are: none.  Reported HD tried to cannulate the fistula last week.  For VQI Use Only  PRE-ADM LIVING: Home  AMB STATUS: Ambulatory  Physical Examination Filed Vitals:   09/28/15 1158  BP: 116/49  Pulse: 85  Temp: 97.4 F (36.3 C)  Resp: 16   Pulmonary: Sym exp, good air movt, CTAB, no rales, rhonchi, & wheezing  Cardiac: RRR, Nl S1, S2, no Murmurs, rubs or gallops  LUE: Incision is healed, skin feels warm hand grip is 5/5, sensation in digits is intact, palpable thrill, bruit can be auscultated: wheezy, on Sonosite: fistula 3-4 mm  Medical Decision Making  Gerald Davenport is a 75 y.o. year old male who presents s/p failed L first stage BRVT, ESRD on HD.   Fistula is in the process of failing so would proceed with Ligation of L 1st stage BRVT, LUA AVG placement  This is scheduled for 5 JAN 17 Risk, benefits, and alternatives to access surgery were discussed.  The patient is aware the risks include but are not limited to: bleeding, infection, steal syndrome, nerve damage, ischemic monomelic neuropathy, failure to mature, need for additional procedures, death and stroke.   The patient agrees to proceed forward with the procedure.  Thank you for allowing [place] to participate in this patient's care.  Adele Barthel, MD [facility] [place] Office: [phone] Pager: [phone]  09/28/2015, 12:15 PM

## 2015-10-04 NOTE — Transfer of Care (Signed)
Immediate Anesthesia Transfer of Care Note  Patient: Gerald Davenport  Procedure(s) Performed: Procedure(s): INSERTION OF ARTERIOVENOUS (AV) GORE-TEX GRAFT ARM (Left)  Patient Location: PACU  Anesthesia Type:General  Level of Consciousness: awake, alert , oriented and patient cooperative  Airway & Oxygen Therapy: Patient Spontanous Breathing and Patient connected to nasal cannula oxygen  Post-op Assessment: Report given to RN and Post -op Vital signs reviewed and stable  Post vital signs: Reviewed and stable  Last Vitals:  Filed Vitals:   10/04/15 1008  BP: 105/58  Pulse: 78  Temp: 36.1 C  Resp: 16    Complications: No apparent anesthesia complications

## 2015-10-04 NOTE — Anesthesia Postprocedure Evaluation (Signed)
Anesthesia Post Note  Patient: Gerald Davenport  Procedure(s) Performed: Procedure(s) (LRB): INSERTION OF ARTERIOVENOUS (AV) GORE-TEX GRAFT ARM (Left)  Patient location during evaluation: PACU Anesthesia Type: General Level of consciousness: awake and alert, oriented and patient cooperative Pain management: pain level controlled Vital Signs Assessment: post-procedure vital signs reviewed and stable Respiratory status: spontaneous breathing, nonlabored ventilation, respiratory function stable and patient connected to nasal cannula oxygen Cardiovascular status: blood pressure returned to baseline and stable Postop Assessment: no signs of nausea or vomiting Anesthetic complications: no    Last Vitals:  Filed Vitals:   10/04/15 1415 10/04/15 1430  BP:  107/31  Pulse: 71 69  Temp:    Resp: 12 13    Last Pain:  Filed Vitals:   10/04/15 1431  PainSc: 0-No pain                 Eileen Kangas,E. Winnie Umali

## 2015-10-04 NOTE — Progress Notes (Signed)
dailaysis assess record faxed to [facility]

## 2015-10-04 NOTE — Interval H&P Note (Signed)
History and Physical Interval Note:  10/04/2015 9:38 AM  Gerald Davenport  has presented today for surgery, with the diagnosis of End Stage Renal Disease N18.6  The various methods of treatment have been discussed with the patient and family. After consideration of risks, benefits and other options for treatment, the patient has consented to  Procedure(s): LIGATION OF BRACHIAL VEIN TRANSPOSITION (Left) INSERTION OF ARTERIOVENOUS (AV) GORE-TEX GRAFT ARM (Left) as a surgical intervention .  The patient's history has been reviewed, patient examined, no change in status, stable for surgery.  I have reviewed the patient's chart and labs.  Questions were answered to the patient's satisfaction.     Adele Barthel

## 2015-10-04 NOTE — Op Note (Signed)
OPERATIVE NOTE   PROCEDURE:  left upper arm looped (brachiobrachial) arteriovenous graft   PRE-OPERATIVE DIAGNOSIS: end stage renal disease   POST-OPERATIVE DIAGNOSIS: same as above   SURGEON: Adele Barthel, MD  ASSISTANT(S): Gerri Lins, PAC   ANESTHESIA: general  ESTIMATED BLOOD LOSS: 50 cc  FINDING(S): 1. Faintly palpable thrill with pulsatile character at end of case 2. Dopplerable radial signal at end of case.  SPECIMEN(S):  none  INDICATIONS:   Gerald Davenport is a 75 y.o. male who presents with end stage renal disease and failed left first stage brachial vein transposition.  Risk, benefits, and alternatives to access surgery were discussed.  The patient is aware the risks include but are not limited to: bleeding, infection, steal syndrome, nerve damage, ischemic monomelic neuropathy, failure to mature, and need for additional procedures.  The patient is aware of the risks and elects to proceed forward.  DESCRIPTION: After full informed written consent was obtained from the patient, the patient was brought back to the operating room and placed supine upon the operating table.  The patient was given IV antibiotics prior to proceeding.  After obtaining adequate sedation, the patient was prepped and draped in standard fashion for a left arm access procedure.  I turned my attention first to the antecubitum.  Under ultrasound guidance, I identified the location of the brachial artery at the antecubitum and brachial vein at the level of axillary and marked it on the skin.  I injected 10 cc of lidocaine over the axillary and antecubital site.  I made an longitudinal incision over the brachial artery, and dissected down to the subcutaneous tissue.  There was extensive scar tissue without any palpable brachial pulse.  There appeared to be an occluded vein in this incision, suggesting this was the prior basilic vein transposition rather than the prior brachial vein transposition.  I  reapproximated the subcutaneous tissue with 3-0 Vicryl and then reapproximated the skin with a running stitch of 4-0 Monocryl.  I then identified a faint incision posterior to the prior incision.  I could doppler a brachial artery here.  I made an incision over the prior incision.  Again, there was severe scar tissue here.  I could identify the brachial artery but it did not have pulse, so I felt that it was not adequate to support an arteriovenous graft.    I turned my attention to the axilla.  I made an incision and dissected down to the brachial artery and vein.  The artery was 4-5 mm in diameter with obvious wall changes consistent with atherosclerosis.  I dissected out a segment and placed a vessel loop around it.  I then dissected out the high brachial vein.  Externally, it appeared to be 6-7 mm in diameter.  I then dissected this vein proximally and distal.  I then marked a loop out on the upper arm for the graft.  I planned on routing the arterial limb of the graft parallel to the brachial artery and then looping the venous limb over the bicep.  I took a Dietitian and dissected from the axillary incision to the apical incision.  Then I delivered the 4 x 7-mm stretch Gore-Tex graft, through this metal tunneler and then pulled out the metal tunneler leaving the graft in place with the 4 mm end in the axillary incision.  I then dissected from the antecubital incision to the axillary incision with the metal Gore tunneler and deliver the graft through the metal  tunnel.  I removed the tunnel, leaving the graft in place.  I pulled out the slack in the graft.    I then gave the patient 8000 units of heparin to gain anticoagulation.  After waiting 3 minutes, I clamped the high brachial artery proximally and distally.  I made an arteriotomy and extended it with a Potts scissor.  I sewed the 4-mm end of the graft to this arteriotomy with a running stitch of 6-0 Prolene.  I released the clamps and allowed  the artery to decompress through the graft. There was good pulsatile bleeding through this graft.  I clamped the graft in the antecubital incision and sucked out all the blood in the graft and loaded the graft with heparinized saline.  At this point, I pulled the graft to appropriate length and reset my exposure of the high brachial vein.  I tied off the high brachial vein distally with a 2-0 silk and then transected it.  There was limited venous backbleeding from the vein.  Then, I injected some heparinized saline into this vein and then clamped it.  I then spatulated the vein to facilitate an end-to- end anastomosis.  I also spatulated the graft to facilitate an end-to-end anastomosis.  In the process of spatulating, I cut the graft to appropriate length for this anastomosis.  This graft was sewn to the vein in an end-to-end configuration with a 6-0 Prolene.  Prior to completing this anastomosis, I allowed the vein to back bleed and then I also allowed the artery to bleed in an antegrade fashion.  I completed this anastomosis in the usual fashion, and then irrigated out the high bicipital exposure and then placed Avitene.   I then turned my attention back to the antecubitum.  I packed in this incision with Avitene.  The distal radial artery was dopplerable.  It did not augment much with compressing the outflow.  The venous outflow had a flow signature consistent with widely patent arteriovenous graft and also a pulsatile character.  It is not clear to me if the pulsatile character reflects the underlying brachial artery.  At this point, I washed out both incisions and placed AviteneAfter waiting a few minutes, there was no more active bleeding.  I washed out both incision and removed the gelfoam.    Both axillary and antecubital incision was repaired with a layer of 3-0 Vicryl in the subcutaneous tissue and a layer of 4-0 Monocryl in the skin.  The skin was cleaned, dried, and reinforce with  Dermabond.   COMPLICATIONS: none  CONDITION: stable   Adele Barthel, MD [facility] Office: [phone] Pager: [phone]  10/04/2015, 1:51 PM

## 2015-10-05 ENCOUNTER — Encounter (HOSPITAL_COMMUNITY): Payer: Self-pay | Admitting: Vascular Surgery

## 2015-10-05 ENCOUNTER — Telehealth: Payer: Self-pay | Admitting: Vascular Surgery

## 2015-10-05 NOTE — Telephone Encounter (Signed)
LM for pt re appt, dpm °

## 2015-10-05 NOTE — Telephone Encounter (Signed)
-----   Message from Mena Goes, RN sent at 10/04/2015  3:02 PM EST ----- Regarding: schedule   ----- Message -----    From: Conrad [place], MD    Sent: 10/04/2015   2:05 PM      To: [address]  JAEGER ARNESON [id number] [birth year]  PROCEDURE:  left upper arm looped (brachiobrachial) arteriovenous graft   Asst: Gerri Lins, [facility]   Follow-up: 4 weeks

## 2015-10-12 ENCOUNTER — Encounter (HOSPITAL_COMMUNITY): Payer: Self-pay | Admitting: *Deleted

## 2015-10-12 ENCOUNTER — Encounter: Payer: Self-pay | Admitting: Vascular Surgery

## 2015-10-12 ENCOUNTER — Ambulatory Visit (INDEPENDENT_AMBULATORY_CARE_PROVIDER_SITE_OTHER): Payer: Medicare Other | Admitting: Vascular Surgery

## 2015-10-12 ENCOUNTER — Other Ambulatory Visit: Payer: Self-pay

## 2015-10-12 VITALS — BP 134/66 | HR 90 | Temp 97.0°F | Ht 69.0 in | Wt 164.3 lb

## 2015-10-12 DIAGNOSIS — Z992 Dependence on renal dialysis: Secondary | ICD-10-CM

## 2015-10-12 DIAGNOSIS — N186 End stage renal disease: Secondary | ICD-10-CM

## 2015-10-12 DIAGNOSIS — T827XXA Infection and inflammatory reaction due to other cardiac and vascular devices, implants and grafts, initial encounter: Secondary | ICD-10-CM | POA: Insufficient documentation

## 2015-10-12 MED ORDER — DEXTROSE 5 % IV SOLN
1.5000 g | INTRAVENOUS | Status: AC
  Administered 2015-10-13: 1.5 g via INTRAVENOUS
  Filled 2015-10-12: qty 1.5

## 2015-10-12 MED ORDER — CHLORHEXIDINE GLUCONATE CLOTH 2 % EX PADS: 6.0000 | MEDICATED_PAD | Freq: Once | CUTANEOUS | Status: DC

## 2015-10-12 MED ORDER — SODIUM CHLORIDE 0.9 % IV SOLN
INTRAVENOUS | Status: DC
  Administered 2015-10-13: 10:00:00 via INTRAVENOUS

## 2015-10-12 NOTE — Progress Notes (Signed)
Postoperative Access Visit   History of Present Illness  Gerald Davenport is a 75 y.o. year old male who presents for postoperative follow-up for: LUA looped AVG (Date: 10/04/15).  The pt was sent from HD with drainage from his distal incision and concern with infection.  The patient notes increasing pain in his distal arm.  The patient notes no steal symptoms.  The patient is not able to complete their activities of daily living.  The patient's current symptoms are: drainage of clear fluid from distal incisiosn.  Past Medical History  Diagnosis Date  . Hypertension   . Hyperlipidemia   . Cancer [facility])     Prostate cancer treated with radiation therapy  . Thyroid disease     hyperparathyroidism  . Anemia   . Positive PPD   . Clot 05/2006    Arterial clot right leg   . Hemodialysis patient ([place]) 6/13  . Diverticulosis   . Anxiety   . Chronic kidney disease     dialysis Mon, Wednes, Fri  . Shortness of breath dyspnea     with exertion, when he has too much fluid  . Diabetes mellitus     Type II  . Constipation   . GERD (gastroesophageal reflux disease)     Past Surgical History  Procedure Laterality Date  . 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 Av fistula placement  11/04/2010    left and left  . Av fistula placement  11/20/2011    Procedure: INSERTION OF ARTERIOVENOUS (AV) GORE-TEX GRAFT ARM;  Surgeon: Angelia Mould, MD;  [facility];  Service: Vascular;  Laterality: Right;  . Colonoscopy N/A 11/10/2012    [id number] proctitis/Pancolonic diverticulosis/(TUBULAR ADENOMA)5 mm polyp in the transverse segment/4 mm polyps in the base of the cecum and diminutive polyp in the descending segment. INADEQUATE BOWEL PREP  . Colonoscopy N/A 11/28/2013    RMR: Radiation proctitis. Colonic diverticulosis. Colonic polyps-removed as described above.   [name] N/A 10/08/2012    Procedure: [name];  Surgeon: Elam Dutch, MD;  Location: [facility] CATH LAB;  Service:  Cardiovascular;  Laterality: N/A;  . Av fistula placement Right 04/19/2015    Procedure: Excision of infected RUA AVG;  Surgeon: Conrad [place], MD;  Location: [place];  Service: Vascular;  Laterality: Right;  . Bascilic vein transposition Left 06/19/2015    Procedure: FIRST STAGE BRACHIAL VEIN TRANSPOSITION;  Surgeon: Conrad Domino, MD;  Location: [place];  Service: Vascular;  Laterality: Left;  . Av fistula placement Left 10/04/2015    Procedure: INSERTION OF ARTERIOVENOUS (AV) GORE-TEX GRAFT ARM;  Surgeon: Conrad [place], MD;  Location: [place];  Service: Vascular;  Laterality: Left;    Social History   Social History  . Marital Status: Married    Spouse Name: N/A  . Number of Children: N/A  . Years of Education: N/A   Occupational History  . Not on file.   Social History Main Topics  . Smoking status: Current Every Day Smoker -- 0.25 packs/day for 50 years    Types: Cigarettes  . Smokeless tobacco: Never Used     Comment: 4-5 cigarettes daily  . Alcohol Use: No  . Drug Use: No  . Sexual Activity: Not on file   Other Topics Concern  . Not on file   Social History Narrative    Family History  Problem Relation Age of Onset  . Heart disease Mother   . Diabetes  Mother   . Hypertension Mother   . Stroke Mother   . Heart disease Father   . Diabetes Father   . Heart disease Sister   . Diabetes Brother   . Colon cancer Neg Hx     Current Outpatient Prescriptions  Medication Sig Dispense Refill  . ACCU-CHEK AVIVA PLUS test strip     . aspirin EC 81 MG tablet Take 81-162 mg by mouth 2 (two) times daily. Take 2 tablets (162 mg) by mouth every morning and 1 tablet (81 mg) at night    . buPROPion (WELLBUTRIN XL) 150 MG 24 hr tablet Take 150 mg by mouth daily.    . calcium acetate (PHOSLO) 667 MG capsule Take 667-1,334 mg by mouth See admin instructions. Take 2 capsules (1334 mg) by mouth with breakfast and with supper, take 1 capsule (667 mg) with snacks or small meals    . cloNIDine  (CATAPRES) 0.2 MG tablet Take 0.2 mg by mouth 3 (three) times daily.     Gerald Davenport Kitchen docusate sodium (COLACE) 100 MG capsule Take 1 capsule (100 mg total) by mouth 2 (two) times daily. (Patient taking differently: Take 100 mg by mouth See admin instructions. Take 1 capsule (100 mg) by mouth on Saturday, Tuesday, Thursday, Sunday (non-dialysis days)) 10 capsule 0  . gabapentin (NEURONTIN) 100 MG capsule Take 100 mg by mouth 2 (two) times daily.    . insulin detemir (LEVEMIR) 100 UNIT/ML injection Inject 25 Units into the skin at bedtime.     . Linaclotide (LINZESS) 145 MCG CAPS capsule Take 1 capsule (145 mcg total) by mouth daily. Take 30 minutes before breakfast (Patient taking differently: Take 145 mcg by mouth See admin instructions. Take 30 minutes before breakfast on Sunday, Tuesday, Thursday, Saturday (non-dialysis days)) 30 capsule 11  . omeprazole (PRILOSEC) 20 MG capsule Take 20 mg by mouth 2 (two) times daily before a meal.     . oxyCODONE (ROXICODONE) 5 MG immediate release tablet Take 1 tablet (5 mg total) by mouth every 6 (six) hours as needed. 20 tablet 0  . Polyvinyl Alcohol-Povidone (REFRESH OP) Place 1 drop into both eyes at bedtime.    Gerald Davenport Kitchen QUEtiapine (SEROQUEL) 100 MG tablet Take 100 mg by mouth at bedtime.    . saxagliptin HCl (ONGLYZA) 5 MG TABS tablet Take 5 mg by mouth daily with breakfast.     . calcium acetate (PHOSLO) 667 MG capsule     . LEVEMIR FLEXTOUCH 100 UNIT/ML Pen      No current facility-administered medications for this visit.     No Known Allergies   REVIEW OF SYSTEMS:  (Positives checked otherwise negative)  CARDIOVASCULAR:   [ ]  chest pain,  [ ]  chest pressure,  [ ]  palpitations,  [ ]  shortness of breath when laying flat,  [ ]  shortness of breath with exertion,   [ ]  pain in feet when walking,  [ ]  pain in feet when laying flat, [ ]  history of blood clot in veins (DVT),  [ ]  history of phlebitis,  [ ]  swelling in legs,  [ ]  varicose veins  PULMONARY:   [ ]   productive cough,  [ ]  asthma,  [ ]  wheezing  NEUROLOGIC:   [ ]  weakness in arms or legs,  [ ]  numbness in arms or legs,  [ ]  difficulty speaking or slurred speech,  [ ]  temporary loss of vision in one eye,  [ ]  dizziness  HEMATOLOGIC:   [ ]  bleeding problems,  [ ]   problems with blood clotting too easily  MUSCULOSKEL:   [ ]  joint pain, [ ]  joint swelling  GASTROINTEST:   [ ]  vomiting blood,  [ ]  blood in stool     GENITOURINARY:   [ ]  burning with urination,  [ ]  blood in urine [x]  ESRD-HD: M-W-F  PSYCHIATRIC:   [ ]  history of major depression  INTEGUMENTARY:   [ ]  rashes,  [ ]  ulcers  CONSTITUTIONAL:   [ ]  fever,  [ ]  chills    For VQI Use Only  PRE-ADM LIVING: Home  AMB STATUS: Ambulatory  Physical Examination Filed Vitals:   10/12/15 1127  BP: 134/66  Pulse: 90  Temp: 97 F (36.1 C)   Pulmonary: Sym exp, good air movt, CTAB, no rales, rhonchi, & wheezing  Cardiac: RRR, Nl S1, S2, no Murmurs, rubs or gallops  LUE: mild separation of axillary incision, moderate separation of antecubital incision, erythematous distal arm, warm hand, intact hang grip with sensation, severe TTP to light touch overlying route of graft, weak bruit, no thrill  Medical Decision Making  KRU NOVICK is a 75 y.o. year old male who presents s/p LUA looped AVG complicated with possible imminent thrombosis and infection.   I have had concerns with the quality of his arterial flow in his left arm from his prior procedures.  Despite arterial doppler with triphasic flow, this patient's inflow appears to be inadequate to support an AVG   As the graft isn't likely useful at this point, would go ahead and remove it tomorrow, in case its infected.  Thank you for allowing [place] to participate in this patient's care.  Adele Barthel, MD [facility] Office: [phone] Pager: [phone]  10/12/2015, 11:43 AM

## 2015-10-12 NOTE — Progress Notes (Signed)
Pt denies any acute cardiopulmonary symptoms. Pt denies being under the care of a cardiologist and denies having a cardiac cath. Pt was given all instructions including DM protocol for BS < 70 and >220, pt stated that he was instructed to take 12 units of Levemir tonight instead of the usual 25 ( per MD)  and pt is to take no diabetic pills in the A.M.,(ONGLYZA)  only the pills instructed to take. Pt  made aware to stop taking otc vitamins, herbal medications and fish oil. Pt verbalized understanding of all pre-op instructions.

## 2015-10-13 ENCOUNTER — Encounter (HOSPITAL_COMMUNITY)
Admission: AD | Disposition: A | Discharge status: Home Health | Payer: Self-pay | Source: Ambulatory Visit | Attending: Vascular Surgery

## 2015-10-13 ENCOUNTER — Ambulatory Visit (HOSPITAL_COMMUNITY): Payer: Medicare Other | Admitting: Anesthesiology

## 2015-10-13 ENCOUNTER — Encounter (HOSPITAL_COMMUNITY): Payer: Self-pay | Admitting: *Deleted

## 2015-10-13 ENCOUNTER — Inpatient Hospital Stay (HOSPITAL_COMMUNITY)
Admission: AD | Admit: 2015-10-13 | Discharge: 2015-10-17 | DRG: 252 | Disposition: A | Discharge status: Home Health | Payer: Medicare Other | Source: Ambulatory Visit | Attending: Vascular Surgery | Admitting: Vascular Surgery

## 2015-10-13 DIAGNOSIS — E213 Hyperparathyroidism, unspecified: Secondary | ICD-10-CM | POA: Diagnosis present

## 2015-10-13 DIAGNOSIS — T82898A Other specified complication of vascular prosthetic devices, implants and grafts, initial encounter: Secondary | ICD-10-CM | POA: Diagnosis not present

## 2015-10-13 DIAGNOSIS — Y832 Surgical operation with anastomosis, bypass or graft as the cause of abnormal reaction of the patient, or of later complication, without mention of misadventure at the time of the procedure: Secondary | ICD-10-CM | POA: Diagnosis present

## 2015-10-13 DIAGNOSIS — Z8249 Family history of ischemic heart disease and other diseases of the circulatory system: Secondary | ICD-10-CM

## 2015-10-13 DIAGNOSIS — T827XXA Infection and inflammatory reaction due to other cardiac and vascular devices, implants and grafts, initial encounter: Secondary | ICD-10-CM | POA: Diagnosis not present

## 2015-10-13 DIAGNOSIS — Z89511 Acquired absence of right leg below knee: Secondary | ICD-10-CM

## 2015-10-13 DIAGNOSIS — D62 Acute posthemorrhagic anemia: Secondary | ICD-10-CM | POA: Diagnosis not present

## 2015-10-13 DIAGNOSIS — I120 Hypertensive chronic kidney disease with stage 5 chronic kidney disease or end stage renal disease: Secondary | ICD-10-CM | POA: Diagnosis present

## 2015-10-13 DIAGNOSIS — B999 Unspecified infectious disease: Secondary | ICD-10-CM | POA: Diagnosis present

## 2015-10-13 DIAGNOSIS — F329 Major depressive disorder, single episode, unspecified: Secondary | ICD-10-CM | POA: Diagnosis present

## 2015-10-13 DIAGNOSIS — Z992 Dependence on renal dialysis: Secondary | ICD-10-CM | POA: Diagnosis not present

## 2015-10-13 DIAGNOSIS — Z8546 Personal history of malignant neoplasm of prostate: Secondary | ICD-10-CM

## 2015-10-13 DIAGNOSIS — M79602 Pain in left arm: Secondary | ICD-10-CM | POA: Diagnosis present

## 2015-10-13 DIAGNOSIS — K5900 Constipation, unspecified: Secondary | ICD-10-CM | POA: Diagnosis present

## 2015-10-13 DIAGNOSIS — Z7982 Long term (current) use of aspirin: Secondary | ICD-10-CM | POA: Diagnosis not present

## 2015-10-13 DIAGNOSIS — Z833 Family history of diabetes mellitus: Secondary | ICD-10-CM

## 2015-10-13 DIAGNOSIS — R7611 Nonspecific reaction to tuberculin skin test without active tuberculosis: Secondary | ICD-10-CM | POA: Diagnosis present

## 2015-10-13 DIAGNOSIS — E782 Mixed hyperlipidemia: Secondary | ICD-10-CM | POA: Diagnosis present

## 2015-10-13 DIAGNOSIS — D631 Anemia in chronic kidney disease: Secondary | ICD-10-CM | POA: Diagnosis present

## 2015-10-13 DIAGNOSIS — E1122 Type 2 diabetes mellitus with diabetic chronic kidney disease: Secondary | ICD-10-CM | POA: Diagnosis not present

## 2015-10-13 DIAGNOSIS — Z923 Personal history of irradiation: Secondary | ICD-10-CM | POA: Diagnosis not present

## 2015-10-13 DIAGNOSIS — N186 End stage renal disease: Secondary | ICD-10-CM | POA: Diagnosis not present

## 2015-10-13 DIAGNOSIS — Z794 Long term (current) use of insulin: Secondary | ICD-10-CM | POA: Diagnosis not present

## 2015-10-13 DIAGNOSIS — F17210 Nicotine dependence, cigarettes, uncomplicated: Secondary | ICD-10-CM | POA: Diagnosis present

## 2015-10-13 DIAGNOSIS — F419 Anxiety disorder, unspecified: Secondary | ICD-10-CM | POA: Diagnosis present

## 2015-10-13 DIAGNOSIS — K219 Gastro-esophageal reflux disease without esophagitis: Secondary | ICD-10-CM | POA: Diagnosis present

## 2015-10-13 DIAGNOSIS — Z419 Encounter for procedure for purposes other than remedying health state, unspecified: Secondary | ICD-10-CM

## 2015-10-13 DIAGNOSIS — E1142 Type 2 diabetes mellitus with diabetic polyneuropathy: Secondary | ICD-10-CM | POA: Diagnosis present

## 2015-10-13 HISTORY — PX: AVGG REMOVAL: SHX5153

## 2015-10-13 HISTORY — DX: Unspecified infectious disease: B99.9

## 2015-10-13 LAB — GLUCOSE, CAPILLARY
GLUCOSE-CAPILLARY: 70 mg/dL (ref 65–99)
GLUCOSE-CAPILLARY: 43 mg/dL — AB (ref 65–99)
Glucose-Capillary: 74 mg/dL (ref 65–99)
Glucose-Capillary: 55 mg/dL — ABNORMAL LOW (ref 65–99)
Glucose-Capillary: 148 mg/dL — ABNORMAL HIGH (ref 65–99)
Glucose-Capillary: 201 mg/dL — ABNORMAL HIGH (ref 65–99)

## 2015-10-13 LAB — POCT I-STAT 4, (NA,K, GLUC, HGB,HCT)
GLUCOSE: 86 mg/dL (ref 65–99)
HCT: 36 % — ABNORMAL LOW (ref 39.0–52.0)
HEMOGLOBIN: 12.2 g/dL — AB (ref 13.0–17.0)
POTASSIUM: 4.2 mmol/L (ref 3.5–5.1)
Sodium: 137 mmol/L (ref 135–145)

## 2015-10-13 LAB — COMPREHENSIVE METABOLIC PANEL
ALK PHOS: 72 U/L (ref 38–126)
ALT: 11 U/L — ABNORMAL LOW (ref 17–63)
ANION GAP: 16 — AB (ref 5–15)
AST: 17 U/L (ref 15–41)
Albumin: 3 g/dL — ABNORMAL LOW (ref 3.5–5.0)
BILIRUBIN TOTAL: 0.5 mg/dL (ref 0.3–1.2)
BUN: 48 mg/dL — ABNORMAL HIGH (ref 6–20)
CALCIUM: 7.6 mg/dL — AB (ref 8.9–10.3)
CO2: 24 mmol/L (ref 22–32)
Chloride: 95 mmol/L — ABNORMAL LOW (ref 101–111)
Creatinine, Ser: 12.41 mg/dL — ABNORMAL HIGH (ref 0.61–1.24)
GFR calc non Af Amer: 3 mL/min — ABNORMAL LOW (ref >60)
GFR, EST AFRICAN AMERICAN: 4 mL/min — AB (ref >60)
Glucose, Bld: 169 mg/dL — ABNORMAL HIGH (ref 65–99)
Potassium: 4.8 mmol/L (ref 3.5–5.1)
Sodium: 135 mmol/L (ref 135–145)
TOTAL PROTEIN: 6.7 g/dL (ref 6.5–8.1)

## 2015-10-13 LAB — CBC
HEMATOCRIT: 36.1 % — AB (ref 39.0–52.0)
HEMOGLOBIN: 11.8 g/dL — AB (ref 13.0–17.0)
MCH: 36.1 pg — AB (ref 26.0–34.0)
MCHC: 32.7 g/dL (ref 30.0–36.0)
MCV: 110.4 fL — ABNORMAL HIGH (ref 78.0–100.0)
Platelets: 211 10*3/uL (ref 150–400)
RBC: 3.27 MIL/uL — ABNORMAL LOW (ref 4.22–5.81)
RDW: 18.6 % — ABNORMAL HIGH (ref 11.5–15.5)
WBC: 11.4 10*3/uL — ABNORMAL HIGH (ref 4.0–10.5)

## 2015-10-13 LAB — PROTIME-INR
INR: 1.03 (ref 0.00–1.49)
PROTHROMBIN TIME: 13.7 s (ref 11.6–15.2)

## 2015-10-13 SURGERY — REMOVAL OF ARTERIOVENOUS GORETEX GRAFT (AVGG)
Anesthesia: Monitor Anesthesia Care | Site: Arm Upper | Laterality: Left

## 2015-10-13 MED ORDER — GABAPENTIN 100 MG PO CAPS
100.0000 mg | ORAL_CAPSULE | Freq: Two times a day (BID) | ORAL | Status: DC
  Administered 2015-10-13 – 2015-10-17 (×8): 100 mg via ORAL
  Filled 2015-10-13 (×8): qty 1

## 2015-10-13 MED ORDER — VANCOMYCIN HCL 10 G IV SOLR
1500.0000 mg | Freq: Once | INTRAVENOUS | Status: AC
  Administered 2015-10-13: 1500 mg via INTRAVENOUS
  Filled 2015-10-13: qty 1500

## 2015-10-13 MED ORDER — ASPIRIN EC 81 MG PO TBEC
81.0000 mg | DELAYED_RELEASE_TABLET | Freq: Every day | ORAL | Status: DC
  Administered 2015-10-13: 81 mg via ORAL
  Filled 2015-10-13: qty 1

## 2015-10-13 MED ORDER — HYDRALAZINE HCL 20 MG/ML IJ SOLN: 5.0000 mg | INTRAMUSCULAR | Status: DC | PRN

## 2015-10-13 MED ORDER — PIPERACILLIN-TAZOBACTAM IN DEX 2-0.25 GM/50ML IV SOLN
2.2500 g | Freq: Three times a day (TID) | INTRAVENOUS | Status: DC
  Administered 2015-10-13 – 2015-10-17 (×12): 2.25 g via INTRAVENOUS
  Filled 2015-10-13 (×14): qty 50

## 2015-10-13 MED ORDER — GLUCOSE 40 % PO GEL
ORAL | Status: AC
  Administered 2015-10-13: 37.5 g
  Filled 2015-10-13: qty 1

## 2015-10-13 MED ORDER — LABETALOL HCL 5 MG/ML IV SOLN: 10.0000 mg | INTRAVENOUS | Status: DC | PRN

## 2015-10-13 MED ORDER — QUETIAPINE FUMARATE 25 MG PO TABS
100.0000 mg | ORAL_TABLET | Freq: Every day | ORAL | Status: DC
  Administered 2015-10-13 – 2015-10-16 (×4): 100 mg via ORAL
  Filled 2015-10-13 (×4): qty 4

## 2015-10-13 MED ORDER — GUAIFENESIN-DM 100-10 MG/5ML PO SYRP: 15.0000 mL | ORAL_SOLUTION | ORAL | Status: DC | PRN

## 2015-10-13 MED ORDER — SODIUM CHLORIDE 0.9 % IV SOLN
INTRAVENOUS | Status: DC | PRN
  Administered 2015-10-13: 500 mL

## 2015-10-13 MED ORDER — ALUM & MAG HYDROXIDE-SIMETH 200-200-20 MG/5ML PO SUSP: 15.0000 mL | ORAL | Status: DC | PRN

## 2015-10-13 MED ORDER — CALCITRIOL 0.25 MCG PO CAPS
0.2500 ug | ORAL_CAPSULE | ORAL | Status: DC
  Administered 2015-10-15 – 2015-10-17 (×2): 0.25 ug via ORAL
  Filled 2015-10-13 (×2): qty 1

## 2015-10-13 MED ORDER — PHENOL 1.4 % MT LIQD: 1.0000 | OROMUCOSAL | Status: DC | PRN

## 2015-10-13 MED ORDER — SODIUM CHLORIDE 0.9 % IJ SOLN
3.0000 mL | Freq: Two times a day (BID) | INTRAMUSCULAR | Status: DC
  Administered 2015-10-14 – 2015-10-17 (×6): 3 mL via INTRAVENOUS

## 2015-10-13 MED ORDER — SODIUM CHLORIDE 0.9 % IV SOLN
62.5000 mg | INTRAVENOUS | Status: DC
  Administered 2015-10-17: 62.5 mg via INTRAVENOUS
  Filled 2015-10-13 (×2): qty 5

## 2015-10-13 MED ORDER — POTASSIUM CHLORIDE CRYS ER 20 MEQ PO TBCR: 20.0000 meq | EXTENDED_RELEASE_TABLET | Freq: Once | ORAL | Status: DC

## 2015-10-13 MED ORDER — POLYVINYL ALCOHOL 1.4 % OP SOLN
1.0000 [drp] | Freq: Every day | OPHTHALMIC | Status: DC
  Administered 2015-10-13 – 2015-10-15 (×3): 1 [drp] via OPHTHALMIC
  Filled 2015-10-13: qty 15

## 2015-10-13 MED ORDER — SODIUM CHLORIDE 0.9 % IR SOLN
Status: DC | PRN
  Administered 2015-10-13: 1000 mL

## 2015-10-13 MED ORDER — LINACLOTIDE 145 MCG PO CAPS
145.0000 ug | ORAL_CAPSULE | ORAL | Status: DC
Start: 2015-10-13 — End: 2015-10-17
  Administered 2015-10-14 – 2015-10-16 (×2): 145 ug via ORAL
  Filled 2015-10-13 (×3): qty 1

## 2015-10-13 MED ORDER — ONDANSETRON HCL 4 MG/2ML IJ SOLN
INTRAMUSCULAR | Status: AC
  Filled 2015-10-13: qty 2

## 2015-10-13 MED ORDER — METOPROLOL TARTRATE 1 MG/ML IV SOLN: 2.0000 mg | INTRAVENOUS | Status: DC | PRN

## 2015-10-13 MED ORDER — ONDANSETRON HCL 4 MG/2ML IJ SOLN: 4.0000 mg | Freq: Four times a day (QID) | INTRAMUSCULAR | Status: DC | PRN

## 2015-10-13 MED ORDER — VANCOMYCIN HCL IN DEXTROSE 750-5 MG/150ML-% IV SOLN
750.0000 mg | INTRAVENOUS | Status: DC
  Administered 2015-10-15 – 2015-10-17 (×2): 750 mg via INTRAVENOUS
  Filled 2015-10-13 (×4): qty 150

## 2015-10-13 MED ORDER — LIDOCAINE HCL (CARDIAC) 20 MG/ML IV SOLN
INTRAVENOUS | Status: DC | PRN
  Administered 2015-10-13: 60 mg via INTRAVENOUS

## 2015-10-13 MED ORDER — MIDAZOLAM HCL 2 MG/2ML IJ SOLN
INTRAMUSCULAR | Status: AC
  Filled 2015-10-13: qty 2

## 2015-10-13 MED ORDER — CARBOXYMETHYLCELLULOSE SODIUM 1 % OP SOLN: 1.0000 [drp] | Freq: Every day | OPHTHALMIC | Status: DC

## 2015-10-13 MED ORDER — PIPERACILLIN-TAZOBACTAM 3.375 G IVPB: 3.3750 g | Freq: Three times a day (TID) | INTRAVENOUS | Status: DC

## 2015-10-13 MED ORDER — CLONIDINE HCL 0.2 MG PO TABS
0.2000 mg | ORAL_TABLET | Freq: Three times a day (TID) | ORAL | Status: DC
Start: 2015-10-13 — End: 2015-10-17
  Administered 2015-10-13 – 2015-10-16 (×11): 0.2 mg via ORAL
  Filled 2015-10-13 (×12): qty 1

## 2015-10-13 MED ORDER — PANTOPRAZOLE SODIUM 40 MG PO TBEC
40.0000 mg | DELAYED_RELEASE_TABLET | Freq: Every day | ORAL | Status: DC
  Administered 2015-10-14 – 2015-10-17 (×4): 40 mg via ORAL
  Filled 2015-10-13 (×4): qty 1

## 2015-10-13 MED ORDER — BUPROPION HCL ER (XL) 150 MG PO TB24
150.0000 mg | ORAL_TABLET | Freq: Every day | ORAL | Status: DC
  Administered 2015-10-14 – 2015-10-17 (×4): 150 mg via ORAL
  Filled 2015-10-13 (×4): qty 1

## 2015-10-13 MED ORDER — PROPOFOL 10 MG/ML IV BOLUS
INTRAVENOUS | Status: DC | PRN
  Administered 2015-10-13: 100 mg via INTRAVENOUS

## 2015-10-13 MED ORDER — HYDROMORPHONE HCL 1 MG/ML IJ SOLN
0.2500 mg | INTRAMUSCULAR | Status: DC | PRN
  Administered 2015-10-13 (×2): 0.5 mg via INTRAVENOUS

## 2015-10-13 MED ORDER — PROMETHAZINE HCL 25 MG/ML IJ SOLN: 6.2500 mg | INTRAMUSCULAR | Status: DC | PRN

## 2015-10-13 MED ORDER — LINAGLIPTIN 5 MG PO TABS
5.0000 mg | ORAL_TABLET | Freq: Every day | ORAL | Status: DC
  Administered 2015-10-14 – 2015-10-17 (×4): 5 mg via ORAL
  Filled 2015-10-13 (×4): qty 1

## 2015-10-13 MED ORDER — INSULIN DETEMIR 100 UNIT/ML ~~LOC~~ SOLN
25.0000 [IU] | Freq: Every day | SUBCUTANEOUS | Status: DC
  Administered 2015-10-13 – 2015-10-16 (×4): 25 [IU] via SUBCUTANEOUS
  Filled 2015-10-13 (×5): qty 0.25

## 2015-10-13 MED ORDER — FENTANYL CITRATE (PF) 250 MCG/5ML IJ SOLN
INTRAMUSCULAR | Status: AC
  Filled 2015-10-13: qty 5

## 2015-10-13 MED ORDER — SODIUM CHLORIDE 0.9 % IV SOLN: 250.0000 mL | INTRAVENOUS | Status: DC | PRN

## 2015-10-13 MED ORDER — ASPIRIN EC 81 MG PO TBEC: 162.0000 mg | DELAYED_RELEASE_TABLET | Freq: Every day | ORAL | Status: DC

## 2015-10-13 MED ORDER — INSULIN ASPART 100 UNIT/ML ~~LOC~~ SOLN
0.0000 [IU] | Freq: Three times a day (TID) | SUBCUTANEOUS | Status: DC
  Administered 2015-10-14: 1 [IU] via SUBCUTANEOUS
  Administered 2015-10-15: 3 [IU] via SUBCUTANEOUS
  Administered 2015-10-15: 2 [IU] via SUBCUTANEOUS
  Administered 2015-10-16 (×2): 3 [IU] via SUBCUTANEOUS

## 2015-10-13 MED ORDER — PROPOFOL 10 MG/ML IV BOLUS
INTRAVENOUS | Status: AC
  Filled 2015-10-13: qty 20

## 2015-10-13 MED ORDER — SODIUM CHLORIDE 0.9 % IJ SOLN: 3.0000 mL | INTRAMUSCULAR | Status: DC | PRN

## 2015-10-13 MED ORDER — HYDROMORPHONE HCL 1 MG/ML IJ SOLN
INTRAMUSCULAR | Status: AC
  Filled 2015-10-13: qty 1

## 2015-10-13 MED ORDER — 0.9 % SODIUM CHLORIDE (POUR BTL) OPTIME
TOPICAL | Status: DC | PRN
  Administered 2015-10-13: 1000 mL

## 2015-10-13 MED ORDER — FENTANYL CITRATE (PF) 100 MCG/2ML IJ SOLN
INTRAMUSCULAR | Status: DC | PRN
Start: 2015-10-13 — End: 2015-10-13
  Administered 2015-10-13: 50 ug via INTRAVENOUS

## 2015-10-13 MED ORDER — ASPIRIN EC 81 MG PO TBEC
162.0000 mg | DELAYED_RELEASE_TABLET | Freq: Every day | ORAL | Status: DC
  Administered 2015-10-14 – 2015-10-17 (×4): 162 mg via ORAL
  Filled 2015-10-13 (×4): qty 2

## 2015-10-13 SURGICAL SUPPLY — 39 items
BNDG GAUZE ELAST 4 BULKY (GAUZE/BANDAGES/DRESSINGS) ×4 IMPLANT
CANISTER SUCTION 2500CC (MISCELLANEOUS) ×3 IMPLANT
CLIP TI MEDIUM 6 (CLIP) ×3 IMPLANT
CLIP TI WIDE RED SMALL 6 (CLIP) ×3 IMPLANT
DECANTER SPIKE VIAL GLASS SM (MISCELLANEOUS) IMPLANT
ELECT REM PT RETURN 9FT ADLT (ELECTROSURGICAL) ×3
ELECTRODE REM PT RTRN 9FT ADLT (ELECTROSURGICAL) ×1 IMPLANT
GLOVE BIO SURGEON STRL SZ 6.5 (GLOVE) ×1 IMPLANT
GLOVE BIO SURGEON STRL SZ7 (GLOVE) ×3 IMPLANT
GLOVE BIO SURGEONS STRL SZ 6.5 (GLOVE) ×1
GLOVE BIOGEL PI IND STRL 6.5 (GLOVE) IMPLANT
GLOVE BIOGEL PI IND STRL 7.0 (GLOVE) IMPLANT
GLOVE BIOGEL PI IND STRL 7.5 (GLOVE) ×1 IMPLANT
GLOVE BIOGEL PI INDICATOR 6.5 (GLOVE) ×2
GLOVE BIOGEL PI INDICATOR 7.0 (GLOVE) ×2
GLOVE BIOGEL PI INDICATOR 7.5 (GLOVE) ×4
GLOVE SURG SS PI 7.5 STRL IVOR (GLOVE) ×2 IMPLANT
GOWN STRL REUS W/ TWL LRG LVL3 (GOWN DISPOSABLE) ×3 IMPLANT
GOWN STRL REUS W/TWL LRG LVL3 (GOWN DISPOSABLE) ×9
KIT BASIN OR (CUSTOM PROCEDURE TRAY) ×3 IMPLANT
KIT ROOM TURNOVER OR (KITS) ×3 IMPLANT
LIQUID BAND (GAUZE/BANDAGES/DRESSINGS) ×3 IMPLANT
NDL HYPO 25GX1X1/2 BEV (NEEDLE) ×1 IMPLANT
NEEDLE HYPO 25GX1X1/2 BEV (NEEDLE) ×3 IMPLANT
NS IRRIG 1000ML POUR BTL (IV SOLUTION) ×3 IMPLANT
PACK CV ACCESS (CUSTOM PROCEDURE TRAY) ×3 IMPLANT
PAD ARMBOARD 7.5X6 YLW CONV (MISCELLANEOUS) ×6 IMPLANT
PATCH VASC XENOSURE 1CMX6CM (Vascular Products) ×3 IMPLANT
PATCH VASC XENOSURE 1X6 (Vascular Products) IMPLANT
SPONGE GAUZE 4X4 12PLY STER LF (GAUZE/BANDAGES/DRESSINGS) ×4 IMPLANT
SPONGE SURGIFOAM ABS GEL 100 (HEMOSTASIS) IMPLANT
SUT MNCRL AB 4-0 PS2 18 (SUTURE) ×4 IMPLANT
SUT PROLENE 5 0 C 1 36 (SUTURE) ×2 IMPLANT
SUT PROLENE 6 0 BV (SUTURE) ×7 IMPLANT
SUT VIC AB 3-0 SH 27 (SUTURE) ×6
SUT VIC AB 3-0 SH 27X BRD (SUTURE) ×1 IMPLANT
TAPE CLOTH SURG 4X10 WHT LF (GAUZE/BANDAGES/DRESSINGS) ×4 IMPLANT
UNDERPAD 30X30 INCONTINENT (UNDERPADS AND DIAPERS) ×3 IMPLANT
WATER STERILE IRR 1000ML POUR (IV SOLUTION) ×3 IMPLANT

## 2015-10-13 NOTE — Consult Note (Addendum)
Renal Service Consult Note [facility]  MYLZ STIVERS 10/13/2015 Roney Jaffe D Requesting Physician:  Dr Bridgett Larsson  Reason for Consult:  ESRD pt with infected L arm AVG HPI: The patient is a 75 y.o. year-old with hx of DM, HTN, and ESRD on HD had a new LUA AV graft placed on Oct 04 2015.  We was at his last HD and noted drainage from the distal incision so was told to see the surgeons. He was seen by VVS who had concerns about viability of AVG given poor arterial inflow. Also noted fluid drainage. Decision made to remove the AVF today , patient had surg w/o complications so far.  Pleasant, no distress postop.    Denies any recent CP, hx CAD/ stents/ MI. +hx prost Ca 2006 rx with XRT and AD Rx in teh past.  On HD 3 yrs in [place] where he grew up.  Long hx DM and HTN.  R IJ cath in 3-4 wks per pt. R BKA 2004.    ROS  no CP, SOB no n/v/d, no abd pain  no joint pain  no rash  no confusion  no fevers  Past Medical History  Past Medical History  Diagnosis Date  . Hypertension   . Hyperlipidemia   . Cancer [facility])     Prostate cancer treated with radiation therapy  . Thyroid disease     hyperparathyroidism  . Anemia   . Positive PPD   . Clot 05/2006    Arterial clot right leg   . Hemodialysis patient ([place]) 6/13  . Diverticulosis   . Anxiety   . Chronic kidney disease     dialysis Mon, Wednes, Fri  . Shortness of breath dyspnea     with exertion, when he has too much fluid  . Diabetes mellitus     Type II  . Constipation   . GERD (gastroesophageal reflux disease)   . Infection     drainage from distal incision of left upper arm arteriovenous graft   Past Surgical History  Past Surgical History  Procedure Laterality Date  . 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 Av fistula placement  11/04/2010    left and left  . Av fistula placement  11/20/2011    Procedure: INSERTION OF ARTERIOVENOUS (AV) GORE-TEX GRAFT ARM;  Surgeon: Angelia Mould, MD;   [facility]: [facility] OR;  Service: Vascular;  Laterality: Right;  . Colonoscopy N/A 11/10/2012    [id number] proctitis/Pancolonic diverticulosis/(TUBULAR ADENOMA)5 mm polyp in the transverse segment/4 mm polyps in the base of the cecum and diminutive polyp in the descending segment. INADEQUATE BOWEL PREP  . Colonoscopy N/A 11/28/2013    RMR: Radiation proctitis. Colonic diverticulosis. Colonic polyps-removed as described above.   [name] N/A 10/08/2012    Procedure: [name];  Surgeon: Elam Dutch, MD;  Location: [facility] CATH LAB;  Service: Cardiovascular;  Laterality: N/A;  . Av fistula placement Right 04/19/2015    Procedure: Excision of infected RUA AVG;  Surgeon: Conrad [place], MD;  Location: [place];  Service: Vascular;  Laterality: Right;  . Bascilic vein transposition Left 06/19/2015    Procedure: FIRST STAGE BRACHIAL VEIN TRANSPOSITION;  Surgeon: Conrad [place]ee, MD;  Location: [place];  Service: Vascular;  Laterality: Left;  . Av fistula placement Left 10/04/2015    Procedure: INSERTION OF ARTERIOVENOUS (AV) GORE-TEX GRAFT ARM;  Surgeon: Conrad [place], MD;  Location: [place];  Service: Vascular;  Laterality: Left;  Family History  Family History  Problem Relation Age of Onset  . Heart disease Mother   . Diabetes Mother   . Hypertension Mother   . Stroke Mother   . Heart disease Father   . Diabetes Father   . Heart disease Sister   . Diabetes Brother   . Colon cancer Neg Hx    Social History  reports that he has been smoking Cigarettes.  He has a 12.5 pack-year smoking history. He has never used smokeless tobacco. He reports that he does not drink alcohol or use illicit drugs. Allergies No Known Allergies Home medications Prior to Admission medications   Medication Sig Start Date End Date Taking? Authorizing Provider  ACCU-CHEK AVIVA PLUS test strip  06/30/15  Yes Historical Provider, MD  aspirin EC 81 MG tablet Take 81-162 mg by mouth 2 (two) times daily. Take 2 tablets (162 mg) by  mouth every morning and 1 tablet (81 mg) at night   Yes Historical Provider, MD  buPROPion (WELLBUTRIN XL) 150 MG 24 hr tablet Take 150 mg by mouth daily. 03/05/15  Yes Historical Provider, MD  calcium acetate (PHOSLO) 667 MG capsule Take 667-1,334 mg by mouth See admin instructions. Take 2 capsules (1334 mg) by mouth with breakfast and with supper, take 1 capsule (667 mg) with snacks or small meals 10/26/12  Yes Historical Provider, MD  cloNIDine (CATAPRES) 0.2 MG tablet Take 0.2 mg by mouth 3 (three) times daily.    Yes Historical Provider, MD  docusate sodium (COLACE) 100 MG capsule Take 1 capsule (100 mg total) by mouth 2 (two) times daily. Patient taking differently: Take 100 mg by mouth See admin instructions. Take 1 capsule (100 mg) by mouth on Saturday, Tuesday, Thursday, Sunday (non-dialysis days) 05/10/15  Yes Carlis Stable, NP  gabapentin (NEURONTIN) 100 MG capsule Take 100 mg by mouth 2 (two) times daily. 04/13/15  Yes Historical Provider, MD  insulin detemir (LEVEMIR) 100 UNIT/ML injection Inject 25 Units into the skin at bedtime.    Yes Historical Provider, MD  Linaclotide Rolan Lipa) 145 MCG CAPS capsule Take 1 capsule (145 mcg total) by mouth daily. Take 30 minutes before breakfast Patient taking differently: Take 145 mcg by mouth See admin instructions. Take 30 minutes before breakfast on Sunday, Tuesday, Thursday, Saturday (non-dialysis days) 02/06/15  Yes Carlis Stable, NP  omeprazole (PRILOSEC) 20 MG capsule Take 20 mg by mouth 2 (two) times daily before a meal.  02/20/15  Yes Historical Provider, MD  oxyCODONE (ROXICODONE) 5 MG immediate release tablet Take 1 tablet (5 mg total) by mouth every 6 (six) hours as needed. Patient taking differently: Take 5 mg by mouth every 6 (six) hours as needed for severe pain.  10/04/15  Yes Ulyses Amor, PA-C  Polyvinyl Alcohol-Povidone (REFRESH OP) Place 1 drop into both eyes at bedtime.   Yes Historical Provider, MD  QUEtiapine (SEROQUEL) 100 MG tablet Take  100 mg by mouth at bedtime. 03/05/15  Yes Historical Provider, MD  saxagliptin HCl (ONGLYZA) 5 MG TABS tablet Take 5 mg by mouth daily with breakfast.    Yes Historical Provider, MD   Liver Function Tests No results for input(s): AST, ALT, ALKPHOS, BILITOT, PROT, ALBUMIN in the last 168 hours. No results for input(s): LIPASE, AMYLASE in the last 168 hours. CBC  Recent Labs Lab 10/13/15 0954  HGB 12.2*  HCT [id number]*   Basic Metabolic Panel  Recent Labs Lab 10/13/15 0954  NA 137  K 4.2  GLUCOSE 86  Filed Vitals:   10/13/15 1530 10/13/15 1541 10/13/15 1545 10/13/15 1616  BP:  155/68  171/80  Pulse: 71 72 70 70  Temp:  97.8 F (36.6 C)    TempSrc:      Resp: 12 13 13 15   SpO2: 100% 100% 100% 100%   Exam Alert no distress, calm No rash, cyanosis or gangrene Sclera anicteric, throat clear No jvd or bruits Chest clear bilat RRR soft SEM no rg R IJ tunneled HD cath no drainage Abd soft ^bs ntnd no hsm or ascites GU deferred MS no joint effusion Ext no LE or UE edema, R BKA No wounds or ulcers Neuro is alert, ox 3 nonfocal  Dialysis: MWF [facility]  4h  71.5kg  2/2.25 bath  P4  Hep 3000  R IJ cath (LUA AVG now removed) 12.2 / 44%/ - > venofer 50/wk 8.9 / 2.8 / 383 > calcitriol 0.25 ug tiw BP's normal, good compl   Assessment: 1 Removal infected AV graft, 1/14- recently placed 2 wks ago. IV abx on vanc/ zosyn 2 ESRD on HD MWF, using TDC 3 HTN clon. 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 Next HD Monday.   Kelly Splinter MD [employer] pager [phone]    cell [phone] 10/13/2015, 6:06 PM

## 2015-10-13 NOTE — Interval H&P Note (Signed)
History and Physical Interval Note:  10/13/2015 8:45 AM  Gerald Davenport  has presented today for surgery, with the diagnosis of Drainage from distal incision of left upper arm arteriovenous graft T82.898A  The various methods of treatment have been discussed with the patient and family. After consideration of risks, benefits and other options for treatment, the patient has consented to  Procedure(s): REMOVAL OF ARTERIOVENOUS GORETEX GRAFT ([place]) (Left) as a surgical intervention .  The patient's history has been reviewed, patient examined, no change in status, stable for surgery.  I have reviewed the patient's chart and labs.  Questions were answered to the patient's satisfaction.     Adele Barthel

## 2015-10-13 NOTE — Anesthesia Postprocedure Evaluation (Signed)
Anesthesia Post Note  Patient: Gerald Davenport  Procedure(s) Performed: Procedure(s) (LRB): REMOVAL OF INFECTED ARTERIOVENOUS GORETEX LEFT GRAFT  (Left)  Patient location during evaluation: PACU Anesthesia Type: General Level of consciousness: sedated Pain management: pain level controlled Vital Signs Assessment: post-procedure vital signs reviewed and stable Respiratory status: spontaneous breathing and respiratory function stable Cardiovascular status: stable Anesthetic complications: no    Last Vitals:  Filed Vitals:   10/13/15 1440 10/13/15 1455  BP: 163/69 156/76  Pulse: 74 74  Temp:    Resp: 13 9    Last Pain:  Filed Vitals:   10/13/15 1508  PainSc: 6                  Amel Kitch DANIEL

## 2015-10-13 NOTE — Progress Notes (Signed)
Called patient and requested that he arrive as soon as he is able.

## 2015-10-13 NOTE — Op Note (Signed)
OPERATIVE NOTE   PROCEDURE: 1. Excision of Left upper arm looped arteriovenous graft 2. Bovine patch angioplasty of high brachial artery  PRE-OPERATIVE DIAGNOSIS: infected left upper arm arteriovenous graft   POST-OPERATIVE DIAGNOSIS: same as above   SURGEON: Adele Barthel, MD  ASSISTANT(S): Gerri Lins, PAC   ANESTHESIA: general  ESTIMATED BLOOD LOSS: 50 cc  FINDING(S): 1.  Infected left upper arm arteriovenous graft with slime along mid-segment of graft 2.  Frank pus in antecubital incision 3.  No obvious evidence of infection of axillary incision  SPECIMEN(S):  Anaerobic and aerobic cultures of antecubital incision and graft  INDICATIONS:   Gerald Davenport is a 75 y.o. male who presents with possible infected left upper arm looped arteriovenous graft.  This arteriovenous graft was placed less than two weeks ago, but his physical exam findings on follow up were concerning for graft infection.  I elected to explore the left upper arm and removed the left upper arm arteriovenous graft if needed.  Risk, benefits, and alternatives to access surgery were discussed.  The patient is aware the risks include but are not limited to: bleeding, infection, steal syndrome, nerve damage, ischemic monomelic neuropathy, failure to mature, need for additional procedures, death and stroke.  The patient agrees to proceed forward with the procedure.   DESCRIPTION: After obtaining full informed written consent, the patient was brought back to the operating room and placed supine upon the operating table.  The patient received IV antibiotics prior to induction.  After obtaining adequate anesthesia, the patient was prepped and draped in the standard fashion for: left arm access.  I turned my attention to the antecubital incision.  The skin had already separated.  I removed the subcuticular stitch and then transected the subcutaneous stitch.  Immediately, I could see pus draining from this incision.  I  took anaerobic and aerobic specimens of the wound and pus.  There was some necrotic subcutaneous fat.   I clamped the graft in this wound and packed this wound with a Raytec   I turned my attention to the axillary incision where I transected the subcuticular and subcutaneous stitches.  I dissected down to the anastomoses.  There was no pus and the graft did not look infected in this incision.  I tied off the high brachial vein proximal to the graft with a 2-0 silk and then transected the venous anastomosis.  I oversewed the brachial vein with a double layer of 5-0 Prolene.  I pulled the venous arm of the graft through the antecubital incision.  The mid-segment of this graft appeared to be infected and slime covered.  I passed off a segment of this graft a specimen.  I then tried to identify the arterial anastomosis, but I could not easily identify the anastomosis.    I obtained a Pulsavac and washed out the axillary and antecubital incisions with 1 L of sterile normal saline.  This allowed me to identify the high brachial artery and dissect out the segment sewn to the graft.  I clamped the artery proximally and distally and then transected the suture line with a 11-blade.  I removed the arterial arm of this graft from the field.  I then fashioned a bovine patch for this arteriotomy.  I sewed it in place with a running stitch of 6-0 prolene.  Prior to completing this patch angioplasty, I backbled both ends of this artery.  There was no thrombus.  I washed out the arterial lumen with heparinized  saline.  I completed the patch angioplasty in the standard fashion.    I then fenestrated a Penrose drain and tracked it through the entire graft tract leaving the mid-portion of the drain in the antecubital wound.  I washed out all incisions.  I reapproximated the subcutaneous tissue in the axilla with a layer of 3-0 Vicryl in the subcutaneous tissue, taking care to avoid sewing the drain.  The skin was reapproximated  with staples.  The antecubital incision was packed with wet 4 x 4 gauzes.  A sterile dressing was applied to this incision and the entire arm was wrapped with a Kerlix.   COMPLICATIONS: none  CONDITION: stable   Adele Barthel, MD [facility] Office: [phone] Pager: [phone]  10/13/2015, 2:30 PM

## 2015-10-13 NOTE — Progress Notes (Signed)
ANTIBIOTIC CONSULT NOTE - INITIAL  Pharmacy Consult for Vancomycin Indication: wound infection  No Known Allergies  Patient Measurements:   Adjusted Body Weight:   Vital Signs: Temp: 97.8 F (36.6 C) (01/14 1541) Temp Source: Oral (01/14 0930) BP: 171/80 mmHg (01/14 1616) Pulse Rate: 70 (01/14 1616) Intake/Output from previous day:   Intake/Output from this shift: Total I/O In: 250 [I.V.:250] Out: -   Labs:  Recent Labs  10/13/15 0954  HGB 12.2*   CrCl cannot be calculated (Patient has no serum creatinine result on file.). No results for input(s): VANCOTROUGH, VANCOPEAK, VANCORANDOM, GENTTROUGH, GENTPEAK, GENTRANDOM, TOBRATROUGH, TOBRAPEAK, TOBRARND, AMIKACINPEAK, AMIKACINTROU, AMIKACIN in the last 72 hours.   Microbiology: No results found for this or any previous visit (from the past 720 hour(s)).  Medical History: Past Medical History  Diagnosis Date  . Hypertension   . Hyperlipidemia   . Cancer [facility])     Prostate cancer treated with radiation therapy  . Thyroid disease     hyperparathyroidism  . Anemia   . Positive PPD   . Clot 05/2006    Arterial clot right leg   . Hemodialysis patient ([place]) 6/13  . Diverticulosis   . Anxiety   . Chronic kidney disease     dialysis Mon, Wednes, Fri  . Shortness of breath dyspnea     with exertion, when he has too much fluid  . Diabetes mellitus     Type II  . Constipation   . GERD (gastroesophageal reflux disease)   . Infection     drainage from distal incision of left upper arm arteriovenous graft    Medications:  Prescriptions prior to admission  Medication Sig Dispense Refill Last Dose  . ACCU-CHEK AVIVA PLUS test strip    Taking  . aspirin EC 81 MG tablet Take 81-162 mg by mouth 2 (two) times daily. Take 2 tablets (162 mg) by mouth every morning and 1 tablet (81 mg) at night   10/13/2015 at 0700  . buPROPion (WELLBUTRIN XL) 150 MG 24 hr tablet Take 150 mg by mouth daily.   10/13/2015 at Unknown time  .  calcium acetate (PHOSLO) 667 MG capsule Take 667-1,334 mg by mouth See admin instructions. Take 2 capsules (1334 mg) by mouth with breakfast and with supper, take 1 capsule (667 mg) with snacks or small meals   10/12/2015 at Unknown time  . cloNIDine (CATAPRES) 0.2 MG tablet Take 0.2 mg by mouth 3 (three) times daily.    10/13/2015 at Unknown time  . docusate sodium (COLACE) 100 MG capsule Take 1 capsule (100 mg total) by mouth 2 (two) times daily. (Patient taking differently: Take 100 mg by mouth See admin instructions. Take 1 capsule (100 mg) by mouth on Saturday, Tuesday, Thursday, Sunday (non-dialysis days)) 10 capsule 0 10/11/2015  . gabapentin (NEURONTIN) 100 MG capsule Take 100 mg by mouth 2 (two) times daily.   10/12/2015 at Unknown time  . insulin detemir (LEVEMIR) 100 UNIT/ML injection Inject 25 Units into the skin at bedtime.    10/12/2015 at Unknown time  . Linaclotide (LINZESS) 145 MCG CAPS capsule Take 1 capsule (145 mcg total) by mouth daily. Take 30 minutes before breakfast (Patient taking differently: Take 145 mcg by mouth See admin instructions. Take 30 minutes before breakfast on Sunday, Tuesday, Thursday, Saturday (non-dialysis days)) 30 capsule 11 10/11/2015  . omeprazole (PRILOSEC) 20 MG capsule Take 20 mg by mouth 2 (two) times daily before a meal.    10/12/2015 at Unknown time  .  oxyCODONE (ROXICODONE) 5 MG immediate release tablet Take 1 tablet (5 mg total) by mouth every 6 (six) hours as needed. (Patient taking differently: Take 5 mg by mouth every 6 (six) hours as needed for severe pain. ) 20 tablet 0 Past Month at Unknown time  . Polyvinyl Alcohol-Povidone (REFRESH OP) Place 1 drop into both eyes at bedtime.   10/12/2015 at Unknown time  . QUEtiapine (SEROQUEL) 100 MG tablet Take 100 mg by mouth at bedtime.   10/12/2015 at Unknown time  . saxagliptin HCl (ONGLYZA) 5 MG TABS tablet Take 5 mg by mouth daily with breakfast.    10/12/2015 at Unknown time   Scheduled:  . aspirin EC  81-162  mg Oral BID  . buPROPion  150 mg Oral Daily  . carboxymethylcellulose  1 drop Both Eyes QHS  . cloNIDine  0.2 mg Oral TID  . gabapentin  100 mg Oral BID  . HYDROmorphone      . insulin aspart  0-9 Units Subcutaneous TID WC  . insulin detemir  25 Units Subcutaneous QHS  . Linaclotide  145 mcg Oral See admin instructions  . linagliptin  5 mg Oral Daily  . pantoprazole  40 mg Oral Daily  . piperacillin-tazobactam (ZOSYN)  IV  3.375 g Intravenous 3 times per day  . potassium chloride  20-40 mEq Oral Once  . QUEtiapine  100 mg Oral QHS  . 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 Pharmacy is consulted to dose vancomycin and zosyn for wound infection.  Goal of Therapy:  Vancomycin pre-HD target level 15-25  Plan:  Vancomycin 1500mg  IV once followed by 750mg  qHD Zosyn 2.25g IV q8h Measure antibiotic drug levels at steady state Follow up culture results, HD plans and clinical course  Andrey Cota. Diona Foley, PharmD, BCPS Clinical Pharmacist Pager [phone] 10/13/2015,4:34 PM

## 2015-10-13 NOTE — Anesthesia Preprocedure Evaluation (Addendum)
Anesthesia Evaluation  Patient identified by MRN, date of birth, ID band Patient awake    Reviewed: Allergy & Precautions, NPO status , Patient's Chart, lab work & pertinent test results  History of Anesthesia Complications Negative for: history of anesthetic complications  Airway Mallampati: I  TM Distance: >3 FB Neck ROM: Full    Dental  (+) Edentulous Upper, Edentulous Lower, Dental Advisory Given   Pulmonary shortness of breath, Current Smoker,    Pulmonary exam normal        Cardiovascular hypertension, Normal cardiovascular exam     Neuro/Psych PSYCHIATRIC DISORDERS Anxiety negative neurological ROS     GI/Hepatic Neg liver ROS, GERD  ,  Endo/Other  diabetes  Renal/GU ESRF and DialysisRenal disease     Musculoskeletal   Abdominal   Peds  Hematology   Anesthesia Other Findings   Reproductive/Obstetrics                            Anesthesia Physical Anesthesia Plan  ASA: III  Anesthesia Plan: General and MAC   Post-op Pain Management:    Induction: Intravenous  Airway Management Planned: LMA and Simple Face Mask  Additional Equipment:   Intra-op Plan:   Post-operative Plan: Extubation in OR  Informed Consent: I have reviewed the patients History and Physical, chart, labs and discussed the procedure including the risks, benefits and alternatives for the proposed anesthesia with the patient or authorized representative who has indicated his/her understanding and acceptance.   Dental advisory given  Plan Discussed with: CRNA, Anesthesiologist and Surgeon  Anesthesia Plan Comments:        Anesthesia Quick Evaluation

## 2015-10-13 NOTE — Anesthesia Procedure Notes (Signed)
Procedure Name: LMA Insertion Date/Time: 10/13/2015 1:09 PM Performed by: Melina Copa, Caralyn Twining R Pre-anesthesia Checklist: Patient identified, Emergency Drugs available, Suction available, Patient being monitored and Timeout performed Patient Re-evaluated:Patient Re-evaluated prior to inductionOxygen Delivery Method: Circle system utilized Preoxygenation: Pre-oxygenation with 100% oxygen Intubation Type: IV induction Ventilation: Mask ventilation without difficulty LMA: LMA inserted LMA Size: 4.0 Placement Confirmation: positive ETCO2 Tube secured with: Tape Dental Injury: Teeth and Oropharynx as per pre-operative assessment

## 2015-10-13 NOTE — H&P (View-Only) (Signed)
Postoperative Access Visit   History of Present Illness  Gerald Davenport is a 75 y.o. year old male who presents for postoperative follow-up for: LUA looped AVG (Date: 10/04/15).  The pt was sent from HD with drainage from his distal incision and concern with infection.  The patient notes increasing pain in his distal arm.  The patient notes no steal symptoms.  The patient is not able to complete their activities of daily living.  The patient's current symptoms are: drainage of clear fluid from distal incisiosn.  Past Medical History  Diagnosis Date  . Hypertension   . Hyperlipidemia   . Cancer [facility])     Prostate cancer treated with radiation therapy  . Thyroid disease     hyperparathyroidism  . Anemia   . Positive PPD   . Clot 05/2006    Arterial clot right leg   . Hemodialysis patient ([place]) 6/13  . Diverticulosis   . Anxiety   . Chronic kidney disease     dialysis Mon, Wednes, Fri  . Shortness of breath dyspnea     with exertion, when he has too much fluid  . Diabetes mellitus     Type II  . Constipation   . GERD (gastroesophageal reflux disease)     Past Surgical History  Procedure Laterality Date  . 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 Av fistula placement  11/04/2010    left and left  . Av fistula placement  11/20/2011    Procedure: INSERTION OF ARTERIOVENOUS (AV) GORE-TEX GRAFT ARM;  Surgeon: Angelia Mould, MD;  Location: [facility] OR;  Service: Vascular;  Laterality: Right;  . Colonoscopy N/A 11/10/2012    [id number] proctitis/Pancolonic diverticulosis/(TUBULAR ADENOMA)5 mm polyp in the transverse segment/4 mm polyps in the base of the cecum and diminutive polyp in the descending segment. INADEQUATE BOWEL PREP  . Colonoscopy N/A 11/28/2013    RMR: Radiation proctitis. Colonic diverticulosis. Colonic polyps-removed as described above.   [name] N/A 10/08/2012    Procedure: [name];  Surgeon: Elam Dutch, MD;  Location: [facility] CATH LAB;  Service:  Cardiovascular;  Laterality: N/A;  . Av fistula placement Right 04/19/2015    Procedure: Excision of infected RUA AVG;  Surgeon: Conrad Burton, MD;  Location: [place];  Service: Vascular;  Laterality: Right;  . Bascilic vein transposition Left 06/19/2015    Procedure: FIRST STAGE BRACHIAL VEIN TRANSPOSITION;  Surgeon: Conrad Ventura, MD;  Location: [place];  Service: Vascular;  Laterality: Left;  . Av fistula placement Left 10/04/2015    Procedure: INSERTION OF ARTERIOVENOUS (AV) GORE-TEX GRAFT ARM;  Surgeon: Conrad Emporia, MD;  Location: [place];  Service: Vascular;  Laterality: Left;    Social History   Social History  . Marital Status: Married    Spouse Name: N/A  . Number of Children: N/A  . Years of Education: N/A   Occupational History  . Not on file.   Social History Main Topics  . Smoking status: Current Every Day Smoker -- 0.25 packs/day for 50 years    Types: Cigarettes  . Smokeless tobacco: Never Used     Comment: 4-5 cigarettes daily  . Alcohol Use: No  . Drug Use: No  . Sexual Activity: Not on file   Other Topics Concern  . Not on file   Social History Narrative    Family History  Problem Relation Age of Onset  . Heart disease Mother   . Diabetes  Mother   . Hypertension Mother   . Stroke Mother   . Heart disease Father   . Diabetes Father   . Heart disease Sister   . Diabetes Brother   . Colon cancer Neg Hx     Current Outpatient Prescriptions  Medication Sig Dispense Refill  . ACCU-CHEK AVIVA PLUS test strip     . aspirin EC 81 MG tablet Take 81-162 mg by mouth 2 (two) times daily. Take 2 tablets (162 mg) by mouth every morning and 1 tablet (81 mg) at night    . buPROPion (WELLBUTRIN XL) 150 MG 24 hr tablet Take 150 mg by mouth daily.    . calcium acetate (PHOSLO) 667 MG capsule Take 667-1,334 mg by mouth See admin instructions. Take 2 capsules (1334 mg) by mouth with breakfast and with supper, take 1 capsule (667 mg) with snacks or small meals    . cloNIDine  (CATAPRES) 0.2 MG tablet Take 0.2 mg by mouth 3 (three) times daily.     Marland Kitchen docusate sodium (COLACE) 100 MG capsule Take 1 capsule (100 mg total) by mouth 2 (two) times daily. (Patient taking differently: Take 100 mg by mouth See admin instructions. Take 1 capsule (100 mg) by mouth on Saturday, Tuesday, Thursday, Sunday (non-dialysis days)) 10 capsule 0  . gabapentin (NEURONTIN) 100 MG capsule Take 100 mg by mouth 2 (two) times daily.    . insulin detemir (LEVEMIR) 100 UNIT/ML injection Inject 25 Units into the skin at bedtime.     . Linaclotide (LINZESS) 145 MCG CAPS capsule Take 1 capsule (145 mcg total) by mouth daily. Take 30 minutes before breakfast (Patient taking differently: Take 145 mcg by mouth See admin instructions. Take 30 minutes before breakfast on Sunday, Tuesday, Thursday, Saturday (non-dialysis days)) 30 capsule 11  . omeprazole (PRILOSEC) 20 MG capsule Take 20 mg by mouth 2 (two) times daily before a meal.     . oxyCODONE (ROXICODONE) 5 MG immediate release tablet Take 1 tablet (5 mg total) by mouth every 6 (six) hours as needed. 20 tablet 0  . Polyvinyl Alcohol-Povidone (REFRESH OP) Place 1 drop into both eyes at bedtime.    Marland Kitchen QUEtiapine (SEROQUEL) 100 MG tablet Take 100 mg by mouth at bedtime.    . saxagliptin HCl (ONGLYZA) 5 MG TABS tablet Take 5 mg by mouth daily with breakfast.     . calcium acetate (PHOSLO) 667 MG capsule     . LEVEMIR FLEXTOUCH 100 UNIT/ML Pen      No current facility-administered medications for this visit.     No Known Allergies   REVIEW OF SYSTEMS:  (Positives checked otherwise negative)  CARDIOVASCULAR:   [ ]  chest pain,  [ ]  chest pressure,  [ ]  palpitations,  [ ]  shortness of breath when laying flat,  [ ]  shortness of breath with exertion,   [ ]  pain in feet when walking,  [ ]  pain in feet when laying flat, [ ]  history of blood clot in veins (DVT),  [ ]  history of phlebitis,  [ ]  swelling in legs,  [ ]  varicose veins  PULMONARY:   [ ]   productive cough,  [ ]  asthma,  [ ]  wheezing  NEUROLOGIC:   [ ]  weakness in arms or legs,  [ ]  numbness in arms or legs,  [ ]  difficulty speaking or slurred speech,  [ ]  temporary loss of vision in one eye,  [ ]  dizziness  HEMATOLOGIC:   [ ]  bleeding problems,  [ ]   problems with blood clotting too easily  MUSCULOSKEL:   [ ]  joint pain, [ ]  joint swelling  GASTROINTEST:   [ ]  vomiting blood,  [ ]  blood in stool     GENITOURINARY:   [ ]  burning with urination,  [ ]  blood in urine [x]  ESRD-HD: M-W-F  PSYCHIATRIC:   [ ]  history of major depression  INTEGUMENTARY:   [ ]  rashes,  [ ]  ulcers  CONSTITUTIONAL:   [ ]  fever,  [ ]  chills    For VQI Use Only  PRE-ADM LIVING: Home  AMB STATUS: Ambulatory  Physical Examination Filed Vitals:   10/12/15 1127  BP: 134/66  Pulse: 90  Temp: 97 F (36.1 C)   Pulmonary: Sym exp, good air movt, CTAB, no rales, rhonchi, & wheezing  Cardiac: RRR, Nl S1, S2, no Murmurs, rubs or gallops  LUE: mild separation of axillary incision, moderate separation of antecubital incision, erythematous distal arm, warm hand, intact hang grip with sensation, severe TTP to light touch overlying route of graft, weak bruit, no thrill  Medical Decision Making  DELVIN MARPLE is a 75 y.o. year old male who presents s/p LUA looped AVG complicated with possible imminent thrombosis and infection.   I have had concerns with the quality of his arterial flow in his left arm from his prior procedures.  Despite arterial doppler with triphasic flow, this patient's inflow appears to be inadequate to support an AVG   As the graft isn't likely useful at this point, would go ahead and remove it tomorrow, in case its infected.  Thank you for allowing [place] to participate in this patient's care.  Adele Barthel, MD [facility] [place] Office: [phone] Pager: [phone]  10/12/2015, 11:43 AM

## 2015-10-13 NOTE — Transfer of Care (Signed)
Immediate Anesthesia Transfer of Care Note  Patient: Gerald Davenport  Procedure(s) Performed: Procedure(s): REMOVAL OF INFECTED ARTERIOVENOUS GORETEX LEFT GRAFT  (Left)  Patient Location: PACU  Anesthesia Type:General  Level of Consciousness: awake and patient cooperative  Airway & Oxygen Therapy: Patient Spontanous Breathing and Patient connected to nasal cannula oxygen  Post-op Assessment: Report given to RN, Post -op Vital signs reviewed and stable and Patient moving all extremities  Post vital signs: Reviewed and stable  Last Vitals:  Filed Vitals:   10/13/15 0930  BP: 148/49  Pulse: 74  Temp: 36.3 C  Resp: 18    Complications: No apparent anesthesia complications

## 2015-10-14 LAB — GLUCOSE, CAPILLARY
GLUCOSE-CAPILLARY: 117 mg/dL — AB (ref 65–99)
GLUCOSE-CAPILLARY: 144 mg/dL — AB (ref 65–99)
Glucose-Capillary: 65 mg/dL (ref 65–99)
Glucose-Capillary: 121 mg/dL — ABNORMAL HIGH (ref 65–99)

## 2015-10-14 MED ORDER — OXYCODONE HCL 5 MG PO TABS
5.0000 mg | ORAL_TABLET | ORAL | Status: DC | PRN
  Administered 2015-10-14: 10 mg via ORAL
  Filled 2015-10-14: qty 2

## 2015-10-14 NOTE — Progress Notes (Signed)
   Daily Progress Note  Assessment/Planning: POD #1 s/p LUA AVG excision, BPA L high brachial artery   Penrose pulled back, out tomorrow: minimal residual infection  Start VAC dressing tomorrow  Start wet-to-dry dressing BID today on L antecubital incision  Continue Zosyn/Vanc per Pharmacy until C&S comes back   Subjective  - 1 Day Post-Op  L arm pain better  Objective Filed Vitals:   10/13/15 1545 10/13/15 1616 10/13/15 2046 10/14/15 0425  BP:  171/80 146/58 131/52  Pulse: 70 70 80 86  Temp:   98.2 F (36.8 C) 98.9 F (37.2 C)  TempSrc:   Oral Oral  Resp: 13 15 18 18   SpO2: 100% 100% 100% 92%    Intake/Output Summary (Last 24 hours) at 10/14/15 0807 Last data filed at 10/13/15 1414  Gross per 24 hour  Intake    250 ml  Output      0 ml  Net    250 ml    PULM  CTAB CV  RRR GI  soft, NTND VASC  Limited amt of purulence in antecubital incision, Penrose in place, axillary exposure inc c/d/i,   Laboratory CBC    Component Value Date/Time   WBC 11.4* 10/13/2015 1827   HGB 11.8* 10/13/2015 1827   HCT 36.1* 10/13/2015 1827   PLT 211 10/13/2015 1827    BMET    Component Value Date/Time   NA 135 10/13/2015 1827   K 4.8 10/13/2015 1827   CL 95* 10/13/2015 1827   CO2 24 10/13/2015 1827   GLUCOSE 169* 10/13/2015 1827   BUN 48* 10/13/2015 1827   CREATININE 12.41* 10/13/2015 1827   CREATININE 9.19* 02/06/2015 1206   CALCIUM 7.6* 10/13/2015 1827   GFRNONAA 3* 10/13/2015 1827   GFRAA 4* 10/13/2015 1827    Adele Barthel, MD [facility] [place]: [phone] Pager: [phone]  10/14/2015, 8:07 AM

## 2015-10-14 NOTE — Progress Notes (Signed)
  [facility] Progress Note   Subjective: feeling ok,no c/o's  Filed Vitals:   10/13/15 1616 10/13/15 2046 10/14/15 0425 10/14/15 1523  BP: 171/80 146/58 131/52 133/55  Pulse: 70 80 86 80  Temp:  98.2 F (36.8 C) 98.9 F (37.2 C) 98.1 F (36.7 C)  TempSrc:  Oral Oral Oral  Resp: 15 18 18 20   SpO2: 100% 100% 92% 100%    Inpatient medications: . aspirin EC  162 mg Oral Daily  . buPROPion  150 mg Oral Daily  . [START ON 10/15/2015] calcitRIOL  0.25 mcg Oral Q M,W,F-HD  . cloNIDine  0.2 mg Oral TID  . [START ON 10/17/2015] ferric gluconate (FERRLECIT/NULECIT) IV  62.5 mg Intravenous Q Wed-HD  . gabapentin  100 mg Oral BID  . insulin aspart  0-9 Units Subcutaneous TID WC  . insulin detemir  25 Units Subcutaneous QHS  . Linaclotide  145 mcg Oral Once per day on Sun Tue Thu Sat  . linagliptin  5 mg Oral Daily  . pantoprazole  40 mg Oral Daily  . piperacillin-tazobactam (ZOSYN)  IV  2.25 g Intravenous Q8H  . polyvinyl alcohol  1 drop Both Eyes QHS  . potassium chloride  20-40 mEq Oral Once  . QUEtiapine  100 mg Oral QHS  . sodium chloride  3 mL Intravenous Q12H  . 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 IV abx on vanc/ zosyn. Wound cx's pend 2 ESRD on HD MWF, using TDC 3 HTN clon. 0.2 tid 4 DM on insulin 5 Depression  on medication 6 MBD get records 7 Anemia Hb 12, not on esa  Plan - HD Monday using HD cath. No blood cx's done on admission, will order today to ensure no bacteremia   Kelly Splinter MD [facility] pager [phone]    cell [phone] 10/14/2015, 3:50 PM    Recent Labs Lab 10/13/15 0954 10/13/15 1827  NA 137 135  K 4.2 4.8  CL  --  95*  CO2  --  24  GLUCOSE 86 169*  BUN  --  48*  CREATININE  --  12.41*  CALCIUM  --  7.6*    Recent Labs Lab 10/13/15 1827  AST 17  ALT 11*  ALKPHOS 72  BILITOT 0.5  PROT 6.7  ALBUMIN 3.0*    Recent Labs Lab 10/13/15 0954 10/13/15 1827  WBC  --  11.4*  HGB 12.2* 11.8*  HCT 36.0* 36.1*  MCV  --  110.4*  PLT  --  211

## 2015-10-14 NOTE — Plan of Care (Signed)
Problem: Pain Managment: Goal: General experience of comfort will improve Outcome: Completed/Met Date Met:  10/14/15 Pain controlled on oral pain medication

## 2015-10-15 ENCOUNTER — Encounter (HOSPITAL_COMMUNITY): Payer: Self-pay | Admitting: Vascular Surgery

## 2015-10-15 LAB — HEMOGLOBIN A1C
HEMOGLOBIN A1C: 6.7 % — AB (ref 4.8–5.6)
MEAN PLASMA GLUCOSE: 146 mg/dL

## 2015-10-15 LAB — RENAL FUNCTION PANEL
ANION GAP: 18 — AB (ref 5–15)
Albumin: 2.4 g/dL — ABNORMAL LOW (ref 3.5–5.0)
BUN: 61 mg/dL — ABNORMAL HIGH (ref 6–20)
CO2: 22 mmol/L (ref 22–32)
Calcium: 7 mg/dL — ABNORMAL LOW (ref 8.9–10.3)
Chloride: 97 mmol/L — ABNORMAL LOW (ref 101–111)
Creatinine, Ser: 14.7 mg/dL — ABNORMAL HIGH (ref 0.61–1.24)
GFR calc Af Amer: 3 mL/min — ABNORMAL LOW (ref >60)
GFR calc non Af Amer: 3 mL/min — ABNORMAL LOW (ref >60)
GLUCOSE: 67 mg/dL (ref 65–99)
POTASSIUM: 4.6 mmol/L (ref 3.5–5.1)
Phosphorus: 6.5 mg/dL — ABNORMAL HIGH (ref 2.5–4.6)
SODIUM: 137 mmol/L (ref 135–145)

## 2015-10-15 LAB — GLUCOSE, CAPILLARY
GLUCOSE-CAPILLARY: 146 mg/dL — AB (ref 65–99)
Glucose-Capillary: 91 mg/dL (ref 65–99)
Glucose-Capillary: 161 mg/dL — ABNORMAL HIGH (ref 65–99)
Glucose-Capillary: 224 mg/dL — ABNORMAL HIGH (ref 65–99)

## 2015-10-15 LAB — CBC
HEMATOCRIT: 29.6 % — AB (ref 39.0–52.0)
HEMOGLOBIN: 9.6 g/dL — AB (ref 13.0–17.0)
MCH: 34.8 pg — AB (ref 26.0–34.0)
MCHC: 32.4 g/dL (ref 30.0–36.0)
MCV: 107.2 fL — AB (ref 78.0–100.0)
Platelets: 246 10*3/uL (ref 150–400)
RBC: 2.76 MIL/uL — ABNORMAL LOW (ref 4.22–5.81)
RDW: 18.2 % — ABNORMAL HIGH (ref 11.5–15.5)
WBC: 8.8 10*3/uL (ref 4.0–10.5)

## 2015-10-15 MED ORDER — PENTAFLUOROPROP-TETRAFLUOROETH EX AERO: 1.0000 "application " | INHALATION_SPRAY | CUTANEOUS | Status: DC | PRN

## 2015-10-15 MED ORDER — SODIUM CHLORIDE 0.9 % IV SOLN: 100.0000 mL | INTRAVENOUS | Status: DC | PRN

## 2015-10-15 MED ORDER — HEPARIN SODIUM (PORCINE) 1000 UNIT/ML DIALYSIS: 1000.0000 [IU] | INTRAMUSCULAR | Status: DC | PRN

## 2015-10-15 MED ORDER — LIDOCAINE HCL (PF) 1 % IJ SOLN: 5.0000 mL | INTRAMUSCULAR | Status: DC | PRN

## 2015-10-15 MED ORDER — ALTEPLASE 2 MG IJ SOLR
2.0000 mg | Freq: Once | INTRAMUSCULAR | Status: DC | PRN
Start: 2015-10-15 — End: 2015-10-15
  Filled 2015-10-15: qty 2

## 2015-10-15 MED ORDER — LIDOCAINE-PRILOCAINE 2.5-2.5 % EX CREA: 1.0000 "application " | TOPICAL_CREAM | CUTANEOUS | Status: DC | PRN

## 2015-10-15 MED ORDER — CALCIUM ACETATE (PHOS BINDER) 667 MG PO CAPS
1334.0000 mg | ORAL_CAPSULE | Freq: Three times a day (TID) | ORAL | Status: DC
  Administered 2015-10-15 – 2015-10-17 (×6): 1334 mg via ORAL
  Filled 2015-10-15 (×8): qty 2

## 2015-10-15 MED ORDER — HEPARIN SODIUM (PORCINE) 1000 UNIT/ML DIALYSIS: 3000.0000 [IU] | Freq: Once | INTRAMUSCULAR | Status: DC

## 2015-10-15 NOTE — Care Management Note (Addendum)
Case Management Note  Patient Details  Name: Gerald Davenport MRN: [id number] Date of Birth: 25-May-1941  Subjective/Objective:   Pt admitted with upper arm graft infection                 Action/Plan:  Pt is from home with wife.  Pt will discharge home with wound vac.  Surgery and pt are agreeable with using [facility] for wound vac.  CM will continue to monitor for discharge needs   Expected Discharge Date:                  Expected Discharge Plan:  [place]  In-House Referral:     Discharge planning Services  CM Consult  Post Acute Care Choice:    Choice offered to:  Patient  DME Arranged:  Negative pressure wound device DME Agency:  [place]:  RN [facility] Agency:  [place]  Status of Service:  In process, will continue to follow  Medicare Important Message Given:    Date Medicare IM Given:    Medicare IM give by:    Date Additional Medicare IM Given:    Additional Medicare Important Message give by:     If discussed at [place] of Stay Meetings, dates discussed:    Additional Comments: Pt offered choice, chose [facility] for both HH and DME, agency contacted and referral has been accepted.  CM requested HHRN order to manage wound vac.  [facility] order form for wound vac has been placed on shadow chart Maryclare Labrador, RN 10/15/2015, 1:45 PM

## 2015-10-15 NOTE — Progress Notes (Addendum)
  [facility] Progress Note  Subjective  - POD #2  No complaints. Eating lunch.   Objective Filed Vitals:   10/15/15 1100 10/15/15 1116  BP: 143/68 161/81  Pulse: 83 75  Temp:  96 F (35.6 C)  Resp: 16 16    Intake/Output Summary (Last 24 hours) at 10/15/15 1206 Last data filed at 10/15/15 1100  Gross per 24 hour  Intake    460 ml  Output   3000 ml  Net  -2540 ml   Left antecubital incision penrose drain removed. Minimal residual purulence on drain. Otherwise wound is clean with pink granulation tissue. Axillary incision c/d/i  Assessment/Planning: 75 y.o. male is s/p: LUA AVG excision, BPA L high brachial artery 2 Days Post-Op   For VAC application today.  Wound culture pending. Continue antibiotics.   Alvia Grove 10/15/2015 12:06 PM --  Laboratory CBC    Component Value Date/Time   WBC 8.8 10/15/2015 0749   HGB 9.6* 10/15/2015 0749   HCT 29.6* 10/15/2015 0749   PLT 246 10/15/2015 0749    BMET    Component Value Date/Time   NA 137 10/15/2015 0748   K 4.6 10/15/2015 0748   CL 97* 10/15/2015 0748   CO2 22 10/15/2015 0748   GLUCOSE 67 10/15/2015 0748   BUN 61* 10/15/2015 0748   CREATININE 14.70* 10/15/2015 0748   CREATININE 9.19* 02/06/2015 1206   CALCIUM 7.0* 10/15/2015 0748   GFRNONAA 3* 10/15/2015 0748   GFRAA 3* 10/15/2015 0748    COAG Lab Results  Component Value Date   INR 1.03 10/13/2015   No results found for: PTT  Antibiotics Anti-infectives    Start     Dose/Rate Route Frequency Ordered Stop   10/15/15 1200  vancomycin (VANCOCIN) IVPB 750 mg/150 ml premix     750 mg 150 mL/hr over 60 Minutes Intravenous Every M-W-F (Hemodialysis) 10/13/15 1638     10/13/15 1700  piperacillin-tazobactam (ZOSYN) IVPB 2.25 g     2.25 g 100 mL/hr over 30 Minutes Intravenous Every 8 hours 10/13/15 1636     10/13/15 1645  vancomycin (VANCOCIN) 1,500 mg in sodium chloride 0.9 % 500 mL IVPB     1,500 mg 250 mL/hr over 120 Minutes  Intravenous  Once 10/13/15 1636 10/13/15 2142   10/13/15 1630  piperacillin-tazobactam (ZOSYN) IVPB 3.375 g  Status:  Discontinued     3.375 g 12.5 mL/hr over 240 Minutes Intravenous 3 times per day 10/13/15 1627 10/13/15 1636   10/13/15 1430  cefUROXime (ZINACEF) 1.5 g in dextrose 5 % 50 mL IVPB     1.5 g 100 mL/hr over 30 Minutes Intravenous To ShortStay Surgical 10/12/15 1422 10/13/15 [place], PA-C [facility] Office: [phone] Pager: [phone] 10/15/2015 12:06 PM     Addendum  I have independently interviewed and examined the patient, and I agree with the physician assistant's findings.  Penrose removed.  Minimal purulence in penrose.  Wound clean  Adele Barthel, MD [facility] [place] Office: [phone] Pager: [phone]  10/15/15 1500

## 2015-10-15 NOTE — Progress Notes (Signed)
[facility] Progress Note   Subjective: feeling ok,no c/o's- seen on HD   Filed Vitals:   10/15/15 0740 10/15/15 0800 10/15/15 0830 10/15/15 0927  BP: 160/84 148/82 161/80 167/76  Pulse: 70 68 70 73  Temp:      TempSrc:      Resp: 12 12 12 15   Weight:      SpO2:        Inpatient medications: . aspirin EC  162 mg Oral Daily  . buPROPion  150 mg Oral Daily  . calcitRIOL  0.25 mcg Oral Q M,W,F-HD  . cloNIDine  0.2 mg Oral TID  . [START ON 10/17/2015] ferric gluconate (FERRLECIT/NULECIT) IV  62.5 mg Intravenous Q Wed-HD  . gabapentin  100 mg Oral BID  . [START ON 10/16/2015] heparin  3,000 Units Dialysis Once in dialysis  . insulin aspart  0-9 Units Subcutaneous TID WC  . insulin detemir  25 Units Subcutaneous QHS  . Linaclotide  145 mcg Oral Once per day on Sun Tue Thu Sat  . linagliptin  5 mg Oral Daily  . pantoprazole  40 mg Oral Daily  . piperacillin-tazobactam (ZOSYN)  IV  2.25 g Intravenous Q8H  . polyvinyl alcohol  1 drop Both Eyes QHS  . potassium chloride  20-40 mEq Oral Once  . QUEtiapine  100 mg Oral QHS  . sodium chloride  3 mL Intravenous Q12H  . 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 IV abx on vanc/ zosyn. Wound cx's pend- negative to date 2 ESRD on HD MWF, using TDC 3 HTN clon. 0.2 tid- BP overall high 4 DM on insulin 5 Depression on medication 6 MBD get records- phos 6.5- calc low- on calcitriol- also says is on phoslo- will order  7 Anemia Hb 12, not on esa 8. Dispo- per VVS      Gerald Davenport   10/15/2015, 9:42 AM    Recent Labs Lab 10/13/15 0954 10/13/15 1827 10/15/15 0748  NA 137 135 137  K 4.2 4.8 4.6  CL  --  95* 97*  CO2  --  24 22  GLUCOSE 86 169* 67  BUN  --  48* 61*  CREATININE  --  12.41* 14.70*  CALCIUM  --  7.6* 7.0*  PHOS  --   --  6.5*    Recent Labs Lab 10/13/15 1827 10/15/15 0748  AST 17  --   ALT 11*  --   ALKPHOS 72  --   BILITOT 0.5  --   PROT 6.7  --   ALBUMIN 3.0* 2.4*    Recent Labs Lab 10/13/15 0954 10/13/15 1827 10/15/15 0749  WBC  --  11.4* 8.8  HGB 12.2* 11.8* 9.6*  HCT  36.0* 36.1* 29.6*  MCV  --  110.4* 107.2*  PLT  --  211 246

## 2015-10-15 NOTE — Procedures (Signed)
Patient was seen on dialysis and the procedure was supervised.  BFR 350  Via PC BP is  172/86.   Patient appears to be tolerating treatment well  Jevon Shells A 10/15/2015

## 2015-10-15 NOTE — Consult Note (Addendum)
WOC wound consult note Reason for Consult: Consult requested to apply Vac to left arm wound.  Pt is followed by VVS team for assessment and plan of care. Wound type: Full thickness post-op wound Measurement: 4X1X1cm Wound bed: 100% beefy red Drainage (amount, consistency, odor) Scant amt yellow drainage, no odor  Periwound: Intact skin surrounding Dressing procedure/placement/frequency: Applied one piece black foam to 1106mm cont scution.  Pt tolerated with minimal amt discomfort and denies need for pain meds. Plan for bedside nurses to change Q M/W/F Please re-consult if further assistance is needed.  Thank-you,  Julien Girt MSN, [place], [place], [place], [place]

## 2015-10-16 ENCOUNTER — Ambulatory Visit: Payer: Medicare PPO | Admitting: Podiatry

## 2015-10-16 LAB — GLUCOSE, CAPILLARY
GLUCOSE-CAPILLARY: 234 mg/dL — AB (ref 65–99)
GLUCOSE-CAPILLARY: 37 mg/dL — AB (ref 65–99)
GLUCOSE-CAPILLARY: 204 mg/dL — AB (ref 65–99)
GLUCOSE-CAPILLARY: 155 mg/dL — AB (ref 65–99)
Glucose-Capillary: 102 mg/dL — ABNORMAL HIGH (ref 65–99)

## 2015-10-16 LAB — WOUND CULTURE
CULTURE: NO GROWTH
Culture: NO GROWTH

## 2015-10-16 LAB — HEPATITIS B SURFACE ANTIGEN: Hepatitis B Surface Ag: NEGATIVE

## 2015-10-16 NOTE — Progress Notes (Signed)
Pharmacy Antibiotic Follow-up Note  Gerald Davenport is a 75 y.o. year-old male admitted on 10/13/2015.  The patient is currently on day 4 of vanc/zosyn for sepsis from infected HD graft .  Assessment/Plan:  s/p removal of infected AV graft on 1/14.  Wound Cx no growth final, BC no growth to date. WBC= 11.4>8.8, afeb,  wound VAC 1/16  Plan: continues on vanc 750 qMWF w/ HD and zosyn 2.25 gm q8h,could transition to ceftaz 2 gm MWF w/ HD if desired to continue abxs after DC/  Temp (24hrs), Avg:97.5 F (36.4 C), Min:96 F (35.6 C), Max:98.4 F (36.9 C)   Recent Labs Lab 10/13/15 1827 10/15/15 0749  WBC 11.4* 8.8    Recent Labs Lab 10/13/15 1827 10/15/15 0748  CREATININE 12.41* 14.70*   Estimated Creatinine Clearance: 4.3 mL/min (by C-G formula based on Cr of 14.7).    No Known Allergies  Antimicrobials this admission: 1/14 vanc>> 1/14 zosyn>>  Levels/dose changes this admission: none  Microbiology results: 1/14 wound cx>> ng2d F 1/15 BCx2>>ngtd  Thank you for allowing pharmacy to be a part of this patient's care. Eudelia Bunch, Pharm.D. [phone] 10/16/2015 10:00 AM

## 2015-10-16 NOTE — Care Management Important Message (Signed)
Important Message  Patient Details  Name: Gerald Davenport MRN: [id number] Date of Birth: Jan 14, 1941   Medicare Important Message Given:  Yes    [name] May 10/16/2015, 11:51 AM

## 2015-10-16 NOTE — Care Management Note (Signed)
Case Management Note  Patient Details  Name: [name] MRN: [id number] Date of Birth: 02-05-1941  Subjective/Objective:   Pt admitted with upper arm graft infection                 Action/Plan:  Pt is from home with wife.  Pt will discharge home with wound vac.  Surgery and pt are agreeable with using [facility] for wound vac.  CM will continue to monitor for discharge needs   Expected Discharge Date:                  Expected Discharge Plan:  [place]  In-House Referral:     Discharge planning Services  CM Consult  Post Acute Care Choice:    Choice offered to:  Patient  DME Arranged:  Negative pressure wound device DME Agency:  [place]:  RN [facility] Agency:  [place]  Status of Service:  In process, will continue to follow  Medicare Important Message Given:  Yes Date Medicare IM Given:    Medicare IM give by:    Date Additional Medicare IM Given:    Additional Medicare Important Message give by:     If discussed at [place] of Stay Meetings, dates discussed:    Additional Comments: 10/16/2015 CM spoke with PA, PA will complete [facility] wound care form.  Potential discharge for tomorrow.  10/15/15 Pt offered choice, chose [facility] for both HH and DME, agency contacted and referral has been accepted.  CM requested HHRN order to manage wound vac.  [facility] order form for wound vac has been placed on shadow chart Maryclare Labrador, RN 10/16/2015, 12:25 PM

## 2015-10-16 NOTE — Progress Notes (Signed)
Assessment: 1 Removal ?  infected AV graft, 1/14- recently placed 2 wks ago. IV abx on vanc/ zosyn. Wound cx's- negative to date 2 ESRD on HD MWF, using TDC 3 HTN clon. 0.2 tid- BP overall high 4 DM on insulin 5 Depression on medication 6 MBD get records- phos 6.5- calc low- on calcitriol- and phoslo 7 Anemia Hb 9.6 post op 8. Dispo- ? Dc after HD in AM Wed.  Subjective: Interval History: OK  Objective: Vital signs in last 24 hours: Temp:  [96 F (35.6 C)-98.4 F (36.9 C)] 98.4 F (36.9 C) (01/17 0416) Pulse Rate:  [71-83] 75 (01/17 0416) Resp:  [12-19] 18 (01/17 0416) BP: (131-162)/(61-81) 162/61 mmHg (01/17 0416) SpO2:  [96 %-100 %] 100 % (01/17 0416) Weight:  [70.6 kg (155 lb 10.3 oz)] 70.6 kg (155 lb 10.3 oz) (01/16 1100) Weight change:   Intake/Output from previous day: 01/16 0701 - 01/17 0700 In: 480 [P.O.:480] Out: 3000  Intake/Output this shift:    General appearance: alert and cooperative Resp: clear to auscultation bilaterally Chest wall: no tenderness, TDC right chest Cardio: regular rate and rhythm, S1, S2 normal, no murmur, click, rub or gallop Extremities: left axilla staples, VAC lue  Lab Results:  Recent Labs  10/13/15 1827 10/15/15 0749  WBC 11.4* 8.8  HGB 11.8* 9.6*  HCT 36.1* 29.6*  PLT 211 246   BMET:  Recent Labs  10/13/15 1827 10/15/15 0748  NA 135 137  K 4.8 4.6  CL 95* 97*  CO2 24 22  GLUCOSE 169* 67  BUN 48* 61*  CREATININE 12.41* 14.70*  CALCIUM 7.6* 7.0*   No results for input(s): PTH in the last 72 hours. Iron Studies: No results for input(s): IRON, TIBC, TRANSFERRIN, FERRITIN in the last 72 hours. Studies/Results: No results found.  Scheduled: . aspirin EC  162 mg Oral Daily  . buPROPion  150 mg Oral Daily  . calcitRIOL  0.25 mcg Oral Q M,W,F-HD  . calcium acetate  1,334 mg Oral TID WC  . cloNIDine  0.2 mg Oral TID  . [START ON 10/17/2015] ferric gluconate (FERRLECIT/NULECIT) IV  62.5 mg Intravenous Q Wed-HD  .  gabapentin  100 mg Oral BID  . insulin aspart  0-9 Units Subcutaneous TID WC  . insulin detemir  25 Units Subcutaneous QHS  . Linaclotide  145 mcg Oral Once per day on Sun Tue Thu Sat  . linagliptin  5 mg Oral Daily  . pantoprazole  40 mg Oral Daily  . piperacillin-tazobactam (ZOSYN)  IV  2.25 g Intravenous Q8H  . polyvinyl alcohol  1 drop Both Eyes QHS  . potassium chloride  20-40 mEq Oral Once  . QUEtiapine  100 mg Oral QHS  . sodium chloride  3 mL Intravenous Q12H  . vancomycin  750 mg Intravenous Q M,W,F-HD      LOS: 3 days   Zayyan Mullen C 10/16/2015,9:45 AM

## 2015-10-16 NOTE — Progress Notes (Signed)
UR Completed. Kameshia Madruga, RN, BSN.  [phone] 

## 2015-10-16 NOTE — Progress Notes (Signed)
   Daily Progress Note  Assessment/Planning: POD #3 s/p L UA looped AVG removal   VAC placed  Nothing growing from wound cx yet  D/C tomorrow with home VAC if wound clean tomorrow   Subjective  - 3 Days Post-Op  No complaints  Objective Filed Vitals:   10/15/15 1116 10/15/15 1447 10/15/15 2103 10/16/15 0416  BP: 161/81 134/66 131/71 162/61  Pulse: 75 77 75 75  Temp: 96 F (35.6 C) 97.4 F (36.3 C) 98.2 F (36.8 C) 98.4 F (36.9 C)  TempSrc: Oral Oral Oral Oral  Resp: 16 17 18 18   Weight:      SpO2: 96% 100% 100% 100%    Intake/Output Summary (Last 24 hours) at 10/16/15 0809 Last data filed at 10/15/15 1800  Gross per 24 hour  Intake    480 ml  Output   3000 ml  Net  -2520 ml    PULM  CTAB CV  RRR GI  soft, NTND VASC  L antecubital wound with VAC adherent, L axillary wound c/d/i  Laboratory CBC    Component Value Date/Time   WBC 8.8 10/15/2015 0749   HGB 9.6* 10/15/2015 0749   HCT 29.6* 10/15/2015 0749   PLT 246 10/15/2015 0749    BMET    Component Value Date/Time   NA 137 10/15/2015 0748   K 4.6 10/15/2015 0748   CL 97* 10/15/2015 0748   CO2 22 10/15/2015 0748   GLUCOSE 67 10/15/2015 0748   BUN 61* 10/15/2015 0748   CREATININE 14.70* 10/15/2015 0748   CREATININE 9.19* 02/06/2015 1206   CALCIUM 7.0* 10/15/2015 0748   GFRNONAA 3* 10/15/2015 0748   GFRAA 3* 10/15/2015 [place], MD [facility] [place]: [phone] Pager: [phone]  10/16/2015, 8:09 AM

## 2015-10-17 ENCOUNTER — Encounter (HOSPITAL_COMMUNITY): Payer: Self-pay | Admitting: General Practice

## 2015-10-17 LAB — RENAL FUNCTION PANEL
ANION GAP: 14 (ref 5–15)
Albumin: 2.3 g/dL — ABNORMAL LOW (ref 3.5–5.0)
BUN: 41 mg/dL — ABNORMAL HIGH (ref 6–20)
CALCIUM: 7.3 mg/dL — AB (ref 8.9–10.3)
CHLORIDE: 99 mmol/L — AB (ref 101–111)
CO2: 24 mmol/L (ref 22–32)
Creatinine, Ser: 12.23 mg/dL — ABNORMAL HIGH (ref 0.61–1.24)
GFR calc non Af Amer: 3 mL/min — ABNORMAL LOW (ref >60)
GFR, EST AFRICAN AMERICAN: 4 mL/min — AB (ref >60)
Glucose, Bld: 102 mg/dL — ABNORMAL HIGH (ref 65–99)
POTASSIUM: 4.3 mmol/L (ref 3.5–5.1)
Phosphorus: 6.2 mg/dL — ABNORMAL HIGH (ref 2.5–4.6)
Sodium: 137 mmol/L (ref 135–145)

## 2015-10-17 LAB — CBC
HEMATOCRIT: 30.9 % — AB (ref 39.0–52.0)
HEMOGLOBIN: 10.3 g/dL — AB (ref 13.0–17.0)
MCH: 35.6 pg — ABNORMAL HIGH (ref 26.0–34.0)
MCHC: 33.3 g/dL (ref 30.0–36.0)
MCV: 106.9 fL — ABNORMAL HIGH (ref 78.0–100.0)
Platelets: 285 10*3/uL (ref 150–400)
RBC: 2.89 MIL/uL — ABNORMAL LOW (ref 4.22–5.81)
RDW: 18 % — ABNORMAL HIGH (ref 11.5–15.5)
WBC: 10.4 10*3/uL (ref 4.0–10.5)

## 2015-10-17 LAB — GLUCOSE, CAPILLARY
GLUCOSE-CAPILLARY: 61 mg/dL — AB (ref 65–99)
Glucose-Capillary: 53 mg/dL — ABNORMAL LOW (ref 65–99)
Glucose-Capillary: 82 mg/dL (ref 65–99)
Glucose-Capillary: 134 mg/dL — ABNORMAL HIGH (ref 65–99)

## 2015-10-17 MED ORDER — SODIUM CHLORIDE 0.9 % IV SOLN: 100.0000 mL | INTRAVENOUS | Status: DC | PRN

## 2015-10-17 MED ORDER — LIDOCAINE-PRILOCAINE 2.5-2.5 % EX CREA: 1.0000 "application " | TOPICAL_CREAM | CUTANEOUS | Status: DC | PRN

## 2015-10-17 MED ORDER — PENTAFLUOROPROP-TETRAFLUOROETH EX AERO: 1.0000 "application " | INHALATION_SPRAY | CUTANEOUS | Status: DC | PRN

## 2015-10-17 MED ORDER — VANCOMYCIN HCL IN DEXTROSE 750-5 MG/150ML-% IV SOLN: 750.0000 mg | INTRAVENOUS | Status: DC

## 2015-10-17 MED ORDER — ALTEPLASE 2 MG IJ SOLR
2.0000 mg | Freq: Once | INTRAMUSCULAR | Status: DC | PRN
  Filled 2015-10-17: qty 2

## 2015-10-17 MED ORDER — HEPARIN SODIUM (PORCINE) 1000 UNIT/ML DIALYSIS: 20.0000 [IU]/kg | INTRAMUSCULAR | Status: DC | PRN

## 2015-10-17 MED ORDER — OXYCODONE HCL 5 MG PO TABS
5.0000 mg | ORAL_TABLET | Freq: Four times a day (QID) | ORAL | Status: DC | PRN
Start: 2015-10-17 — End: 2015-12-04

## 2015-10-17 MED ORDER — LIDOCAINE HCL (PF) 1 % IJ SOLN: 5.0000 mL | INTRAMUSCULAR | Status: DC | PRN

## 2015-10-17 MED ORDER — HEPARIN SODIUM (PORCINE) 1000 UNIT/ML DIALYSIS: 1000.0000 [IU] | INTRAMUSCULAR | Status: DC | PRN

## 2015-10-17 NOTE — Progress Notes (Addendum)
  [facility] Progress Note  Subjective  - POD #4  No complaints  Objective Filed Vitals:   10/17/15 1127 10/17/15 1318  BP: 168/74 128/52  Pulse: 79 77  Temp: 97 F (36.1 C) 97.8 F (36.6 C)  Resp: 16 18    Intake/Output Summary (Last 24 hours) at 10/17/15 1457 Last data filed at 10/17/15 1300  Gross per 24 hour  Intake    240 ml  Output   3462 ml  Net  -3222 ml    Left arm axillary incision clean with staples intact.  Left proximal incision with wound vac seal intact. Skin without surrounding erythema.   Assessment/Planning: 75 y.o. male is s/p: removal of left upper arm AV graft 4 Days Post-Op   Wound cultures and blood cultures NGTD Continue vancomycin for 1 week with dialysis. Have discussed with renal who will arrange.  Home with wound VAC. 1st dressing change tomorrow. TTS schedule.  F/u with Dr. Bridgett Larsson in 2 weeks.   Alvia Grove, PA-C 10/17/2015 2:57 PM --  Laboratory CBC    Component Value Date/Time   WBC 10.4 10/17/2015 0729   HGB 10.3* 10/17/2015 0729   HCT 30.9* 10/17/2015 0729   PLT 285 10/17/2015 0729    BMET    Component Value Date/Time   NA 137 10/17/2015 0736   K 4.3 10/17/2015 0736   CL 99* 10/17/2015 0736   CO2 24 10/17/2015 0736   GLUCOSE 102* 10/17/2015 0736   BUN 41* 10/17/2015 0736   CREATININE 12.23* 10/17/2015 0736   CREATININE 9.19* 02/06/2015 1206   CALCIUM 7.3* 10/17/2015 0736   GFRNONAA 3* 10/17/2015 0736   GFRAA 4* 10/17/2015 0736    COAG Lab Results  Component Value Date   INR 1.03 10/13/2015   No results found for: PTT  Antibiotics Anti-infectives    Start     Dose/Rate Route Frequency Ordered Stop   10/15/15 1200  vancomycin (VANCOCIN) IVPB 750 mg/150 ml premix     750 mg 150 mL/hr over 60 Minutes Intravenous Every M-W-F (Hemodialysis) 10/13/15 1638     10/13/15 1700  piperacillin-tazobactam (ZOSYN) IVPB 2.25 g     2.25 g 100 mL/hr over 30 Minutes Intravenous Every 8 hours 10/13/15  1636     10/13/15 1645  vancomycin (VANCOCIN) 1,500 mg in sodium chloride 0.9 % 500 mL IVPB     1,500 mg 250 mL/hr over 120 Minutes Intravenous  Once 10/13/15 1636 10/13/15 2142   10/13/15 1630  piperacillin-tazobactam (ZOSYN) IVPB 3.375 g  Status:  Discontinued     3.375 g 12.5 mL/hr over 240 Minutes Intravenous 3 times per day 10/13/15 1627 10/13/15 1636   10/13/15 1430  cefUROXime (ZINACEF) 1.5 g in dextrose 5 % 50 mL IVPB     1.5 g 100 mL/hr over 30 Minutes Intravenous To ShortStay Surgical 10/12/15 1422 10/13/15 [place], PA-C [facility] Office: [phone] Pager: [phone] 10/17/2015 2:57 PM    Addendum  I have independently interviewed and examined the patient, and I agree with the physician assistant's findings.  Ok to D/C home with VAC.  Follow up in office in two weeks for staple removal.  Continue on Vancomycin on HD for 1 week to complete abx regimen.  Adele Barthel, MD [facility] [place] Office: [phone] Pager: [phone]  10/17/2015, 3:29 PM

## 2015-10-17 NOTE — Care Management Note (Addendum)
Case Management Note  Patient Details  Name: Gerald Davenport MRN: [id number] Date of Birth: Sep 15, 1941  Subjective/Objective:   Pt admitted with upper arm graft infection                 Action/Plan:  Pt is from home with wife.  Pt will discharge home with wound vac.  Surgery and pt are agreeable with using [facility] for wound vac.  CM will continue to monitor for discharge needs   Expected Discharge Date:                  Expected Discharge Plan:  [place]  In-House Referral:     Discharge planning Services  CM Consult  Post Acute Care Choice:    Choice offered to:  Patient  DME Arranged:  Negative pressure wound device DME Agency:  [place]:  RN [facility] Agency:  [place]  Status of Service:  In process, will continue to follow  Medicare Important Message Given:  Yes Date Medicare IM Given:    Medicare IM give by:    Date Additional Medicare IM Given:    Additional Medicare Important Message give by:     If discussed at [place] of Stay Meetings, dates discussed:    Additional Comments: 10/17/2015  [facility] wound care and HHRN arranged, CM verified that current wound vac is the [facility] home vac and that dressing changes would begin on Thursday; per PA.  10/16/15 CM spoke with PA, PA will complete [facility] wound care form.  Potential discharge for tomorrow.  [facility] has completed wound vac form, agency actually placed their vac on pt today - pt will discharge tomorrow post HD  10/15/15 Pt offered choice, chose [facility] for both HH and DME, agency contacted and referral has been accepted.  CM requested HHRN order to manage wound vac.  [facility] order form for wound vac has been placed on shadow chart Maryclare Labrador, RN 10/17/2015, 9:20 AM

## 2015-10-17 NOTE — Procedures (Signed)
C/o soft stools.  On hemodialysis, no hemodynamic issue.  No edema.  Mild facial puffiness.  Will increase goal by 500cc.  May benefit from a 0.5 to 1.0 Kg drop in EDW.   OP Dialysis Orders: MWF [facility] 4h 71.5kg 2/2.25 bath P4 Hep 3000 R IJ cath (LUA AVG now removed)  Akanksha Bellmore C

## 2015-10-17 NOTE — Clinical Documentation Improvement (Signed)
Nephrology/Renal  Can the diagnosis of anemia be further specified?   Anemia of Chronic disease  Chronic Anemia, including the suspected or known cause  Iron Deficiency Anemia  Other  Clinically Undetermined  Document any associated diagnoses/conditions.  ESRD   Please exercise your independent, professional judgment when responding. A specific answer is not anticipated or expected.   Thank You,  Rolm Gala, RN, [place] [phone]

## 2015-10-17 NOTE — Progress Notes (Signed)
Hypoglycemic Event  CBG:53  Treatment:  4oz juice,peanut butter crackers  Symptoms: none  Follow-up CBG: [id number] CBG Result:82  Possible Reasons for Event: unknown  Comments/MD notified:no    [name], Gerald Davenport

## 2015-10-17 NOTE — Progress Notes (Signed)
Patient given discharge instructions medication list and paper prescription, patient and family verbalized understanding will discharge home as ordered. Darnella Zeiter, Bettina Gavia RN

## 2015-10-18 ENCOUNTER — Telehealth: Payer: Self-pay | Admitting: Vascular Surgery

## 2015-10-18 LAB — ANAEROBIC CULTURE

## 2015-10-18 LAB — GLUCOSE, CAPILLARY: GLUCOSE-CAPILLARY: 29 mg/dL — AB (ref 65–99)

## 2015-10-18 NOTE — Telephone Encounter (Signed)
-----   Message from Mena Goes, RN sent at 10/17/2015  3:09 PM EST ----- Regarding: schedule   ----- Message -----    From: Alvia Grove, PA-C    Sent: 10/17/2015   3:01 PM      To: Vvs Charge Pool  S/p removal of left arm AV graft 10/13/15  F/u with Dr. Bridgett Larsson in 2 weeks.   Thanks [name]

## 2015-10-18 NOTE — Telephone Encounter (Signed)
LM for pt re appt, dpm °

## 2015-10-19 LAB — CULTURE, BLOOD (ROUTINE X 2)
CULTURE: NO GROWTH
CULTURE: NO GROWTH

## 2015-10-22 NOTE — Discharge Summary (Signed)
[facility] Discharge Summary  Gerald Davenport 06-28-1941 75 y.o. male  [id number]  Admission Date: 10/13/2015  Discharge Date: 10/17/2015  Physician: Adele Barthel M.D.  Admission Diagnosis: Drainage from distal incision of left upper arm arteriovenous graft T82.898A  HPI:   This is a 75 y.o. male who presented for postoperative follow-up for: LUA looped AVG (Date: 10/04/15). The pt was sent from HD with drainage from his distal incision and concern with infection. The patient notes increasing pain in his distal arm. The patient notes no steal symptoms. The patient is not able to complete their activities of daily living. The patient's current symptoms are: drainage of clear fluid from distal incisiosn.  Hospital Course:  The patient was admitted to the hospital and taken to the operating room on 10/13/2015 and underwent: Excision of left upper arm looped arteriovenous graft and bovine patch angioplasty of high brachial artery.    The patient tolerated the procedure well and was transported to the PACU in good condition. Nephrology was consult for dialysis management.  The patient was doing well on postop day 1. He was done Vancomycin and Zosyn. The Penrose drain in his lateral left antecubital incision had minimal residual infection.Wet-to-dry dressings were started twice daily on his left antecubital incision.   The patient had a wound VAC applied to his left antecubital and incision on postop day 2. His wound cultures were still pending.  The patient remained stable on postop day 3. His wound cultures and blood cultures were negative to date on postop day 4. A wound VAC was arranged for home use. He was discharged home on postop day 4 in good condition. He was continued on vancomycin with HD for 1 week. He will follow up in 2 weeks for staple removal.    CBC    Component Value Date/Time   WBC 10.4 10/17/2015 0729   RBC 2.89* 10/17/2015 0729   HGB 10.3*  10/17/2015 0729   HCT 30.9* 10/17/2015 0729   PLT 285 10/17/2015 0729   MCV 106.9* 10/17/2015 0729   MCH 35.6* 10/17/2015 0729   MCHC 33.3 10/17/2015 0729   RDW 18.0* 10/17/2015 0729   LYMPHSABS 1.1 04/23/2015 0753   MONOABS 0.6 04/23/2015 0753   EOSABS 0.3 04/23/2015 0753   BASOSABS 0.0 04/23/2015 0753    BMET    Component Value Date/Time   NA 137 10/17/2015 0736   K 4.3 10/17/2015 0736   CL 99* 10/17/2015 0736   CO2 24 10/17/2015 0736   GLUCOSE 102* 10/17/2015 0736   BUN 41* 10/17/2015 0736   CREATININE 12.23* 10/17/2015 0736   CREATININE 9.19* 02/06/2015 1206   CALCIUM 7.3* 10/17/2015 0736   GFRNONAA 3* 10/17/2015 0736   GFRAA 4* 10/17/2015 0736     Discharge Instructions:   The patient is discharged to home with extensive instructions on wound care and progressive ambulation.  They are instructed not to drive or perform any heavy lifting until returning to see the physician in his office.  Discharge Instructions    Call MD for:  redness, tenderness, or signs of infection (pain, swelling, bleeding, redness, odor or green/yellow discharge around incision site)    Complete by:  As directed      Call MD for:  severe or increased pain, loss or decreased feeling  in affected limb(s)    Complete by:  As directed      Call MD for:  temperature >100.5    Complete by:  As directed  Discharge wound care:    Complete by:  As directed   Wash left upper arm wound (with staples) daily with soap and water and pat dry.     Driving Restrictions    Complete by:  As directed   No driving for 1 week and while on pain medication     Increase activity slowly    Complete by:  As directed   Walk with assistance use walker or cane as needed     Lifting restrictions    Complete by:  As directed   No lifting for 1 week     Resume previous diet    Complete by:  As directed            Discharge Diagnosis:  Drainage from distal incision of left upper arm arteriovenous graft  T82.898A  Secondary Diagnosis: Patient Active Problem List   Diagnosis Date Noted  . Infection 10/13/2015  . Infection of arteriovenous graft for hemodialysis ([place]) 10/12/2015  . ESRD on dialysis ([place]) 06/15/2015  . Diabetic neuropathy associated with type 2 diabetes mellitus ([place]) 05/25/2015  . Infection, dialysis vascular access ([place]) 04/19/2015  . Rectal bleeding 02/06/2015  . SOB (shortness of breath) 03/24/2014  . Abnormal EKG 03/24/2014  . Essential hypertension, benign 03/15/2014  . Mixed hyperlipidemia 03/15/2014  . H/O adenomatous polyp of colon 11/07/2013  . Constipation 06/15/2013  . Encounter for screening colonoscopy 11/02/2012  . Fecal incontinence 09/10/2012  . GERD (gastroesophageal reflux disease) 09/10/2012  . Diabetes mellitus 08/06/2011  . CRF (chronic renal failure) 08/06/2011   Past Medical History  Diagnosis Date  . Hypertension   . Hyperlipidemia   . Cancer [facility])     Prostate cancer treated with radiation therapy  . Thyroid disease     hyperparathyroidism  . Anemia   . Positive PPD   . Clot 05/2006    Arterial clot right leg   . Hemodialysis patient ([place]) 6/13  . Diverticulosis   . Anxiety   . Chronic kidney disease     dialysis Mon, Wednes, Fri  . Shortness of breath dyspnea     with exertion, when he has too much fluid  . Diabetes mellitus     Type II  . Constipation   . GERD (gastroesophageal reflux disease)   . Infection     drainage from distal incision of left upper arm arteriovenous graft       Medication List    TAKE these medications        ACCU-CHEK AVIVA PLUS test strip  Generic drug:  glucose blood     aspirin EC 81 MG tablet  Take 81-162 mg by mouth 2 (two) times daily. Take 2 tablets (162 mg) by mouth every morning and 1 tablet (81 mg) at night     buPROPion 150 MG 24 hr tablet  Commonly known as:  WELLBUTRIN XL  Take 150 mg by mouth daily.     calcium acetate 667 MG capsule  Commonly known as:  PHOSLO  Take  667-1,334 mg by mouth See admin instructions. Take 2 capsules (1334 mg) by mouth with breakfast and with supper, take 1 capsule (667 mg) with snacks or small meals     cloNIDine 0.2 MG tablet  Commonly known as:  CATAPRES  Take 0.2 mg by mouth 3 (three) times daily.     docusate sodium 100 MG capsule  Commonly known as:  COLACE  Take 1 capsule (100 mg total) by mouth 2 (two) times daily.  gabapentin 100 MG capsule  Commonly known as:  NEURONTIN  Take 100 mg by mouth 2 (two) times daily.     insulin detemir 100 UNIT/ML injection  Commonly known as:  LEVEMIR  Inject 25 Units into the skin at bedtime.     Linaclotide 145 MCG Caps capsule  Commonly known as:  LINZESS  Take 1 capsule (145 mcg total) by mouth daily. Take 30 minutes before breakfast     omeprazole 20 MG capsule  Commonly known as:  PRILOSEC  Take 20 mg by mouth 2 (two) times daily before a meal.     ONGLYZA 5 MG Tabs tablet  Generic drug:  saxagliptin HCl  Take 5 mg by mouth daily with breakfast.     oxyCODONE 5 MG immediate release tablet  Commonly known as:  ROXICODONE  Take 1 tablet (5 mg total) by mouth every 6 (six) hours as needed.     QUEtiapine 100 MG tablet  Commonly known as:  SEROQUEL  Take 100 mg by mouth at bedtime.     REFRESH OP  Place 1 drop into both eyes at bedtime.     Vancomycin 750 MG/150ML Soln  Commonly known as:  VANCOCIN  Inject 150 mLs (750 mg total) into the vein every Monday, Wednesday, and Friday with hemodialysis.        Oxycodone #20 No Refill  Disposition: Home  Patient's condition: is Good  Follow up: 1. Dr. Bridgett Larsson in 2 weeks   Virgina Jock, PA-C [facility] [phone] 10/22/2015  4:36 PM    Addendum  I have independently interviewed and examined the patient, and I agree with the physician assistant's discharge.  This patient presented in the office less than one week post-op from a left upper arm looped AVG placement.  There was  findings concerning for infection.  Additionally, there did not appear to be a strong thrill in this AVG.  Subsequently, I felt removal of the AVG was recommended.  This was completed without difficulty.  The brachial artery was patched with bovine pericardium.  A Penrose drain was left in the graft tract.  The antecubital wound was managed with a VAC dressing after a couple of days of wet-to-dry dressings.  The patient was discharged on a VAC dressing and Vancomycin on HD.  The patient will follow up in the office in 2 weeks for staple removal and wound check.   Adele Barthel, MD [facility] [place] Office: [phone] Pager: [phone]  10/22/2015, 8:42 PM

## 2015-10-23 ENCOUNTER — Encounter: Payer: Self-pay | Admitting: Vascular Surgery

## 2015-10-24 ENCOUNTER — Encounter: Payer: Self-pay | Admitting: Podiatry

## 2015-10-24 ENCOUNTER — Ambulatory Visit (INDEPENDENT_AMBULATORY_CARE_PROVIDER_SITE_OTHER): Payer: Medicare Other | Admitting: Podiatry

## 2015-10-24 ENCOUNTER — Telehealth: Payer: Self-pay | Admitting: *Deleted

## 2015-10-24 ENCOUNTER — Ambulatory Visit (INDEPENDENT_AMBULATORY_CARE_PROVIDER_SITE_OTHER): Payer: Medicare Other

## 2015-10-24 VITALS — BP 164/77 | HR 86 | Resp 12

## 2015-10-24 DIAGNOSIS — R0989 Other specified symptoms and signs involving the circulatory and respiratory systems: Secondary | ICD-10-CM

## 2015-10-24 DIAGNOSIS — R52 Pain, unspecified: Secondary | ICD-10-CM

## 2015-10-24 DIAGNOSIS — E1142 Type 2 diabetes mellitus with diabetic polyneuropathy: Secondary | ICD-10-CM | POA: Diagnosis not present

## 2015-10-24 NOTE — Patient Instructions (Signed)
Today her diabetic foot exam demonstrated decreased feeling and the need to evaluate his circulation in your left foot We will schedule an appointment for lower extremity arterial Doppler to evaluate his circulation in her left leg will notify you results of the Doppler Also we will certify you for diabetic shoes Diabetes and Foot Care Diabetes may cause you to have problems because of poor blood supply (circulation) to your feet and legs. This may cause the skin on your feet to become thinner, break easier, and heal more slowly. Your skin may become dry, and the skin may peel and crack. You may also have nerve damage in your legs and feet causing decreased feeling in them. You may not notice minor injuries to your feet that could lead to infections or more serious problems. Taking care of your feet is one of the most important things you can do for yourself.  HOME CARE INSTRUCTIONS  Wear shoes at all times, even in the house. Do not go barefoot. Bare feet are easily injured.  Check your feet daily for blisters, cuts, and redness. If you cannot see the bottom of your feet, use a mirror or ask someone for help.  Wash your feet with warm water (do not use hot water) and mild soap. Then pat your feet and the areas between your toes until they are completely dry. Do not soak your feet as this can dry your skin.  Apply a moisturizing lotion or petroleum jelly (that does not contain alcohol and is unscented) to the skin on your feet and to dry, brittle toenails. Do not apply lotion between your toes.  Trim your toenails straight across. Do not dig under them or around the cuticle. File the edges of your nails with an emery board or nail file.  Do not cut corns or calluses or try to remove them with medicine.  Wear clean socks or stockings every day. Make sure they are not too tight. Do not wear knee-high stockings since they may decrease blood flow to your legs.  Wear shoes that fit properly and  have enough cushioning. To break in new shoes, wear them for just a few hours a day. This prevents you from injuring your feet. Always look in your shoes before you put them on to be sure there are no objects inside.  Do not cross your legs. This may decrease the blood flow to your feet.  If you find a minor scrape, cut, or break in the skin on your feet, keep it and the skin around it clean and dry. These areas may be cleansed with mild soap and water. Do not cleanse the area with peroxide, alcohol, or iodine.  When you remove an adhesive bandage, be sure not to damage the skin around it.  If you have a wound, look at it several times a day to make sure it is healing.  Do not use heating pads or hot water bottles. They may burn your skin. If you have lost feeling in your feet or legs, you may not know it is happening until it is too late.  Make sure your health care provider performs a complete foot exam at least annually or more often if you have foot problems. Report any cuts, sores, or bruises to your health care provider immediately. SEEK MEDICAL CARE IF:   You have an injury that is not healing.  You have cuts or breaks in the skin.  You have an ingrown nail.  You notice  redness on your legs or feet.  You feel burning or tingling in your legs or feet.  You have pain or cramps in your legs and feet.  Your legs or feet are numb.  Your feet always feel cold. SEEK IMMEDIATE MEDICAL CARE IF:   There is increasing redness, swelling, or pain in or around a wound.  There is a red line that goes up your leg.  Pus is coming from a wound.  You develop a fever or as directed by your health care provider.  You notice a bad smell coming from an ulcer or wound.   This information is not intended to replace advice given to you by your health care provider. Make sure you discuss any questions you have with your health care provider.   Document Released: 09/12/2000 Document Revised:  05/18/2013 Document Reviewed: 02/22/2013 Elsevier Interactive Patient Education [employer].

## 2015-10-24 NOTE — Progress Notes (Signed)
   Subjective:    Patient ID: Gerald Davenport, male    DOB: January 14, 1941, 75 y.o.   MRN: [id number]  HPI   This patient presents today complaining of a general discomfort and pressure-like sensation in the left foot on and off weightbearing for the past 10 years with a gradual increase of symptoms. He denies any specific treatment for these symptoms at this time. 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 Patient is diabetic with a history of BK amputation right. He is denying any open wounds in the left foot Patient is that he continues to smoke  Review of Systems  Genitourinary: Positive for frequency.  Skin: Positive for color change and rash.       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 Patient wearing over-the-counter athletic style shoes  X-ray examination weightbearing left foot dated 10/24/2015  Intact bony structures without fracture and/or dislocation Hammertoe second Bone density and joint spaces unremarkable No increase in soft tissue density  Radiographic impression: No acute bony abnormality noted left foot     Assessment & Plan:   Assessment: BK amputation right Absent pedal pulses left 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 I am referring patient to  vascular lab for lower extremity arterial Doppler left with ABIs and TBI's for the indication of diabetic with nonpalpable pedal pulses and history of amputation right  Obtain certification for diabetic shoes which I think will reduce some of the pressure-like sensation in the left foot associated with weight transfer from BK amputation right  Indications for diabetic shoes would include BK amputation right  Peripheral neuropathy with loss of vibratory and protective sensation and polyneuropathy Diminished dorsalis pedis and posterior tibial pulses left Hammertoe second left   Notify patient of results arterial Doppler

## 2015-10-29 ENCOUNTER — Other Ambulatory Visit: Payer: Self-pay | Admitting: Podiatry

## 2015-10-29 DIAGNOSIS — I739 Peripheral vascular disease, unspecified: Secondary | ICD-10-CM

## 2015-10-29 DIAGNOSIS — R0989 Other specified symptoms and signs involving the circulatory and respiratory systems: Secondary | ICD-10-CM

## 2015-10-29 DIAGNOSIS — Z89511 Acquired absence of right leg below knee: Secondary | ICD-10-CM

## 2015-11-01 NOTE — Telephone Encounter (Signed)
Entered in error

## 2015-11-02 ENCOUNTER — Encounter: Payer: Self-pay | Admitting: Vascular Surgery

## 2015-11-02 ENCOUNTER — Ambulatory Visit (INDEPENDENT_AMBULATORY_CARE_PROVIDER_SITE_OTHER): Payer: Medicare Other | Admitting: Vascular Surgery

## 2015-11-02 VITALS — BP 118/66 | HR 81 | Temp 97.5°F | Resp 16 | Ht 68.0 in | Wt 168.0 lb

## 2015-11-02 DIAGNOSIS — T827XXA Infection and inflammatory reaction due to other cardiac and vascular devices, implants and grafts, initial encounter: Secondary | ICD-10-CM

## 2015-11-02 NOTE — Progress Notes (Signed)
    Postoperative Access Visit   History of Present Illness  Gerald Davenport is a 75 y.o. year old male who presents for postoperative follow-up for: Exc LUA looped AVG, BPA L high brachial artery (Date: 10/13/15).  The patient's axillary incision is healed.  Home HTH has been doing VAC dsg on antecubital incision.  The patient is able to complete their activities of daily living.  The patient's current symptoms are: none.  For VQI Use Only  PRE-ADM LIVING: Home  AMB STATUS: Ambulatory  Physical Examination Filed Vitals:   11/02/15 1453  BP: 118/66  Pulse: 81  Temp: 97.5 F (36.4 C)  Resp: 16    LUE: axillary incision healed, staples removed, antecubital incision greatly contracted with good granulation, minimal residual wound amendable to wet-to-dry dressing, warm hand, intact motor and sensation  Medical Decision Making  Gerald Davenport is a 75 y.o. year old male who presents s/p LUA looped AVG exc, BPA L Brachial artery .  D/C VAC  Wet-to-dry dressing to L antecubital incision qd-bid  Follow up in 2 weeks for wound check  Patient has had a very poor history with AVG, now requiring excision of two AVG, making me very reluctant to do a thigh AVG.  Thank you for allowing [place] to participate in this patient's care.  Adele Barthel, MD [facility] [place] Office: [phone] Pager: [phone]  11/02/2015, 3:24 PM

## 2015-11-06 ENCOUNTER — Ambulatory Visit (HOSPITAL_COMMUNITY)
Admission: RE | Admit: 2015-11-06 | Discharge: 2015-11-06 | Disposition: A | Discharge status: Home / Self Care | Payer: Medicare Other | Source: Ambulatory Visit | Attending: Podiatry | Admitting: Podiatry

## 2015-11-06 DIAGNOSIS — Z89511 Acquired absence of right leg below knee: Secondary | ICD-10-CM | POA: Diagnosis not present

## 2015-11-06 DIAGNOSIS — E1122 Type 2 diabetes mellitus with diabetic chronic kidney disease: Secondary | ICD-10-CM | POA: Insufficient documentation

## 2015-11-06 DIAGNOSIS — N189 Chronic kidney disease, unspecified: Secondary | ICD-10-CM | POA: Insufficient documentation

## 2015-11-06 DIAGNOSIS — R938 Abnormal findings on diagnostic imaging of other specified body structures: Secondary | ICD-10-CM | POA: Diagnosis not present

## 2015-11-06 DIAGNOSIS — R0989 Other specified symptoms and signs involving the circulatory and respiratory systems: Secondary | ICD-10-CM | POA: Insufficient documentation

## 2015-11-06 DIAGNOSIS — I129 Hypertensive chronic kidney disease with stage 1 through stage 4 chronic kidney disease, or unspecified chronic kidney disease: Secondary | ICD-10-CM | POA: Insufficient documentation

## 2015-11-06 DIAGNOSIS — I739 Peripheral vascular disease, unspecified: Secondary | ICD-10-CM | POA: Diagnosis not present

## 2015-11-06 DIAGNOSIS — E785 Hyperlipidemia, unspecified: Secondary | ICD-10-CM | POA: Insufficient documentation

## 2015-11-07 ENCOUNTER — Telehealth: Payer: Self-pay | Admitting: Cardiovascular Disease

## 2015-11-07 ENCOUNTER — Telehealth: Payer: Self-pay | Admitting: *Deleted

## 2015-11-07 NOTE — Telephone Encounter (Signed)
Pending result notes from Dr. Gwenlyn Found.  Dr. Phoebe Perch notes: ---------------------------------------------------------------------------------------------------------- Notes Recorded by Gean Birchwood, DPM on 11/07/2015 at 7:58 AM Chronic right below knee amputation. CW waveforms are stable in amplitude. Stable moderate left ABI. Abnormal left great toe-brachial index  Call Dr Gwenlyn Found ask if they want to have a follow on this arterial doppler and then call patient If Dr. Gwenlyn Found does not want to see patient then will see patient at next scheduled visit ---------------------------------------------------------------------------------------------------------------------

## 2015-11-07 NOTE — Telephone Encounter (Addendum)
-----   Message from Gean Birchwood, DPM sent at 11/07/2015  7:58 AM EST ----- Chronic right below knee amputation. CW waveforms are stable in amplitude. Stable moderate left ABI. Abnormal left great toe-brachial index  Call Dr Gwenlyn Found  ask if they want to have a follow on this arterial doppler and then call patient If Dr. Gwenlyn Found does not want to see patient then will see patient at next scheduled visit.  11/07/2015- LEFT MESSAGE with [facility] - Butch Penny to ask Dr. Gwenlyn Found if he would like to see pt for abnormal dopplers as requested by Dr. Jacqualyn Posey.  11/09/2015-J. [place] documented a message from [place] stating Dr. Gwenlyn Found said it would be okay if pt was followed by his office as needed.  Dr. Amalia Hailey will follow up with pt at next visit.

## 2015-11-07 NOTE — Telephone Encounter (Signed)
New message     Pt had abn dopplar.  They are calling to see if Dr Gwenlyn Found wanted to follow up and see this patient?

## 2015-11-08 NOTE — Telephone Encounter (Signed)
LMT

## 2015-11-08 NOTE — Telephone Encounter (Signed)
Left ABI stable

## 2015-11-09 NOTE — Telephone Encounter (Signed)
Left msg w/ [place] to call.

## 2015-11-16 ENCOUNTER — Encounter: Payer: Self-pay | Admitting: Vascular Surgery

## 2015-11-23 ENCOUNTER — Encounter: Payer: Self-pay | Admitting: Vascular Surgery

## 2015-11-23 ENCOUNTER — Ambulatory Visit (INDEPENDENT_AMBULATORY_CARE_PROVIDER_SITE_OTHER): Payer: Medicare Other | Admitting: Vascular Surgery

## 2015-11-23 VITALS — BP 125/66 | HR 91 | Temp 97.8°F | Ht 68.0 in | Wt 161.0 lb

## 2015-11-23 DIAGNOSIS — T827XXA Infection and inflammatory reaction due to other cardiac and vascular devices, implants and grafts, initial encounter: Secondary | ICD-10-CM

## 2015-11-23 NOTE — Progress Notes (Signed)
POST OPERATIVE OFFICE NOTE    CC:  F/u for surgery  HPI:  This is a 75 y.o. male who is s/p Removal of left upper arm looped AVG and bovine patch angioplasty of high brachial artery.  He did require a wound vac and that was discontinued at the last visit and wet to dry dressing started.  He states that his wound has healed up.  He is currently dialyzing via right IJ TDC.  He dialyzes M/W/F.    No Known Allergies  Current Outpatient Prescriptions  Medication Sig Dispense Refill  . ACCU-CHEK AVIVA PLUS test strip     . aspirin EC 81 MG tablet Take 81-162 mg by mouth 2 (two) times daily. Take 2 tablets (162 mg) by mouth every morning and 1 tablet (81 mg) at night    . buPROPion (WELLBUTRIN XL) 150 MG 24 hr tablet Take 150 mg by mouth daily.    . calcium acetate (PHOSLO) 667 MG capsule Take 667-1,334 mg by mouth See admin instructions. Take 2 capsules (1334 mg) by mouth with breakfast and with supper, take 1 capsule (667 mg) with snacks or small meals    . cloNIDine (CATAPRES) 0.2 MG tablet Take 0.2 mg by mouth 3 (three) times daily.     Marland Kitchen docusate sodium (COLACE) 100 MG capsule Take 1 capsule (100 mg total) by mouth 2 (two) times daily. (Patient taking differently: Take 100 mg by mouth See admin instructions. Take 1 capsule (100 mg) by mouth on Saturday, Tuesday, Thursday, Sunday (non-dialysis days)) 10 capsule 0  . gabapentin (NEURONTIN) 100 MG capsule Take 100 mg by mouth 2 (two) times daily.    . insulin detemir (LEVEMIR) 100 UNIT/ML injection Inject 25 Units into the skin at bedtime.     . Linaclotide (LINZESS) 145 MCG CAPS capsule Take 1 capsule (145 mcg total) by mouth daily. Take 30 minutes before breakfast (Patient taking differently: Take 145 mcg by mouth See admin instructions. Take 30 minutes before breakfast on Sunday, Tuesday, Thursday, Saturday (non-dialysis days)) 30 capsule 11  . omeprazole (PRILOSEC) 20 MG capsule Take 20 mg by mouth 2 (two) times daily before a meal.     .  oxyCODONE (ROXICODONE) 5 MG immediate release tablet Take 1 tablet (5 mg total) by mouth every 6 (six) hours as needed. 30 tablet 0  . Polyvinyl Alcohol-Povidone (REFRESH OP) Place 1 drop into both eyes at bedtime.    Marland Kitchen QUEtiapine (SEROQUEL) 100 MG tablet Take 100 mg by mouth at bedtime.    . saxagliptin HCl (ONGLYZA) 5 MG TABS tablet Take 5 mg by mouth daily with breakfast.     . Vancomycin (VANCOCIN) 750 MG/150ML SOLN Inject 150 mLs (750 mg total) into the vein every Monday, Wednesday, and Friday with hemodialysis. 450 mL 0   No current facility-administered medications for this visit.     ROS:  See HPI  Physical Exam:  Filed Vitals:   11/23/15 1549  BP: 125/66  Pulse: 91  Temp: 97.8 F (36.6 C)    Incision:  Well healed Extremities:  2+ left radial pulse    Assessment/Plan:  This is a 75 y.o. male who is s/p: Removal of left upper arm looped AVG and bovine patch angioplasty of high brachial artery.  -presents today for wound check and his left antecubital incision has completely healed.  -his options are minimal for new access.  He has decreased ABI's on the left leg and a BKA on the right.   -Dr. Bridgett Larsson  will plan on a bilateral arm and central venogram on 12/06/15 to evaluate to see if there are any available options for upper extremity access to determine next step.   Leontine Locket, PA-C [facility] [phone]  Clinic MD:  Pt seen and examined with Dr. Bridgett Larsson   Addendum  I have independently interviewed and examined the patient, and I agree with the physician assistant's findings.  We discussed brief the options.  Given this patient's significant PAD in both legs, s/p R BKA, I don't think thigh AVG is a great idea.  Will try to see if R arm especially has any residual vein target for a bypass with a B arm and central venogram.  Adele Barthel, MD [facility] [place] Office: [phone] Pager: [phone]  11/23/2015, 7:03  PM

## 2015-11-27 ENCOUNTER — Ambulatory Visit: Payer: Medicare Other | Admitting: *Deleted

## 2015-11-27 DIAGNOSIS — E1142 Type 2 diabetes mellitus with diabetic polyneuropathy: Secondary | ICD-10-CM

## 2015-11-30 ENCOUNTER — Other Ambulatory Visit: Payer: Self-pay

## 2015-12-03 NOTE — Progress Notes (Signed)
Patient ID: Gerald Davenport, male   DOB: 04-15-1941, 75 y.o.   MRN: [id number] Patient presents to be scanned and measured for diabetic shoes and inserts.

## 2015-12-06 ENCOUNTER — Ambulatory Visit (HOSPITAL_COMMUNITY)
Admission: RE | Admit: 2015-12-06 | Discharge: 2015-12-06 | Disposition: A | Discharge status: Home / Self Care | Payer: Medicare Other | Source: Ambulatory Visit | Attending: Vascular Surgery | Admitting: Vascular Surgery

## 2015-12-06 ENCOUNTER — Encounter (HOSPITAL_COMMUNITY): Payer: Self-pay | Admitting: Vascular Surgery

## 2015-12-06 ENCOUNTER — Encounter (HOSPITAL_COMMUNITY)
Admission: RE | Disposition: A | Discharge status: Home / Self Care | Payer: Self-pay | Source: Ambulatory Visit | Attending: Vascular Surgery

## 2015-12-06 DIAGNOSIS — N186 End stage renal disease: Secondary | ICD-10-CM | POA: Diagnosis present

## 2015-12-06 DIAGNOSIS — Z992 Dependence on renal dialysis: Secondary | ICD-10-CM

## 2015-12-06 DIAGNOSIS — N185 Chronic kidney disease, stage 5: Secondary | ICD-10-CM | POA: Diagnosis not present

## 2015-12-06 HISTORY — PX: PERIPHERAL VASCULAR CATHETERIZATION: SHX172C

## 2015-12-06 LAB — POCT I-STAT, CHEM 8
BUN: 39 mg/dL — ABNORMAL HIGH (ref 6–20)
CALCIUM ION: 0.95 mmol/L — AB (ref 1.13–1.30)
Chloride: 102 mmol/L (ref 101–111)
Creatinine, Ser: 9 mg/dL — ABNORMAL HIGH (ref 0.61–1.24)
GLUCOSE: 98 mg/dL (ref 65–99)
HCT: 39 % (ref 39.0–52.0)
HEMOGLOBIN: 13.3 g/dL (ref 13.0–17.0)
Potassium: 4.3 mmol/L (ref 3.5–5.1)
Sodium: 140 mmol/L (ref 135–145)
TCO2: 27 mmol/L (ref 0–100)

## 2015-12-06 LAB — GLUCOSE, CAPILLARY: Glucose-Capillary: 114 mg/dL — ABNORMAL HIGH (ref 65–99)

## 2015-12-06 SURGERY — UPPER EXTREMITY VENOGRAPHY
Anesthesia: LOCAL | Laterality: Bilateral

## 2015-12-06 MED ORDER — SODIUM CHLORIDE 0.9% FLUSH: 3.0000 mL | Freq: Two times a day (BID) | INTRAVENOUS | Status: DC

## 2015-12-06 MED ORDER — SODIUM CHLORIDE 0.9% FLUSH: 3.0000 mL | INTRAVENOUS | Status: DC | PRN

## 2015-12-06 MED ORDER — ACETAMINOPHEN 325 MG PO TABS: 650.0000 mg | ORAL_TABLET | ORAL | Status: DC | PRN

## 2015-12-06 MED ORDER — IODIXANOL 320 MG/ML IV SOLN
INTRAVENOUS | Status: DC | PRN
  Administered 2015-12-06: 50 mL via INTRAVENOUS

## 2015-12-06 MED ORDER — SODIUM CHLORIDE 0.9 % IV SOLN: 250.0000 mL | INTRAVENOUS | Status: DC | PRN

## 2015-12-06 SURGICAL SUPPLY — 2 items
TRANSDUCER W/STOPCOCK (MISCELLANEOUS) ×2 IMPLANT
TUBING CIL FLEX 10 FLL-RA (TUBING) ×1 IMPLANT

## 2015-12-06 NOTE — Op Note (Signed)
    OPERATIVE NOTE   PROCEDURE: 1. bilateral arm and central venogram   PRE-OPERATIVE DIAGNOSIS: end stage renal disease  POST-OPERATIVE DIAGNOSIS: same as above   SURGEON: Adele Barthel, MD  ANESTHESIA: local  ESTIMATED BLOOD LOSS: 5 cc  FINDING(S): 1. Right arm:  No suitable brachial, basilic, or cephalic veins  Long right subclavian vein stent that is occluded  Patent right innominate vein  Patent superior vena cava   Right internal jugular vein tunneled dialysis catheter in place that does not fully obturate the superior vena cava   2.  Left arm:  No suitable brachial, basilic, or cephalic veins  Patent axillary vein: doubt surgically accessible however  Patent subclavian vein   Patent innominate vein  SPECIMEN(S):  None  CONTRAST: 50 cc  INDICATIONS: Gerald Davenport is a 75 y.o. male who presents with end stage renal disease.  The patient is scheduled for bilateral arm and central venogram to help determine the availability of proximal veins for permanent access placement.  The patient is aware the risks include but are not limited to: bleeding, infection, thrombosis of the cannulated access, and possible anaphylactic reaction to the contrast.  The patient is aware of the risks of the procedure and elects to proceed forward.  DESCRIPTION: After full informed written consent was obtained, the patient was brought back to the angiography suite and placed supine upon the angiography table.  The patient was connected to monitoring equipment.  The right forearm IV was connected to IV extension tubing.  Hand injections were completed to image the arm veins and central venous structures, the findings of which are listed above.  The left forearm IV was connected to IV extension tubing.  Hand injections were completed to image the arm veins and central venous structures, the findings of which are listed above.    Based on the images, the patient should be a candidate for a  Right HeRO graft-catheter.  This patient has had perfusion issues with the left arm in the past and most recently had an infection of the looped upper arm arteriovenous graft on that side.   COMPLICATIONS: none  CONDITION: stable   Adele Barthel, MD [facility] [place] Office: [phone] Pager: [phone]  12/06/2015 7:52 AM

## 2015-12-06 NOTE — H&P (View-Only) (Signed)
POST OPERATIVE OFFICE NOTE    CC:  F/u for surgery  HPI:  This is a 75 y.o. male who is s/p Removal of left upper arm looped AVG and bovine patch angioplasty of high brachial artery.  He did require a wound vac and that was discontinued at the last visit and wet to dry dressing started.  He states that his wound has healed up.  He is currently dialyzing via right IJ TDC.  He dialyzes M/W/F.    No Known Allergies  Current Outpatient Prescriptions  Medication Sig Dispense Refill  . ACCU-CHEK AVIVA PLUS test strip     . aspirin EC 81 MG tablet Take 81-162 mg by mouth 2 (two) times daily. Take 2 tablets (162 mg) by mouth every morning and 1 tablet (81 mg) at night    . buPROPion (WELLBUTRIN XL) 150 MG 24 hr tablet Take 150 mg by mouth daily.    . calcium acetate (PHOSLO) 667 MG capsule Take 667-1,334 mg by mouth See admin instructions. Take 2 capsules (1334 mg) by mouth with breakfast and with supper, take 1 capsule (667 mg) with snacks or small meals    . cloNIDine (CATAPRES) 0.2 MG tablet Take 0.2 mg by mouth 3 (three) times daily.     Marland Kitchen docusate sodium (COLACE) 100 MG capsule Take 1 capsule (100 mg total) by mouth 2 (two) times daily. (Patient taking differently: Take 100 mg by mouth See admin instructions. Take 1 capsule (100 mg) by mouth on Saturday, Tuesday, Thursday, Sunday (non-dialysis days)) 10 capsule 0  . gabapentin (NEURONTIN) 100 MG capsule Take 100 mg by mouth 2 (two) times daily.    . insulin detemir (LEVEMIR) 100 UNIT/ML injection Inject 25 Units into the skin at bedtime.     . Linaclotide (LINZESS) 145 MCG CAPS capsule Take 1 capsule (145 mcg total) by mouth daily. Take 30 minutes before breakfast (Patient taking differently: Take 145 mcg by mouth See admin instructions. Take 30 minutes before breakfast on Sunday, Tuesday, Thursday, Saturday (non-dialysis days)) 30 capsule 11  . omeprazole (PRILOSEC) 20 MG capsule Take 20 mg by mouth 2 (two) times daily before a meal.     .  oxyCODONE (ROXICODONE) 5 MG immediate release tablet Take 1 tablet (5 mg total) by mouth every 6 (six) hours as needed. 30 tablet 0  . Polyvinyl Alcohol-Povidone (REFRESH OP) Place 1 drop into both eyes at bedtime.    Marland Kitchen QUEtiapine (SEROQUEL) 100 MG tablet Take 100 mg by mouth at bedtime.    . saxagliptin HCl (ONGLYZA) 5 MG TABS tablet Take 5 mg by mouth daily with breakfast.     . Vancomycin (VANCOCIN) 750 MG/150ML SOLN Inject 150 mLs (750 mg total) into the vein every Monday, Wednesday, and Friday with hemodialysis. 450 mL 0   No current facility-administered medications for this visit.     ROS:  See HPI  Physical Exam:  Filed Vitals:   11/23/15 1549  BP: 125/66  Pulse: 91  Temp: 97.8 F (36.6 C)    Incision:  Well healed Extremities:  2+ left radial pulse    Assessment/Plan:  This is a 75 y.o. male who is s/p: Removal of left upper arm looped AVG and bovine patch angioplasty of high brachial artery.  -presents today for wound check and his left antecubital incision has completely healed.  -his options are minimal for new access.  He has decreased ABI's on the left leg and a BKA on the right.   -Dr. Bridgett Larsson  will plan on a bilateral arm and central venogram on 12/06/15 to evaluate to see if there are any available options for upper extremity access to determine next step.   Leontine Locket, PA-C [facility] [phone]  Clinic MD:  Pt seen and examined with Dr. Bridgett Larsson   Addendum  I have independently interviewed and examined the patient, and I agree with the physician assistant's findings.  We discussed brief the options.  Given this patient's significant PAD in both legs, s/p R BKA, I don't think thigh AVG is a great idea.  Will try to see if R arm especially has any residual vein target for a bypass with a B arm and central venogram.  Adele Barthel, MD [facility] [place] Office: [phone] Pager: [phone]  11/23/2015, 7:03  PM

## 2015-12-06 NOTE — Interval H&P Note (Signed)
History and Physical Interval Note:  12/06/2015 7:17 AM  Gerald Davenport  has presented today for surgery, with the diagnosis of end stage renal  The various methods of treatment have been discussed with the patient and family. After consideration of risks, benefits and other options for treatment, the patient has consented to  Procedure(s): Upper Extremity Venography/ central venogram (Bilateral) as a surgical intervention .  The patient's history has been reviewed, patient examined, no change in status, stable for surgery.  I have reviewed the patient's chart and labs.  Questions were answered to the patient's satisfaction.     Adele Barthel

## 2015-12-06 NOTE — Discharge Instructions (Signed)
Venogram, Care After °Refer to this sheet in the next few weeks. These instructions provide you with information on caring for yourself after your procedure. Your health care provider may also give you more specific instructions. Your treatment has been planned according to current medical practices, but problems sometimes occur. Call your health care provider if you have any problems or questions after your procedure. °WHAT TO EXPECT AFTER THE PROCEDURE °After your procedure, it is typical to have the following sensations: °· Mild discomfort at the catheter insertion site. °HOME CARE INSTRUCTIONS  °· Take all medicines exactly as directed. °· Follow any prescribed diet. °· Follow instructions regarding both rest and physical activity. °· Drink more fluids for the first several days after the procedure in order to help flush dye from your kidneys. °SEEK MEDICAL CARE IF: °· You develop a rash. °· You have fever not controlled by medicine. °SEEK IMMEDIATE MEDICAL CARE IF: °· There is pain, drainage, bleeding, redness, swelling, warmth or a red streak at the site of the IV tube. °· The extremity where your IV tube was placed becomes discolored, numb, or cool. °· You have difficulty breathing or shortness of breath. °· You develop chest pain. °· You have excessive dizziness or fainting. °  °This information is not intended to replace advice given to you by your health care provider. Make sure you discuss any questions you have with your health care provider. °  °Document Released: 07/06/2013 Document Revised: 09/20/2013 Document Reviewed: 07/06/2013 °Elsevier Interactive Patient Education ©2016 [employer] ° °

## 2015-12-10 ENCOUNTER — Other Ambulatory Visit: Payer: Self-pay | Admitting: Physician Assistant

## 2015-12-14 ENCOUNTER — Other Ambulatory Visit: Payer: Self-pay

## 2015-12-19 ENCOUNTER — Ambulatory Visit (INDEPENDENT_AMBULATORY_CARE_PROVIDER_SITE_OTHER): Payer: Medicare Other | Admitting: Podiatry

## 2015-12-19 ENCOUNTER — Encounter: Payer: Self-pay | Admitting: Podiatry

## 2015-12-19 VITALS — BP 142/76 | HR 64 | Resp 12

## 2015-12-19 DIAGNOSIS — E1140 Type 2 diabetes mellitus with diabetic neuropathy, unspecified: Secondary | ICD-10-CM

## 2015-12-19 DIAGNOSIS — Z89431 Acquired absence of right foot: Secondary | ICD-10-CM | POA: Diagnosis not present

## 2015-12-19 DIAGNOSIS — E1142 Type 2 diabetes mellitus with diabetic polyneuropathy: Secondary | ICD-10-CM

## 2015-12-19 DIAGNOSIS — Z89511 Acquired absence of right leg below knee: Secondary | ICD-10-CM

## 2015-12-19 DIAGNOSIS — Z8679 Personal history of other diseases of the circulatory system: Secondary | ICD-10-CM

## 2015-12-19 DIAGNOSIS — M2042 Other hammer toe(s) (acquired), left foot: Secondary | ICD-10-CM

## 2015-12-19 DIAGNOSIS — L84 Corns and callosities: Secondary | ICD-10-CM | POA: Diagnosis not present

## 2015-12-19 NOTE — Patient Instructions (Signed)

## 2015-12-19 NOTE — Progress Notes (Signed)
Patient ID: Gerald Davenport, male   DOB: 1941/09/11, 75 y.o.   MRN: [id number]    Patient presents for diabetic shoe pick up, shoes are tried on for good fit.  Patient received 1 Pair New Balance Double strap walker black in men's 11 wide and 3 custom molded diabetic inserts with deep pockets for left 5th met head.  Patient has right lower leg amputation.  Verbal and written break in and wear instructions given.  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 Good fit of new balance shoes with custom molded inserts 3 left Hammertoe second left  Assessment: Diabetic with peripheral neuropathy Diabetic with peripheral arterial disease No open skin lesions left Satisfactory fit of diabetic shoe with custom inserts 3  Plan: As patient has no open wounds today will continue to monitor patient Debride plantar callus fifth MPJ left without any bleeding  Dispensed diabetic shoes 2 with custom inserts 3 Indications for diabetic shoes diabetic with peripheral arterial disease and hammertoe and BK amputation Wear instruction provided for diabetic shoes  Reappoint 3 months redneck schedule visit for debridement of mycotic toenails, no greater than 3 months

## 2015-12-26 ENCOUNTER — Ambulatory Visit: Payer: Medicare Other | Admitting: Podiatry

## 2015-12-28 ENCOUNTER — Encounter (HOSPITAL_COMMUNITY): Payer: Self-pay | Admitting: *Deleted

## 2015-12-31 ENCOUNTER — Encounter (HOSPITAL_COMMUNITY): Payer: Self-pay | Admitting: *Deleted

## 2015-12-31 MED ORDER — SODIUM CHLORIDE 0.9 % IV SOLN: INTRAVENOUS | Status: DC

## 2015-12-31 MED ORDER — CEFUROXIME SODIUM 1.5 G IJ SOLR
1.5000 g | INTRAMUSCULAR | Status: AC
  Administered 2016-01-01: 1.5 g via INTRAVENOUS
  Filled 2015-12-31: qty 1.5

## 2015-12-31 NOTE — Progress Notes (Signed)
Pt denies cardiac history, chest pain or sob. Pt is diabetic, states that he has the instructions about his insulin at home (he's at dialysis now). I instructed him to follow those instructions about his Levemir insulin for tonight. He voiced understanding. Pt's last A1C was 6.7 on 10/13/15. Pt states he will bring his medications with him tomorrow, he states he has had one added on recently but can't remember the name of it.

## 2016-01-01 ENCOUNTER — Ambulatory Visit (HOSPITAL_COMMUNITY): Payer: Medicare Other

## 2016-01-01 ENCOUNTER — Encounter (HOSPITAL_COMMUNITY)
Admission: RE | Disposition: A | Discharge status: Home / Self Care | Payer: Self-pay | Source: Ambulatory Visit | Attending: Vascular Surgery

## 2016-01-01 ENCOUNTER — Ambulatory Visit (HOSPITAL_COMMUNITY): Payer: Medicare Other | Admitting: Anesthesiology

## 2016-01-01 ENCOUNTER — Ambulatory Visit (HOSPITAL_COMMUNITY)
Admission: RE | Admit: 2016-01-01 | Discharge: 2016-01-01 | Disposition: A | Discharge status: Home / Self Care | Payer: Medicare Other | Source: Ambulatory Visit | Attending: Vascular Surgery | Admitting: Vascular Surgery

## 2016-01-01 ENCOUNTER — Encounter (HOSPITAL_COMMUNITY): Payer: Self-pay | Admitting: Certified Registered"

## 2016-01-01 DIAGNOSIS — Z792 Long term (current) use of antibiotics: Secondary | ICD-10-CM | POA: Insufficient documentation

## 2016-01-01 DIAGNOSIS — Z89511 Acquired absence of right leg below knee: Secondary | ICD-10-CM | POA: Insufficient documentation

## 2016-01-01 DIAGNOSIS — K219 Gastro-esophageal reflux disease without esophagitis: Secondary | ICD-10-CM | POA: Insufficient documentation

## 2016-01-01 DIAGNOSIS — E785 Hyperlipidemia, unspecified: Secondary | ICD-10-CM | POA: Diagnosis not present

## 2016-01-01 DIAGNOSIS — Z79899 Other long term (current) drug therapy: Secondary | ICD-10-CM | POA: Diagnosis not present

## 2016-01-01 DIAGNOSIS — Z923 Personal history of irradiation: Secondary | ICD-10-CM | POA: Insufficient documentation

## 2016-01-01 DIAGNOSIS — T82898A Other specified complication of vascular prosthetic devices, implants and grafts, initial encounter: Secondary | ICD-10-CM

## 2016-01-01 DIAGNOSIS — Z992 Dependence on renal dialysis: Secondary | ICD-10-CM | POA: Insufficient documentation

## 2016-01-01 DIAGNOSIS — Z7982 Long term (current) use of aspirin: Secondary | ICD-10-CM | POA: Insufficient documentation

## 2016-01-01 DIAGNOSIS — F17210 Nicotine dependence, cigarettes, uncomplicated: Secondary | ICD-10-CM | POA: Insufficient documentation

## 2016-01-01 DIAGNOSIS — Z8546 Personal history of malignant neoplasm of prostate: Secondary | ICD-10-CM | POA: Diagnosis not present

## 2016-01-01 DIAGNOSIS — E213 Hyperparathyroidism, unspecified: Secondary | ICD-10-CM | POA: Insufficient documentation

## 2016-01-01 DIAGNOSIS — Z794 Long term (current) use of insulin: Secondary | ICD-10-CM | POA: Diagnosis not present

## 2016-01-01 DIAGNOSIS — E1122 Type 2 diabetes mellitus with diabetic chronic kidney disease: Secondary | ICD-10-CM | POA: Insufficient documentation

## 2016-01-01 DIAGNOSIS — N186 End stage renal disease: Secondary | ICD-10-CM | POA: Diagnosis present

## 2016-01-01 DIAGNOSIS — I120 Hypertensive chronic kidney disease with stage 5 chronic kidney disease or end stage renal disease: Secondary | ICD-10-CM | POA: Insufficient documentation

## 2016-01-01 DIAGNOSIS — Z419 Encounter for procedure for purposes other than remedying health state, unspecified: Secondary | ICD-10-CM

## 2016-01-01 DIAGNOSIS — Y832 Surgical operation with anastomosis, bypass or graft as the cause of abnormal reaction of the patient, or of later complication, without mention of misadventure at the time of the procedure: Secondary | ICD-10-CM | POA: Diagnosis not present

## 2016-01-01 HISTORY — PX: VASCULAR ACCESS DEVICE INSERTION: SHX5158

## 2016-01-01 LAB — POCT I-STAT 4, (NA,K, GLUC, HGB,HCT)
GLUCOSE: 70 mg/dL (ref 65–99)
HCT: 39 % (ref 39.0–52.0)
Hemoglobin: 13.3 g/dL (ref 13.0–17.0)
Potassium: 4.4 mmol/L (ref 3.5–5.1)
Sodium: 140 mmol/L (ref 135–145)

## 2016-01-01 LAB — GLUCOSE, CAPILLARY
GLUCOSE-CAPILLARY: 69 mg/dL (ref 65–99)
GLUCOSE-CAPILLARY: 135 mg/dL — AB (ref 65–99)
Glucose-Capillary: 78 mg/dL (ref 65–99)

## 2016-01-01 SURGERY — INSERTION, CATHETER, HERO
Anesthesia: General | Site: Arm Upper | Laterality: Right

## 2016-01-01 MED ORDER — OXYCODONE-ACETAMINOPHEN 5-325 MG PO TABS
ORAL_TABLET | ORAL | Status: AC
  Filled 2016-01-01: qty 1

## 2016-01-01 MED ORDER — FENTANYL CITRATE (PF) 100 MCG/2ML IJ SOLN: 25.0000 ug | INTRAMUSCULAR | Status: DC | PRN

## 2016-01-01 MED ORDER — LIDOCAINE HCL (CARDIAC) 20 MG/ML IV SOLN
INTRAVENOUS | Status: AC
  Filled 2016-01-01: qty 5

## 2016-01-01 MED ORDER — SODIUM CHLORIDE 0.9 % IV SOLN
INTRAVENOUS | Status: DC | PRN
  Administered 2016-01-01: 09:00:00

## 2016-01-01 MED ORDER — IOPAMIDOL (ISOVUE-300) INJECTION 61%
INTRAVENOUS | Status: DC | PRN
  Administered 2016-01-01: 50 mL

## 2016-01-01 MED ORDER — OXYCODONE-ACETAMINOPHEN 5-325 MG PO TABS
1.0000 | ORAL_TABLET | Freq: Once | ORAL | Status: AC
  Administered 2016-01-01: 1 via ORAL

## 2016-01-01 MED ORDER — PHENYLEPHRINE HCL 10 MG/ML IJ SOLN
INTRAMUSCULAR | Status: DC | PRN
  Administered 2016-01-01 (×2): 160 ug via INTRAVENOUS
  Administered 2016-01-01: 80 ug via INTRAVENOUS
  Administered 2016-01-01 (×2): 200 ug via INTRAVENOUS

## 2016-01-01 MED ORDER — HEMOSTATIC AGENTS (NO CHARGE) OPTIME
TOPICAL | Status: DC | PRN
  Administered 2016-01-01: 1 via TOPICAL

## 2016-01-01 MED ORDER — HEPARIN SODIUM (PORCINE) 1000 UNIT/ML IJ SOLN
INTRAMUSCULAR | Status: AC
  Filled 2016-01-01: qty 1

## 2016-01-01 MED ORDER — SODIUM CHLORIDE 0.9 % IV SOLN
INTRAVENOUS | Status: DC | PRN
  Administered 2016-01-01 (×2): via INTRAVENOUS

## 2016-01-01 MED ORDER — IOPAMIDOL (ISOVUE-300) INJECTION 61%
INTRAVENOUS | Status: AC
  Filled 2016-01-01: qty 50

## 2016-01-01 MED ORDER — PHENYLEPHRINE HCL 10 MG/ML IJ SOLN
10.0000 mg | INTRAVENOUS | Status: DC | PRN
  Administered 2016-01-01: 100 ug/min via INTRAVENOUS

## 2016-01-01 MED ORDER — OXYCODONE-ACETAMINOPHEN 5-325 MG PO TABS: 1.0000 | ORAL_TABLET | Freq: Four times a day (QID) | ORAL | Status: DC | PRN

## 2016-01-01 MED ORDER — HEPARIN SODIUM (PORCINE) 1000 UNIT/ML IJ SOLN
INTRAMUSCULAR | Status: DC | PRN
  Administered 2016-01-01: 5000 [IU] via INTRAVENOUS

## 2016-01-01 MED ORDER — 0.9 % SODIUM CHLORIDE (POUR BTL) OPTIME
TOPICAL | Status: DC | PRN
  Administered 2016-01-01: 1000 mL

## 2016-01-01 MED ORDER — LIDOCAINE HCL (CARDIAC) 20 MG/ML IV SOLN
INTRAVENOUS | Status: DC | PRN
  Administered 2016-01-01: 80 mg via INTRAVENOUS

## 2016-01-01 MED ORDER — ONDANSETRON HCL 4 MG/2ML IJ SOLN
INTRAMUSCULAR | Status: DC | PRN
  Administered 2016-01-01: 4 mg via INTRAVENOUS

## 2016-01-01 MED ORDER — ONDANSETRON HCL 4 MG/2ML IJ SOLN
INTRAMUSCULAR | Status: AC
  Filled 2016-01-01: qty 2

## 2016-01-01 MED ORDER — EPHEDRINE SULFATE 50 MG/ML IJ SOLN
INTRAMUSCULAR | Status: DC | PRN
  Administered 2016-01-01: 5 mg via INTRAVENOUS
  Administered 2016-01-01 (×2): 10 mg via INTRAVENOUS

## 2016-01-01 MED ORDER — PROPOFOL 10 MG/ML IV BOLUS
INTRAVENOUS | Status: DC | PRN
  Administered 2016-01-01: 130 mg via INTRAVENOUS

## 2016-01-01 MED ORDER — PHENYLEPHRINE 40 MCG/ML (10ML) SYRINGE FOR IV PUSH (FOR BLOOD PRESSURE SUPPORT)
PREFILLED_SYRINGE | INTRAVENOUS | Status: AC
  Filled 2016-01-01: qty 10

## 2016-01-01 MED ORDER — ONDANSETRON HCL 4 MG/2ML IJ SOLN: 4.0000 mg | Freq: Once | INTRAMUSCULAR | Status: DC | PRN

## 2016-01-01 MED ORDER — FENTANYL CITRATE (PF) 250 MCG/5ML IJ SOLN
INTRAMUSCULAR | Status: AC
  Filled 2016-01-01: qty 5

## 2016-01-01 MED ORDER — SODIUM CHLORIDE 0.9 % IJ SOLN
INTRAMUSCULAR | Status: AC
  Filled 2016-01-01: qty 10

## 2016-01-01 MED ORDER — FENTANYL CITRATE (PF) 250 MCG/5ML IJ SOLN
INTRAMUSCULAR | Status: DC | PRN
Start: 2016-01-01 — End: 2016-01-01
  Administered 2016-01-01 (×2): 25 ug via INTRAVENOUS

## 2016-01-01 MED ORDER — PROPOFOL 10 MG/ML IV BOLUS
INTRAVENOUS | Status: AC
Start: 2016-01-01 — End: 2016-01-01
  Filled 2016-01-01: qty 20

## 2016-01-01 MED ORDER — SUCCINYLCHOLINE CHLORIDE 20 MG/ML IJ SOLN
INTRAMUSCULAR | Status: AC
  Filled 2016-01-01: qty 1

## 2016-01-01 MED ORDER — EPHEDRINE SULFATE 50 MG/ML IJ SOLN
INTRAMUSCULAR | Status: AC
  Filled 2016-01-01: qty 1

## 2016-01-01 SURGICAL SUPPLY — 47 items
ADAPTER SEAL SUPERHERO SUPPT (Vascular Products) IMPLANT
ADAPTER SUPERHERO SUPP SEAL (Vascular Products) ×1 IMPLANT
ADPR SUPERHERO SUPPT SEAL (Vascular Products) ×3 IMPLANT
AGENT HMST SPONGE THK3/8 (HEMOSTASIS) ×1
ARMBAND PINK RESTRICT EXTREMIT (MISCELLANEOUS) ×3 IMPLANT
BALLN MUSTANG 4X60X75 (BALLOONS) ×3
BALLOON MUSTANG 4X60X75 (BALLOONS) IMPLANT
CANISTER SUCTION 2500CC (MISCELLANEOUS) ×3 IMPLANT
CATH STRAIGHT 5FR 65CM (CATHETERS) ×2 IMPLANT
COMPONENT HERO ACCESSORY KIT (VASCULAR PRODUCTS) IMPLANT
COVER PROBE W GEL 5X96 (DRAPES) ×3 IMPLANT
DRAPE C-ARM 42X72 X-RAY (DRAPES) ×3 IMPLANT
DRAPE CHEST BREAST 15X10 FENES (DRAPES) ×3 IMPLANT
ELECT REM PT RETURN 9FT ADLT (ELECTROSURGICAL) ×3
ELECTRODE REM PT RTRN 9FT ADLT (ELECTROSURGICAL) ×1 IMPLANT
GAUZE SPONGE 4X4 12PLY STRL (GAUZE/BANDAGES/DRESSINGS) ×3 IMPLANT
GLOVE BIO SURGEON STRL SZ 6.5 (GLOVE) ×6 IMPLANT
GLOVE BIO SURGEON STRL SZ7 (GLOVE) ×3 IMPLANT
GLOVE BIO SURGEONS STRL SZ 6.5 (GLOVE) ×6
GLOVE BIOGEL PI IND STRL 6.5 (GLOVE) IMPLANT
GLOVE BIOGEL PI IND STRL 7.5 (GLOVE) ×1 IMPLANT
GLOVE BIOGEL PI INDICATOR 6.5 (GLOVE) ×12
GLOVE BIOGEL PI INDICATOR 7.5 (GLOVE) ×2
GOWN STRL REUS W/ TWL LRG LVL3 (GOWN DISPOSABLE) ×3 IMPLANT
GOWN STRL REUS W/TWL LRG LVL3 (GOWN DISPOSABLE) ×21
GRAFT ACUSEAL GORE 6X40CM (Vascular Products) ×2 IMPLANT
HEMOSTAT SPONGE AVITENE ULTRA (HEMOSTASIS) ×2 IMPLANT
KIT BASIN OR (CUSTOM PROCEDURE TRAY) ×3 IMPLANT
KIT ENCORE 26 ADVANTAGE (KITS) ×2 IMPLANT
KIT ROOM TURNOVER OR (KITS) ×3 IMPLANT
LIQUID BAND (GAUZE/BANDAGES/DRESSINGS) ×5 IMPLANT
NS IRRIG 1000ML POUR BTL (IV SOLUTION) ×3 IMPLANT
PACK CV ACCESS (CUSTOM PROCEDURE TRAY) ×3 IMPLANT
PAD ARMBOARD 7.5X6 YLW CONV (MISCELLANEOUS) ×6 IMPLANT
SHEATH FAST CATH 10F 12CM (SHEATH) ×2 IMPLANT
SPONGE SURGIFOAM ABS GEL 100 (HEMOSTASIS) ×3 IMPLANT
SUT MNCRL AB 4-0 PS2 18 (SUTURE) ×7 IMPLANT
SUT PROLENE 5 0 C 1 24 (SUTURE) ×2 IMPLANT
SUT PROLENE 6 0 BV (SUTURE) ×3 IMPLANT
SUT VIC AB 3-0 SH 27 (SUTURE) ×9
SUT VIC AB 3-0 SH 27X BRD (SUTURE) ×1 IMPLANT
SYR 30ML LL (SYRINGE) IMPLANT
SYR 5ML LL (SYRINGE) ×3 IMPLANT
UNDERPAD 30X30 INCONTINENT (UNDERPADS AND DIAPERS) ×3 IMPLANT
WATER STERILE IRR 1000ML POUR (IV SOLUTION) ×3 IMPLANT
WIRE AMPLATZ SS-J .035X180CM (WIRE) ×4 IMPLANT
WIRE BENTSON .035X145CM (WIRE) ×2 IMPLANT

## 2016-01-01 NOTE — Progress Notes (Signed)
CBG 69-Dr Gifford Shave here and aware-no new orders. Will recheck after pt drinks. Dr Bridgett Larsson here.

## 2016-01-01 NOTE — Progress Notes (Signed)
PA Kim updated )thru [place]): pt still c/o of some numbness in right hand. He sleeps when left alone & appears very comfortable. Doppler right radial pulse, positive bruit & thrill in RUE.  OK to dc pt to home.

## 2016-01-01 NOTE — Op Note (Signed)
OPERATIVE NOTE   PROCEDURE: 1. Venoplasty of right innominate vein and superior vena cava (8 mm x 60 mm) 2. Placement of central venous component (Exchange of tunneled dialysis catheter) 3. Placement of early cannulation right upper arm arteriovenous graft (6 mm Accuseal)  PRE-OPERATIVE DIAGNOSIS: end stage renal disease   POST-OPERATIVE DIAGNOSIS: same as above   SURGEON: Adele Barthel, MD  ASSISTANT(S): Silva Bandy, PAC   ANESTHESIA: general  ESTIMATED BLOOD LOSS: 100 cc  FINDING(S): 1.  Outflow component of HeRO in mid-atrium 2.  Weak thrill in graft 3.  Dopplerable radial signal  SPECIMEN(S):  none  INDICATIONS:   Gerald Davenport is a 75 y.o. male who presents with end stage renal disease and multiple failures with arm access.  His last remainining option for arm access was a HeRO graft catheter.     Risk, benefits, and alternatives to HeRO graft-cath placement were discussed.    The patient is aware the risks include but are not limited to: bleeding, infection, steal syndrome, nerve damage, ischemic monomelic neuropathy, failure to mature, need for additional procedures, death, pneumothorax, and central venous injuries.    The patient agrees to proceed forward with the procedure.   DESCRIPTION: After obtaining full informed written consent, the patient was brought back to the operating room and placed supine upon the operating table.  The patient received IV antibiotics prior to induction.  After obtaining adequate anesthesia, the patient was prepped and draped in the standard fashion for: right arm access procedure and right internal jugular vein tunneled dialysis catheter exchange.  I first turned my attention to the upper arm.  Under Sonosite, there did not appeared to be a good pulse in the brachial artery distally.  The high brachial artery appeared to have better pulsations.  I made a longitudinal incision over the brachial artery and dissected out the brachial artery.   This artery appeared to be soft and 5 mm in diameter.    At this point, I made an incision over the tunneled dialysis catheter and dissected out the tunneled dialysis catheter.  I clamped the catheter.  I dissected out the cuff of the tunneled dialysis catheter.  I transected the catheter and clamped one of the lumen of the catheter.  The backend of the previous tunneled dialysis catheter was passed off the field.  I loaded a Bentson wire through the open lumen of the residual catheter.  Under fluoroscopy, I loaded the wire down into the inferior vena cava.  At this point, I exchanged the residual tunneled dialysis catheter for a 10-Fr sheath.  Using a 5-Fr straight catheter, I exchanged the wire for an Amplatz wire.  I placed a 8 mm x 60 mm balloon and inflated the balloon from the superior vena cava up to distal internal jugular vein by inflating to 10 atm for 1 minute.  I removed the balloon and then exchanged the sheath for a 20-Fr peel-away sheath.  I gave the patient 5000 units of Heparin intravenously to get some anticoagulation.  The HeRO outflow component had already been prepared.  I loaded this component through peel-away sheath, leaving the tip in the mid-atrium.  I clamped the outflow component at the neck.  I made a incision at the deltopectoralis groove and then dissected a small pocket.  I then dissected in the subcutaneous tissue from this incision to the neck incision.  I clamped the outflow component and delivered the loose end of the component through the subcutaneous tunnel.  At this point, I turned my attention to the 6 mm Accuseal graft.  I loaded the HeRO docking component onto the distal end of the graft after predilating the graft.  I then clamped down the collar of the docking component.  I then transected the distal end of the venous outflow component in the deltopectoral incision.  I docked the other end of the docking component onto the venous outflow component in order to connect  the Accuseal graft to the HeRO venous outflow component.  At this point, I had to make two counterincisions to facilitate passage of the graft in a looped upper arm configuration.  Tunneling was made more difficult by the prior healed scars from prior graft removal.  I was able to tunnel the graft with some difficulty, leaving the distal end of the graft in the high brachial artery exposure.  I sharply transected the graft at the appropriate length.  I placed the high brachial artery under tension proximally and distally.  I made an arteriotomy in the high brachial artery and then extended it proximally and distally.  There was obvious atherosclerosis circumferentially in this artery.  I sewed the Accuseal graft to the high brachial artery in an end-to-side configuration with a running stitch of 5-0 Prolene.  Prior to completing this anastomosis, I backbled the artery and the venous outflow.  There was no clot noted from the artery or graft.  I completed this anastomosis in the usual fashion.  After releasing all vessel loops, there was immediately a good pulse in the graft.  I then unclamped the venous outflow component.  The pulsatile graft changed to a faint thrill.  Distally, there was no radial pulse but a dopplerable radial signal.    At this point, all incisions were washout and packed with Avitene.  The segment of graft under the prior scar appeared to be compressed by the scar tissue.  I made a counter incisions and then dissected out this segment, relieveing any scar tissue compressing this segment.  I washed out this segment and packed this incision with Avitene.  After another round of washouts and packing with Avitene, I felt all active bleeding was controlled.  All incisions were repaired with a double layer of 3-0 Vicryl in the subcutaneous tissue and 4-0 Monocryl in the skin.  The skin at all incisions was cleaned, dried, and reinforced with Dermabond.  The exit site was dressed with a sterile  dressing.  COMPLICATIONS: none  CONDITION: stable   Adele Barthel, MD [facility] [place] Office: [phone] Pager: [phone]  01/01/2016, 10:18 AM

## 2016-01-01 NOTE — Anesthesia Preprocedure Evaluation (Addendum)
Anesthesia Evaluation  Patient identified by MRN, date of birth, ID band Patient awake    Reviewed: Allergy & Precautions, NPO status , Patient's Chart, lab work & pertinent test results  History of Anesthesia Complications Negative for: history of anesthetic complications  Airway Mallampati: I  TM Distance: >3 FB Neck ROM: Full    Dental  (+) Edentulous Upper, Edentulous Lower, Dental Advisory Given, Upper Dentures, Lower Dentures   Pulmonary shortness of breath, Current Smoker,    Pulmonary exam normal        Cardiovascular hypertension, Normal cardiovascular exam     Neuro/Psych PSYCHIATRIC DISORDERS Anxiety negative neurological ROS     GI/Hepatic Neg liver ROS, GERD  Medicated,  Endo/Other  diabetes, Type 2, Insulin Dependent  Renal/GU ESRF and DialysisRenal disease (MWF)     Musculoskeletal   Abdominal   Peds  Hematology   Anesthesia Other Findings Prostate cancer s/p radiation  Reproductive/Obstetrics                            Anesthesia Physical Anesthesia Plan  ASA: III  Anesthesia Plan: General   Post-op Pain Management:    Induction: Intravenous  Airway Management Planned: LMA and Simple Face Mask  Additional Equipment:   Intra-op Plan:   Post-operative Plan: Extubation in OR  Informed Consent: I have reviewed the patients History and Physical, chart, labs and discussed the procedure including the risks, benefits and alternatives for the proposed anesthesia with the patient or authorized representative who has indicated his/her understanding and acceptance.   Dental advisory given  Plan Discussed with: CRNA, Anesthesiologist and Surgeon  Anesthesia Plan Comments: (Risks/benefits of general anesthesia discussed with patient including risk of damage to teeth, lips, gum, and tongue, nausea/vomiting, allergic reactions to medications, and the possibility of heart  attack, stroke and death.  All patient questions answered.  Patient wishes to proceed.)        Anesthesia Quick Evaluation

## 2016-01-01 NOTE — Anesthesia Procedure Notes (Signed)
Procedure Name: LMA Insertion Date/Time: 01/01/2016 7:36 AM Performed by: Sampson Si E Pre-anesthesia Checklist: Patient identified, Emergency Drugs available, Suction available, Patient being monitored and Timeout performed Patient Re-evaluated:Patient Re-evaluated prior to inductionOxygen Delivery Method: Circle system utilized Preoxygenation: Pre-oxygenation with 100% oxygen Intubation Type: IV induction LMA: LMA inserted LMA Size: 4.0 Number of attempts: 1 Placement Confirmation: positive ETCO2 and breath sounds checked- equal and bilateral Tube secured with: Tape Dental Injury: Teeth and Oropharynx as per pre-operative assessment

## 2016-01-01 NOTE — Transfer of Care (Signed)
Immediate Anesthesia Transfer of Care Note  Patient: Gerald Davenport  Procedure(s) Performed: Procedure(s): INSERTION OF HERO VASCULAR ACCESS DEVICE - RIGHT (Right)  Patient Location: PACU  Anesthesia Type:General  Level of Consciousness: awake  Airway & Oxygen Therapy: Patient Spontanous Breathing and Patient connected to nasal cannula oxygen  Post-op Assessment: Report given to RN  Post vital signs: Reviewed and stable  Last Vitals:  Filed Vitals:   01/01/16 0619  BP: 199/74  Pulse: 71  Temp: 36.6 C  Resp: 14    Complications: No apparent anesthesia complications

## 2016-01-01 NOTE — Anesthesia Postprocedure Evaluation (Signed)
Anesthesia Post Note  Patient: MAVEN CHALMERS  Procedure(s) Performed: Procedure(s) (LRB): INSERTION OF HERO VASCULAR ACCESS DEVICE - RIGHT (Right)  Patient location during evaluation: PACU Anesthesia Type: General Level of consciousness: awake and alert Pain management: pain level controlled Vital Signs Assessment: post-procedure vital signs reviewed and stable Respiratory status: spontaneous breathing, nonlabored ventilation, respiratory function stable and patient connected to nasal cannula oxygen Cardiovascular status: blood pressure returned to baseline and stable Postop Assessment: no signs of nausea or vomiting Anesthetic complications: no    Last Vitals:  Filed Vitals:   01/01/16 1230 01/01/16 1245  BP: 132/58 142/57  Pulse: 76 78  Temp:  36.3 C  Resp: 15 16    Last Pain:  Filed Vitals:   01/01/16 1251  PainSc: 3                  Catalina Gravel

## 2016-01-01 NOTE — Progress Notes (Signed)
When sat up to get dressed, pt had some oozing from right upper chest Diatek (that was dc'd) area. Gentle pressure held x5 minutes. New gauze dsg applied w/hypofix. Watched x 20 min-no further oozing. Wife here and advised to monitor area. Pt states he has been taking his daily ASA.

## 2016-01-01 NOTE — Progress Notes (Signed)
Report to E. Braddock RN as primary caregiver. 

## 2016-01-01 NOTE — OR Nursing (Signed)
Pt c/o numbness to right hand and soreness. PA Kim notified. Pt is able to tolerate soreness and can feel [place] touching his hand but stated that it was a little numb.

## 2016-01-01 NOTE — H&P (Signed)
Brief History and Physical  History of Present Illness  Gerald Davenport is a 75 y.o. male who presents with chief complaint: ESRD.  The patient presents today for HeRO graft cath placement in R arm.    Past Medical History  Diagnosis Date  . Hypertension   . Hyperlipidemia   . Cancer [facility])     Prostate cancer treated with radiation therapy  . Thyroid disease     hyperparathyroidism  . Anemia   . Positive PPD   . Clot 05/2006    Arterial clot right leg   . Hemodialysis patient ([place]) 6/13  . Diverticulosis   . Anxiety   . Chronic kidney disease     dialysis Mon, Wednes, Fri  . Shortness of breath dyspnea     with exertion, when he has too much fluid  . Constipation   . GERD (gastroesophageal reflux disease)   . Infection     drainage from distal incision of left upper arm arteriovenous graft  . Diabetes mellitus     Type II    Past Surgical History  Procedure Laterality Date  . 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 Av fistula placement  11/04/2010    left and left  . Av fistula placement  11/20/2011    Procedure: INSERTION OF ARTERIOVENOUS (AV) GORE-TEX GRAFT ARM;  Surgeon: Angelia Mould, MD;  Location: [facility] OR;  Service: Vascular;  Laterality: Right;  . Colonoscopy N/A 11/10/2012    [id number] proctitis/Pancolonic diverticulosis/(TUBULAR ADENOMA)5 mm polyp in the transverse segment/4 mm polyps in the base of the cecum and diminutive polyp in the descending segment. INADEQUATE BOWEL PREP  . Colonoscopy N/A 11/28/2013    RMR: Radiation proctitis. Colonic diverticulosis. Colonic polyps-removed as described above.   [name] N/A 10/08/2012    Procedure: [name];  Surgeon: Elam Dutch, MD;  Location: [facility] CATH LAB;  Service: Cardiovascular;  Laterality: N/A;  . Av fistula placement Right 04/19/2015    Procedure: Excision of infected RUA AVG;  Surgeon: Conrad Akutan, MD;  Location: [place];  Service: Vascular;  Laterality: Right;  . Bascilic vein  transposition Left 06/19/2015    Procedure: FIRST STAGE BRACHIAL VEIN TRANSPOSITION;  Surgeon: Conrad Seven Hills, MD;  Location: [place];  Service: Vascular;  Laterality: Left;  . Av fistula placement Left 10/04/2015    Procedure: INSERTION OF ARTERIOVENOUS (AV) GORE-TEX GRAFT ARM;  Surgeon: Conrad Percy, MD;  Location: [place];  Service: Vascular;  Laterality: Left;  . Avgg removal Left 10/13/2015    Procedure: REMOVAL OF INFECTED ARTERIOVENOUS GORETEX LEFT GRAFT ;  Surgeon: Conrad Riverdale, MD;  Location: [place];  Service: Vascular;  Laterality: Left;  . Peripheral vascular catheterization Bilateral 12/06/2015    Procedure: Upper Extremity Venography/ central venogram;  Surgeon: Conrad Laurel, MD;  Location: [place] CV LAB;  Service: Cardiovascular;  Laterality: Bilateral;    Social History   Social History  . Marital Status: Married    Spouse Name: N/A  . Number of Children: N/A  . Years of Education: N/A   Occupational History  . Not on file.   Social History Main Topics  . Smoking status: Light Tobacco Smoker -- 0.25 packs/day for 50 years    Types: Cigarettes  . Smokeless tobacco: Never Used     Comment: 4-5 cigarettes daily  . Alcohol Use: No  . Drug Use: No  . Sexual Activity: Not on file  Other Topics Concern  . Not on file   Social History Narrative    Family History  Problem Relation Age of Onset  . Heart disease Mother   . Diabetes Mother   . Hypertension Mother   . Stroke Mother   . Heart disease Father   . Diabetes Father   . Heart disease Sister   . Diabetes Brother   . Colon cancer Neg Hx     No current facility-administered medications on file prior to encounter.   Current Outpatient Prescriptions on File Prior to Encounter  Medication Sig Dispense Refill  . aspirin EC 81 MG tablet Take 81-162 mg by mouth 2 (two) times daily. Take 2 tablets (162 mg) by mouth every morning and 1 tablet (81 mg) at night    . buPROPion (WELLBUTRIN XL) 150 MG 24 hr tablet Take 150  mg by mouth daily.    . calcium acetate (PHOSLO) 667 MG capsule Take 667-1,334 mg by mouth See admin instructions. Take 2 capsules (1334 mg) by mouth with breakfast and with supper, take 1 capsule (667 mg) with snacks or small meals    . cloNIDine (CATAPRES) 0.2 MG tablet Take 0.2 mg by mouth 3 (three) times daily.     Marland Kitchen gabapentin (NEURONTIN) 100 MG capsule Take 100 mg by mouth 2 (two) times daily.    . insulin detemir (LEVEMIR) 100 UNIT/ML injection Inject 25 Units into the skin at bedtime.     . Linaclotide (LINZESS) 145 MCG CAPS capsule Take 1 capsule (145 mcg total) by mouth daily. Take 30 minutes before breakfast (Patient taking differently: Take 145 mcg by mouth See admin instructions. Take 30 minutes before breakfast on Sunday, Tuesday, Thursday, Saturday (non-dialysis days)) 30 capsule 11  . omeprazole (PRILOSEC) 20 MG capsule Take 20 mg by mouth 2 (two) times daily before a meal.     . Polyvinyl Alcohol-Povidone (REFRESH OP) Place 1 drop into both eyes at bedtime.    Marland Kitchen QUEtiapine (SEROQUEL) 100 MG tablet Take 100 mg by mouth at bedtime.    . saxagliptin HCl (ONGLYZA) 5 MG TABS tablet Take 5 mg by mouth daily with breakfast.     . SENSIPAR 30 MG tablet Take 1 tablet by mouth daily.    . Vancomycin (VANCOCIN) 750 MG/150ML SOLN Inject 150 mLs (750 mg total) into the vein every Monday, Wednesday, and Friday with hemodialysis. 450 mL 0    No Known Allergies  Review of Systems: As listed above, otherwise negative.  Physical Examination  Filed Vitals:   01/01/16 0619  BP: 199/74  Pulse: 71  Temp: 97.8 F (36.6 C)  TempSrc: Oral  Resp: 14  Height: 5\' 9"  (1.753 m)  Weight: 165 lb (74.844 kg)  SpO2: 99%    General: A&O x 3, WDWN  Pulmonary: Sym exp, good air movt, CTAB, no rales, rhonchi, & wheezing  Cardiac: RRR, Nl S1, S2, no Murmurs, rubs or gallops  Gastrointestinal: soft, NTND, -G/R, - HSM, - masses, - CVAT B  Musculoskeletal: M/S 5/5 throughout , Extremities without  ischemic changes   Laboratory See [place] is a 75 y.o. male who presents with: ESRD.   The patient is scheduled for: HeRO placement R arm  Risk, benefits, and alternatives to access surgery were discussed.  The patient is aware the risks include but are not limited to: bleeding, infection, steal syndrome, nerve damage, ischemic monomelic neuropathy, failure to mature, and need for additional procedures.  I  discussed with the patient that his arterial inflow may not be adequate, so will start with exploration of brachial artery.  We discussed also with chronic placement of RIJV TDC, there is increased risk of innominate and superior vena cava injury with placement of the central venous component of the HeRO.  The patient is aware of the risks and agrees to proceed.   Adele Barthel, MD [facility] Office: [phone] Pager: [phone]  01/01/2016, 7:12 AM

## 2016-01-02 ENCOUNTER — Telehealth: Payer: Self-pay

## 2016-01-02 ENCOUNTER — Encounter (HOSPITAL_COMMUNITY): Payer: Self-pay | Admitting: Vascular Surgery

## 2016-01-02 NOTE — Telephone Encounter (Signed)
rec'd phone call from [facility].  Reported pt. Was receiving hemodialysis treatment for approx. 1 hrs. and started c/o pain in the right forearm, and inability to move his fingers.  Also reported the forearm was cooler during this episode.  Stated the pt's right arm graft site had a good thrill, and the pressure readings were good with the 1st hr. Of treatment.  Reported due to the pt's symptoms, treatment was immediately stopped, and our office was notified.  Reported the pt. has reported some easing of symptoms, but not completely resolved.  Stated the pt. is starting to have some ability to move his fingers.  Notified Dr. Kellie Simmering, on call; discussed with Dr. Bridgett Larsson.  Per Dr. Kellie Simmering, since pt's symptoms are improving, plan to have the [place] attempt to dialyze through the right arm HeRO Graft on Friday, to see if the treatment goes better.  If pt's symptoms recur, then will determine of need to ligate the graft at that time.  Notified Mickel Baas, RN @ [facility]; notified pt. of plan.  Both in agreement.  Suggested that pt. Not eat breakfast on Fri. AM, in case he would need to go to the OR.  Verb. Understanding.

## 2016-01-03 ENCOUNTER — Telehealth: Payer: Self-pay | Admitting: Vascular Surgery

## 2016-01-03 NOTE — Telephone Encounter (Signed)
-----   Message from Mena Goes, RN sent at 01/01/2016 12:55 PM EDT ----- Regarding: RE: schedule We will contact [place]. Thanks ----- Message -----    From: Georgiann Mccoy    Sent: 01/01/2016  11:36 AM      To: Mena Goes, RN Subject: RE: schedule                                   Do we have a specific [employer] rep that we call or do we have a card?   ----- Message -----    From: Mena Goes, RN    Sent: 01/01/2016  11:17 AM      To: [facility] Admin Pool Subject: schedule                                         ----- Message -----    From: Alvia Grove, PA-C    Sent: 01/01/2016  10:38 AM      To: [facility] Charge Pool  S/p right Hero graft with Acuseal graft 01/01/16  Please contact [employer] rep who will need to discuss cannulation of this graft with dialysis tech at dialysis center.   Thanks [name]

## 2016-01-04 ENCOUNTER — Telehealth: Payer: Self-pay | Admitting: *Deleted

## 2016-01-04 NOTE — Telephone Encounter (Signed)
Kendall Flack, nurse at [facility] called again today regarding Gerald Davenport's HeRO graft. This was place on 01-01-16 by Dr. Bridgett Larsson. The [facility] had called and talked to [place] on Wednesday because of the patient's complaints of hand numbness and pain. Per Gerald Davenport, at today's session, the patient could not stand the pain and so his treatment was discontinued . Patient c/o increased pain in the hand,and inability to move his wrist at times. Per the nurse, the arm and hand are not swollen and there is a pulse noted. Gerald Davenport states that he cannulated the patient today and the AVG was functioning well. Patient is afebrile and shows no S&S of infection at this time.   I discussed this with Dr. Oneida Alar as Dr. Bridgett Larsson is out today, and he told me to relay the following options to the patient: 1. Send patient to ED for evaluation and ligation of graft by Dr.Dickson (On call).  2. If patient thinks that his pain and numbness are tolerable, he can see a doctor in the office next week to discuss any options.   Gerald Davenport will discuss with patient and let me know asap what he wants to do. He verbalized understanding and agreement with this plan.

## 2016-01-18 ENCOUNTER — Encounter (HOSPITAL_COMMUNITY): Payer: Self-pay | Admitting: *Deleted

## 2016-01-18 ENCOUNTER — Ambulatory Visit (INDEPENDENT_AMBULATORY_CARE_PROVIDER_SITE_OTHER): Payer: Medicare Other | Admitting: Vascular Surgery

## 2016-01-18 ENCOUNTER — Other Ambulatory Visit: Payer: Self-pay

## 2016-01-18 ENCOUNTER — Emergency Department (HOSPITAL_COMMUNITY)
Admission: EM | Admit: 2016-01-18 | Discharge: 2016-01-18 | Disposition: A | Discharge status: Home / Self Care | Payer: Medicare Other | Attending: Emergency Medicine | Admitting: Emergency Medicine

## 2016-01-18 ENCOUNTER — Emergency Department (HOSPITAL_COMMUNITY): Payer: Medicare Other

## 2016-01-18 ENCOUNTER — Encounter: Payer: Self-pay | Admitting: Vascular Surgery

## 2016-01-18 ENCOUNTER — Encounter (HOSPITAL_COMMUNITY): Payer: Self-pay

## 2016-01-18 VITALS — BP 206/85 | HR 108 | Temp 99.0°F | Resp 14 | Ht 69.0 in | Wt 157.0 lb

## 2016-01-18 DIAGNOSIS — E1122 Type 2 diabetes mellitus with diabetic chronic kidney disease: Secondary | ICD-10-CM | POA: Insufficient documentation

## 2016-01-18 DIAGNOSIS — M79641 Pain in right hand: Secondary | ICD-10-CM | POA: Diagnosis present

## 2016-01-18 DIAGNOSIS — Z8546 Personal history of malignant neoplasm of prostate: Secondary | ICD-10-CM | POA: Insufficient documentation

## 2016-01-18 DIAGNOSIS — E785 Hyperlipidemia, unspecified: Secondary | ICD-10-CM | POA: Insufficient documentation

## 2016-01-18 DIAGNOSIS — F17210 Nicotine dependence, cigarettes, uncomplicated: Secondary | ICD-10-CM | POA: Insufficient documentation

## 2016-01-18 DIAGNOSIS — Z992 Dependence on renal dialysis: Secondary | ICD-10-CM | POA: Insufficient documentation

## 2016-01-18 DIAGNOSIS — I129 Hypertensive chronic kidney disease with stage 1 through stage 4 chronic kidney disease, or unspecified chronic kidney disease: Secondary | ICD-10-CM | POA: Insufficient documentation

## 2016-01-18 DIAGNOSIS — N189 Chronic kidney disease, unspecified: Secondary | ICD-10-CM | POA: Insufficient documentation

## 2016-01-18 DIAGNOSIS — N186 End stage renal disease: Secondary | ICD-10-CM

## 2016-01-18 DIAGNOSIS — Z794 Long term (current) use of insulin: Secondary | ICD-10-CM | POA: Diagnosis not present

## 2016-01-18 DIAGNOSIS — Z79899 Other long term (current) drug therapy: Secondary | ICD-10-CM | POA: Insufficient documentation

## 2016-01-18 DIAGNOSIS — T82898A Other specified complication of vascular prosthetic devices, implants and grafts, initial encounter: Secondary | ICD-10-CM

## 2016-01-18 MED ORDER — OXYCODONE-ACETAMINOPHEN 5-325 MG PO TABS: 1.0000 | ORAL_TABLET | Freq: Four times a day (QID) | ORAL | Status: DC | PRN

## 2016-01-18 MED ORDER — DEXTROSE 5 % IV SOLN
1.5000 g | INTRAVENOUS | Status: AC
  Administered 2016-01-19: 1.5 g via INTRAVENOUS
  Filled 2016-01-18: qty 1.5

## 2016-01-18 MED ORDER — SODIUM CHLORIDE 0.9 % IV SOLN
INTRAVENOUS | Status: DC
  Administered 2016-01-19: 07:00:00 via INTRAVENOUS

## 2016-01-18 NOTE — ED Notes (Signed)
Dr Ward in to assess 

## 2016-01-18 NOTE — ED Notes (Signed)
Pt had a VAD placed to right upper arm the first week of April, states since then he has had pain to arm that is shooting down into his hand.  Pt states an attempt to use the graft was done on Wednesday but apparently was not successful per patient.  Pt states he was taking Percocet for same and ran out on Wednesday.

## 2016-01-18 NOTE — ED Notes (Signed)
Pt with Fistula to R- graft has a weak thrill with redness to the lower portion. Pt hand is reddened and he cannot/willnot move fingers

## 2016-01-18 NOTE — Progress Notes (Signed)
Pt denies SOB, chest pain, and being under the care of a cardiologist. Pt denies having a cardiac cath. Pt stated that MD advised that he take only " 50% of my insulin tonight;" Pt will take 12 units instead of 25. Pt requested and consented to spouse, [place], taking pre-op instructions. Spouse made aware of diabetic protocol to check BS in AM, interventions for a BS <70 and >220 and phone # to SS. Spouse made aware that pt not take diabetes medications the morning of surgery such as ONGLYZA.  Spouse verbalized understanding of all pre-op instructions

## 2016-01-18 NOTE — ED Provider Notes (Addendum)
TIME SEEN: 4:50 AM  CHIEF COMPLAINT: Right hand pain  HPI: Pt is a 75 y.o. right-hand-dominant male with history of hypertension, diabetes hyperlipidemia, peripheral vascular disease, end-stage renal disease on hemodialysis Monday, Wednesday and Friday he was last dialyzed on April 20 who presents emergency department with complaints of right hand pain. He has had pain in his hand for several months. Had a right AV fistula graft placed on 01/01/2016 by Dr. Bridgett Larsson. Reports that he has had pain in his hand prior to this fistula being placed. States that the pain is over the MCPs and fingers. No injury to the hand. No redness or warmth. No numbness or focal weakness. Does report that he was able to get dialysis through this fistula but had to stop slightly early 2 days ago because of pain. States he received 3 hours of dialysis. Denies any chest pain or shortness of breath. No leg swelling. Has dialysis scheduled today at 5:30 PM. Also reports he has an appointment with vascular surgery scheduled at 1:30 PM today. Patient states the reason he is here in the emergency department is because he ran out of his pain medication. States he needs something for pain. Denies any neck pain.   ROS: See HPI Constitutional: no fever  Eyes: no drainage  ENT: no runny nose   Cardiovascular:  no chest pain  Resp: no SOB  GI: no vomiting GU: no dysuria Integumentary: no rash  Allergy: no hives  Musculoskeletal: no leg swelling  Neurological: no slurred speech ROS otherwise negative  PAST MEDICAL HISTORY/PAST SURGICAL HISTORY:  Past Medical History  Diagnosis Date  . Hypertension   . Hyperlipidemia   . Cancer [facility])     Prostate cancer treated with radiation therapy  . Thyroid disease     hyperparathyroidism  . Anemia   . Positive PPD   . Clot 05/2006    Arterial clot right leg   . Hemodialysis patient ([place]) 6/13  . Diverticulosis   . Anxiety   . Chronic kidney disease     dialysis Mon, Wednes, Fri  .  Shortness of breath dyspnea     with exertion, when he has too much fluid  . Constipation   . GERD (gastroesophageal reflux disease)   . Infection     drainage from distal incision of left upper arm arteriovenous graft  . Diabetes mellitus     Type II    MEDICATIONS:  Prior to Admission medications   Medication Sig Start Date End Date Taking? Authorizing Provider  aspirin EC 81 MG tablet Take 81-162 mg by mouth 2 (two) times daily. Take 2 tablets (162 mg) by mouth every morning and 1 tablet (81 mg) at night    Historical Provider, MD  buPROPion (WELLBUTRIN XL) 150 MG 24 hr tablet Take 150 mg by mouth daily. 03/05/15   Historical Provider, MD  calcium acetate (PHOSLO) 667 MG capsule Take 667-1,334 mg by mouth See admin instructions. Take 2 capsules (1334 mg) by mouth with breakfast and with supper, take 1 capsule (667 mg) with snacks or small meals 10/26/12   Historical Provider, MD  cloNIDine (CATAPRES) 0.2 MG tablet Take 0.2 mg by mouth 3 (three) times daily.     Historical Provider, MD  doxazosin (CARDURA) 8 MG tablet Take 8 mg by mouth daily.    Historical Provider, MD  furosemide (LASIX) 40 MG tablet Take 40 mg by mouth daily.    Historical Provider, MD  gabapentin (NEURONTIN) 100 MG capsule Take 100 mg  by mouth 2 (two) times daily. 04/13/15   Historical Provider, MD  insulin detemir (LEVEMIR) 100 UNIT/ML injection Inject 25 Units into the skin at bedtime.     Historical Provider, MD  Linaclotide Rolan Lipa) 145 MCG CAPS capsule Take 1 capsule (145 mcg total) by mouth daily. Take 30 minutes before breakfast Patient taking differently: Take 145 mcg by mouth See admin instructions. Take 30 minutes before breakfast on Sunday, Tuesday, Thursday, Saturday (non-dialysis days) 02/06/15   Carlis Stable, NP  omeprazole (PRILOSEC) 20 MG capsule Take 20 mg by mouth 2 (two) times daily before a meal.  02/20/15   Historical Provider, MD  oxyCODONE-acetaminophen (ROXICET) 5-325 MG tablet Take 1 tablet by mouth  every 6 (six) hours as needed. 01/01/16   Alvia Grove, PA-C  Polyvinyl Alcohol-Povidone (REFRESH OP) Place 1 drop into both eyes at bedtime.    Historical Provider, MD  QUEtiapine (SEROQUEL) 100 MG tablet Take 100 mg by mouth at bedtime. 03/05/15   Historical Provider, MD  saxagliptin HCl (ONGLYZA) 5 MG TABS tablet Take 5 mg by mouth daily with breakfast.     Historical Provider, MD  SENSIPAR 30 MG tablet Take 1 tablet by mouth daily. 11/20/15   Historical Provider, MD  simvastatin (ZOCOR) 20 MG tablet Take 20 mg by mouth daily.    Historical Provider, MD  Vancomycin (VANCOCIN) 750 MG/150ML SOLN Inject 150 mLs (750 mg total) into the vein every Monday, Wednesday, and Friday with hemodialysis. 10/17/15   Alvia Grove, PA-C    ALLERGIES:  No Known Allergies  SOCIAL HISTORY:  Social History  Substance Use Topics  . Smoking status: Light Tobacco Smoker -- 0.25 packs/day for 50 years    Types: Cigarettes  . Smokeless tobacco: Never Used     Comment: 4-5 cigarettes daily  . Alcohol Use: No    FAMILY HISTORY: Family History  Problem Relation Age of Onset  . Heart disease Mother   . Diabetes Mother   . Hypertension Mother   . Stroke Mother   . Heart disease Father   . Diabetes Father   . Heart disease Sister   . Diabetes Brother   . Colon cancer Neg Hx     EXAM: BP 203/74 mmHg  Pulse 110  Temp(Src) 98.4 F (36.9 C) (Oral)  Resp 22  Ht 5\' 9"  (1.753 m)  Wt 180 lb (81.647 kg)  BMI 26.57 kg/m2  SpO2 97% CONSTITUTIONAL: Alert and oriented and responds appropriately to questions. Elderly, chronically ill-appearing, in no significant distress, appears drowsy but is arousable with voice HEAD: Normocephalic EYES: Conjunctivae clear, PERRL ENT: normal nose; no rhinorrhea; moist mucous membranes NECK: Supple, no meningismus, no LAD, no midline spinal tenderness or step-off or deformity  CARD: Regular and tachycardic; S1 and S2 appreciated; no murmurs, no clicks, no rubs, no  gallops RESP: Normal chest excursion without splinting or tachypnea; breath sounds clear and equal bilaterally; no wheezes, no rhonchi, no rales, no hypoxia or respiratory distress, speaking full sentences ABD/GI: Normal bowel sounds; non-distended; soft, non-tender, no rebound, no guarding, no peritoneal signs BACK:  The back appears normal and is non-tender to palpation, there is no CVA tenderness EXT: Patient has an AV fistula in the right upper arm. On my examination there is a strong thrill and bruit and he has no significant tenderness over this area. There is no bleeding. There is no redness or warmth. No induration. He is tender to palpation over the right MCPs and right fingers without obvious  deformity. He does have some arthritic changes present. No ecchymosis. No erythema or warmth or hand. No joint effusion. Normal grip strength bilaterally. I am able to Doppler a strong radial and ulnar pulse on the right side. His hand is warm and well-perfused. He reports normal sensation throughout this right arm. Compartments are all soft. 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 Grooming and personal hygiene are appropriate.  MEDICAL DECISION MAKING: Patient here complains of right hand pain that he states has been present for several months and unchanged. Despite nursing notes, there is no redness or warmth appreciated on exam. He has a strong thrill and bruit in his fistula and minimal tenderness in this area. There is no sign of superimposed infection anywhere throughout the right arm. No sign of septic  arthritis or gout. He is neurovascular intact distally and has pulses that I can Doppler easily. Extremities warm and well-perfused and compartments are all soft. Has appointment with his vascular surgeon as well as with dialysis today. He is minimally hypertensive but on recheck blood pressure is 180s/70s. Denies any other complaint. I do not feel he needs significant emergent workup but we will obtain an x-ray of this hand. Nothing right now to suggest a DVT. There is no swelling in the right arm is different from the left. Nothing to suggest arterial clot. There again is nothing that suggest infection. His AV fistula appears normal.  ED PROGRESS: 5:50 AM  Pt's x-ray shows no acute injury or other bony abnormality. I have reexamined his hand. He is very tender to palpation with just light palpation over the dorsal aspect of his MCPs and second through fifth fingers. He may have some very mild swelling on the right side compared to the left in the fingers only. He does not have any erythema or warmth that I appreciate. States he has never had gout before. He denies immediate the pain is shooting down his arm despite nursing notes. I doubt that this is radiculopathy. He points to his hand as the source of pain. States he does have intermittent discomfort in the fistula but does not have any currently. I feel he is safe to go home. He has outpatient follow-up scheduled. We will discharge him with Percocet. He is comfortable with this plan. Discussed at length return precautions.   At this time, I do not feel there is any life-threatening condition present. I have reviewed and discussed all results (EKG, imaging, lab, urine as appropriate), exam findings with patient. I have reviewed nursing notes and appropriate previous records.  I feel the patient is safe to be discharged home without further emergent workup. Discussed usual and customary return precautions. Patient and family (if present) verbalize  understanding and are comfortable with this plan.  Patient will follow-up with their primary care provider. If they do not have a primary care provider, information for follow-up has been provided to them. All questions have been answered.      At this time, I do not feel there is any life-threatening condition present. I have reviewed and discussed all results (EKG, imaging, lab, urine as  appropriate), exam findings with patient. I have reviewed nursing notes and appropriate previous records.  I feel the patient is safe to be discharged home without further emergent workup. Discussed usual and customary return precautions. Patient and family (if present) verbalize understanding and are comfortable with this plan.  Patient will follow-up with their primary care provider. If they do not have a primary care provider, information for follow-up has been provided to them. All questions have been answered.    [place], DO 01/18/16 [place] Taseen Marasigan, DO 01/18/16 [id number]

## 2016-01-18 NOTE — Discharge Instructions (Signed)
You were seen in the emergency department for pain in your right hand that you have had for several months. Your graft appears normal today and has no sign of infection. There is no sign of any fracture on x-ray of your hand today. Nothing right now to suggest that you have an infection in any of your joints called septic arthritis, cellulitis which is infection of the soft tissues, or gout. You have good pulses and no significant swelling noted in this hand. I recommend you follow-up with your vascular surgeon as scheduled today at 1:30 PM and that you go to your dialysis appointment today as scheduled at 5:30 PM. Please follow-up with your primary care physician for further pain management.

## 2016-01-18 NOTE — Progress Notes (Signed)
Postoperative Access Visit   History of Present Illness  Gerald Davenport is a 75 y.o. year old male who presents for postoperative follow-up for: venoplasty R innominate vein and SVC, HeRO placement with Accuseal (Date: 01/01/16).  The patient initially had steal sx with start of use of the HeRO graft.  This patient previously had significant steal issues due to poor inflow to the L arm requiring removal of the LUA AVG.  Reportedly his right hand sx have worsened though he continued to use the R UA HeRO for HD.  The patient's wounds are healing.  The patient notes severe steal symptoms.  The patient is not able to complete their activities of daily living.  The patient's current symptoms are: severe pain in right arm extending to hand.  Past Medical History  Diagnosis Date  . Hypertension   . Hyperlipidemia   . Cancer [facility])     Prostate cancer treated with radiation therapy  . Thyroid disease     hyperparathyroidism  . Anemia   . Positive PPD   . Clot 05/2006    Arterial clot right leg   . Hemodialysis patient ([place]) 6/13  . Diverticulosis   . Anxiety   . Chronic kidney disease     dialysis Mon, Wednes, Fri  . Shortness of breath dyspnea     with exertion, when he has too much fluid  . Constipation   . GERD (gastroesophageal reflux disease)   . Infection     drainage from distal incision of left upper arm arteriovenous graft  . Diabetes mellitus     Type II    Past Surgical History  Procedure Laterality Date  . 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 Av fistula placement  11/04/2010    left and left  . Av fistula placement  11/20/2011    Procedure: INSERTION OF ARTERIOVENOUS (AV) GORE-TEX GRAFT ARM;  Surgeon: Angelia Mould, MD;  [facility];  Service: Vascular;  Laterality: Right;  . Colonoscopy N/A 11/10/2012    [id number] proctitis/Pancolonic diverticulosis/(TUBULAR ADENOMA)5 mm polyp in the transverse segment/4 mm polyps in the base of the cecum and  diminutive polyp in the descending segment. INADEQUATE BOWEL PREP  . Colonoscopy N/A 11/28/2013    RMR: Radiation proctitis. Colonic diverticulosis. Colonic polyps-removed as described above.   [name] N/A 10/08/2012    Procedure: [name];  Surgeon: Elam Dutch, MD;  Location: [facility] CATH LAB;  Service: Cardiovascular;  Laterality: N/A;  . Av fistula placement Right 04/19/2015    Procedure: Excision of infected RUA AVG;  Surgeon: Conrad [place], MD;  Location: [place];  Service: Vascular;  Laterality: Right;  . Bascilic vein transposition Left 06/19/2015    Procedure: FIRST STAGE BRACHIAL VEIN TRANSPOSITION;  Surgeon: Conrad [place], MD;  Location: [place];  Service: Vascular;  Laterality: Left;  . Av fistula placement Left 10/04/2015    Procedure: INSERTION OF ARTERIOVENOUS (AV) GORE-TEX GRAFT ARM;  Surgeon: Conrad [place], MD;  Location: [place];  Service: Vascular;  Laterality: Left;  . Avgg removal Left 10/13/2015    Procedure: REMOVAL OF INFECTED ARTERIOVENOUS GORETEX LEFT GRAFT ;  Surgeon: Conrad [place], MD;  Location: [place];  Service: Vascular;  Laterality: Left;  . Peripheral vascular catheterization Bilateral 12/06/2015    Procedure: Upper Extremity Venography/ central venogram;  Surgeon: Conrad St. , MD;  Location: [place] CV LAB;  Service: Cardiovascular;  Laterality: Bilateral;  . Vascular access  device insertion Right 01/01/2016    Procedure: INSERTION OF HERO VASCULAR ACCESS DEVICE - RIGHT;  Surgeon: Conrad [place], MD;  Location: [place];  Service: Vascular;  Laterality: Right;    Social History   Social History  . Marital Status: Married    Spouse Name: N/A  . Number of Children: N/A  . Years of Education: N/A   Occupational History  . Not on file.   Social History Main Topics  . Smoking status: Light Tobacco Smoker -- 0.25 packs/day for 50 years    Types: Cigarettes  . Smokeless tobacco: Never Used     Comment: 4-5 cigarettes daily  . Alcohol Use: No  . Drug Use: No  . Sexual  Activity: Not on file   Other Topics Concern  . Not on file   Social History Narrative    Family History  Problem Relation Age of Onset  . Heart disease Mother   . Diabetes Mother   . Hypertension Mother   . Stroke Mother   . Heart disease Father   . Diabetes Father   . Heart disease Sister   . Diabetes Brother   . Colon cancer Neg Hx     Current Outpatient Prescriptions  Medication Sig Dispense Refill  . aspirin EC 81 MG tablet Take 81-162 mg by mouth 2 (two) times daily. Take 2 tablets (162 mg) by mouth every morning and 1 tablet (81 mg) at night    . buPROPion (WELLBUTRIN XL) 150 MG 24 hr tablet Take 150 mg by mouth daily.    . calcium acetate (PHOSLO) 667 MG capsule Take 667-1,334 mg by mouth See admin instructions. Take 2 capsules (1334 mg) by mouth with breakfast and with supper, take 1 capsule (667 mg) with snacks or small meals    . cloNIDine (CATAPRES) 0.2 MG tablet Take 0.2 mg by mouth 3 (three) times daily.     Marland Kitchen doxazosin (CARDURA) 8 MG tablet Take 8 mg by mouth daily.    . furosemide (LASIX) 40 MG tablet Take 40 mg by mouth daily.    Marland Kitchen gabapentin (NEURONTIN) 100 MG capsule Take 100 mg by mouth 2 (two) times daily.    . insulin detemir (LEVEMIR) 100 UNIT/ML injection Inject 25 Units into the skin at bedtime.     . Linaclotide (LINZESS) 145 MCG CAPS capsule Take 1 capsule (145 mcg total) by mouth daily. Take 30 minutes before breakfast (Patient taking differently: Take 145 mcg by mouth See admin instructions. Take 30 minutes before breakfast on Sunday, Tuesday, Thursday, Saturday (non-dialysis days)) 30 capsule 11  . omeprazole (PRILOSEC) 20 MG capsule Take 20 mg by mouth 2 (two) times daily before a meal.     . oxyCODONE-acetaminophen (PERCOCET/ROXICET) 5-325 MG tablet Take 1 tablet by mouth every 6 (six) hours as needed. 20 tablet 0  . oxyCODONE-acetaminophen (PERCOCET/ROXICET) 5-325 MG tablet Take 1 tablet by mouth every 6 (six) hours as needed. 6 tablet 0  .  Polyvinyl Alcohol-Povidone (REFRESH OP) Place 1 drop into both eyes at bedtime.    Marland Kitchen QUEtiapine (SEROQUEL) 100 MG tablet Take 100 mg by mouth at bedtime.    . saxagliptin HCl (ONGLYZA) 5 MG TABS tablet Take 5 mg by mouth daily with breakfast.     . SENSIPAR 30 MG tablet Take 1 tablet by mouth daily.    . simvastatin (ZOCOR) 20 MG tablet Take 20 mg by mouth daily.    . Vancomycin (VANCOCIN) 750 MG/150ML SOLN Inject 150 mLs (750 mg  total) into the vein every Monday, Wednesday, and Friday with hemodialysis. 450 mL 0   No current facility-administered medications for this visit.     No Known Allergies   REVIEW OF SYSTEMS:  (Positives checked otherwise negative)  CARDIOVASCULAR:   [ ]  chest pain,  [ ]  chest pressure,  [ ]  palpitations,  [ ]  shortness of breath when laying flat,  [ ]  shortness of breath with exertion,   [ ]  pain in feet when walking,  [ ]  pain in feet when laying flat, [ ]  history of blood clot in veins (DVT),  [ ]  history of phlebitis,  [ ]  swelling in legs,  [ ]  varicose veins  PULMONARY:   [ ]  productive cough,  [ ]  asthma,  [ ]  wheezing  NEUROLOGIC:   [ ]  weakness in arms or legs,  [ ]  numbness in arms or legs,  [ ]  difficulty speaking or slurred speech,  [ ]  temporary loss of vision in one eye,  [ ]  dizziness  HEMATOLOGIC:   [ ]  bleeding problems,  [ ]  problems with blood clotting too easily  MUSCULOSKEL:   [ ]  joint pain, [ ]  joint swelling  GASTROINTEST:   [ ]  vomiting blood,  [ ]  blood in stool     GENITOURINARY:   [ ]  burning with urination,  [ ]  blood in urine [x]  ESRD-HD: M-W-F  PSYCHIATRIC:   [ ]  history of major depression  INTEGUMENTARY:   [ ]  rashes,  [ ]  ulcers  CONSTITUTIONAL:   [ ]  fever,  [ ]  chills   For VQI Use Only  PRE-ADM LIVING: Home  AMB STATUS: Ambulatory  Physical Examination Filed Vitals:   01/18/16 1302  BP: 206/85  Pulse: 108  Temp: 99 F (37.2 C)  Resp: 14   Pulmonary: Sym exp, good air  movt, CTAB, no rales, rhonchi, & wheezing  Cardiac: RRR, Nl S1, S2, no Murmurs, rubs or gallops  RUE: Incisions are c/d/i, some hematoma palpable at cannulation sites, skin feels cool in hand, hand grip is 1/5, sensation in digits is decreased, bruit can be auscultated   Medical Decision Making  Gerald Davenport is a 75 y.o. year old male who presents s/p venoplasty R innominate vein and SVC, HeRO placement with Accuseal complicated with severe steal   Given severity of sx, would favor removing the AVG and replacing the [facility]. The patient is aware the risks of tunneled dialysis catheter placement include but are not limited to: bleeding, infection, central venous injury, pneumothorax, possible venous stenosis, possible malpositioning in the venous system, and possible infections related to long-term catheter presence.  The patient was aware of these risks and agreed to proceed. Will try to get this patient scheduled tomorrow with Dr. Oneida Alar.  Thank you for allowing [place] to participate in this patient's care.  Adele Barthel, MD [facility] Office: [phone] Pager: [phone]  01/18/2016, 8:32 AM

## 2016-01-19 ENCOUNTER — Encounter (HOSPITAL_COMMUNITY)
Admission: RE | Disposition: A | Discharge status: Home / Self Care | Payer: Self-pay | Source: Ambulatory Visit | Attending: Vascular Surgery

## 2016-01-19 ENCOUNTER — Ambulatory Visit (HOSPITAL_COMMUNITY): Payer: Medicare Other

## 2016-01-19 ENCOUNTER — Encounter (HOSPITAL_COMMUNITY): Payer: Self-pay | Admitting: *Deleted

## 2016-01-19 ENCOUNTER — Encounter (HOSPITAL_COMMUNITY): Payer: Self-pay | Admitting: Emergency Medicine

## 2016-01-19 ENCOUNTER — Ambulatory Visit (HOSPITAL_COMMUNITY)
Admission: RE | Admit: 2016-01-19 | Discharge: 2016-01-19 | Disposition: A | Discharge status: Home / Self Care | Payer: Medicare Other | Source: Ambulatory Visit | Attending: Vascular Surgery | Admitting: Vascular Surgery

## 2016-01-19 ENCOUNTER — Emergency Department (HOSPITAL_COMMUNITY)
Admission: EM | Admit: 2016-01-19 | Discharge: 2016-01-19 | Disposition: A | Discharge status: Home / Self Care | Payer: Medicare Other | Source: Home / Self Care | Attending: Emergency Medicine | Admitting: Emergency Medicine

## 2016-01-19 ENCOUNTER — Ambulatory Visit (HOSPITAL_COMMUNITY): Payer: Medicare Other | Admitting: Anesthesiology

## 2016-01-19 DIAGNOSIS — Z79899 Other long term (current) drug therapy: Secondary | ICD-10-CM

## 2016-01-19 DIAGNOSIS — Y838 Other surgical procedures as the cause of abnormal reaction of the patient, or of later complication, without mention of misadventure at the time of the procedure: Secondary | ICD-10-CM | POA: Diagnosis not present

## 2016-01-19 DIAGNOSIS — I10 Essential (primary) hypertension: Secondary | ICD-10-CM

## 2016-01-19 DIAGNOSIS — Z8546 Personal history of malignant neoplasm of prostate: Secondary | ICD-10-CM | POA: Diagnosis not present

## 2016-01-19 DIAGNOSIS — Z7982 Long term (current) use of aspirin: Secondary | ICD-10-CM | POA: Insufficient documentation

## 2016-01-19 DIAGNOSIS — N186 End stage renal disease: Secondary | ICD-10-CM

## 2016-01-19 DIAGNOSIS — R0602 Shortness of breath: Secondary | ICD-10-CM | POA: Diagnosis not present

## 2016-01-19 DIAGNOSIS — T82898A Other specified complication of vascular prosthetic devices, implants and grafts, initial encounter: Secondary | ICD-10-CM | POA: Diagnosis not present

## 2016-01-19 DIAGNOSIS — Z89511 Acquired absence of right leg below knee: Secondary | ICD-10-CM | POA: Insufficient documentation

## 2016-01-19 DIAGNOSIS — E785 Hyperlipidemia, unspecified: Secondary | ICD-10-CM | POA: Diagnosis not present

## 2016-01-19 DIAGNOSIS — Z452 Encounter for adjustment and management of vascular access device: Secondary | ICD-10-CM

## 2016-01-19 DIAGNOSIS — F17210 Nicotine dependence, cigarettes, uncomplicated: Secondary | ICD-10-CM | POA: Insufficient documentation

## 2016-01-19 DIAGNOSIS — F419 Anxiety disorder, unspecified: Secondary | ICD-10-CM | POA: Insufficient documentation

## 2016-01-19 DIAGNOSIS — N189 Chronic kidney disease, unspecified: Secondary | ICD-10-CM

## 2016-01-19 DIAGNOSIS — E1122 Type 2 diabetes mellitus with diabetic chronic kidney disease: Secondary | ICD-10-CM | POA: Diagnosis not present

## 2016-01-19 DIAGNOSIS — Z923 Personal history of irradiation: Secondary | ICD-10-CM | POA: Diagnosis not present

## 2016-01-19 DIAGNOSIS — Z794 Long term (current) use of insulin: Secondary | ICD-10-CM | POA: Insufficient documentation

## 2016-01-19 DIAGNOSIS — I120 Hypertensive chronic kidney disease with stage 5 chronic kidney disease or end stage renal disease: Secondary | ICD-10-CM | POA: Insufficient documentation

## 2016-01-19 DIAGNOSIS — Z992 Dependence on renal dialysis: Secondary | ICD-10-CM | POA: Insufficient documentation

## 2016-01-19 DIAGNOSIS — Z419 Encounter for procedure for purposes other than remedying health state, unspecified: Secondary | ICD-10-CM

## 2016-01-19 HISTORY — DX: Other specified complication of vascular prosthetic devices, implants and grafts, initial encounter: T82.898A

## 2016-01-19 HISTORY — DX: Presence of spectacles and contact lenses: Z97.3

## 2016-01-19 HISTORY — PX: INSERTION OF DIALYSIS CATHETER: SHX1324

## 2016-01-19 HISTORY — PX: REMOVAL OF A HERO DEVICE: SHX6054

## 2016-01-19 LAB — CBC WITH DIFFERENTIAL/PLATELET
BASOS ABS: 0.1 10*3/uL (ref 0.0–0.1)
Basophils Relative: 0 %
EOS PCT: 1 %
Eosinophils Absolute: 0.1 10*3/uL (ref 0.0–0.7)
HCT: 33.7 % — ABNORMAL LOW (ref 39.0–52.0)
Hemoglobin: 11.1 g/dL — ABNORMAL LOW (ref 13.0–17.0)
LYMPHS PCT: 17 %
Lymphs Abs: 2 10*3/uL (ref 0.7–4.0)
MCH: 35.7 pg — ABNORMAL HIGH (ref 26.0–34.0)
MCHC: 32.9 g/dL (ref 30.0–36.0)
MCV: 108.4 fL — AB (ref 78.0–100.0)
MONO ABS: 0.7 10*3/uL (ref 0.1–1.0)
Monocytes Relative: 5 %
Neutro Abs: 9.3 10*3/uL — ABNORMAL HIGH (ref 1.7–7.7)
Neutrophils Relative %: 77 %
PLATELETS: 330 10*3/uL (ref 150–400)
RBC: 3.11 MIL/uL — ABNORMAL LOW (ref 4.22–5.81)
RDW: 17.9 % — AB (ref 11.5–15.5)
WBC: 12.1 10*3/uL — ABNORMAL HIGH (ref 4.0–10.5)

## 2016-01-19 LAB — BASIC METABOLIC PANEL
ANION GAP: 14 (ref 5–15)
BUN: 33 mg/dL — AB (ref 6–20)
CALCIUM: 8.2 mg/dL — AB (ref 8.9–10.3)
CO2: 30 mmol/L (ref 22–32)
CREATININE: 10.1 mg/dL — AB (ref 0.61–1.24)
Chloride: 90 mmol/L — ABNORMAL LOW (ref 101–111)
GFR calc Af Amer: 5 mL/min — ABNORMAL LOW (ref >60)
GFR, EST NON AFRICAN AMERICAN: 4 mL/min — AB (ref >60)
GLUCOSE: 352 mg/dL — AB (ref 65–99)
Potassium: 4 mmol/L (ref 3.5–5.1)
Sodium: 134 mmol/L — ABNORMAL LOW (ref 135–145)

## 2016-01-19 LAB — POCT I-STAT 4, (NA,K, GLUC, HGB,HCT)
Glucose, Bld: 277 mg/dL — ABNORMAL HIGH (ref 65–99)
HEMATOCRIT: 38 % — AB (ref 39.0–52.0)
Hemoglobin: 12.9 g/dL — ABNORMAL LOW (ref 13.0–17.0)
Potassium: 4 mmol/L (ref 3.5–5.1)
SODIUM: 136 mmol/L (ref 135–145)

## 2016-01-19 SURGERY — REMOVAL, TUNNELED HEMODIALYSIS ACCESS GRAFT CONNECTED TO CENTRAL VENOUS CATHETER
Anesthesia: General | Site: Chest | Laterality: Right

## 2016-01-19 MED ORDER — ACETAMINOPHEN-CODEINE #3 300-30 MG PO TABS: 1.0000 | ORAL_TABLET | ORAL | Status: DC | PRN

## 2016-01-19 MED ORDER — PROPOFOL 10 MG/ML IV BOLUS
INTRAVENOUS | Status: AC
  Filled 2016-01-19: qty 20

## 2016-01-19 MED ORDER — LIDOCAINE HCL (PF) 1 % IJ SOLN
INTRAMUSCULAR | Status: AC
  Filled 2016-01-19: qty 30

## 2016-01-19 MED ORDER — 0.9 % SODIUM CHLORIDE (POUR BTL) OPTIME
TOPICAL | Status: DC | PRN
  Administered 2016-01-19: 1000 mL

## 2016-01-19 MED ORDER — CEFAZOLIN SODIUM-DEXTROSE 2-4 GM/100ML-% IV SOLN
INTRAVENOUS | Status: AC
  Filled 2016-01-19: qty 100

## 2016-01-19 MED ORDER — FUROSEMIDE 40 MG PO TABS: 40.0000 mg | ORAL_TABLET | Freq: Every day | ORAL | Status: DC

## 2016-01-19 MED ORDER — LIDOCAINE HCL (CARDIAC) 20 MG/ML IV SOLN
INTRAVENOUS | Status: AC
  Filled 2016-01-19: qty 5

## 2016-01-19 MED ORDER — HYDROMORPHONE HCL 1 MG/ML IJ SOLN: 0.2500 mg | INTRAMUSCULAR | Status: DC | PRN

## 2016-01-19 MED ORDER — INSULIN ASPART 100 UNIT/ML ~~LOC~~ SOLN
SUBCUTANEOUS | Status: AC
  Administered 2016-01-19: 5 [IU] via SUBCUTANEOUS
  Filled 2016-01-19: qty 1

## 2016-01-19 MED ORDER — FENTANYL CITRATE (PF) 250 MCG/5ML IJ SOLN
INTRAMUSCULAR | Status: DC | PRN
  Administered 2016-01-19 (×3): 25 ug via INTRAVENOUS
  Administered 2016-01-19: 50 ug via INTRAVENOUS

## 2016-01-19 MED ORDER — HEPARIN SODIUM (PORCINE) 1000 UNIT/ML IJ SOLN
INTRAMUSCULAR | Status: AC
  Filled 2016-01-19: qty 1

## 2016-01-19 MED ORDER — LIDOCAINE HCL (CARDIAC) 20 MG/ML IV SOLN
INTRAVENOUS | Status: DC | PRN
  Administered 2016-01-19: 80 mg via INTRATRACHEAL

## 2016-01-19 MED ORDER — DOXAZOSIN MESYLATE 8 MG PO TABS: 8.0000 mg | ORAL_TABLET | Freq: Every day | ORAL | Status: DC

## 2016-01-19 MED ORDER — PHENYLEPHRINE HCL 10 MG/ML IJ SOLN
10.0000 mg | INTRAVENOUS | Status: DC | PRN
  Administered 2016-01-19: 60 ug/min via INTRAVENOUS

## 2016-01-19 MED ORDER — PHENYLEPHRINE HCL 10 MG/ML IJ SOLN
INTRAMUSCULAR | Status: DC | PRN
  Administered 2016-01-19: 60 ug via INTRAVENOUS

## 2016-01-19 MED ORDER — HEPARIN SODIUM (PORCINE) 1000 UNIT/ML IJ SOLN
INTRAMUSCULAR | Status: DC | PRN
  Administered 2016-01-19: 4.6 mL via INTRAVENOUS

## 2016-01-19 MED ORDER — FENTANYL CITRATE (PF) 250 MCG/5ML IJ SOLN
INTRAMUSCULAR | Status: AC
  Filled 2016-01-19: qty 5

## 2016-01-19 MED ORDER — PROPOFOL 10 MG/ML IV BOLUS
INTRAVENOUS | Status: DC | PRN
  Administered 2016-01-19: 150 mg via INTRAVENOUS

## 2016-01-19 MED ORDER — CHLORHEXIDINE GLUCONATE CLOTH 2 % EX PADS: 6.0000 | MEDICATED_PAD | Freq: Once | CUTANEOUS | Status: DC

## 2016-01-19 MED ORDER — ONDANSETRON HCL 4 MG/2ML IJ SOLN
INTRAMUSCULAR | Status: AC
  Filled 2016-01-19: qty 2

## 2016-01-19 MED ORDER — PHENYLEPHRINE 40 MCG/ML (10ML) SYRINGE FOR IV PUSH (FOR BLOOD PRESSURE SUPPORT)
PREFILLED_SYRINGE | INTRAVENOUS | Status: AC
  Filled 2016-01-19: qty 10

## 2016-01-19 MED ORDER — SODIUM CHLORIDE 0.9 % IV SOLN
INTRAVENOUS | Status: DC | PRN
  Administered 2016-01-19: 08:00:00

## 2016-01-19 MED ORDER — CLONIDINE HCL 0.1 MG PO TABS
0.1000 mg | ORAL_TABLET | Freq: Once | ORAL | Status: AC
  Administered 2016-01-19: 0.1 mg via ORAL
  Filled 2016-01-19: qty 1

## 2016-01-19 MED ORDER — INSULIN ASPART 100 UNIT/ML ~~LOC~~ SOLN
5.0000 [IU] | Freq: Once | SUBCUTANEOUS | Status: AC
  Administered 2016-01-19: 5 [IU] via SUBCUTANEOUS
  Filled 2016-01-19: qty 0.05

## 2016-01-19 MED ORDER — CLONIDINE HCL 0.2 MG PO TABS: 0.2000 mg | ORAL_TABLET | Freq: Three times a day (TID) | ORAL | Status: DC

## 2016-01-19 MED ORDER — CLONIDINE HCL 0.1 MG/24HR TD PTWK: 0.1000 mg | MEDICATED_PATCH | Freq: Once | TRANSDERMAL | Status: DC

## 2016-01-19 MED ORDER — ONDANSETRON HCL 4 MG/2ML IJ SOLN
INTRAMUSCULAR | Status: DC | PRN
  Administered 2016-01-19: 4 mg via INTRAVENOUS

## 2016-01-19 SURGICAL SUPPLY — 58 items
BAG DECANTER FOR FLEXI CONT (MISCELLANEOUS) ×3 IMPLANT
BIOPATCH RED 1 DISK 7.0 (GAUZE/BANDAGES/DRESSINGS) ×3 IMPLANT
CANISTER SUCTION 2500CC (MISCELLANEOUS) ×3 IMPLANT
CANNULA VESSEL 3MM 2 BLNT TIP (CANNULA) ×1 IMPLANT
CATH PALINDROME RT-P 15FX19CM (CATHETERS) IMPLANT
CATH PALINDROME RT-P 15FX23CM (CATHETERS) ×1 IMPLANT
CATH PALINDROME RT-P 15FX28CM (CATHETERS) IMPLANT
CATH PALINDROME RT-P 15FX55CM (CATHETERS) IMPLANT
CATH STRAIGHT 5FR 65CM (CATHETERS) IMPLANT
CHLORAPREP W/TINT 26ML (MISCELLANEOUS) ×3 IMPLANT
CLIP TI MEDIUM 6 (CLIP) ×1 IMPLANT
CLIP TI WIDE RED SMALL 6 (CLIP) ×1 IMPLANT
COVER PROBE W GEL 5X96 (DRAPES) ×3 IMPLANT
DECANTER SPIKE VIAL GLASS SM (MISCELLANEOUS) ×3 IMPLANT
DRAPE C-ARM 42X72 X-RAY (DRAPES) ×3 IMPLANT
DRAPE CHEST BREAST 15X10 FENES (DRAPES) ×3 IMPLANT
ELECT REM PT RETURN 9FT ADLT (ELECTROSURGICAL) ×3
ELECTRODE REM PT RTRN 9FT ADLT (ELECTROSURGICAL) ×2 IMPLANT
GAUZE SPONGE 2X2 8PLY STRL LF (GAUZE/BANDAGES/DRESSINGS) ×2 IMPLANT
GAUZE SPONGE 4X4 12PLY STRL (GAUZE/BANDAGES/DRESSINGS) ×3 IMPLANT
GAUZE SPONGE 4X4 16PLY XRAY LF (GAUZE/BANDAGES/DRESSINGS) ×3 IMPLANT
GLOVE BIO SURGEON STRL SZ7.5 (GLOVE) ×3 IMPLANT
GLOVE BIOGEL PI IND STRL 6.5 (GLOVE) IMPLANT
GLOVE BIOGEL PI IND STRL 7.5 (GLOVE) IMPLANT
GLOVE BIOGEL PI INDICATOR 6.5 (GLOVE) ×1
GLOVE BIOGEL PI INDICATOR 7.5 (GLOVE) ×2
GLOVE SURG SS PI 7.5 STRL IVOR (GLOVE) ×2 IMPLANT
GOWN STRL REUS W/ TWL LRG LVL3 (GOWN DISPOSABLE) ×6 IMPLANT
GOWN STRL REUS W/TWL LRG LVL3 (GOWN DISPOSABLE) ×9
KIT BASIN OR (CUSTOM PROCEDURE TRAY) ×3 IMPLANT
KIT ROOM TURNOVER OR (KITS) ×3 IMPLANT
LIQUID BAND (GAUZE/BANDAGES/DRESSINGS) ×3 IMPLANT
NDL 18GX1X1/2 (RX/OR ONLY) (NEEDLE) ×2 IMPLANT
NDL HYPO 25GX1X1/2 BEV (NEEDLE) ×2 IMPLANT
NEEDLE 18GX1X1/2 (RX/OR ONLY) (NEEDLE) ×3 IMPLANT
NEEDLE HYPO 25GX1X1/2 BEV (NEEDLE) ×3 IMPLANT
NS IRRIG 1000ML POUR BTL (IV SOLUTION) ×3 IMPLANT
PACK CV ACCESS (CUSTOM PROCEDURE TRAY) ×3 IMPLANT
PACK SURGICAL SETUP 50X90 (CUSTOM PROCEDURE TRAY) ×3 IMPLANT
PAD ARMBOARD 7.5X6 YLW CONV (MISCELLANEOUS) ×6 IMPLANT
SET MICROPUNCTURE 5F STIFF (MISCELLANEOUS) IMPLANT
SPONGE GAUZE 2X2 STER 10/PKG (GAUZE/BANDAGES/DRESSINGS) ×1
SPONGE SURGIFOAM ABS GEL 100 (HEMOSTASIS) ×3 IMPLANT
SUT ETHILON 3 0 PS 1 (SUTURE) ×3 IMPLANT
SUT PROLENE 6 0 CC (SUTURE) ×3 IMPLANT
SUT SILK 0 TIES 10X30 (SUTURE) ×1 IMPLANT
SUT VIC AB 3-0 SH 27 (SUTURE) ×3
SUT VIC AB 3-0 SH 27X BRD (SUTURE) ×2 IMPLANT
SUT VICRYL 4-0 PS2 18IN ABS (SUTURE) ×6 IMPLANT
SYR 20CC LL (SYRINGE) ×6 IMPLANT
SYR 30ML LL (SYRINGE) IMPLANT
SYR 5ML LL (SYRINGE) ×3 IMPLANT
SYR CONTROL 10ML LL (SYRINGE) ×3 IMPLANT
SYRINGE 10CC LL (SYRINGE) ×3 IMPLANT
TAPE CLOTH SURG 4X10 WHT LF (GAUZE/BANDAGES/DRESSINGS) ×1 IMPLANT
UNDERPAD 30X30 INCONTINENT (UNDERPADS AND DIAPERS) ×3 IMPLANT
WATER STERILE IRR 1000ML POUR (IV SOLUTION) ×3 IMPLANT
WIRE AMPLATZ SS-J .035X180CM (WIRE) IMPLANT

## 2016-01-19 NOTE — ED Provider Notes (Signed)
CSN: [id number]     Arrival date & time 01/19/16  1350 History   First MD Initiated Contact with Patient 01/19/16 1354     Chief Complaint  Patient presents with  . Hypertension     (Consider location/radiation/quality/duration/timing/severity/associated sxs/prior Treatment) HPI...Marland KitchenMarland KitchenPatient has end-stage renal disease with dialysis on Monday Wednesday and Friday. His blood pressure is elevated, but he apparently has run out of his blood pressure prescriptions. He had a port in his right arm which was discontinued today in [place]. He's been eating okay. Diarrhea 2 episodes earlier today. He uses a walker home.  Past Medical History  Diagnosis Date  . Hypertension   . Hyperlipidemia   . Cancer [facility])     Prostate cancer treated with radiation therapy  . Thyroid disease     hyperparathyroidism  . Anemia   . Positive PPD   . Clot 05/2006    Arterial clot right leg   . Hemodialysis patient ([place]) 6/13  . Diverticulosis   . Anxiety   . Chronic kidney disease     dialysis Mon, Wednes, Fri  . Shortness of breath dyspnea     with exertion, when he has too much fluid  . Constipation   . GERD (gastroesophageal reflux disease)   . Infection     drainage from distal incision of left upper arm arteriovenous graft  . Diabetes mellitus     Type II  . Steal syndrome dialysis vascular access ([place])   . Wears glasses    Past Surgical History  Procedure Laterality Date  . 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 Av fistula placement  11/04/2010    left and left  . Av fistula placement  11/20/2011    Procedure: INSERTION OF ARTERIOVENOUS (AV) GORE-TEX GRAFT ARM;  Surgeon: Angelia Mould, MD;  [facility];  Service: Vascular;  Laterality: Right;  . Colonoscopy N/A 11/10/2012    [id number] proctitis/Pancolonic diverticulosis/(TUBULAR ADENOMA)5 mm polyp in the transverse segment/4 mm polyps in the base of the cecum and diminutive polyp in the descending segment.  INADEQUATE BOWEL PREP  . Colonoscopy N/A 11/28/2013    RMR: Radiation proctitis. Colonic diverticulosis. Colonic polyps-removed as described above.   [name] N/A 10/08/2012    Procedure: [name];  Surgeon: Elam Dutch, MD;  [facility] C[facility] LAB;  Service: Cardiovascular;  Laterality: N/A;  . Av fistula placement Right 04/19/2015    Procedure: Excision of infected RUA AVG;  Surgeon: Conrad Gray, MD;  Location: [place];  Service: Vascular;  Laterality: Right;  . Bascilic vein transposition Left 06/19/2015    Procedure: FIRST STAGE BRACHIAL VEIN TRANSPOSITION;  Surgeon: Conrad Easley, MD;  Location: [place];  Service: Vascular;  Laterality: Left;  . Av fistula placement Left 10/04/2015    Procedure: INSERTION OF ARTERIOVENOUS (AV) GORE-TEX GRAFT ARM;  Surgeon: Conrad [place], MD;  Location: [place];  Service: Vascular;  Laterality: Left;  . Avgg removal Left 10/13/2015    Procedure: REMOVAL OF INFECTED ARTERIOVENOUS GORETEX LEFT GRAFT ;  Surgeon: Conrad Bethune, MD;  Location: [place];  Service: Vascular;  Laterality: Left;  . Peripheral vascular catheterization Bilateral 12/06/2015    Procedure: Upper Extremity Venography/ central venogram;  Surgeon: Conrad [place], MD;  Location: [place] CV LAB;  Service: Cardiovascular;  Laterality: Bilateral;  . Vascular access device insertion Right 01/01/2016    Procedure: INSERTION OF HERO VASCULAR ACCESS DEVICE - RIGHT;  Surgeon: Conrad [place], MD;  [facility];  Service: Vascular;  Laterality: Right;   Family History  Problem Relation Age of Onset  . Heart disease Mother   . Diabetes Mother   . Hypertension Mother   . Stroke Mother   . Heart disease Father   . Diabetes Father   . Heart disease Sister   . Diabetes Brother   . Colon cancer Neg Hx    Social History  Substance Use Topics  . Smoking status: Light Tobacco Smoker -- 0.25 packs/day for 50 years    Types: Cigarettes  . Smokeless tobacco: Never Used     Comment: 4-5 cigarettes daily  .  Alcohol Use: No    Review of Systems  All other systems reviewed and are negative.     Allergies  Review of patient's allergies indicates no known allergies.  Home Medications   Prior to Admission medications   Medication Sig Start Date End Date Taking? Authorizing Provider  aspirin EC 81 MG tablet Take 81-162 mg by mouth 2 (two) times daily. Take 2 tablets (162 mg) by mouth every morning and 1 tablet (81 mg) at night   Yes Historical Provider, MD  buPROPion (WELLBUTRIN XL) 150 MG 24 hr tablet Take 150 mg by mouth daily. 03/05/15  Yes Historical Provider, MD  calcium acetate (PHOSLO) 667 MG capsule Take 667-1,334 mg by mouth See admin instructions. Take 2 capsules (1334 mg) by mouth with breakfast and with supper, take 1 capsule (667 mg) with snacks or small meals 10/26/12  Yes Historical Provider, MD  cloNIDine (CATAPRES) 0.2 MG tablet Take 0.2 mg by mouth 3 (three) times daily.    Yes Historical Provider, MD  doxazosin (CARDURA) 8 MG tablet Take 8 mg by mouth daily.   Yes Historical Provider, MD  furosemide (LASIX) 40 MG tablet Take 40 mg by mouth daily.   Yes Historical Provider, MD  gabapentin (NEURONTIN) 100 MG capsule Take 100 mg by mouth 2 (two) times daily. 04/13/15  Yes Historical Provider, MD  insulin detemir (LEVEMIR) 100 UNIT/ML injection Inject 25 Units into the skin at bedtime.    Yes Historical Provider, MD  Linaclotide Rolan Lipa) 145 MCG CAPS capsule Take 1 capsule (145 mcg total) by mouth daily. Take 30 minutes before breakfast Patient taking differently: Take 145 mcg by mouth See admin instructions. Take 30 minutes before breakfast on Sunday, Tuesday, Thursday, Saturday (non-dialysis days) 02/06/15  Yes Carlis Stable, NP  omeprazole (PRILOSEC) 20 MG capsule Take 20 mg by mouth 2 (two) times daily before a meal.  02/20/15  Yes Historical Provider, MD  oxyCODONE-acetaminophen (PERCOCET/ROXICET) 5-325 MG tablet Take 1 tablet by mouth every 6 (six) hours as needed. Patient taking  differently: Take 1 tablet by mouth every 6 (six) hours as needed for moderate pain.  01/18/16  Yes Kristen N Ward, DO  Polyvinyl Alcohol-Povidone (REFRESH OP) Place 1 drop into both eyes at bedtime.   Yes Historical Provider, MD  QUEtiapine (SEROQUEL) 100 MG tablet Take 100 mg by mouth at bedtime. 03/05/15  Yes Historical Provider, MD  saxagliptin HCl (ONGLYZA) 5 MG TABS tablet Take 5 mg by mouth daily with breakfast.    Yes Historical Provider, MD  SENSIPAR 30 MG tablet Take 1 tablet by mouth daily. 11/20/15  Yes Historical Provider, MD  simvastatin (ZOCOR) 20 MG tablet Take 20 mg by mouth daily.   Yes Historical Provider, MD  Vancomycin (VANCOCIN) 750 MG/150ML SOLN Inject 150 mLs (750 mg total) into the vein every Monday, Wednesday, and Friday  with hemodialysis. 10/17/15  Yes Kimberly A Trinh, PA-C  acetaminophen-codeine (TYLENOL #3) 300-30 MG tablet Take 1 tablet by mouth every 4 (four) hours as needed for moderate pain. 01/19/16   Elam Dutch, MD  cloNIDine (CATAPRES) 0.2 MG tablet Take 1 tablet (0.2 mg total) by mouth 3 (three) times daily. 01/19/16   [name], MD  doxazosin (CARDURA) 8 MG tablet Take 1 tablet (8 mg total) by mouth daily. 01/19/16   [name], MD  furosemide (LASIX) 40 MG tablet Take 1 tablet (40 mg total) by mouth daily. 01/19/16   [name], MD   BP 202/80 mmHg  Pulse 95  Temp(Src) 97.2 F (36.2 C) (Temporal)  Resp 18  Ht 5\' 9"  (1.753 m)  Wt 178 lb (80.74 kg)  BMI 26.27 kg/m2  SpO2 93% Physical Exam  Constitutional: He is oriented to person, place, and time.  No acute distress  HENT:  Head: Normocephalic and atraumatic.  Eyes: Conjunctivae and EOM are normal. Pupils are equal, round, and reactive to light.  Neck: Normal range of motion. Neck supple.  Cardiovascular: Normal rate and regular rhythm.   Pulmonary/Chest: Effort normal and breath sounds normal.  Abdominal: Soft. Bowel sounds are normal.  Musculoskeletal: Normal range of motion.  Neurological: He is  alert and oriented to person, place, and time.  Skin: Skin is warm and dry.  Psychiatric: He has a normal mood and affect. His behavior is normal.  Nursing note and vitals reviewed.   ED Course  Procedures (including critical care time) Labs Review Labs Reviewed  CBC WITH DIFFERENTIAL/PLATELET - Abnormal; Notable for the following:    WBC 12.1 (*)    RBC 3.11 (*)    Hemoglobin 11.1 (*)    HCT 33.7 (*)    MCV 108.4 (*)    MCH 35.7 (*)    RDW 17.9 (*)    Neutro Abs 9.3 (*)    All other components within normal limits  BASIC METABOLIC PANEL - Abnormal; Notable for the following:    Sodium 134 (*)    Chloride 90 (*)    Glucose, Bld 352 (*)    BUN 33 (*)    Creatinine, Ser 10.10 (*)    Calcium 8.2 (*)    GFR calc non Af Amer 4 (*)    GFR calc Af Amer 5 (*)    All other components within normal limits    Imaging Review Dg Chest Port 1 View  01/19/2016  CLINICAL DATA:  Encounter for central line placement. EXAM: PORTABLE CHEST 1 VIEW COMPARISON:  01/17/2015 FINDINGS: Right internal jugular dual-lumen central venous catheter has its distal tip projecting at the caval atrial junction. No pneumothorax. Clear lungs.  Heart, mediastinum and hila are unremarkable. IMPRESSION: 1. Right internal jugular dual-lumen central venous catheter is well positioned with its distal port tip at the caval atrial junction. 2. No pneumothorax.  No acute cardiopulmonary disease Electronically Signed   By: Lajean Manes M.D.   On: 01/19/2016 09:20   Dg Hand Complete Right  01/18/2016  CLINICAL DATA:  Right hand pain for many months.  No injury. EXAM: RIGHT HAND - COMPLETE 3+ VIEW COMPARISON:  None. FINDINGS: There is no evidence of fracture or dislocation. There is no evidence of arthropathy or other focal bone abnormality. Soft tissues are unremarkable. IMPRESSION: Negative. Electronically Signed   By: Lucienne Capers M.D.   On: 01/18/2016 05:42   Dg Fluoro Guide Cv Line-no Report  01/19/2016  CLINICAL  DATA:  FLOURO GUIDE CV LINE Fluoroscopy was utilized by the requesting physician.  No radiographic interpretation.   I have personally reviewed and evaluated these images and lab results as part of my medical decision-making.   EKG Interpretation None      MDM   Final diagnoses:  Essential hypertension, benign  ESRD (end stage renal disease) ([place])    Patient appears to be at his normal. His son confided in me that he has not been taking his blood pressure medicine. Will refill clonidine 0.2 mg, Lasix 40 mg , Cardura 8 mg. He will see his primary care doctor next week.    [name], MD 01/19/16 Bosie Helper

## 2016-01-19 NOTE — Anesthesia Preprocedure Evaluation (Addendum)
Anesthesia Evaluation  Patient identified by MRN, date of birth, ID band Patient awake    Reviewed: Allergy & Precautions, NPO status , Patient's Chart, lab work & pertinent test results  History of Anesthesia Complications Negative for: history of anesthetic complications  Airway Mallampati: I  TM Distance: >3 FB Neck ROM: Full    Dental  (+) Edentulous Upper, Edentulous Lower, Dental Advisory Given, Upper Dentures, Lower Dentures   Pulmonary shortness of breath, Current Smoker,    Pulmonary exam normal breath sounds clear to auscultation       Cardiovascular hypertension, Normal cardiovascular exam Rhythm:Regular Rate:Normal     Neuro/Psych PSYCHIATRIC DISORDERS Anxiety negative neurological ROS     GI/Hepatic Neg liver ROS, GERD  Medicated,  Endo/Other  diabetes, Type 2, Insulin Dependent  Renal/GU ESRF and DialysisRenal disease (MWF)     Musculoskeletal   Abdominal   Peds  Hematology   Anesthesia Other Findings Prostate cancer s/p radiation  Reproductive/Obstetrics                            Anesthesia Physical Anesthesia Plan  ASA: III  Anesthesia Plan: General   Post-op Pain Management:    Induction: Intravenous  Airway Management Planned: LMA  Additional Equipment:   Intra-op Plan:   Post-operative Plan: Extubation in OR  Informed Consent: I have reviewed the patients History and Physical, chart, labs and discussed the procedure including the risks, benefits and alternatives for the proposed anesthesia with the patient or authorized representative who has indicated his/her understanding and acceptance.     Plan Discussed with: CRNA and Surgeon  Anesthesia Plan Comments:         Anesthesia Quick Evaluation

## 2016-01-19 NOTE — H&P (View-Only) (Signed)
Postoperative Access Visit   History of Present Illness  Gerald Davenport is a 75 y.o. year old male who presents for postoperative follow-up for: venoplasty R innominate vein and SVC, HeRO placement with Accuseal (Date: 01/01/16).  The patient initially had steal sx with start of use of the HeRO graft.  This patient previously had significant steal issues due to poor inflow to the L arm requiring removal of the LUA AVG.  Reportedly his right hand sx have worsened though he continued to use the R UA HeRO for HD.  The patient's wounds are healing.  The patient notes severe steal symptoms.  The patient is not able to complete their activities of daily living.  The patient's current symptoms are: severe pain in right arm extending to hand.  Past Medical History  Diagnosis Date  . Hypertension   . Hyperlipidemia   . Cancer [facility])     Prostate cancer treated with radiation therapy  . Thyroid disease     hyperparathyroidism  . Anemia   . Positive PPD   . Clot 05/2006    Arterial clot right leg   . Hemodialysis patient ([place]) 6/13  . Diverticulosis   . Anxiety   . Chronic kidney disease     dialysis Mon, Wednes, Fri  . Shortness of breath dyspnea     with exertion, when he has too much fluid  . Constipation   . GERD (gastroesophageal reflux disease)   . Infection     drainage from distal incision of left upper arm arteriovenous graft  . Diabetes mellitus     Type II    Past Surgical History  Procedure Laterality Date  . 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 Av fistula placement  11/04/2010    left and left  . Av fistula placement  11/20/2011    Procedure: INSERTION OF ARTERIOVENOUS (AV) GORE-TEX GRAFT ARM;  Surgeon: Angelia Mould, MD;  Location: [facility] OR;  Service: Vascular;  Laterality: Right;  . Colonoscopy N/A 11/10/2012    [id number] proctitis/Pancolonic diverticulosis/(TUBULAR ADENOMA)5 mm polyp in the transverse segment/4 mm polyps in the base of the cecum and  diminutive polyp in the descending segment. INADEQUATE BOWEL PREP  . Colonoscopy N/A 11/28/2013    RMR: Radiation proctitis. Colonic diverticulosis. Colonic polyps-removed as described above.   [name] N/A 10/08/2012    Procedure: [name];  Surgeon: Elam Dutch, MD;  Location: [facility] CATH LAB;  Service: Cardiovascular;  Laterality: N/A;  . Av fistula placement Right 04/19/2015    Procedure: Excision of infected RUA AVG;  Surgeon: Conrad [place], MD;  Location: [place];  Service: Vascular;  Laterality: Right;  . Bascilic vein transposition Left 06/19/2015    Procedure: FIRST STAGE BRACHIAL VEIN TRANSPOSITION;  Surgeon: Conrad [place], MD;  Location: [place];  Service: Vascular;  Laterality: Left;  . Av fistula placement Left 10/04/2015    Procedure: INSERTION OF ARTERIOVENOUS (AV) GORE-TEX GRAFT ARM;  Surgeon: Conrad Bivalve, MD;  Location: [place];  Service: Vascular;  Laterality: Left;  . Avgg removal Left 10/13/2015    Procedure: REMOVAL OF INFECTED ARTERIOVENOUS GORETEX LEFT GRAFT ;  Surgeon: Conrad [place], MD;  Location: [place];  Service: Vascular;  Laterality: Left;  . Peripheral vascular catheterization Bilateral 12/06/2015    Procedure: Upper Extremity Venography/ central venogram;  Surgeon: Conrad , MD;  Location: [place] CV LAB;  Service: Cardiovascular;  Laterality: Bilateral;  . Vascular access  device insertion Right 01/01/2016    Procedure: INSERTION OF HERO VASCULAR ACCESS DEVICE - RIGHT;  Surgeon: Conrad [place], MD;  Location: [place];  Service: Vascular;  Laterality: Right;    Social History   Social History  . Marital Status: Married    Spouse Name: N/A  . Number of Children: N/A  . Years of Education: N/A   Occupational History  . Not on file.   Social History Main Topics  . Smoking status: Light Tobacco Smoker -- 0.25 packs/day for 50 years    Types: Cigarettes  . Smokeless tobacco: Never Used     Comment: 4-5 cigarettes daily  . Alcohol Use: No  . Drug Use: No  . Sexual  Activity: Not on file   Other Topics Concern  . Not on file   Social History Narrative    Family History  Problem Relation Age of Onset  . Heart disease Mother   . Diabetes Mother   . Hypertension Mother   . Stroke Mother   . Heart disease Father   . Diabetes Father   . Heart disease Sister   . Diabetes Brother   . Colon cancer Neg Hx     Current Outpatient Prescriptions  Medication Sig Dispense Refill  . aspirin EC 81 MG tablet Take 81-162 mg by mouth 2 (two) times daily. Take 2 tablets (162 mg) by mouth every morning and 1 tablet (81 mg) at night    . buPROPion (WELLBUTRIN XL) 150 MG 24 hr tablet Take 150 mg by mouth daily.    . calcium acetate (PHOSLO) 667 MG capsule Take 667-1,334 mg by mouth See admin instructions. Take 2 capsules (1334 mg) by mouth with breakfast and with supper, take 1 capsule (667 mg) with snacks or small meals    . cloNIDine (CATAPRES) 0.2 MG tablet Take 0.2 mg by mouth 3 (three) times daily.     Marland Kitchen doxazosin (CARDURA) 8 MG tablet Take 8 mg by mouth daily.    . furosemide (LASIX) 40 MG tablet Take 40 mg by mouth daily.    Marland Kitchen gabapentin (NEURONTIN) 100 MG capsule Take 100 mg by mouth 2 (two) times daily.    . insulin detemir (LEVEMIR) 100 UNIT/ML injection Inject 25 Units into the skin at bedtime.     . Linaclotide (LINZESS) 145 MCG CAPS capsule Take 1 capsule (145 mcg total) by mouth daily. Take 30 minutes before breakfast (Patient taking differently: Take 145 mcg by mouth See admin instructions. Take 30 minutes before breakfast on Sunday, Tuesday, Thursday, Saturday (non-dialysis days)) 30 capsule 11  . omeprazole (PRILOSEC) 20 MG capsule Take 20 mg by mouth 2 (two) times daily before a meal.     . oxyCODONE-acetaminophen (PERCOCET/ROXICET) 5-325 MG tablet Take 1 tablet by mouth every 6 (six) hours as needed. 20 tablet 0  . oxyCODONE-acetaminophen (PERCOCET/ROXICET) 5-325 MG tablet Take 1 tablet by mouth every 6 (six) hours as needed. 6 tablet 0  .  Polyvinyl Alcohol-Povidone (REFRESH OP) Place 1 drop into both eyes at bedtime.    Marland Kitchen QUEtiapine (SEROQUEL) 100 MG tablet Take 100 mg by mouth at bedtime.    . saxagliptin HCl (ONGLYZA) 5 MG TABS tablet Take 5 mg by mouth daily with breakfast.     . SENSIPAR 30 MG tablet Take 1 tablet by mouth daily.    . simvastatin (ZOCOR) 20 MG tablet Take 20 mg by mouth daily.    . Vancomycin (VANCOCIN) 750 MG/150ML SOLN Inject 150 mLs (750 mg  total) into the vein every Monday, Wednesday, and Friday with hemodialysis. 450 mL 0   No current facility-administered medications for this visit.     No Known Allergies   REVIEW OF SYSTEMS:  (Positives checked otherwise negative)  CARDIOVASCULAR:   [ ]  chest pain,  [ ]  chest pressure,  [ ]  palpitations,  [ ]  shortness of breath when laying flat,  [ ]  shortness of breath with exertion,   [ ]  pain in feet when walking,  [ ]  pain in feet when laying flat, [ ]  history of blood clot in veins (DVT),  [ ]  history of phlebitis,  [ ]  swelling in legs,  [ ]  varicose veins  PULMONARY:   [ ]  productive cough,  [ ]  asthma,  [ ]  wheezing  NEUROLOGIC:   [ ]  weakness in arms or legs,  [ ]  numbness in arms or legs,  [ ]  difficulty speaking or slurred speech,  [ ]  temporary loss of vision in one eye,  [ ]  dizziness  HEMATOLOGIC:   [ ]  bleeding problems,  [ ]  problems with blood clotting too easily  MUSCULOSKEL:   [ ]  joint pain, [ ]  joint swelling  GASTROINTEST:   [ ]  vomiting blood,  [ ]  blood in stool     GENITOURINARY:   [ ]  burning with urination,  [ ]  blood in urine [x]  ESRD-HD: M-W-F  PSYCHIATRIC:   [ ]  history of major depression  INTEGUMENTARY:   [ ]  rashes,  [ ]  ulcers  CONSTITUTIONAL:   [ ]  fever,  [ ]  chills   For VQI Use Only  PRE-ADM LIVING: Home  AMB STATUS: Ambulatory  Physical Examination Filed Vitals:   01/18/16 1302  BP: 206/85  Pulse: 108  Temp: 99 F (37.2 C)  Resp: 14   Pulmonary: Sym exp, good air  movt, CTAB, no rales, rhonchi, & wheezing  Cardiac: RRR, Nl S1, S2, no Murmurs, rubs or gallops  RUE: Incisions are c/d/i, some hematoma palpable at cannulation sites, skin feels cool in hand, hand grip is 1/5, sensation in digits is decreased, bruit can be auscultated   Medical Decision Making  Gerald Davenport is a 75 y.o. year old male who presents s/p venoplasty R innominate vein and SVC, HeRO placement with Accuseal complicated with severe steal   Given severity of sx, would favor removing the AVG and replacing the [facility]. The patient is aware the risks of tunneled dialysis catheter placement include but are not limited to: bleeding, infection, central venous injury, pneumothorax, possible venous stenosis, possible malpositioning in the venous system, and possible infections related to long-term catheter presence.  The patient was aware of these risks and agreed to proceed. Will try to get this patient scheduled tomorrow with Dr. Oneida Alar.  Thank you for allowing [place] to participate in this patient's care.  Adele Barthel, MD [facility] [place] Office: [phone] Pager: [phone]  01/18/2016, 8:32 AM

## 2016-01-19 NOTE — Anesthesia Procedure Notes (Signed)
Procedure Name: LMA Insertion Date/Time: 01/19/2016 7:32 AM Performed by: Marinda Elk A Pre-anesthesia Checklist: Patient identified, Emergency Drugs available, Suction available, Patient being monitored and Timeout performed Patient Re-evaluated:Patient Re-evaluated prior to inductionOxygen Delivery Method: Circle System Utilized and Circle system utilized Preoxygenation: Pre-oxygenation with 100% oxygen Intubation Type: IV induction Ventilation: Mask ventilation without difficulty LMA: LMA inserted LMA Size: 4.0 Number of attempts: 1 Placement Confirmation: positive ETCO2 and breath sounds checked- equal and bilateral Tube secured with: Tape Dental Injury: Teeth and Oropharynx as per pre-operative assessment

## 2016-01-19 NOTE — Discharge Instructions (Signed)
Refills given for blood pressure medicine for 2 weeks. You must follow-up with your primary care doctor.

## 2016-01-19 NOTE — Anesthesia Postprocedure Evaluation (Signed)
Anesthesia Post Note  Patient: Gerald Davenport  Procedure(s) Performed: Procedure(s) (LRB): REMOVAL OF A HERO DEVICE (Right) INSERTION OF Right internal jugular  DIALYSIS CATHETER. (Right)  Patient location during evaluation: PACU Anesthesia Type: General Level of consciousness: awake and alert Pain management: pain level controlled Vital Signs Assessment: post-procedure vital signs reviewed and stable Respiratory status: spontaneous breathing, nonlabored ventilation, respiratory function stable and patient connected to nasal cannula oxygen Cardiovascular status: blood pressure returned to baseline and stable Postop Assessment: no signs of nausea or vomiting Anesthetic complications: no    Last Vitals:  Filed Vitals:   01/19/16 1000 01/19/16 1004  BP:  228/101  Pulse: 92 92  Temp: 36.6 C   Resp: 12 14    Last Pain: There were no vitals filed for this visit.               Muslima Toppins,JAMES TERRILL

## 2016-01-19 NOTE — Op Note (Signed)
Procedure: Removal of HERO graft, insertion of right internal jugular vein Diatek catheter  Preoperative diagnosis: Ischemic steal right hand, end-stage renal disease in her postoperative diagnosis: Same  Anesthesia: Gen.  Assistant: Nurse  Operative findings: #1 all of graft and central venous component removed except small cuff on the arterial end  #223 cm Diatek catheter right internal jugular vein  Operative details: After obtaining informed consent, the patient was taken the operator. The patient was placed in supine position operating table. After induction of general anesthesia patient's entire right upper extremity right neck and chest were prepped and draped in usual sterile fashion. A longitudinal incision was made her pre-existing scar in the right axilla. Incision was carried down through the subtenon's tissues down to level of the pre-existing graft. This was dissected free circumferentially just above the anastomosis. The graft was then doubly ligated on its proximal end with 2-0 silk ties. It was then ligated distally with 1-0 silk tie. The graft was then transected. The graft was poorly incorporated in most segments. However to remove all of the distal segment of the graft I had to open to the existing counter incisions on the lateral aspect of the upper arm. Graft was dissected free circumferentially through these and then the graft was easily removed all the way up to the central venous component. All these wounds were thoroughly irrigated with normal saline solution. There were then closed with running 30 and 4-0 Vicryl subcuticular stitch and Dermabond applied the skin. At this point an additional incision was made at the base of the right neck through a pre-existing scar and the central venous component dissected free circumferentially and elevated up in the operative field. It was then transected and the distal segment removed and passed off the table. The proximal segment was  cannulated with an 035 J-tipped guidewire and the wire advanced down into the inferior vena cava. The remaining segment of the distal portion of the hero graft was then removed. 23 cm Diatek catheter was then brought in the operative field. The 40 French dilator peel-away sheath were placed over the guidewire down into the right atrium. The guidewire and dilator were removed. 23 cm Diatek catheter was placed through the peel-away sheath and into the right atrium. The peel-away sheath was then removed catheter was tunneled subcutaneously down to the right infraclavicular space and cut to length. The hub was attached. Catheter was checked under fluoroscopy and found with its tip to be in the right atrium. There were no kinks throughout its course. The catheter was noted to flush and draw easily. The incision at the base of the neck was closed with a running 4 Vicryl subcuticular stitch. The skin portion of the catheter was sutured with 3-0 nylon sutures as well as the skin exit site. The patient tolerated the procedure well there were no complications. Insulin sponge and needle count was correct in the case. The patient was taken to the recovery room in stable condition. Chest x-ray will be obtained in recovery room.  [name], MD [facility] [place] Office: [phone] Pager: [phone]

## 2016-01-19 NOTE — ED Notes (Signed)
Pt's son states pt was seen at [facility] this morning for central line placement and his blood pressure was elevated when he got home.  Pt states he is just tired and weak.

## 2016-01-19 NOTE — Interval H&P Note (Signed)
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 After consideration of risks, benefits and other options for treatment, the patient has consented to  Procedure(s): REMOVAL OF A HERO DEVICE (Right) INSERTION OF DIALYSIS CATHETER (N/A) as a surgical intervention .  The patient's history has been reviewed, patient examined, no change in status, stable for surgery.  I have reviewed the patient's chart and labs.  Questions were answered to the patient's satisfaction.     [name]

## 2016-01-19 NOTE — Transfer of Care (Signed)
Immediate Anesthesia Transfer of Care Note  Patient: Gerald Davenport  Procedure(s) Performed: Procedure(s): REMOVAL OF A HERO DEVICE (Right) INSERTION OF Right internal jugular  DIALYSIS CATHETER. (Right)  Patient Location: PACU  Anesthesia Type:General  Level of Consciousness: awake  Airway & Oxygen Therapy: Patient Spontanous Breathing  Post-op Assessment: Report given to RN and Post -op Vital signs reviewed and stable  Post vital signs: Reviewed and stable  Last Vitals:  Filed Vitals:   01/19/16 0645 01/19/16 0705  BP: 236/78 235/72  Pulse: 104   Temp: 37.4 C   Resp: 14     Complications: No apparent anesthesia complications

## 2016-01-21 ENCOUNTER — Telehealth: Payer: Self-pay | Admitting: Vascular Surgery

## 2016-01-21 LAB — GLUCOSE, CAPILLARY: Glucose-Capillary: 189 mg/dL — ABNORMAL HIGH (ref 65–99)

## 2016-01-21 NOTE — Telephone Encounter (Signed)
sched appt 5/12 at 8:30. Spoke to pt's wife to inform them of appt.

## 2016-01-21 NOTE — Telephone Encounter (Signed)
-----   Message from Mena Goes, RN sent at 01/21/2016  9:34 AM EDT ----- Regarding: schedule   ----- Message -----    From: Elam Dutch, MD    Sent: 01/19/2016   9:02 AM      To: Vvs Charge Pool  Removal HERO graft Place right IJ diatek Needs follow up with Dr Bridgett Larsson in 2 weeks to check hand  [name]

## 2016-01-22 ENCOUNTER — Encounter (HOSPITAL_COMMUNITY): Payer: Self-pay | Admitting: Vascular Surgery

## 2016-01-22 LAB — POCT I-STAT 4, (NA,K, GLUC, HGB,HCT)
Glucose, Bld: 281 mg/dL — ABNORMAL HIGH (ref 65–99)
HCT: 35 % — ABNORMAL LOW (ref 39.0–52.0)
HEMOGLOBIN: 11.9 g/dL — AB (ref 13.0–17.0)
POTASSIUM: 4 mmol/L (ref 3.5–5.1)
SODIUM: 136 mmol/L (ref 135–145)

## 2016-01-23 MED FILL — Oxycodone w/ Acetaminophen Tab 5-325 MG: ORAL | Qty: 6 | Status: AC

## 2016-01-30 ENCOUNTER — Encounter (HOSPITAL_COMMUNITY): Payer: Self-pay | Admitting: Vascular Surgery

## 2016-01-31 ENCOUNTER — Inpatient Hospital Stay (HOSPITAL_COMMUNITY): Admission: RE | Admit: 2016-01-31 | Payer: Medicare Other | Source: Ambulatory Visit

## 2016-02-01 ENCOUNTER — Encounter: Payer: Self-pay | Admitting: Vascular Surgery

## 2016-02-08 ENCOUNTER — Encounter: Payer: Medicare Other | Admitting: Vascular Surgery

## 2016-02-08 ENCOUNTER — Encounter: Payer: Self-pay | Admitting: Vascular Surgery

## 2016-02-14 ENCOUNTER — Ambulatory Visit (INDEPENDENT_AMBULATORY_CARE_PROVIDER_SITE_OTHER): Payer: Medicare Other | Admitting: Vascular Surgery

## 2016-02-14 ENCOUNTER — Encounter: Payer: Self-pay | Admitting: Vascular Surgery

## 2016-02-14 VITALS — BP 157/74 | HR 78 | Temp 97.3°F | Resp 18 | Ht 68.0 in | Wt 160.0 lb

## 2016-02-14 DIAGNOSIS — T82898S Other specified complication of vascular prosthetic devices, implants and grafts, sequela: Secondary | ICD-10-CM

## 2016-02-14 DIAGNOSIS — M79641 Pain in right hand: Secondary | ICD-10-CM

## 2016-02-14 DIAGNOSIS — T82898A Other specified complication of vascular prosthetic devices, implants and grafts, initial encounter: Secondary | ICD-10-CM

## 2016-02-14 NOTE — Progress Notes (Signed)
Filed Vitals:   02/14/16 0935 02/14/16 0938  BP: 153/65 157/74  Pulse: 80 78  Temp: 97.3 F (36.3 C)   Resp: 18   Height: 5\' 8"  (1.727 m)   Weight: 160 lb (72.576 kg)   SpO2: 100%

## 2016-02-14 NOTE — Progress Notes (Signed)
    Postoperative Access Visit   History of Present Illness  Gerald Davenport is a 74 y.o. year old male who presents for postoperative follow-up for: HeRO (Date: [id number]) complicated by steal syndrome leading to Removal of HeRO, RIJV TDC placement.  The patient notes numbness in right hand better, but still has some in his fingers.  He has not regained full right hand strength.  For VQI Use Only  PRE-ADM LIVING: Home  AMB STATUS: Ambulatory  Physical Examination Filed Vitals:   02/14/16 0935 02/14/16 0938  BP: 153/65 157/74  Pulse: 80 78  Temp: 97.3 F (36.3 C)   Resp: 18     RUE: Incisions are healed, hand feels cool, palpable right radial pulse, hand grip is 4/5 except for 5th finger, sensation in digits is decreased in index finger, no palpable thrill, bruit can not  be auscultated   Medical Decision Making  Gerald Davenport is a 75 y.o. year old male who presents s/p Removal of HeRO graft for severe steal sx   I have referred this patient to OT to try to recover as much function in the right hand as possible.  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  At this point, any further permanent accesses would be in his legs.  He would like defer this at this point.  Will have the pt follow up back with me in 3 months to check on the status of his R hand after OT.  Thank you for allowing [place] to participate in this patient's care.  Adele Barthel, MD [facility] [place] Office: [phone] Pager: [phone]  02/14/2016, 12:16 PM

## 2016-02-26 ENCOUNTER — Encounter (HOSPITAL_COMMUNITY): Payer: Self-pay

## 2016-02-26 ENCOUNTER — Ambulatory Visit (HOSPITAL_COMMUNITY): Payer: Medicare Other | Attending: Nephrology

## 2016-02-26 DIAGNOSIS — R29898 Other symptoms and signs involving the musculoskeletal system: Secondary | ICD-10-CM | POA: Insufficient documentation

## 2016-02-26 DIAGNOSIS — R278 Other lack of coordination: Secondary | ICD-10-CM | POA: Diagnosis present

## 2016-02-26 NOTE — Therapy (Signed)
[facility] [place], [name], [address] Phone: [phone]   Fax:  [phone]  Occupational Therapy Evaluation  Patient Details  Name: Gerald Davenport MRN: [id number] Date of Birth: [birth year] Referring Provider: Erling Cruz, MD  Encounter Date: 02/26/2016      OT End of Session - 02/26/16 1732    Visit Number 1   Number of Visits 12   Date for OT Re-Evaluation 04/08/16   Authorization Type [employer] PPO   Authorization Time Period before 10th visit   Authorization - Visit Number 1   Authorization - Number of Visits 10   OT Start Time [id number]   OT Stop Time 1600   OT Time Calculation (min) 45 min   Activity Tolerance Patient tolerated treatment well   Behavior During Therapy [facility] for tasks assessed/performed      Past Medical History  Diagnosis Date  . Hypertension   . Hyperlipidemia   . Cancer [facility])     Prostate cancer treated with radiation therapy  . Thyroid disease     hyperparathyroidism  . Anemia   . Positive PPD   . Clot 05/2006    Arterial clot right leg   . Hemodialysis patient ([place]) 6/13  . Diverticulosis   . Anxiety   . Chronic kidney disease     dialysis Mon, Wednes, Fri  . Shortness of breath dyspnea     with exertion, when he has too much fluid  . Constipation   . GERD (gastroesophageal reflux disease)   . Infection     drainage from distal incision of left upper arm arteriovenous graft  . Diabetes mellitus     Type II  . 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 Wears glasses     Past Surgical History  Procedure Laterality Date  . 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 Av fistula placement  11/04/2010    left and left  . Av fistula placement  11/20/2011    Procedure: INSERTION OF ARTERIOVENOUS (AV) GORE-TEX GRAFT ARM;  Surgeon: Angelia Mould, MD;  Location: [facility] OR;  Service: Vascular;  Laterality: Right;  . Colonoscopy N/A 11/10/2012    [id number]  proctitis/Pancolonic diverticulosis/(TUBULAR ADENOMA)5 mm polyp in the transverse segment/4 mm polyps in the base of the cecum and diminutive polyp in the descending segment. INADEQUATE BOWEL PREP  . Colonoscopy N/A 11/28/2013    RMR: Radiation proctitis. Colonic diverticulosis. Colonic polyps-removed as described above.   [name] N/A 10/08/2012    Procedure: [name];  Surgeon: Elam Dutch, MD;  Location: [facility] CATH LAB;  Service: Cardiovascular;  Laterality: N/A;  . Av fistula placement Right 04/19/2015    Procedure: Excision of infected RUA AVG;  Surgeon: Conrad [place], MD;  Location: [place];  Service: Vascular;  Laterality: Right;  . Bascilic vein transposition Left 06/19/2015    Procedure: FIRST STAGE BRACHIAL VEIN TRANSPOSITION;  Surgeon: Conrad Portal, MD;  Location: [place];  Service: Vascular;  Laterality: Left;  . Av fistula placement Left 10/04/2015    Procedure: INSERTION OF ARTERIOVENOUS (AV) GORE-TEX GRAFT ARM;  Surgeon: Conrad [place], MD;  Location: [place];  Service: Vascular;  Laterality: Left;  . Avgg removal Left 10/13/2015    Procedure: REMOVAL OF INFECTED ARTERIOVENOUS GORETEX LEFT GRAFT ;  Surgeon: Conrad [place], MD;  Location: [place];  Service: Vascular;  Laterality: Left;  . Peripheral vascular catheterization Bilateral 12/06/2015  Procedure: Upper Extremity Venography/ central venogram;  Surgeon: Conrad Rantoul, MD;  Location: [place] CV LAB;  Service: Cardiovascular;  Laterality: Bilateral;  . Vascular access device insertion Right 01/01/2016    Procedure: INSERTION OF HERO VASCULAR ACCESS DEVICE - RIGHT;  Surgeon: Conrad [place], MD;  Location: [place];  Service: Vascular;  Laterality: Right;  . Removal of a hero device Right 01/19/2016    Procedure: REMOVAL OF A HERO DEVICE;  Surgeon: Elam Dutch, MD;  Location: [place];  Service: Vascular;  Laterality: Right;  . Insertion of dialysis catheter Right 01/19/2016    Procedure: INSERTION OF Right internal jugular  DIALYSIS CATHETER.;   Surgeon: Elam Dutch, MD;  Location: [place];  Service: Vascular;  Laterality: Right;    There were no vitals filed for this visit.      Subjective Assessment - 02/26/16 1722    Subjective  S: I had surgery on my arm and now I'm unable to use my hand normal.    Pertinent History Patient is a 75 y/o male S/P stent sydrome surgery that was performed on 01/18/16. Since surgey patient has been experiencing a decrease in A/ROM, strength, and coordination as well as a hypersensitivity to touch. Dr. Florene Glen has referred patient to occupational therapy for evaluation and treatment.    Special Tests FOTO score: 39/100   Patient Stated Goals To increase use of right hand.    Currently in Pain? No/denies           [facility] OT Assessment - 02/26/16 1527    Assessment   Diagnosis Ischemic injury to left hand   Referring Provider Erling Cruz, MD   Onset Date 01/18/16  surgery   Prior Therapy None   Precautions   Precautions None   Restrictions   Weight Bearing Restrictions No   Balance Screen   Has the patient fallen in the past 6 months No   Home  Environment   Family/patient expects to be discharged to: Private residence   Living Arrangements Spouse/significant other   Prior Function   Level of Independence Independent;Independent with transfers   Vocation Retired   Leisure No leisure activities stated.    ADL   ADL comments Patient is experiencing difficulty with all fine motor tasks including opening condiment packages, buttons, and zippers. Difficulty with pinching and gripping items. Decreased strength noted with daily tasks.    Mobility   Mobility Status Independent   Written Expression   Dominant Hand Left   Vision - History   Baseline Vision No visual deficits   Cognition   Overall Cognitive Status Within Functional Limits for tasks assessed   Observation/Other Assessments   Skin Integrity Pt with dry and flaky skin noted on bilateral hands (volar and dorsal side)    Sensation   Hot/Cold Impaired by gross assessment   Additional Comments Increased sensitivity to light touch for all finger tips of right hand.    Coordination   9 Hole Peg Test Left;Right   Right 9 Hole Peg Test 1'05"   Left 9 Hole Peg Test 42.8"   Edema   Edema None   ROM / Strength   AROM / PROM / Strength AROM;PROM;Strength   Palpation   Palpation comment No fascial restrictions noted.    AROM   Overall AROM Comments Elbow, forearm, and wrist A/ROM is [facility]. Patient with slight decreased A/ROM noted in 4th and 5th digit of right hand.    AROM Assessment Site Elbow;Forearm;Wrist;Finger;Thumb  Right/Left Shoulder Right   Right/Left Finger Right   Right Composite Finger Extension --  100%   Right Composite Finger Flexion 75%   Right/Left Thumb Right   Right Thumb Opposition Digit 4   PROM   Overall PROM Comments elbow, wrist, forearm, and hand P/ROM WFL.    PROM Assessment Site Elbow;Forearm;Wrist;Finger;Thumb   Strength   Overall Strength Comments elbow, forearm and wrist strength is functional   Strength Assessment Site Elbow;Forearm;Wrist;Hand   Right/Left hand Left;Right   Right Hand Grip (lbs) 35   Right Hand Lateral Pinch 3 lbs   Right Hand 3 Point Pinch 2 lbs   Left Hand Grip (lbs) 75   Left Hand Lateral Pinch 16 lbs   Left Hand 3 Point Pinch 14 lbs                         OT Education - 02/26/16 1731    Education provided Yes   Education Details desensitizing your hand handout and A/ROM hand exercises   Person(s) Educated Patient   Methods Explanation;Demonstration;Verbal cues;Handout   Comprehension Returned demonstration;Verbalized understanding          OT Short Term Goals - 02/26/16 1737    OT SHORT TERM GOAL #1   Title Patient will be educated and independent with HEP to increase functional use of RUE during daily tasks.    Time 3   Period Weeks   Status New   OT SHORT TERM GOAL #2   Title Patient will increase A/ROM of right hand  to 100% to increase ability to grasp household items fully as needed.    Time 3   Period Weeks   Status New   OT SHORT TERM GOAL #3   Title Patient will increase grip strength by 5# and pinch strength by 3# to increase ability to do buttons when getting dressed.    Time 3   Period Weeks   Status New   OT SHORT TERM GOAL #4   Title Patient will utilize desensitization techniques to decrease hypersensitivity in right hand in order to allow him to participate in more daily activities using hand for 50% or more of the tasks.    Time 3   Period Weeks   Status New           OT Long Term Goals - 02/26/16 1741    OT LONG TERM GOAL #1   Title Patient will return to highest level of independence using RUE for 75% or more of daily tasks.    Time 6   Period Weeks   Status New   OT LONG TERM GOAL #2   Title Patient will increase grip strength by 10# and pinch strength by 5# to increase ability to open food packets.    Time 6   Period Weeks   Status New   OT LONG TERM GOAL #3   Title Patient will increase coordination by completing the 9 hole peg test in 48 seconds or less with Right hand.    Time 6   Period Weeks               Plan - 02/26/16 1733    Clinical Impression Statement Patient is a 75 y/o male S/P Steal syndrom surgery causing increased sensitivity and decreased ROM, strength and coordination abilities resulting in difficulty completing all daily and leisure tasks using RUE.    Rehab Potential Excellent   OT Frequency 2x / week  OT Duration 6 weeks   OT Treatment/Interventions Self-care/ADL training;Ultrasound;DME and/or AE instruction;Passive range of motion;Patient/family education;Cryotherapy;Parrafin;Electrical Stimulation;Moist Heat;Neuromuscular education;Therapeutic activities;Therapeutic exercises;Manual Therapy;Splinting   Plan P: Pt will benefit from skilled OT services to increase functional performance during daily tasks using RUE. Treatment Plan: Passive  stretching of digits, A/ROM exercises, grip and pinch strengthening, desensitivation techniques/education as needed, coordination activities.    Consulted and Agree with Plan of Care Patient      Patient will benefit from skilled therapeutic intervention in order to improve the following deficits and impairments:  Decreased strength, Impaired sensation, Impaired UE functional use, Decreased range of motion, Decreased coordination  Visit Diagnosis: Other symptoms and signs involving the musculoskeletal system - Plan: Ot plan of care cert/re-cert  Other lack of coordination - Plan: Ot plan of care cert/re-cert      G-Codes - [id number]    Functional Assessment Tool Used FOTO score: 39/100 (61% impaired)   Functional Limitation Carrying, moving and handling objects   Carrying, Moving and Handling Objects Current Status [id number]) At least 60 percent but less than 80 percent impaired, limited or restricted   Carrying, Moving and Handling Objects Goal Status [id number]) At least 20 percent but less than 40 percent impaired, limited or restricted      Problem List Patient Active Problem List   Diagnosis Date Noted  . 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 Infection 10/13/2015  . Infection of arteriovenous graft for hemodialysis ([place]) 10/12/2015  . ESRD on dialysis ([place]) 06/15/2015  . Diabetic neuropathy associated with type 2 diabetes mellitus ([place]) 05/25/2015  . Infection, dialysis vascular access ([place]) 04/19/2015  . Rectal bleeding 02/06/2015  . SOB (shortness of breath) 03/24/2014  . Abnormal EKG 03/24/2014  . Essential hypertension, benign 03/15/2014  . Mixed hyperlipidemia 03/15/2014  . H/O adenomatous polyp of colon 11/07/2013  . Constipation 06/15/2013  . Encounter for screening colonoscopy 11/02/2012  . Fecal incontinence 09/10/2012  . GERD (gastroesophageal reflux disease) 09/10/2012  . Diabetes mellitus 08/06/2011  . CRF (chronic renal failure)  08/06/2011    Ailene Ravel, OTR/L,CBIS  [phone]  02/26/2016, 5:46 PM  [facility] [address] [place], [name], [address] Phone: [phone]   Fax:  [phone]  Name: Gerald Davenport MRN: [id number] Date of Birth: 04-04-1941

## 2016-02-26 NOTE — Patient Instructions (Signed)
DESENSITIZING YOUR HAND  What is "desnesitization"?  Following an injury to the hand a painful increase in sensation sometimes develops.  This hypersensitivity may be in response to touch, cold temperatures or in severe cases, even to wind blowing on the hand.  This hypersensitivity may be localized to a small area or may be widespread over an entire hand.  The initial injury causing the hypersensitivity may either be a rather severe injury but may times is only a trivial injury.  Desensitization is the process of decreasing the painful hypersensitivity by gradually exposing the sensitive area to a variety of carefully selected stimuli.  What will happen if my hand is not desensitized?  Over-protecting the sensitive are by not using it or covering it with a bandage or glove will only cause the sensitive area to become more painful to touch.  This, in turn, leads to increased stiffness, weakness and loss of use in the hand.  How do I desensitize my hand?  The goal of desensitization is to gradually build up a tolerance to stronger and stronger stimuli as the level of pain decreases.  A variety of objects of different textures are used to rub against the sensitive hand in such a way that the coarseness of the textures and the rubbing time are gradually increased as the pain diminishes over a period of days and weeks.  The following steps have been set up by your hand therapist and surgeon to serve as a general guide during your therapy and to help you better understand the proper techniques involved so that ultimately a more pain-free, useful hand is obtained.  1. Warm (not hot) water soaks or heating pad on a low setting. 2. Lotion 3. Rubbing exercises:  Using your normal hand, gently rub the most hypersensitive area with a back and forth motion for 30 seconds, rest briefly, and then repeat.  As tolerance to rubbing improves, slowly increase the pressure of rubbing one hand against the  other.  Gradually work up to a deep circular massage.  Once deep massage is tolerated, advance to gently rubbing the sensitive hand against selected materials with increasingly rougher surfaces as indicated below.  It is important to begin with soft, smooth textures and gradually work up to rough, hard textures.  Suggested sequence of desensitization materials: 1)  Cotton balls  2)  Flannel shirt or cotton shirt  3)  Soft velvet or felt  4)  Pants  5)  Towel  6)  Upholstery or rug 7)  Sand paper   Submerge hand into containers of the following household materials and slowly move your fingers about, grasping the materials and releasing them, it is beneficial to submerge the hand quickly and then pull it back out... Work on increasing speed!  The following materials are recommended: cotton balls, popcorn, rice, macaroni, beans or sand.  Rub a brush along the hand.  Massage the hand with vibrating instruments such as an Copy, electric toothbrush or vibrator.  Tapping exercises: finger can be tapped against the various textures listed above.  Gradually increase the speed and force used with tapping.  To improve your fine motor coordination and to encourage normal use of the hand the following exercises are useful: 1.  Nuts and bolts 2.  Clothes pins 3.  Picking up  4.  Using a typewriter  5.  Piano  Avoid 1. Hot water.  This may increase swelling and cause more pain and stiffness. 2. Painful activities.  Desensitization may be  somewhat uncomfortable, but it should not actually be painful. 3. Over-protection. Again, covering the sensitive area or avoiding use of the painful finger or hand will only increase painful sensitivity.  Therefore, excessive pain, heat and over-protection may do more harm than good.  You should not experience increased pain and swelling following your desensitization exercises.  Your therapist or surgeon will tailor the protocol to help meet your specific  needs.  Do not hesitate to ask them any questions that you may have.  USE THE HAND AS NORMALLY AS POSSIBLE   Complete hand exercises 2-3X a day.   AROM: Finger Flexion / Extension   Actively bend fingers of right hand. Start with knuckles furthest from palm, and slowly make a fist. Hold __5__ seconds. Relax. Then straighten fingers as far as possible. Repeat __10__ times per set. Do __1__ sets per session. Do ____ sessions per day.  Copyright  VHI. All rights reserved.   Paper Crumpling Exercise   Begin with right palm down on piece of paper. Maintaining contact between surface and heel of hand, crumple paper into a ball. Repeat _10___ times per set. Do __1__ sets per session. Do ____ sessions per day.  Copyright  VHI. All rights reserved.     Towel Roll Squeeze   With right forearm resting on surface, gently squeeze towel. Repeat _10___ times per set. Do __1__ sets per session. Do ____ sessions per day.  Copyright  VHI. All rights reserved.   Abduction / Adduction (Active)    With hand flat on table, spread all fingers apart, then bring them together as close as possible. Repeat _10___ times. Do ____ sessions per day.  Copyright  VHI. All rights reserved.  AROM: Thumb Abduction / Adduction   Actively pull right thumb away from palm as far as possible. Hold _5___ seconds. Then bring thumb back to touch fingers. Try not to bend fingers toward thumb. Repeat __10__ times per set. Do ____ sets per session. Do ____ sessions per day.  Copyright  VHI. All rights reserved.

## 2016-02-27 ENCOUNTER — Ambulatory Visit (HOSPITAL_COMMUNITY): Payer: Medicare Other | Admitting: Occupational Therapy

## 2016-02-27 ENCOUNTER — Encounter (HOSPITAL_COMMUNITY): Payer: Self-pay | Admitting: Occupational Therapy

## 2016-02-27 DIAGNOSIS — R29898 Other symptoms and signs involving the musculoskeletal system: Secondary | ICD-10-CM

## 2016-02-27 DIAGNOSIS — R278 Other lack of coordination: Secondary | ICD-10-CM

## 2016-02-27 NOTE — Therapy (Signed)
[place], [name], [address] Phone: [phone]   Fax:  [phone]  Occupational Therapy Treatment  Patient Details  Name: Gerald Davenport MRN: [id number] Date of Birth: Oct 07, 1940 Referring Provider: Erling Cruz, MD  Encounter Date: 02/27/2016      OT End of Session - 02/27/16 1457    Visit Number 2   Number of Visits 12   Date for OT Re-Evaluation 04/08/16   Authorization Type [employer] advantage PPO   Authorization Time Period before 10th visit   Authorization - Visit Number 2   Authorization - Number of Visits 10   OT Start Time [id number]   OT Stop Time 1420   OT Time Calculation (min) 43 min   Activity Tolerance Patient tolerated treatment well   Behavior During Therapy [facility] for tasks assessed/performed      Past Medical History  Diagnosis Date  . Hypertension   . Hyperlipidemia   . Cancer [facility])     Prostate cancer treated with radiation therapy  . Thyroid disease     hyperparathyroidism  . Anemia   . Positive PPD   . Clot 05/2006    Arterial clot right leg   . Hemodialysis patient ([place]) 6/13  . Diverticulosis   . Anxiety   . Chronic kidney disease     dialysis Mon, Wednes, Fri  . Shortness of breath dyspnea     with exertion, when he has too much fluid  . Constipation   . GERD (gastroesophageal reflux disease)   . Infection     drainage from distal incision of left upper arm arteriovenous graft  . Diabetes mellitus     Type II  . Steal syndrome dialysis vascular access ([place])   . Wears glasses     Past Surgical History  Procedure Laterality Date  . 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 Av fistula placement  11/04/2010    left and left  . Av fistula placement  11/20/2011    Procedure: INSERTION OF ARTERIOVENOUS (AV) GORE-TEX GRAFT ARM;  Surgeon: Angelia Mould, MD;  [facility];  Service: Vascular;  Laterality: Right;  . Colonoscopy N/A 11/10/2012    [id number]  proctitis/Pancolonic diverticulosis/(TUBULAR ADENOMA)5 mm polyp in the transverse segment/4 mm polyps in the base of the cecum and diminutive polyp in the descending segment. INADEQUATE BOWEL PREP  . Colonoscopy N/A 11/28/2013    RMR: Radiation proctitis. Colonic diverticulosis. Colonic polyps-removed as described above.   [name] N/A 10/08/2012    Procedure: [name];  Surgeon: Elam Dutch, MD;  Location: [facility] CATH LAB;  Service: Cardiovascular;  Laterality: N/A;  . Av fistula placement Right 04/19/2015    Procedure: Excision of infected RUA AVG;  Surgeon: Conrad Zeeland, MD;  Location: [place];  Service: Vascular;  Laterality: Right;  . Bascilic vein transposition Left 06/19/2015    Procedure: FIRST STAGE BRACHIAL VEIN TRANSPOSITION;  Surgeon: Conrad Marathon, MD;  Location: [place];  Service: Vascular;  Laterality: Left;  . Av fistula placement Left 10/04/2015    Procedure: INSERTION OF ARTERIOVENOUS (AV) GORE-TEX GRAFT ARM;  Surgeon: Conrad Grangeville, MD;  Location: [place];  Service: Vascular;  Laterality: Left;  . Avgg removal Left 10/13/2015    Procedure: REMOVAL OF INFECTED ARTERIOVENOUS GORETEX LEFT GRAFT ;  Surgeon: Conrad [place], MD;  Location: [place];  Service: Vascular;  Laterality: Left;  . Peripheral vascular catheterization Bilateral 12/06/2015  Procedure: Upper Extremity Venography/ central venogram;  Surgeon: Conrad Oriskany, MD;  Location: [place] CV LAB;  Service: Cardiovascular;  Laterality: Bilateral;  . Vascular access device insertion Right 01/01/2016    Procedure: INSERTION OF HERO VASCULAR ACCESS DEVICE - RIGHT;  Surgeon: Conrad Blunt, MD;  Location: [place];  Service: Vascular;  Laterality: Right;  . Removal of a hero device Right 01/19/2016    Procedure: REMOVAL OF A HERO DEVICE;  Surgeon: Elam Dutch, MD;  Location: [place];  Service: Vascular;  Laterality: Right;  . Insertion of dialysis catheter Right 01/19/2016    Procedure: INSERTION OF Right internal jugular  DIALYSIS CATHETER.;   Surgeon: Elam Dutch, MD;  Location: [place];  Service: Vascular;  Laterality: Right;    There were no vitals filed for this visit.      Subjective Assessment - 02/27/16 1412    Subjective  S: I tried a few of those things last night. (Desensitization exercises)   Currently in Pain? No/denies            [facility] OT Assessment - 02/27/16 1412    Assessment   Diagnosis Ischemic injury to left hand   Precautions   Precautions None                  OT Treatments/Exercises (OP) - 02/27/16 1413    Exercises   Exercises Hand;Wrist   Wrist Exercises   Forearm Supination PROM;10 reps   Forearm Pronation PROM;10 reps   Wrist Flexion PROM;AROM;10 reps   Wrist Extension PROM;AROM;10 reps   Wrist Radial Deviation AROM;10 reps   Wrist Ulnar Deviation AROM;10 reps   Additional Wrist Exercises   Hand Gripper with Large Beads 6/6 gripper set to 15#   Hand Gripper with Medium Beads 12/12 gripper set to 15#   Hand Gripper with Small Beads 17/17 gripper set to 15#  2 rest breaks   Hand Exercises   MCPJ Flexion PROM;5 reps   MCPJ Extension PROM;5 reps   PIPJ Extension PROM;5 reps   DIPJ Extension PROM;5 reps   Other Hand Exercises Towel crumple, 10X   Other Hand Exercises Digit composite flexion & extension 10X   Fine Motor Coordination   Fine Motor Coordination Small Pegboard   Small Pegboard Pt completed small pegboard task, placing pegs with right hand index finger and thumb. Pt with max difficulty, able to place 16 pegs in 10 minutes                OT Education - 02/26/16 1731    Education provided Yes   Education Details desensitizing your hand handout and A/ROM hand exercises   Person(s) Educated Patient   Methods Explanation;Demonstration;Verbal cues;Handout   Comprehension Returned demonstration;Verbalized understanding          OT Short Term Goals - 02/27/16 1458    OT SHORT TERM GOAL #1   Title Patient will be educated and independent with HEP  to increase functional use of RUE during daily tasks.    Time 3   Period Weeks   Status On-going   OT SHORT TERM GOAL #2   Title Patient will increase A/ROM of right hand to 100% to increase ability to grasp household items fully as needed.    Time 3   Period Weeks   Status On-going   OT SHORT TERM GOAL #3   Title Patient will increase grip strength by 5# and pinch strength by 3# to increase ability to do buttons  when getting dressed.    Time 3   Period Weeks   Status On-going   OT SHORT TERM GOAL #4   Title Patient will utilize desensitization techniques to decrease hypersensitivity in right hand in order to allow him to participate in more daily activities using hand for 50% or more of the tasks.    Time 3   Period Weeks   Status On-going           OT Long Term Goals - 02/27/16 1458    OT LONG TERM GOAL #1   Title Patient will return to highest level of independence using RUE for 75% or more of daily tasks.    Time 6   Period Weeks   Status On-going   OT LONG TERM GOAL #2   Title Patient will increase grip strength by 10# and pinch strength by 5# to increase ability to open food packets.    Time 6   Period Weeks   Status On-going   OT LONG TERM GOAL #3   Title Patient will increase coordination by completing the 9 hole peg test in 48 seconds or less with Right hand.    Time 6   Period Weeks   Status On-going               Plan - 02/27/16 1458    Clinical Impression Statement A: Initiated P/ROM and A/ROM, grip strengthening, and fine motor coordination activities this session. Pt with mod difficulty using gripper, max difficulty with fine motor coordination task. Pt brought in HEP from evaluation, reviewed with pt.    Rehab Potential Excellent   OT Frequency 2x / week   OT Duration 6 weeks   OT Treatment/Interventions Self-care/ADL training;Ultrasound;DME and/or AE instruction;Passive range of motion;Patient/family education;Cryotherapy;Parrafin;Electrical  Stimulation;Moist Heat;Neuromuscular education;Therapeutic activities;Therapeutic exercises;Manual Therapy;Splinting   Plan P: Add pinch strengthening task using resistive clothespins and sponges   Consulted and Agree with Plan of Care Patient      Patient will benefit from skilled therapeutic intervention in order to improve the following deficits and impairments:  Decreased strength, Impaired sensation, Impaired UE functional use, Decreased range of motion, Decreased coordination  Visit Diagnosis: Other symptoms and signs involving the musculoskeletal system  Other lack of coordination      G-Codes - 03/10/16 1742    Functional Assessment Tool Used FOTO score: 39/100 (61% impaired)   Functional Limitation Carrying, moving and handling objects   Carrying, Moving and Handling Objects Current Status [id number]) At least 60 percent but less than 80 percent impaired, limited or restricted   Carrying, Moving and Handling Objects Goal Status [id number]) At least 20 percent but less than 40 percent impaired, limited or restricted      Problem List Patient Active Problem List   Diagnosis Date Noted  . 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 Infection 10/13/2015  . Infection of arteriovenous graft for hemodialysis ([place]) 10/12/2015  . ESRD on dialysis ([place]) 06/15/2015  . Diabetic neuropathy associated with type 2 diabetes mellitus ([place]) 05/25/2015  . Infection, dialysis vascular access ([place]) 04/19/2015  . Rectal bleeding 02/06/2015  . SOB (shortness of breath) 03/24/2014  . Abnormal EKG 03/24/2014  . Essential hypertension, benign 03/15/2014  . Mixed hyperlipidemia 03/15/2014  . H/O adenomatous polyp of colon 11/07/2013  . Constipation 06/15/2013  . Encounter for screening colonoscopy 11/02/2012  . Fecal incontinence 09/10/2012  . GERD (gastroesophageal reflux disease) 09/10/2012  . Diabetes mellitus 08/06/2011  . CRF (chronic renal failure) 08/06/2011  Guadelupe Sabin, OTR/L  [phone]  02/27/2016, 3:01 PM  [place] [address] [place], [name], [address] Phone: [phone]   Fax:  [phone]  Name: AUGUSTEN TESFAY MRN: [id number] Date of Birth: 06-24-1941

## 2016-02-28 ENCOUNTER — Encounter (HOSPITAL_COMMUNITY): Payer: Medicare Other

## 2016-03-05 ENCOUNTER — Ambulatory Visit (HOSPITAL_COMMUNITY): Payer: Medicare Other | Attending: Nephrology | Admitting: Occupational Therapy

## 2016-03-05 ENCOUNTER — Encounter (HOSPITAL_COMMUNITY): Payer: Self-pay | Admitting: Occupational Therapy

## 2016-03-05 DIAGNOSIS — R278 Other lack of coordination: Secondary | ICD-10-CM | POA: Diagnosis present

## 2016-03-05 DIAGNOSIS — R29898 Other symptoms and signs involving the musculoskeletal system: Secondary | ICD-10-CM

## 2016-03-05 NOTE — Therapy (Signed)
[place], [name], [address] Phone: [phone]   Fax:  [phone]  Occupational Therapy Treatment  Patient Details  Name: Gerald Davenport MRN: [id number] Date of Birth: 1941-08-22 Referring Provider: Erling Cruz, MD  Encounter Date: 03/05/2016      OT End of Session - 03/05/16 1412    Visit Number 3   Number of Visits 12   Date for OT Re-Evaluation 04/08/16   Authorization Type [employer] Med[employer] advantage PPO   Authorization Time Period before 10th visit   Authorization - Visit Number 3   Authorization - Number of Visits 10   OT Start Time 1301   OT Stop Time 1343   OT Time Calculation (min) 42 min   Activity Tolerance Patient tolerated treatment well   Behavior During Therapy [facility] for tasks assessed/performed      Past Medical History  Diagnosis Date  . Hypertension   . Hyperlipidemia   . Cancer [facility])     Prostate cancer treated with radiation therapy  . Thyroid disease     hyperparathyroidism  . Anemia   . Positive PPD   . Clot 05/2006    Arterial clot right leg   . Hemodialysis patient ([place]) 6/13  . Diverticulosis   . Anxiety   . Chronic kidney disease     dialysis Mon, Wednes, Fri  . Shortness of breath dyspnea     with exertion, when he has too much fluid  . Constipation   . GERD (gastroesophageal reflux disease)   . Infection     drainage from distal incision of left upper arm arteriovenous graft  . Diabetes mellitus     Type II  . Steal syndrome dialysis vascular access ([place])   . Wears glasses     Past Surgical History  Procedure Laterality Date  . 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 Av fistula placement  11/04/2010    left and left  . Av fistula placement  11/20/2011    Procedure: INSERTION OF ARTERIOVENOUS (AV) GORE-TEX GRAFT ARM;  Surgeon: Angelia Mould, MD;  [facility];  Service: Vascular;  Laterality: Right;  . Colonoscopy N/A 11/10/2012    [id number]  proctitis/Pancolonic diverticulosis/(TUBULAR ADENOMA)5 mm polyp in the transverse segment/4 mm polyps in the base of the cecum and diminutive polyp in the descending segment. INADEQUATE BOWEL PREP  . Colonoscopy N/A 11/28/2013    RMR: Radiation proctitis. Colonic diverticulosis. Colonic polyps-removed as described above.   [name] N/A 10/08/2012    Procedure: [name];  Surgeon: Elam Dutch, MD;  Location: [facility] CATH LAB;  Service: Cardiovascular;  Laterality: N/A;  . Av fistula placement Right 04/19/2015    Procedure: Excision of infected RUA AVG;  Surgeon: Conrad Michie, MD;  Location: [place];  Service: Vascular;  Laterality: Right;  . Bascilic vein transposition Left 06/19/2015    Procedure: FIRST STAGE BRACHIAL VEIN TRANSPOSITION;  Surgeon: Conrad [place]ron, MD;  Location: [place];  Service: Vascular;  Laterality: Left;  . Av fistula placement Left 10/04/2015    Procedure: INSERTION OF ARTERIOVENOUS (AV) GORE-TEX GRAFT ARM;  Surgeon: Conrad Panola, MD;  Location: [place];  Service: Vascular;  Laterality: Left;  . Avgg removal Left 10/13/2015    Procedure: REMOVAL OF INFECTED ARTERIOVENOUS GORETEX LEFT GRAFT ;  Surgeon: Conrad [place]omo, MD;  Location: [place];  Service: Vascular;  Laterality: Left;  . Peripheral vascular catheterization Bilateral 12/06/2015  Procedure: Upper Extremity Venography/ central venogram;  Surgeon: Conrad Gunnison, MD;  Location: [place] CV LAB;  Service: Cardiovascular;  Laterality: Bilateral;  . Vascular access device insertion Right 01/01/2016    Procedure: INSERTION OF HERO VASCULAR ACCESS DEVICE - RIGHT;  Surgeon: Conrad [place], MD;  Location: [place];  Service: Vascular;  Laterality: Right;  . Removal of a hero device Right 01/19/2016    Procedure: REMOVAL OF A HERO DEVICE;  Surgeon: Elam Dutch, MD;  Location: [place];  Service: Vascular;  Laterality: Right;  . Insertion of dialysis catheter Right 01/19/2016    Procedure: INSERTION OF Right internal jugular  DIALYSIS CATHETER.;   Surgeon: Elam Dutch, MD;  Location: [place];  Service: Vascular;  Laterality: Right;    There were no vitals filed for this visit.      Subjective Assessment - 03/05/16 1302    Subjective  S: I feel like it's getting some better.    Currently in Pain? Yes   Pain Score 5    Pain Location Hand   Pain Orientation Right   Pain Descriptors / Indicators Burning   Pain Type Acute pain   Pain Radiating Towards n/a   Pain Onset 1 to 4 weeks ago   Pain Frequency Intermittent   Aggravating Factors  use   Pain Relieving Factors nothing   Effect of Pain on Daily Activities limited abiliy to use during daily tasks.   Multiple Pain Sites No            [facility] OT Assessment - 03/05/16 1301    Assessment   Diagnosis Ischemic injury to right hand   Precautions   Precautions None                  OT Treatments/Exercises (OP) - 03/05/16 1306    Exercises   Exercises Hand;Wrist   Wrist Exercises   Forearm Supination PROM;5 reps   Forearm Pronation PROM;5 reps   Wrist Flexion PROM;5 reps   Wrist Extension PROM;5 reps   Wrist Radial Deviation PROM;5 reps   Wrist Ulnar Deviation PROM;5 reps   Additional Wrist Exercises   Sponges Pt used yellow resistive clothespin to grasp high resistance sponges and place into bucket. Pt attempted with red clothespin, able to place one sponge with increased time, then returned to yellow clothespin. Pt utilized 3 point pinch for task, increased time required for placing 25 sponges.    Hand Gripper with Large Beads 6/6 gripper set to 15#   Hand Gripper with Medium Beads 12/12 gripper set to 15#   Hand Gripper with Small Beads 17/17 gripper set to 15#   Hand Exercises   MCPJ Flexion PROM;5 reps   MCPJ Extension PROM;5 reps   PIPJ Extension PROM;5 reps   DIPJ Extension PROM;5 reps   Other Hand Exercises Towel crumple, 10X   Other Hand Exercises Digit composite flexion & extension 10X   Fine Motor Coordination   Fine Motor Coordination  Flipping cards;Picking up coins;Manipulating coins   Flipping cards Pt held deck of cards with left hand, flipping cards using right thumb and index finger. Mod difficulty grasping and flipping cards.    Picking up coins Pt attempted to pick up coins from tabletop using right thumb and index finger, unable to pick up coins. Pt then slid coins to edge of table to grasp with thumb and index finger. Min difficulty with this task.   Manipulating coins Pt attempted to complete palm to fingertip translation  this session. Unable to complete with multiple coins in the palm. Able to complete one at a time with max difficulty                  OT Short Term Goals - 02/27/16 1458    OT SHORT TERM GOAL #1   Title Patient will be educated and independent with HEP to increase functional use of RUE during daily tasks.    Time 3   Period Weeks   Status On-going   OT SHORT TERM GOAL #2   Title Patient will increase A/ROM of right hand to 100% to increase ability to grasp household items fully as needed.    Time 3   Period Weeks   Status On-going   OT SHORT TERM GOAL #3   Title Patient will increase grip strength by 5# and pinch strength by 3# to increase ability to do buttons when getting dressed.    Time 3   Period Weeks   Status On-going   OT SHORT TERM GOAL #4   Title Patient will utilize desensitization techniques to decrease hypersensitivity in right hand in order to allow him to participate in more daily activities using hand for 50% or more of the tasks.    Time 3   Period Weeks   Status On-going           OT Long Term Goals - 02/27/16 1458    OT LONG TERM GOAL #1   Title Patient will return to highest level of independence using RUE for 75% or more of daily tasks.    Time 6   Period Weeks   Status On-going   OT LONG TERM GOAL #2   Title Patient will increase grip strength by 10# and pinch strength by 5# to increase ability to open food packets.    Time 6   Period Weeks    Status On-going   OT LONG TERM GOAL #3   Title Patient will increase coordination by completing the 9 hole peg test in 48 seconds or less with Right hand.    Time 6   Period Weeks   Status On-going               Plan - 03/05/16 1412    Clinical Impression Statement A: Continued grip strengthening, added pinch strengthening task with resistive clothespin and sponges. Pt with max difficulty with fine motor coordination activities this session, requiring consistent verbal cuing and visual demonstration. Pt with mod fatigue after clothespin task.    Rehab Potential Excellent   OT Frequency 2x / week   OT Duration 6 weeks   OT Treatment/Interventions Self-care/ADL training;Ultrasound;DME and/or AE instruction;Passive range of motion;Patient/family education;Cryotherapy;Parrafin;Electrical Stimulation;Moist Heat;Neuromuscular education;Therapeutic activities;Therapeutic exercises;Manual Therapy;Splinting   Plan P: Continue working on grip and pinch strengthening, attempt to increase gripper to 18#      Patient will benefit from skilled therapeutic intervention in order to improve the following deficits and impairments:  Decreased strength, Impaired sensation, Impaired UE functional use, Decreased range of motion, Decreased coordination  Visit Diagnosis: Other symptoms and signs involving the musculoskeletal system  Other lack of coordination    Problem List Patient Active Problem List   Diagnosis Date Noted  . Steal syndrome of dialysis vascular access ([place]) 01/18/2016  . Infection 10/13/2015  . Infection of arteriovenous graft for hemodialysis ([place]) 10/12/2015  . ESRD on dialysis ([place]) 06/15/2015  . Diabetic neuropathy associated with type 2 diabetes mellitus ([place]) 05/25/2015  . Infection, dialysis vascular  access ([place]) 04/19/2015  . Rectal bleeding 02/06/2015  . SOB (shortness of breath) 03/24/2014  . Abnormal EKG 03/24/2014  . Essential hypertension, benign 03/15/2014  .  Mixed hyperlipidemia 03/15/2014  . H/O adenomatous polyp of colon 11/07/2013  . Constipation 06/15/2013  . Encounter for screening colonoscopy 11/02/2012  . Fecal incontinence 09/10/2012  . GERD (gastroesophageal reflux disease) 09/10/2012  . Diabetes mellitus 08/06/2011  . CRF (chronic renal failure) 08/06/2011    Guadelupe Sabin, OTR/L  [phone]  03/05/2016, 2:16 PM  [facility] [address] [place], [name], [address] Phone: [phone]   Fax:  [phone]  Name: Gerald Davenport MRN: [id number] Date of Birth: August 07, 1941

## 2016-03-11 ENCOUNTER — Emergency Department (HOSPITAL_COMMUNITY)
Admission: EM | Admit: 2016-03-11 | Discharge: 2016-03-12 | Disposition: A | Discharge status: Home / Self Care | Payer: Medicare Other | Attending: Emergency Medicine | Admitting: Emergency Medicine

## 2016-03-11 ENCOUNTER — Ambulatory Visit (HOSPITAL_COMMUNITY): Payer: Medicare Other

## 2016-03-11 ENCOUNTER — Encounter (HOSPITAL_COMMUNITY): Payer: Self-pay | Admitting: *Deleted

## 2016-03-11 DIAGNOSIS — Z8546 Personal history of malignant neoplasm of prostate: Secondary | ICD-10-CM | POA: Diagnosis not present

## 2016-03-11 DIAGNOSIS — Z79899 Other long term (current) drug therapy: Secondary | ICD-10-CM | POA: Diagnosis not present

## 2016-03-11 DIAGNOSIS — E785 Hyperlipidemia, unspecified: Secondary | ICD-10-CM | POA: Insufficient documentation

## 2016-03-11 DIAGNOSIS — Z7982 Long term (current) use of aspirin: Secondary | ICD-10-CM | POA: Insufficient documentation

## 2016-03-11 DIAGNOSIS — L7622 Postprocedural hemorrhage and hematoma of skin and subcutaneous tissue following other procedure: Secondary | ICD-10-CM | POA: Diagnosis not present

## 2016-03-11 DIAGNOSIS — F17210 Nicotine dependence, cigarettes, uncomplicated: Secondary | ICD-10-CM | POA: Insufficient documentation

## 2016-03-11 DIAGNOSIS — Z794 Long term (current) use of insulin: Secondary | ICD-10-CM | POA: Insufficient documentation

## 2016-03-11 DIAGNOSIS — N189 Chronic kidney disease, unspecified: Secondary | ICD-10-CM | POA: Diagnosis not present

## 2016-03-11 DIAGNOSIS — I129 Hypertensive chronic kidney disease with stage 1 through stage 4 chronic kidney disease, or unspecified chronic kidney disease: Secondary | ICD-10-CM | POA: Insufficient documentation

## 2016-03-11 DIAGNOSIS — E119 Type 2 diabetes mellitus without complications: Secondary | ICD-10-CM | POA: Diagnosis not present

## 2016-03-11 MED ORDER — "THROMBI-PAD 3""X3"" EX PADS"
1.0000 | MEDICATED_PAD | Freq: Once | CUTANEOUS | Status: AC
  Administered 2016-03-11: 1 via TOPICAL
  Filled 2016-03-11: qty 1

## 2016-03-11 NOTE — ED Notes (Signed)
Thrombi pad placed around dialysis cath insertion site, pt tolerated well,

## 2016-03-11 NOTE — ED Notes (Signed)
Bleeding from dialysis access site, Had catheter placed this am and had dialysis after placement. Noted bleeding this pm

## 2016-03-11 NOTE — ED Provider Notes (Signed)
CSN: [id number]     Arrival date & time 03/11/16  2103 History  By signing my name below, I, Gerald Davenport, attest that this documentation has been prepared under the direction and in the presence of [name], MD . Electronically Signed: Higinio Davenport, Scribe. 03/11/2016. 11:40 PM.  Chief Complaint  Patient presents with  . Post-op Problem   The history is provided by the patient. No language interpreter was used.    HPI Comments:  Gerald Davenport is a 75 y.o. male with PMHx of HTN, HLD, CA, DM, and infection, who presents to the Emergency Department complaining of gradually worsening, moderate, bleeding from his right pectoral muscle that began this evening. He reports he had a catheter placed for dialysis treatment from this same site previously. Pt states he took Dilaudid earlier today and Aspirin yesterday as well. He denies any other complaints.   Past Medical History  Diagnosis Date  . Hypertension   . Hyperlipidemia   . Cancer [facility])     Prostate cancer treated with radiation therapy  . Thyroid disease     hyperparathyroidism  . Anemia   . Positive PPD   . Clot 05/2006    Arterial clot right leg   . Hemodialysis patient ([place]) 6/13  . Diverticulosis   . Anxiety   . Chronic kidney disease     dialysis Mon, Wednes, Fri  . Shortness of breath dyspnea     with exertion, when he has too much fluid  . Constipation   . GERD (gastroesophageal reflux disease)   . Infection     drainage from distal incision of left upper arm arteriovenous graft  . Diabetes mellitus     Type II  . 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 Wears glasses    Past Surgical History  Procedure Laterality Date  . 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 Av fistula placement  11/04/2010    left and left  . Av fistula placement  11/20/2011    Procedure: INSERTION OF ARTERIOVENOUS (AV) GORE-TEX GRAFT ARM;  Surgeon: Angelia Mould, MD;  [facility];  Service: Vascular;  Laterality: Right;  .  Colonoscopy N/A 11/10/2012    [id number] proctitis/Pancolonic diverticulosis/(TUBULAR ADENOMA)5 mm polyp in the transverse segment/4 mm polyps in the base of the cecum and diminutive polyp in the descending segment. INADEQUATE BOWEL PREP  . Colonoscopy N/A 11/28/2013    RMR: Radiation proctitis. Colonic diverticulosis. Colonic polyps-removed as described above.   [name] N/A 10/08/2012    Procedure: [name];  Surgeon: Elam Dutch, MD;  Location: [facility] CATH LAB;  Service: Cardiovascular;  Laterality: N/A;  . Av fistula placement Right 04/19/2015    Procedure: Excision of infected RUA AVG;  Surgeon: Conrad Helena, MD;  Location: [place];  Service: Vascular;  Laterality: Right;  . Bascilic vein transposition Left 06/19/2015    Procedure: FIRST STAGE BRACHIAL VEIN TRANSPOSITION;  Surgeon: Conrad Fisher, MD;  Location: [place];  Service: Vascular;  Laterality: Left;  . Av fistula placement Left 10/04/2015    Procedure: INSERTION OF ARTERIOVENOUS (AV) GORE-TEX GRAFT ARM;  Surgeon: Conrad Moyock, MD;  Location: [place];  Service: Vascular;  Laterality: Left;  . Avgg removal Left 10/13/2015    Procedure: REMOVAL OF INFECTED ARTERIOVENOUS GORETEX LEFT GRAFT ;  Surgeon: Conrad Beardsley, MD;  Location: [place];  Service: Vascular;  Laterality: Left;  . Peripheral vascular catheterization Bilateral 12/06/2015  Procedure: Upper Extremity Venography/ central venogram;  Surgeon: Conrad Rhome, MD;  Location: [place] CV LAB;  Service: Cardiovascular;  Laterality: Bilateral;  . Vascular access device insertion Right 01/01/2016    Procedure: INSERTION OF HERO VASCULAR ACCESS DEVICE - RIGHT;  Surgeon: Conrad Winnebago, MD;  Location: [place];  Service: Vascular;  Laterality: Right;  . Removal of a hero device Right 01/19/2016    Procedure: REMOVAL OF A HERO DEVICE;  Surgeon: Elam Dutch, MD;  Location: [place];  Service: Vascular;  Laterality: Right;  . Insertion of dialysis catheter Right 01/19/2016    Procedure:  INSERTION OF Right internal jugular  DIALYSIS CATHETER.;  Surgeon: Elam Dutch, MD;  Location: [facility] OR;  Service: Vascular;  Laterality: Right;   Family History  Problem Relation Age of Onset  . Heart disease Mother   . Diabetes Mother   . Hypertension Mother   . Stroke Mother   . Heart disease Father   . Diabetes Father   . Heart disease Sister   . Diabetes Brother   . Colon cancer Neg Hx    Social History  Substance Use Topics  . Smoking status: Light Tobacco Smoker -- 0.25 packs/day for 50 years    Types: Cigarettes  . Smokeless tobacco: Never Used     Comment: 4-5 cigarettes daily  . Alcohol Use: No    Review of Systems  Constitutional: Negative for fever.  Skin: Positive for wound.  All other systems reviewed and are negative.  Allergies  Review of patient's allergies indicates no known allergies.  Home Medications   Prior to Admission medications   Medication Sig Start Date End Date Taking? Authorizing Provider  furosemide (LASIX) 40 MG tablet Take 40 mg by mouth daily.   Yes Historical Provider, MD  gabapentin (NEURONTIN) 100 MG capsule Take 100 mg by mouth 2 (two) times daily. 04/13/15  Yes Historical Provider, MD  insulin detemir (LEVEMIR) 100 UNIT/ML injection Inject 25 Units into the skin at bedtime.    Yes Historical Provider, MD  Linaclotide Rolan Lipa) 145 MCG CAPS capsule Take 1 capsule (145 mcg total) by mouth daily. Take 30 minutes before breakfast Patient taking differently: Take 145 mcg by mouth See admin instructions. Take 30 minutes before breakfast on Sunday, Tuesday, Thursday, Saturday (non-dialysis days) 02/06/15  Yes Carlis Stable, NP  QUEtiapine (SEROQUEL) 100 MG tablet Take 100 mg by mouth at bedtime. 03/05/15  Yes Historical Provider, MD  saxagliptin HCl (ONGLYZA) 5 MG TABS tablet Take 5 mg by mouth daily with breakfast.    Yes Historical Provider, MD  SENSIPAR 30 MG tablet Take 1 tablet by mouth daily. 11/20/15  Yes Historical Provider, MD   simvastatin (ZOCOR) 20 MG tablet Take 20 mg by mouth daily.   Yes Historical Provider, MD  acetaminophen-codeine (TYLENOL #3) 300-30 MG tablet Take 1 tablet by mouth every 4 (four) hours as needed for moderate pain. Patient not taking: Reported on 02/26/2016 01/19/16   Elam Dutch, MD  aspirin EC 81 MG tablet Take 81-162 mg by mouth 2 (two) times daily. Take 2 tablets (162 mg) by mouth every morning and 1 tablet (81 mg) at night    Historical Provider, MD  buPROPion (WELLBUTRIN XL) 150 MG 24 hr tablet Take 150 mg by mouth daily. 03/05/15   Historical Provider, MD  calcium acetate (PHOSLO) 667 MG capsule Take 667-1,334 mg by mouth See admin instructions. Take 2 capsules (1334 mg) by mouth with breakfast and with supper,  take 1 capsule (667 mg) with snacks or small meals 10/26/12   Historical Provider, MD  cloNIDine (CATAPRES) 0.2 MG tablet Take 1 tablet (0.2 mg total) by mouth 3 (three) times daily. 01/19/16   [name], MD  doxazosin (CARDURA) 8 MG tablet Take 8 mg by mouth daily. Reported on 02/26/2016    Historical Provider, MD  omeprazole (PRILOSEC) 20 MG capsule Take 20 mg by mouth 2 (two) times daily before a meal.  02/20/15   Historical Provider, MD  oxyCODONE-acetaminophen (PERCOCET/ROXICET) 5-325 MG tablet Take 1 tablet by mouth every 6 (six) hours as needed. 01/18/16   Kristen N Ward, DO  Polyvinyl Alcohol-Povidone (REFRESH OP) Place 1 drop into both eyes at bedtime.    Historical Provider, MD  Vancomycin (VANCOCIN) 750 MG/150ML SOLN Inject 150 mLs (750 mg total) into the vein every Monday, Wednesday, and Friday with hemodialysis. 10/17/15   Joelene Millin A Trinh, PA-C   BP 187/94 mmHg  Pulse 102  Temp(Src) 98.9 F (37.2 C) (Oral)  Resp 18  Ht 5\' 9"  (1.753 m)  Wt 165 lb (74.844 kg)  BMI 24.36 kg/m2  SpO2 100% Physical Exam  Constitutional: He is oriented to person, place, and time. He appears well-developed and well-nourished.  HENT:  Head: Normocephalic and atraumatic.  Eyes: EOM are  normal. Pupils are equal, round, and reactive to light. Right eye exhibits no discharge. Left eye exhibits no discharge.  Neck: Normal range of motion. Neck supple. No JVD present.  Cardiovascular: Normal rate and regular rhythm.   No murmur heard. Pulmonary/Chest: Effort normal and breath sounds normal. He has no wheezes. He has no rales.  Dialysis access site: catheter placed right subclavian with slight oozing from insertion site  Abdominal: Soft. Bowel sounds are normal. He exhibits no distension and no mass. There is no tenderness.  Musculoskeletal: Normal range of motion. He exhibits no edema.  Right below the knee amputation  Lymphadenopathy:    He has no cervical adenopathy.  Neurological: He is alert and oriented to person, place, and time. No cranial nerve deficit. He exhibits normal muscle tone. Coordination normal.  Skin: Skin is warm and dry. No rash noted.  Psychiatric: He has a normal mood and affect. His behavior is normal. Judgment and thought content normal.  Nursing note and vitals reviewed.  ED Course  Procedures  DIAGNOSTIC STUDIES:  Oxygen Saturation is 100% on RA, normal by my interpretation.    COORDINATION OF CARE:  11:37 PM Discussed treatment Davenport, which includes thrombi-pad application at bedside and pt agreed to Davenport.    MDM   Final diagnoses:  Postoperative hemorrhage of subcutaneous tissue following non-dermatologic procedure    Slow oozing from insertion site of dialysis access catheter. Thrombi-Pad was applied with control of bleeding. Patient has dialysis scheduled for 5 AM and is discharged with instructions to go to his scheduled dialysis session.  I personally performed the services described in this documentation, which was scribed in my presence. The recorded information has been reviewed and is accurate.       [name], MD [phone]

## 2016-03-12 ENCOUNTER — Encounter (HOSPITAL_COMMUNITY): Payer: Self-pay | Admitting: Occupational Therapy

## 2016-03-12 ENCOUNTER — Ambulatory Visit (HOSPITAL_COMMUNITY): Payer: Medicare Other | Admitting: Occupational Therapy

## 2016-03-12 DIAGNOSIS — R29898 Other symptoms and signs involving the musculoskeletal system: Secondary | ICD-10-CM

## 2016-03-12 DIAGNOSIS — R278 Other lack of coordination: Secondary | ICD-10-CM

## 2016-03-12 NOTE — Discharge Instructions (Signed)
Go for your dialysis in the morning, as scheduled.

## 2016-03-12 NOTE — Therapy (Addendum)
[place], [name], [address] Phone: [phone]   Fax:  [phone]  Occupational Therapy Treatment  Patient Details  Name: Gerald Davenport MRN: [id number] Date of Birth: Feb 20, 1941 Referring Provider: Erling Cruz, MD  Encounter Date: 03/12/2016      OT End of Session - 03/12/16 1204    Visit Number 4   Number of Visits 12   Date for OT Re-Evaluation 04/08/16   Authorization Type [employer] advantage PPO   Authorization Time Period before 10th visit   Authorization - Visit Number 4   Authorization - Number of Visits 10   OT Start Time 1118   OT Stop Time 1151   OT Time Calculation (min) 33 min   Activity Tolerance Patient tolerated treatment well   Behavior During Therapy [facility] for tasks assessed/performed      Past Medical History  Diagnosis Date  . Hypertension   . Hyperlipidemia   . Cancer [facility])     Prostate cancer treated with radiation therapy  . Thyroid disease     hyperparathyroidism  . Anemia   . Positive PPD   . Clot 05/2006    Arterial clot right leg   . Hemodialysis patient ([place]) 6/13  . Diverticulosis   . Anxiety   . Chronic kidney disease     dialysis Mon, Wednes, Fri  . Shortness of breath dyspnea     with exertion, when he has too much fluid  . Constipation   . GERD (gastroesophageal reflux disease)   . Infection     drainage from distal incision of left upper arm arteriovenous graft  . Diabetes mellitus     Type II  . 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 Wears glasses     Past Surgical History  Procedure Laterality Date  . 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 Av fistula placement  11/04/2010    left and left  . Av fistula placement  11/20/2011    Procedure: INSERTION OF ARTERIOVENOUS (AV) GORE-TEX GRAFT ARM;  Surgeon: Angelia Mould, MD;  [facility];  Service: Vascular;  Laterality: Right;  . Colonoscopy N/A 11/10/2012    [id number]  proctitis/Pancolonic diverticulosis/(TUBULAR ADENOMA)5 mm polyp in the transverse segment/4 mm polyps in the base of the cecum and diminutive polyp in the descending segment. INADEQUATE BOWEL PREP  . Colonoscopy N/A 11/28/2013    RMR: Radiation proctitis. Colonic diverticulosis. Colonic polyps-removed as described above.   [name] N/A 10/08/2012    Procedure: [name];  Surgeon: Elam Dutch, MD;  Location: [facility] CATH LAB;  Service: Cardiovascular;  Laterality: N/A;  . Av fistula placement Right 04/19/2015    Procedure: Excision of infected RUA AVG;  Surgeon: Conrad Evansdale, MD;  Location: [place];  Service: Vascular;  Laterality: Right;  . Bascilic vein transposition Left 06/19/2015    Procedure: FIRST STAGE BRACHIAL VEIN TRANSPOSITION;  Surgeon: Conrad Emington, MD;  Location: [place];  Service: Vascular;  Laterality: Left;  . Av fistula placement Left 10/04/2015    Procedure: INSERTION OF ARTERIOVENOUS (AV) GORE-TEX GRAFT ARM;  Surgeon: Conrad Mercer, MD;  Location: [place];  Service: Vascular;  Laterality: Left;  . Avgg removal Left 10/13/2015    Procedure: REMOVAL OF INFECTED ARTERIOVENOUS GORETEX LEFT GRAFT ;  Surgeon: Conrad Puckett, MD;  Location: [place];  Service: Vascular;  Laterality: Left;  . Peripheral vascular catheterization Bilateral 12/06/2015  Procedure: Upper Extremity Venography/ central venogram;  Surgeon: Conrad Garden, MD;  Location: [place] CV LAB;  Service: Cardiovascular;  Laterality: Bilateral;  . Vascular access device insertion Right 01/01/2016    Procedure: INSERTION OF HERO VASCULAR ACCESS DEVICE - RIGHT;  Surgeon: Conrad [place], MD;  Location: [place];  Service: Vascular;  Laterality: Right;  . Removal of a hero device Right 01/19/2016    Procedure: REMOVAL OF A HERO DEVICE;  Surgeon: Elam Dutch, MD;  Location: [place];  Service: Vascular;  Laterality: Right;  . Insertion of dialysis catheter Right 01/19/2016    Procedure: INSERTION OF Right internal jugular  DIALYSIS CATHETER.;   Surgeon: Elam Dutch, MD;  Location: [place];  Service: Vascular;  Laterality: Right;    There were no vitals filed for this visit.      Subjective Assessment - 03/12/16 1122    Subjective  S: I'm sore where they worked on this arm. (port)   Currently in Pain? Yes   Pain Score 5    Pain Location Hand   Pain Orientation Right   Pain Descriptors / Indicators Burning   Pain Type Acute pain   Pain Radiating Towards n/a   Pain Onset 1 to 4 weeks ago   Pain Frequency Intermittent   Aggravating Factors  use   Pain Relieving Factors  nothing   Effect of Pain on Daily Activities limited ability to use right hand during daily tasks   Multiple Pain Sites No            [facility] OT Assessment - 03/12/16 1118    Assessment   Diagnosis Ischemic injury to right hand   Precautions   Precautions None                  OT Treatments/Exercises (OP) - 03/12/16 1123    Exercises   Exercises Hand;Wrist   Wrist Exercises   Forearm Supination PROM;5 reps   Forearm Pronation PROM;5 reps   Wrist Flexion PROM;5 reps   Wrist Extension PROM;5 reps   Wrist Radial Deviation PROM;5 reps   Wrist Ulnar Deviation PROM;5 reps   Additional Wrist Exercises   Sponges Pt used yellow resistive clothespin to grasp high resistance sponges and place into bucket. Pt attempted with red clothespin, able to place one sponge with increased time, then returned to yellow clothespin. Pt utilized lateral pinch for task, however was unable to use side of index finger, instead using pad of index finger and thumb in a lateral grasp. Increased time required for placing 25 sponges.    Hand Exercises   Other Hand Exercises Towel crumple, 10X   Other Hand Exercises Digit composite flexion & extension 10X; towel squeeze 10X   Fine Motor Coordination   Fine Motor Coordination Small Pegboard   Small Pegboard Pt completed small pegboard task, placing pegs with right hand index finger and thumb. Pt with mod-max  difficulty, able to place 17 pegs in 8 minutes. Pt used left hand to find correct color pegs and position in right hand.                   OT Short Term Goals - 02/27/16 1458    OT SHORT TERM GOAL #1   Title Patient will be educated and independent with HEP to increase functional use of RUE during daily tasks.    Time 3   Period Weeks   Status On-going   OT SHORT TERM GOAL #2  Title Patient will increase A/ROM of right hand to 100% to increase ability to grasp household items fully as needed.    Time 3   Period Weeks   Status On-going   OT SHORT TERM GOAL #3   Title Patient will increase grip strength by 5# and pinch strength by 3# to increase ability to do buttons when getting dressed.    Time 3   Period Weeks   Status On-going   OT SHORT TERM GOAL #4   Title Patient will utilize desensitization techniques to decrease hypersensitivity in right hand in order to allow him to participate in more daily activities using hand for 50% or more of the tasks.    Time 3   Period Weeks   Status On-going           OT Long Term Goals - 02/27/16 1458    OT LONG TERM GOAL #1   Title Patient will return to highest level of independence using RUE for 75% or more of daily tasks.    Time 6   Period Weeks   Status On-going   OT LONG TERM GOAL #2   Title Patient will increase grip strength by 10# and pinch strength by 5# to increase ability to open food packets.    Time 6   Period Weeks   Status On-going   OT LONG TERM GOAL #3   Title Patient will increase coordination by completing the 9 hole peg test in 48 seconds or less with Right hand.    Time 6   Period Weeks   Status On-going               Plan - 03/12/16 1204    Clinical Impression Statement A: Continued with pinch strengthening and fine motor coordination activity this session. Pt very tired during session due to ER visit last night and dialysis this morning. Pt reports continued burning along ulnar side of  hand. Pt continues to have difficulty with fine motor coordination, however does demonstrate improvements since previous trial with pegboard. OT ended session early due to pt feeling unwell.    Rehab Potential Excellent   OT Frequency 2x / week   OT Duration 6 weeks   OT Treatment/Interventions Self-care/ADL training;Ultrasound;DME and/or AE instruction;Passive range of motion;Patient/family education;Cryotherapy;Parrafin;Electrical Stimulation;Moist Heat;Neuromuscular education;Therapeutic activities;Therapeutic exercises;Manual Therapy;Splinting   Plan P: Resume grip strengthening activities if pt able to tolerate. Attempt to increase gripper to 18#   Consulted and Agree with Plan of Care Patient      Patient will benefit from skilled therapeutic intervention in order to improve the following deficits and impairments:  Decreased strength, Impaired sensation, Impaired UE functional use, Decreased range of motion, Decreased coordination  Visit Diagnosis: Other symptoms and signs involving the musculoskeletal system  Other lack of coordination    Problem List Patient Active Problem List   Diagnosis Date Noted  . 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 Infection 10/13/2015  . Infection of arteriovenous graft for hemodialysis ([place]) 10/12/2015  . ESRD on dialysis ([place]) 06/15/2015  . Diabetic neuropathy associated with type 2 diabetes mellitus ([place]) 05/25/2015  . Infection, dialysis vascular access ([place]) 04/19/2015  . Rectal bleeding 02/06/2015  . SOB (shortness of breath) 03/24/2014  . Abnormal EKG 03/24/2014  . Essential hypertension, benign 03/15/2014  . Mixed hyperlipidemia 03/15/2014  . H/O adenomatous polyp of colon 11/07/2013  . Constipation 06/15/2013  . Encounter for screening colonoscopy 11/02/2012  . Fecal incontinence 09/10/2012  . GERD (  gastroesophageal reflux disease) 09/10/2012  . Diabetes mellitus 08/06/2011  . CRF (chronic renal failure)  08/06/2011    Guadelupe Sabin, OTR/L  [phone] 03/12/2016, 12:09 PM  [place] [address] [place], [name], [address] Phone: [phone]   Fax:  [phone]  Name: Gerald Davenport MRN: [id number] Date of Birth: July 10, 1941

## 2016-03-13 ENCOUNTER — Ambulatory Visit (HOSPITAL_COMMUNITY): Payer: Medicare Other

## 2016-03-13 ENCOUNTER — Telehealth (HOSPITAL_COMMUNITY): Payer: Self-pay

## 2016-03-13 NOTE — Telephone Encounter (Signed)
Called patient regarding no show. Patient states that daughter was trying to call clinic to cancel although she had the wrong number. Patient is not feeling well. Patient was given correct phone number for clinic and reminded of next appointment. Patient's missed appointment was canceled versus being a no show.   Ailene Ravel, OTR/L,CBIS  [phone]

## 2016-03-18 ENCOUNTER — Ambulatory Visit (HOSPITAL_COMMUNITY): Payer: Medicare Other | Admitting: Occupational Therapy

## 2016-03-18 ENCOUNTER — Encounter (HOSPITAL_COMMUNITY): Payer: Self-pay | Admitting: Occupational Therapy

## 2016-03-18 DIAGNOSIS — R29898 Other symptoms and signs involving the musculoskeletal system: Secondary | ICD-10-CM | POA: Diagnosis not present

## 2016-03-18 DIAGNOSIS — R278 Other lack of coordination: Secondary | ICD-10-CM

## 2016-03-18 NOTE — Therapy (Signed)
[place], [name], [address] Phone: [phone]   Fax:  [phone]  Occupational Therapy Treatment  Patient Details  Name: Gerald Davenport MRN: [id number] Date of Birth: 03-Nov-1940 Referring Provider: Erling Cruz, MD  Encounter Date: 03/18/2016      OT End of Session - 03/18/16 1130    Visit Number 5   Number of Visits 12   Date for OT Re-Evaluation 04/08/16   Authorization Type [employer] advantage PPO   Authorization Time Period before 10th visit   Authorization - Visit Number 5   Authorization - Number of Visits 10   OT Start Time [phone]   OT Stop Time 1028   OT Time Calculation (min) 41 min   Activity Tolerance Patient tolerated treatment well   Behavior During Therapy [facility] for tasks assessed/performed      Past Medical History  Diagnosis Date  . Hypertension   . Hyperlipidemia   . Cancer [facility])     Prostate cancer treated with radiation therapy  . Thyroid disease     hyperparathyroidism  . Anemia   . Positive PPD   . Clot 05/2006    Arterial clot right leg   . Hemodialysis patient ([place]) 6/13  . Diverticulosis   . Anxiety   . Chronic kidney disease     dialysis Mon, Wednes, Fri  . Shortness of breath dyspnea     with exertion, when he has too much fluid  . Constipation   . GERD (gastroesophageal reflux disease)   . Infection     drainage from distal incision of left upper arm arteriovenous graft  . Diabetes mellitus     Type II  . Steal syndrome dialysis vascular access ([place])   . Wears glasses     Past Surgical History  Procedure Laterality Date  . 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 Av fistula placement  11/04/2010    left and left  . Av fistula placement  11/20/2011    Procedure: INSERTION OF ARTERIOVENOUS (AV) GORE-TEX GRAFT ARM;  Surgeon: Angelia Mould, MD;  [facility];  Service: Vascular;  Laterality: Right;  . Colonoscopy N/A 11/10/2012    [id number]  proctitis/Pancolonic diverticulosis/(TUBULAR ADENOMA)5 mm polyp in the transverse segment/4 mm polyps in the base of the cecum and diminutive polyp in the descending segment. INADEQUATE BOWEL PREP  . Colonoscopy N/A 11/28/2013    RMR: Radiation proctitis. Colonic diverticulosis. Colonic polyps-removed as described above.   [name] N/A 10/08/2012    Procedure: [name];  Surgeon: Elam Dutch, MD;  Location: [facility] CATH LAB;  Service: Cardiovascular;  Laterality: N/A;  . Av fistula placement Right 04/19/2015    Procedure: Excision of infected RUA AVG;  Surgeon: Conrad McDonald, MD;  Location: [place];  Service: Vascular;  Laterality: Right;  . Bascilic vein transposition Left 06/19/2015    Procedure: FIRST STAGE BRACHIAL VEIN TRANSPOSITION;  Surgeon: Conrad [place], MD;  Location: [place];  Service: Vascular;  Laterality: Left;  . Av fistula placement Left 10/04/2015    Procedure: INSERTION OF ARTERIOVENOUS (AV) GORE-TEX GRAFT ARM;  Surgeon: Conrad [place], MD;  Location: [place];  Service: Vascular;  Laterality: Left;  . Avgg removal Left 10/13/2015    Procedure: REMOVAL OF INFECTED ARTERIOVENOUS GORETEX LEFT GRAFT ;  Surgeon: Conrad [place], MD;  Location: [place];  Service: Vascular;  Laterality: Left;  . Peripheral vascular catheterization Bilateral 12/06/2015  Procedure: Upper Extremity Venography/ central venogram;  Surgeon: Conrad [place], MD;  Location: [place] CV LAB;  Service: Cardiovascular;  Laterality: Bilateral;  . Vascular access device insertion Right 01/01/2016    Procedure: INSERTION OF HERO VASCULAR ACCESS DEVICE - RIGHT;  Surgeon: Conrad [place], MD;  Location: [place];  Service: Vascular;  Laterality: Right;  . Removal of a hero device Right 01/19/2016    Procedure: REMOVAL OF A HERO DEVICE;  Surgeon: Elam Dutch, MD;  Location: [place];  Service: Vascular;  Laterality: Right;  . Insertion of dialysis catheter Right 01/19/2016    Procedure: INSERTION OF Right internal jugular  DIALYSIS CATHETER.;   Surgeon: Elam Dutch, MD;  Location: [place];  Service: Vascular;  Laterality: Right;    There were no vitals filed for this visit.      Subjective Assessment - 03/18/16 0948    Subjective  S: I haven't done my exercises much because I've been sick.    Currently in Pain? Yes   Pain Score 5    Pain Location Finger (Comment which one)  all fingertips   Pain Orientation Right   Pain Descriptors / Indicators Burning   Pain Type Acute pain   Pain Radiating Towards n/a   Pain Onset 1 to 4 weeks ago   Pain Frequency Intermittent   Aggravating Factors  use   Pain Relieving Factors nothing   Effect of Pain on Daily Activities limited ability to use right hand during daily tasks   Multiple Pain Sites No            [facility] OT Assessment - 03/18/16 0948    Assessment   Diagnosis Ischemic injury to right hand   Precautions   Precautions None                  OT Treatments/Exercises (OP) - 03/18/16 0953    Exercises   Exercises Hand;Wrist   Additional Wrist Exercises   Hand Gripper with Large Beads 6/6 gripper set to 15#   Hand Gripper with Small Beads 17/17 gripper set to 15#  mod-max difficulty   Hand Exercises   Other Hand Exercises Pt completed resistive clothespin activity lining pins along vertical pole. Pt alternated between lateral and 3 point pinch to place 7/7 yellow, 7/7 red, and 7/7 green. Pt with min difficulty with yellow and red clothespins, mod difficulty with green clothespins.                   OT Short Term Goals - 02/27/16 1458    OT SHORT TERM GOAL #1   Title Patient will be educated and independent with HEP to increase functional use of RUE during daily tasks.    Time 3   Period Weeks   Status On-going   OT SHORT TERM GOAL #2   Title Patient will increase A/ROM of right hand to 100% to increase ability to grasp household items fully as needed.    Time 3   Period Weeks   Status On-going   OT SHORT TERM GOAL #3   Title Patient  will increase grip strength by 5# and pinch strength by 3# to increase ability to do buttons when getting dressed.    Time 3   Period Weeks   Status On-going   OT SHORT TERM GOAL #4   Title Patient will utilize desensitization techniques to decrease hypersensitivity in right hand in order to allow him to participate in more daily activities using  hand for 50% or more of the tasks.    Time 3   Period Weeks   Status On-going           OT Long Term Goals - 02/27/16 1458    OT LONG TERM GOAL #1   Title Patient will return to highest level of independence using RUE for 75% or more of daily tasks.    Time 6   Period Weeks   Status On-going   OT LONG TERM GOAL #2   Title Patient will increase grip strength by 10# and pinch strength by 5# to increase ability to open food packets.    Time 6   Period Weeks   Status On-going   OT LONG TERM GOAL #3   Title Patient will increase coordination by completing the 9 hole peg test in 48 seconds or less with Right hand.    Time 6   Period Weeks   Status On-going               Plan - 03/18/16 1131    Clinical Impression Statement A: Resumed grip strengthening this session, pt unable to complete exercise with gripper set at 18#, completed at 15# with increased difficulty. Pt reports he has not complete his HEP over the weekend due to being sick. Improvement noted in pinch strength with ability to pinch green clothespins. Verbal cuing for technique when completing pinch strengthening, increased time and rest breaks required for all exercises.    Rehab Potential Excellent   OT Frequency 2x / week   OT Duration 6 weeks   OT Treatment/Interventions Self-care/ADL training;Ultrasound;DME and/or AE instruction;Passive range of motion;Patient/family education;Cryotherapy;Parrafin;Electrical Stimulation;Moist Heat;Neuromuscular education;Therapeutic activities;Therapeutic exercises;Manual Therapy;Splinting   Plan P: Continue with grip and pinch  strengthening, fine motor coordination activities. Increase gripper resistance as pt is able.       Patient will benefit from skilled therapeutic intervention in order to improve the following deficits and impairments:  Decreased strength, Impaired sensation, Impaired UE functional use, Decreased range of motion, Decreased coordination  Visit Diagnosis: Other symptoms and signs involving the musculoskeletal system  Other lack of coordination    Problem List Patient Active Problem List   Diagnosis Date Noted  . 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 Infection 10/13/2015  . Infection of arteriovenous graft for hemodialysis ([place]) 10/12/2015  . ESRD on dialysis ([place]) 06/15/2015  . Diabetic neuropathy associated with type 2 diabetes mellitus ([place]) 05/25/2015  . Infection, dialysis vascular access ([place]) 04/19/2015  . Rectal bleeding 02/06/2015  . SOB (shortness of breath) 03/24/2014  . Abnormal EKG 03/24/2014  . Essential hypertension, benign 03/15/2014  . Mixed hyperlipidemia 03/15/2014  . H/O adenomatous polyp of colon 11/07/2013  . Constipation 06/15/2013  . Encounter for screening colonoscopy 11/02/2012  . Fecal incontinence 09/10/2012  . GERD (gastroesophageal reflux disease) 09/10/2012  . Diabetes mellitus 08/06/2011  . CRF (chronic renal failure) 08/06/2011    Guadelupe Sabin, OTR/L  [phone]  03/18/2016, 11:34 AM  [facility] [address] [place], [name], [address] Phone: [phone]   Fax:  [phone]  Name: DAWSYN KALKMAN MRN: [id number] Date of Birth: [birth year]

## 2016-03-20 ENCOUNTER — Encounter (HOSPITAL_COMMUNITY): Payer: Self-pay

## 2016-03-20 ENCOUNTER — Ambulatory Visit (HOSPITAL_COMMUNITY): Payer: Medicare Other

## 2016-03-20 DIAGNOSIS — R29898 Other symptoms and signs involving the musculoskeletal system: Secondary | ICD-10-CM

## 2016-03-20 DIAGNOSIS — R278 Other lack of coordination: Secondary | ICD-10-CM

## 2016-03-20 NOTE — Patient Instructions (Signed)
Home Exercises Program Theraputty Exercises  Do the following exercises 2-3 times a day using your affected hand. Spend 15-30 minutes on these exercises. 1. Roll putty into a ball.  2. Make into a pancake.  3. Roll putty into a roll.  4. Pinch along log with first finger and thumb.   5. Make into a ball.  6. Roll it back into a log.   7. Pinch using thumb and side of first finger.  8. Roll into a ball, then flatten into a pancake.  9. Using your fingers, make putty into a mountain.

## 2016-03-20 NOTE — Therapy (Signed)
[place], [name], [address] Phone: [phone]   Fax:  [phone]  Occupational Therapy Treatment  Patient Details  Name: Gerald Davenport MRN: [id number] Date of Birth: 1941-04-26 Referring Provider: Erling Cruz, MD  Encounter Date: 03/20/2016      OT End of Session - 03/20/16 1010    Visit Number 6   Number of Visits 12   Date for OT Re-Evaluation 04/08/16   Authorization Type [employer] Med[employer] advantage PPO   Authorization Time Period before 10th visit   Authorization - Visit Number 6   Authorization - Number of Visits 10   OT Start Time 0915  Pt arrived late   OT Stop Time 0945   OT Time Calculation (min) 30 min   Activity Tolerance Patient tolerated treatment well   Behavior During Therapy [facility] for tasks assessed/performed      Past Medical History  Diagnosis Date  . Hypertension   . Hyperlipidemia   . Cancer [facility]     Prostate cancer treated with radiation therapy  . Thyroid disease     hyperparathyroidism  . Anemia   . Positive PPD   . Clot 05/2006    Arterial clot right leg   . Hemodialysis patient ([place]) 6/13  . Diverticulosis   . Anxiety   . Chronic kidney disease     dialysis Mon, Wednes, Fri  . Shortness of breath dyspnea     with exertion, when he has too much fluid  . Constipation   . GERD (gastroesophageal reflux disease)   . Infection     drainage from distal incision of left upper arm arteriovenous graft  . Diabetes mellitus     Type II  . Steal syndrome dialysis vascular access ([place])   . Wears glasses     Past Surgical History  Procedure Laterality Date  . 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 Av fistula placement  11/04/2010    left and left  . Av fistula placement  11/20/2011    Procedure: INSERTION OF ARTERIOVENOUS (AV) GORE-TEX GRAFT ARM;  Surgeon: Angelia Mould, MD;  [facility];  Service: Vascular;  Laterality: Right;  . Colonoscopy N/A 11/10/2012   [id number] proctitis/Pancolonic diverticulosis/(TUBULAR ADENOMA)5 mm polyp in the transverse segment/4 mm polyps in the base of the cecum and diminutive polyp in the descending segment. INADEQUATE BOWEL PREP  . Colonoscopy N/A 11/28/2013    RMR: Radiation proctitis. Colonic diverticulosis. Colonic polyps-removed as described above.   [name] N/A 10/08/2012    Procedure: [name];  Surgeon: Elam Dutch, MD;  Location: [facility] CATH LAB;  Service: Cardiovascular;  Laterality: N/A;  . Av fistula placement Right 04/19/2015    Procedure: Excision of infected RUA AVG;  Surgeon: Conrad Stinnett, MD;  Location: [place];  Service: Vascular;  Laterality: Right;  . Bascilic vein transposition Left 06/19/2015    Procedure: FIRST STAGE BRACHIAL VEIN TRANSPOSITION;  Surgeon: Conrad Gurdon, MD;  Location: [place];  Service: Vascular;  Laterality: Left;  . Av fistula placement Left 10/04/2015    Procedure: INSERTION OF ARTERIOVENOUS (AV) GORE-TEX GRAFT ARM;  Surgeon: Conrad [place]st, MD;  Location: [place];  Service: Vascular;  Laterality: Left;  . Avgg removal Left 10/13/2015    Procedure: REMOVAL OF INFECTED ARTERIOVENOUS GORETEX LEFT GRAFT ;  Surgeon: Conrad Thayer, MD;  Location: [place];  Service: Vascular;  Laterality: Left;  . Peripheral vascular catheterization Bilateral 12/06/2015  Procedure: Upper Extremity Venography/ central venogram;  Surgeon: Conrad Goodman, MD;  Location: [place] CV LAB;  Service: Cardiovascular;  Laterality: Bilateral;  . Vascular access device insertion Right 01/01/2016    Procedure: INSERTION OF HERO VASCULAR ACCESS DEVICE - RIGHT;  Surgeon: Conrad Colbert, MD;  Location: [place];  Service: Vascular;  Laterality: Right;  . Removal of a hero device Right 01/19/2016    Procedure: REMOVAL OF A HERO DEVICE;  Surgeon: Elam Dutch, MD;  Location: [place];  Service: Vascular;  Laterality: Right;  . Insertion of dialysis catheter Right 01/19/2016    Procedure: INSERTION OF Right internal jugular   DIALYSIS CATHETER.;  Surgeon: Elam Dutch, MD;  Location: [place];  Service: Vascular;  Laterality: Right;    There were no vitals filed for this visit.      Subjective Assessment - 03/20/16 0938    Subjective  S: My eyed    Currently in Pain? Other (Comment)  Pt reports discomfort in dialysis port stating that it feels too tight.            [facility] OT Assessment - 03/20/16 0924    Assessment   Diagnosis Ischemic injury to right hand   Precautions   Precautions None                  OT Treatments/Exercises (OP) - 03/20/16 [id number]    ADLs   UB Dressing Pt arrived with button down shirt unbuttoned stating that he needed  help. Pt buttoned 4 buttons on his shirt using bilateral hands and taking 10 minutes to complete. Max difficulty due to decreased fine motor coordination.   Exercises   Exercises Hand;Wrist   Additional Wrist Exercises   Theraputty Flatten;Roll;Grip;Pinch   Theraputty - Flatten yellow   Theraputty - Roll yellow   Theraputty - Grip yellow   Theraputty - Pinch yellow -3 point                OT Education - 03/20/16 1010    Education provided Yes   Education Details yellow theraputty with handout of exercises   Person(s) Educated Patient   Methods Explanation;Demonstration;Verbal cues;Handout   Comprehension Returned demonstration;Verbalized understanding          OT Short Term Goals - 02/27/16 1458    OT SHORT TERM GOAL #1   Title Patient will be educated and independent with HEP to increase functional use of RUE during daily tasks.    Time 3   Period Weeks   Status On-going   OT SHORT TERM GOAL #2   Title Patient will increase A/ROM of right hand to 100% to increase ability to grasp household items fully as needed.    Time 3   Period Weeks   Status On-going   OT SHORT TERM GOAL #3   Title Patient will increase grip strength by 5# and pinch strength by 3# to increase ability to do buttons when getting dressed.    Time 3    Period Weeks   Status On-going   OT SHORT TERM GOAL #4   Title Patient will utilize desensitization techniques to decrease hypersensitivity in right hand in order to allow him to participate in more daily activities using hand for 50% or more of the tasks.    Time 3   Period Weeks   Status On-going           OT Long Term Goals - 02/27/16 1458    OT LONG TERM  GOAL #1   Title Patient will return to highest level of independence using RUE for 75% or more of daily tasks.    Time 6   Period Weeks   Status On-going   OT LONG TERM GOAL #2   Title Patient will increase grip strength by 10# and pinch strength by 5# to increase ability to open food packets.    Time 6   Period Weeks   Status On-going   OT LONG TERM GOAL #3   Title Patient will increase coordination by completing the 9 hole peg test in 48 seconds or less with Right hand.    Time 6   Period Weeks   Status On-going               Plan - 03/20/16 1011    Clinical Impression Statement A: Pt arrived with his shirt unbuttoned and initially asked for help with buttoning. Therapist had him button shirt indpendently with increased time. Pt required VC for technique during theraputty exercises although had increased difficulty following precise directions. Pt reports that it feels like he has a fog over his eyes and they become fatigued frequently. Pt preferred to keep his eyes closed during session. Pt reports that he sees his eye doctor in July.    Plan P: Follow up on theraputty exercises. Complete coordination activity. Box and blocks.      Patient will benefit from skilled therapeutic intervention in order to improve the following deficits and impairments:  Decreased strength, Impaired sensation, Impaired UE functional use, Decreased range of motion, Decreased coordination  Visit Diagnosis: Other symptoms and signs involving the musculoskeletal system  Other lack of coordination    Problem List Patient Active  Problem List   Diagnosis Date Noted  . Steal syndrome of dialysis vascular access ([place]) 01/18/2016  . Infection 10/13/2015  . Infection of arteriovenous graft for hemodialysis ([place]) 10/12/2015  . ESRD on dialysis ([place]) 06/15/2015  . Diabetic neuropathy associated with type 2 diabetes mellitus ([place]) 05/25/2015  . Infection, dialysis vascular access ([place]) 04/19/2015  . Rectal bleeding 02/06/2015  . SOB (shortness of breath) 03/24/2014  . Abnormal EKG 03/24/2014  . Essential hypertension, benign 03/15/2014  . Mixed hyperlipidemia 03/15/2014  . H/O adenomatous polyp of colon 11/07/2013  . Constipation 06/15/2013  . Encounter for screening colonoscopy 11/02/2012  . Fecal incontinence 09/10/2012  . GERD (gastroesophageal reflux disease) 09/10/2012  . Diabetes mellitus 08/06/2011  . CRF (chronic renal failure) 08/06/2011    Ailene Ravel, OTR/L,CBIS  [phone]  03/20/2016, 10:14 AM  [facility] [address] [place], [name], [address] Phone: [phone]   Fax:  [phone]  Name: Gerald Davenport MRN: [id number] Date of Birth: [birth year]

## 2016-03-24 ENCOUNTER — Ambulatory Visit (HOSPITAL_COMMUNITY): Payer: Medicare Other

## 2016-03-24 ENCOUNTER — Encounter (HOSPITAL_COMMUNITY): Payer: Self-pay

## 2016-03-24 DIAGNOSIS — R29898 Other symptoms and signs involving the musculoskeletal system: Secondary | ICD-10-CM

## 2016-03-24 DIAGNOSIS — R278 Other lack of coordination: Secondary | ICD-10-CM

## 2016-03-24 NOTE — Therapy (Signed)
[place], [name], [address] Phone: [phone]   Fax:  [phone]  Occupational Therapy Treatment  Patient Details  Name: Gerald Davenport MRN: [id number] Date of Birth: [birth year] Referring Provider: Erling Cruz, MD  Encounter Date: 03/24/2016      OT End of Session - 03/24/16 1548    Visit Number 7   Number of Visits 12   Date for OT Re-Evaluation 04/08/16   Authorization Type [employer] Med[employer] advantage PPO   Authorization Time Period before 10th visit   Authorization - Visit Number 7   Authorization - Number of Visits 10   OT Start Time 1440  Pt arrived late   OT Stop Time 1517   OT Time Calculation (min) 37 min   Activity Tolerance Patient tolerated treatment well   Behavior During Therapy [facility] for tasks assessed/performed      Past Medical History  Diagnosis Date  . Hypertension   . Hyperlipidemia   . Cancer [facility])     Prostate cancer treated with radiation therapy  . Thyroid disease     hyperparathyroidism  . Anemia   . Positive PPD   . Clot 05/2006    Arterial clot right leg   . Hemodialysis patient ([place]) 6/13  . Diverticulosis   . Anxiety   . Chronic kidney disease     dialysis Mon, Wednes, Fri  . Shortness of breath dyspnea     with exertion, when he has too much fluid  . Constipation   . GERD (gastroesophageal reflux disease)   . Infection     drainage from distal incision of left upper arm arteriovenous graft  . Diabetes mellitus     Type II  . Steal syndrome dialysis vascular access ([place])   . Wears glasses     Past Surgical History  Procedure Laterality Date  . 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 Av fistula placement  11/04/2010    left and left  . Av fistula placement  11/20/2011    Procedure: INSERTION OF ARTERIOVENOUS (AV) GORE-TEX GRAFT ARM;  Surgeon: Angelia Mould, MD;  [facility];  Service: Vascular;  Laterality: Right;  . Colonoscopy N/A 11/10/2012   [id number] proctitis/Pancolonic diverticulosis/(TUBULAR ADENOMA)5 mm polyp in the transverse segment/4 mm polyps in the base of the cecum and diminutive polyp in the descending segment. INADEQUATE BOWEL PREP  . Colonoscopy N/A 11/28/2013    RMR: Radiation proctitis. Colonic diverticulosis. Colonic polyps-removed as described above.   [name] N/A 10/08/2012    Procedure: [name];  Surgeon: Elam Dutch, MD;  Location: [place] CATH LAB;  Service: Cardiovascular;  Laterality: N/A;  . Av fistula placement Right 04/19/2015    Procedure: Excision of infected RUA AVG;  Surgeon: Conrad Edgar, MD;  Location: [place];  Service: Vascular;  Laterality: Right;  . Bascilic vein transposition Left 06/19/2015    Procedure: FIRST STAGE BRACHIAL VEIN TRANSPOSITION;  Surgeon: Conrad [place], MD;  Location: [place];  Service: Vascular;  Laterality: Left;  . Av fistula placement Left 10/04/2015    Procedure: INSERTION OF ARTERIOVENOUS (AV) GORE-TEX GRAFT ARM;  Surgeon: Conrad Horse [place]oe, MD;  Location: [place];  Service: Vascular;  Laterality: Left;  . Avgg removal Left 10/13/2015    Procedure: REMOVAL OF INFECTED ARTERIOVENOUS GORETEX LEFT GRAFT ;  Surgeon: Conrad Biggers, MD;  Location: [place];  Service: Vascular;  Laterality: Left;  . Peripheral vascular catheterization Bilateral 12/06/2015  Procedure: Upper Extremity Venography/ central venogram;  Surgeon: Conrad [place], MD;  Location: [place] CV LAB;  Service: Cardiovascular;  Laterality: Bilateral;  . Vascular access device insertion Right 01/01/2016    Procedure: INSERTION OF HERO VASCULAR ACCESS DEVICE - RIGHT;  Surgeon: Conrad McMechen, MD;  Location: [place];  Service: Vascular;  Laterality: Right;  . Removal of a hero device Right 01/19/2016    Procedure: REMOVAL OF A HERO DEVICE;  Surgeon: Elam Dutch, MD;  Location: [place];  Service: Vascular;  Laterality: Right;  . Insertion of dialysis catheter Right 01/19/2016    Procedure: INSERTION OF Right internal jugular   DIALYSIS CATHETER.;  Surgeon: Elam Dutch, MD;  Location: [place];  Service: Vascular;  Laterality: Right;    There were no vitals filed for this visit.      Subjective Assessment - 03/24/16 1456    Subjective  S: The port is still bothering me. I don't know if they'll do anything about it.    Currently in Pain? Other (Comment)  Pt continues to report discomfort with dialysis port.            [facility] OT Assessment - 03/24/16 1456    Assessment   Diagnosis Ischemic injury to right hand   Precautions   Precautions None                  OT Treatments/Exercises (OP) - 03/24/16 1453    Exercises   Exercises Hand;Wrist   Additional Wrist Exercises   Theraputty Flatten;Locate Pegs   Theraputty - Flatten Yellow   Theraputty - Locate Pegs yellow - 9/10 beads located   Hand Exercises   MCPJ Flexion AROM;10 reps   MCPJ Extension AROM;10 reps   PIPJ Flexion AROM;10 reps   PIPJ Extension AROM;10 reps   DIPJ Flexion AROM;10 reps   DIPJ Extension AROM;10 reps   Digit Composite ABduction AROM;10 reps   Digit Composite ADduction AROM;10 reps   Opposition AROM  1X each finger. Unable to complete with 5th digit   Digit Abduction/Adduction Pt completed adduction qwith digit 2&3 then 3&4; 5X   Other Hand Exercises Pt utilized pvc pipe to cut circle shapes into yellow putting using Right hand.    Other Hand Exercises Box and Blocks. Right: 29 Left: 31                  OT Short Term Goals - 02/27/16 1458    OT SHORT TERM GOAL #1   Title Patient will be educated and independent with HEP to increase functional use of RUE during daily tasks.    Time 3   Period Weeks   Status On-going   OT SHORT TERM GOAL #2   Title Patient will increase A/ROM of right hand to 100% to increase ability to grasp household items fully as needed.    Time 3   Period Weeks   Status On-going   OT SHORT TERM GOAL #3   Title Patient will increase grip strength by 5# and pinch strength  by 3# to increase ability to do buttons when getting dressed.    Time 3   Period Weeks   Status On-going   OT SHORT TERM GOAL #4   Title Patient will utilize desensitization techniques to decrease hypersensitivity in right hand in order to allow him to participate in more daily activities using hand for 50% or more of the tasks.    Time 3   Period Weeks  Status On-going           OT Long Term Goals - 02/27/16 1458    OT LONG TERM GOAL #1   Title Patient will return to highest level of independence using RUE for 75% or more of daily tasks.    Time 6   Period Weeks   Status On-going   OT LONG TERM GOAL #2   Title Patient will increase grip strength by 10# and pinch strength by 5# to increase ability to open food packets.    Time 6   Period Weeks   Status On-going   OT LONG TERM GOAL #3   Title Patient will increase coordination by completing the 9 hole peg test in 48 seconds or less with Right hand.    Time 6   Period Weeks   Status On-going               Plan - 03/24/16 1556    Clinical Impression Statement A: Focused on coordination and pinch strength this session. Pt Did very well with the box and block test. Reports that his right hand is feeling stiff today. VC to complete exercises with proper form and technique.    Plan P: Completed colored or grooved pegboard      Patient will benefit from skilled therapeutic intervention in order to improve the following deficits and impairments:  Decreased strength, Impaired sensation, Impaired UE functional use, Decreased range of motion, Decreased coordination  Visit Diagnosis: Other symptoms and signs involving the musculoskeletal system  Other lack of coordination    Problem List Patient Active Problem List   Diagnosis Date Noted  . Steal syndrome of dialysis vascular access ([place]) 01/18/2016  . Infection 10/13/2015  . Infection of arteriovenous graft for hemodialysis ([place]) 10/12/2015  . ESRD on dialysis ([place])  06/15/2015  . Diabetic neuropathy associated with type 2 diabetes mellitus ([place]) 05/25/2015  . Infection, dialysis vascular access ([place]) 04/19/2015  . Rectal bleeding 02/06/2015  . SOB (shortness of breath) 03/24/2014  . Abnormal EKG 03/24/2014  . Essential hypertension, benign 03/15/2014  . Mixed hyperlipidemia 03/15/2014  . H/O adenomatous polyp of colon 11/07/2013  . Constipation 06/15/2013  . Encounter for screening colonoscopy 11/02/2012  . Fecal incontinence 09/10/2012  . GERD (gastroesophageal reflux disease) 09/10/2012  . Diabetes mellitus 08/06/2011  . CRF (chronic renal failure) 08/06/2011    Ailene Ravel, OTR/L,CBIS  [phone]  03/24/2016, 4:02 PM  [place] [address] [place], [name], [address] Phone: [phone]   Fax:  [phone]  Name: Gerald Davenport MRN: [id number] Date of Birth: Oct 05, 1940

## 2016-03-25 ENCOUNTER — Encounter: Payer: Self-pay | Admitting: Podiatry

## 2016-03-25 ENCOUNTER — Ambulatory Visit (INDEPENDENT_AMBULATORY_CARE_PROVIDER_SITE_OTHER): Payer: Medicare Other | Admitting: Podiatry

## 2016-03-25 DIAGNOSIS — E1142 Type 2 diabetes mellitus with diabetic polyneuropathy: Secondary | ICD-10-CM

## 2016-03-25 DIAGNOSIS — L84 Corns and callosities: Secondary | ICD-10-CM

## 2016-03-25 DIAGNOSIS — B351 Tinea unguium: Secondary | ICD-10-CM

## 2016-03-25 DIAGNOSIS — M79676 Pain in unspecified toe(s): Secondary | ICD-10-CM | POA: Diagnosis not present

## 2016-03-25 NOTE — Patient Instructions (Signed)
Diabetes and Foot Care Diabetes may cause you to have problems because of poor blood supply (circulation) to your feet and legs. This may cause the skin on your feet to become thinner, break easier, and heal more slowly. Your skin may become dry, and the skin may peel and crack. You may also have nerve damage in your legs and feet causing decreased feeling in them. You may not notice minor injuries to your feet that could lead to infections or more serious problems. Taking care of your feet is one of the most important things you can do for yourself.  HOME CARE INSTRUCTIONS  Wear shoes at all times, even in the house. Do not go barefoot. Bare feet are easily injured.  Check your feet daily for blisters, cuts, and redness. If you cannot see the bottom of your feet, use a mirror or ask someone for help.  Wash your feet with warm water (do not use hot water) and mild soap. Then pat your feet and the areas between your toes until they are completely dry. Do not soak your feet as this can dry your skin.  Apply a moisturizing lotion or petroleum jelly (that does not contain alcohol and is unscented) to the skin on your feet and to dry, brittle toenails. Do not apply lotion between your toes.  Trim your toenails straight across. Do not dig under them or around the cuticle. File the edges of your nails with an emery board or nail file.  Do not cut corns or calluses or try to remove them with medicine.  Wear clean socks or stockings every day. Make sure they are not too tight. Do not wear knee-high stockings since they may decrease blood flow to your legs.  Wear shoes that fit properly and have enough cushioning. To break in new shoes, wear them for just a few hours a day. This prevents you from injuring your feet. Always look in your shoes before you put them on to be sure there are no objects inside.  Do not cross your legs. This may decrease the blood flow to your feet.  If you find a minor scrape,  cut, or break in the skin on your feet, keep it and the skin around it clean and dry. These areas may be cleansed with mild soap and water. Do not cleanse the area with peroxide, alcohol, or iodine.  When you remove an adhesive bandage, be sure not to damage the skin around it.  If you have a wound, look at it several times a day to make sure it is healing.  Do not use heating pads or hot water bottles. They may burn your skin. If you have lost feeling in your feet or legs, you may not know it is happening until it is too late.  Make sure your health care provider performs a complete foot exam at least annually or more often if you have foot problems. Report any cuts, sores, or bruises to your health care provider immediately. SEEK MEDICAL CARE IF:   You have an injury that is not healing.  You have cuts or breaks in the skin.  You have an ingrown nail.  You notice redness on your legs or feet.  You feel burning or tingling in your legs or feet.  You have pain or cramps in your legs and feet.  Your legs or feet are numb.  Your feet always feel cold. SEEK IMMEDIATE MEDICAL CARE IF:   There is increasing redness,   swelling, or pain in or around a wound.  There is a red line that goes up your leg.  Pus is coming from a wound.  You develop a fever or as directed by your health care provider.  You notice a bad smell coming from an ulcer or wound.   This information is not intended to replace advice given to you by your health care provider. Make sure you discuss any questions you have with your health care provider.   Document Released: 09/12/2000 Document Revised: 05/18/2013 Document Reviewed: 02/22/2013 Elsevier Interactive Patient Education 2016 Elsevier Inc.  

## 2016-03-25 NOTE — Progress Notes (Signed)
Patient ID: Gerald Davenport, male   DOB: July 01, 1941, 75 y.o.   MRN: [id number]   Subjective: This patient presents today complaining of uncomfortable toenails on the left foot and uncomfortable callus on the left foot requesting skin a nail debridement   Objective: Orientated 3 Review of arterial Doppler dated 11/06/2015 Stable moderate left ABI Abnormal left great toe brachial index  No open skin lesions left Plantar callus left BK amputation right Hypertrophic toenails with deformity 1-5 left Good fit of new balance shoes with custom molded inserts 3 left Hammertoe second left  Assessment: Diabetic with peripheral neuropathy Diabetic with peripheral arterial disease No open skin lesions left Satisfactory fit of diabetic shoe with custom inserts 3 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

## 2016-03-27 ENCOUNTER — Ambulatory Visit (HOSPITAL_COMMUNITY): Payer: Medicare Other

## 2016-03-27 ENCOUNTER — Encounter (HOSPITAL_COMMUNITY): Payer: Self-pay

## 2016-03-27 DIAGNOSIS — R278 Other lack of coordination: Secondary | ICD-10-CM

## 2016-03-27 DIAGNOSIS — R29898 Other symptoms and signs involving the musculoskeletal system: Secondary | ICD-10-CM | POA: Diagnosis not present

## 2016-03-27 NOTE — Therapy (Signed)
[place], [name], [address] Phone: [phone]   Fax:  [phone]  Occupational Therapy Treatment  Patient Details  Name: Gerald Davenport MRN: [id number] Date of Birth: Aug 17, 1941 Referring Provider: Erling Cruz, MD  Encounter Date: 03/27/2016      OT End of Session - 03/27/16 1019    Visit Number 8   Number of Visits 12   Date for OT Re-Evaluation 04/08/16   Authorization Type [employer] Med[employer] advantage PPO   Authorization Time Period before 10th visit   Authorization - Visit Number 8   Authorization - Number of Visits 10   OT Start Time 0900   OT Stop Time 0945   OT Time Calculation (min) 45 min   Activity Tolerance Patient tolerated treatment well   Behavior During Therapy [facility] for tasks assessed/performed      Past Medical History  Diagnosis Date  . Hypertension   . Hyperlipidemia   . Cancer [facility])     Prostate cancer treated with radiation therapy  . Thyroid disease     hyperparathyroidism  . Anemia   . Positive PPD   . Clot 05/2006    Arterial clot right leg   . Hemodialysis patient ([place]) 6/13  . Diverticulosis   . Anxiety   . Chronic kidney disease     dialysis Mon, Wednes, Fri  . Shortness of breath dyspnea     with exertion, when he has too much fluid  . Constipation   . GERD (gastroesophageal reflux disease)   . Infection     drainage from distal incision of left upper arm arteriovenous graft  . Diabetes mellitus     Type II  . Steal syndrome dialysis vascular access ([place])   . Wears glasses     Past Surgical History  Procedure Laterality Date  . 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 Av fistula placement  11/04/2010    left and left  . Av fistula placement  11/20/2011    Procedure: INSERTION OF ARTERIOVENOUS (AV) GORE-TEX GRAFT ARM;  Surgeon: Angelia Mould, MD;  [facility];  Service: Vascular;  Laterality: Right;  . Colonoscopy N/A 11/10/2012    [id number]  proctitis/Pancolonic diverticulosis/(TUBULAR ADENOMA)5 mm polyp in the transverse segment/4 mm polyps in the base of the cecum and diminutive polyp in the descending segment. INADEQUATE BOWEL PREP  . Colonoscopy N/A 11/28/2013    RMR: Radiation proctitis. Colonic diverticulosis. Colonic polyps-removed as described above.   [name] N/A 10/08/2012    Procedure: [name];  Surgeon: Elam Dutch, MD;  Location: [facility] CATH LAB;  Service: Cardiovascular;  Laterality: N/A;  . Av fistula placement Right 04/19/2015    Procedure: Excision of infected RUA AVG;  Surgeon: Conrad Corsica, MD;  Location: [place];  Service: Vascular;  Laterality: Right;  . Bascilic vein transposition Left 06/19/2015    Procedure: FIRST STAGE BRACHIAL VEIN TRANSPOSITION;  Surgeon: Conrad Stearns, MD;  Location: [place];  Service: Vascular;  Laterality: Left;  . Av fistula placement Left 10/04/2015    Procedure: INSERTION OF ARTERIOVENOUS (AV) GORE-TEX GRAFT ARM;  Surgeon: Conrad [place], MD;  Location: [place];  Service: Vascular;  Laterality: Left;  . Avgg removal Left 10/13/2015    Procedure: REMOVAL OF INFECTED ARTERIOVENOUS GORETEX LEFT GRAFT ;  Surgeon: Conrad Rippey, MD;  Location: [place];  Service: Vascular;  Laterality: Left;  . Peripheral vascular catheterization Bilateral 12/06/2015  Procedure: Upper Extremity Venography/ central venogram;  Surgeon: Conrad Milan, MD;  Location: [place] CV LAB;  Service: Cardiovascular;  Laterality: Bilateral;  . Vascular access device insertion Right 01/01/2016    Procedure: INSERTION OF HERO VASCULAR ACCESS DEVICE - RIGHT;  Surgeon: Conrad Oliver, MD;  Location: [place];  Service: Vascular;  Laterality: Right;  . Removal of a hero device Right 01/19/2016    Procedure: REMOVAL OF A HERO DEVICE;  Surgeon: Elam Dutch, MD;  Location: [place];  Service: Vascular;  Laterality: Right;  . Insertion of dialysis catheter Right 01/19/2016    Procedure: INSERTION OF Right internal jugular  DIALYSIS CATHETER.;   Surgeon: Elam Dutch, MD;  Location: [place];  Service: Vascular;  Laterality: Right;    There were no vitals filed for this visit.      Subjective Assessment - 03/27/16 0933    Subjective  S: My hand is still numb.   Currently in Pain? No/denies            [facility] OT Assessment - 03/27/16 0933    Assessment   Diagnosis Ischemic injury to right hand   Precautions   Precautions None                  OT Treatments/Exercises (OP) - 03/27/16 0933    Exercises   Exercises Hand;Wrist   Fine Motor Coordination   Small Pegboard Pt completed colored pegboard (25% of pattern) with verbal and visual cues as well as set-up of  peg in tweezers due to visual deficits. Increased time to complete.   Additional Wrist Exercises   Sponges 18, 21   Hand Exercises   MCPJ Flexion AROM;10 reps   MCPJ Extension AROM;10 reps   PIPJ Flexion AROM;10 reps   PIPJ Extension AROM;10 reps   DIPJ Flexion AROM;10 reps   DIPJ Extension AROM;10 reps   Digit Composite ABduction AROM;10 reps   Digit Composite ADduction AROM;10 reps   Fine Motor Coordination   Fine Motor Coordination In hand manipuation training;Picking up coins;Manipulating coins;Stacking coins   Picking up coins Pt picked up pennies one at a time for a total of 5. Pt was unable to pick them up off the table and instead was required to slide them off the end of the table.    Manipulating coins Pt then used his thumb to bring one coin at time from his palm to finger tip. Max difficulty. Pt frequently wanted to shake coins versus using his thumb to move them.    Stacking coins Pt picked up stacked coins and placed them in the bank slot one at a time. Min difficulty.                   OT Short Term Goals - 02/27/16 1458    OT SHORT TERM GOAL #1   Title Patient will be educated and independent with HEP to increase functional use of RUE during daily tasks.    Time 3   Period Weeks   Status On-going   OT SHORT TERM  GOAL #2   Title Patient will increase A/ROM of right hand to 100% to increase ability to grasp household items fully as needed.    Time 3   Period Weeks   Status On-going   OT SHORT TERM GOAL #3   Title Patient will increase grip strength by 5# and pinch strength by 3# to increase ability to do buttons when getting dressed.  Time 3   Period Weeks   Status On-going   OT SHORT TERM GOAL #4   Title Patient will utilize desensitization techniques to decrease hypersensitivity in right hand in order to allow him to participate in more daily activities using hand for 50% or more of the tasks.    Time 3   Period Weeks   Status On-going           OT Long Term Goals - 02/27/16 1458    OT LONG TERM GOAL #1   Title Patient will return to highest level of independence using RUE for 75% or more of daily tasks.    Time 6   Period Weeks   Status On-going   OT LONG TERM GOAL #2   Title Patient will increase grip strength by 10# and pinch strength by 5# to increase ability to open food packets.    Time 6   Period Weeks   Status On-going   OT LONG TERM GOAL #3   Title Patient will increase coordination by completing the 9 hole peg test in 48 seconds or less with Right hand.    Time 6   Period Weeks   Status On-going               Plan - 03/27/16 1019    Clinical Impression Statement A: Session focused on in hand manipulation. Patient had the most difficulty during pegboard due to visual deficits although therapist did provide verbal and visual cues to help patient complete.    Plan P: Complete grooved pegboard.       Patient will benefit from skilled therapeutic intervention in order to improve the following deficits and impairments:  Decreased strength, Impaired sensation, Impaired UE functional use, Decreased range of motion, Decreased coordination  Visit Diagnosis: Other symptoms and signs involving the musculoskeletal system  Other lack of coordination    Problem  List Patient Active Problem List   Diagnosis Date Noted  . Steal syndrome of dialysis vascular access ([place]) 01/18/2016  . Infection 10/13/2015  . Infection of arteriovenous graft for hemodialysis ([place]) 10/12/2015  . ESRD on dialysis ([place]) 06/15/2015  . Diabetic neuropathy associated with type 2 diabetes mellitus ([place]) 05/25/2015  . Infection, dialysis vascular access ([place]) 04/19/2015  . Rectal bleeding 02/06/2015  . SOB (shortness of breath) 03/24/2014  . Abnormal EKG 03/24/2014  . Essential hypertension, benign 03/15/2014  . Mixed hyperlipidemia 03/15/2014  . H/O adenomatous polyp of colon 11/07/2013  . Constipation 06/15/2013  . Encounter for screening colonoscopy 11/02/2012  . Fecal incontinence 09/10/2012  . GERD (gastroesophageal reflux disease) 09/10/2012  . Diabetes mellitus 08/06/2011  . CRF (chronic renal failure) 08/06/2011    Ailene Ravel, OTR/L,CBIS  [phone]  03/27/2016, 10:21 AM  [place] [address] [place], [name], [address] Phone: [phone]   Fax:  [phone]  Name: ABDULQADIR DEMMON MRN: [id number] Date of Birth: 05/19/1941

## 2016-03-31 ENCOUNTER — Ambulatory Visit (HOSPITAL_COMMUNITY): Payer: Medicare Other | Attending: Nephrology

## 2016-03-31 ENCOUNTER — Encounter (HOSPITAL_COMMUNITY): Payer: Self-pay

## 2016-03-31 DIAGNOSIS — R278 Other lack of coordination: Secondary | ICD-10-CM | POA: Diagnosis present

## 2016-03-31 DIAGNOSIS — R29898 Other symptoms and signs involving the musculoskeletal system: Secondary | ICD-10-CM | POA: Diagnosis not present

## 2016-03-31 NOTE — Therapy (Signed)
[place], [name], [address] Phone: [phone]   Fax:  [phone]  Occupational Therapy Treatment  Patient Details  Name: Gerald Davenport MRN: [id number] Date of Birth: 1941/04/09 Referring Provider: Erling Cruz, MD  Encounter Date: 03/31/2016      OT End of Session - 03/31/16 1507    Visit Number 9   Number of Visits 12   Date for OT Re-Evaluation 04/08/16   Authorization Type [employer] advantage PPO   Authorization Time Period before 10th visit   Authorization - Visit Number 9   Authorization - Number of Visits 10   OT Start Time 1344   OT Stop Time 1430   OT Time Calculation (min) 46 min   Activity Tolerance Patient tolerated treatment well   Behavior During Therapy [facility] for tasks assessed/performed      Past Medical History  Diagnosis Date  . Hypertension   . Hyperlipidemia   . Cancer [facility])     Prostate cancer treated with radiation therapy  . Thyroid disease     hyperparathyroidism  . Anemia   . Positive PPD   . Clot 05/2006    Arterial clot right leg   . Hemodialysis patient ([place]) 6/13  . Diverticulosis   . Anxiety   . Chronic kidney disease     dialysis Mon, Wednes, Fri  . Shortness of breath dyspnea     with exertion, when he has too much fluid  . Constipation   . GERD (gastroesophageal reflux disease)   . Infection     drainage from distal incision of left upper arm arteriovenous graft  . Diabetes mellitus     Type II  . Steal syndrome dialysis vascular access ([place])   . Wears glasses     Past Surgical History  Procedure Laterality Date  . 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 Av fistula placement  11/04/2010    left and left  . Av fistula placement  11/20/2011    Procedure: INSERTION OF ARTERIOVENOUS (AV) GORE-TEX GRAFT ARM;  Surgeon: Angelia Mould, MD;  [facility];  Service: Vascular;  Laterality: Right;  . Colonoscopy N/A 11/10/2012    [id number]  proctitis/Pancolonic diverticulosis/(TUBULAR ADENOMA)5 mm polyp in the transverse segment/4 mm polyps in the base of the cecum and diminutive polyp in the descending segment. INADEQUATE BOWEL PREP  . Colonoscopy N/A 11/28/2013    RMR: Radiation proctitis. Colonic diverticulosis. Colonic polyps-removed as described above.   [name] N/A 10/08/2012    Procedure: [name];  Surgeon: Elam Dutch, MD;  Location: [facility] CATH LAB;  Service: Cardiovascular;  Laterality: N/A;  . Av fistula placement Right 04/19/2015    Procedure: Excision of infected RUA AVG;  Surgeon: Conrad Harwick, MD;  Location: [place];  Service: Vascular;  Laterality: Right;  . Bascilic vein transposition Left 06/19/2015    Procedure: FIRST STAGE BRACHIAL VEIN TRANSPOSITION;  Surgeon: Conrad Howard, MD;  Location: [place];  Service: Vascular;  Laterality: Left;  . Av fistula placement Left 10/04/2015    Procedure: INSERTION OF ARTERIOVENOUS (AV) GORE-TEX GRAFT ARM;  Surgeon: Conrad [place], MD;  Location: [place];  Service: Vascular;  Laterality: Left;  . Avgg removal Left 10/13/2015    Procedure: REMOVAL OF INFECTED ARTERIOVENOUS GORETEX LEFT GRAFT ;  Surgeon: Conrad Rolesville, MD;  Location: [place];  Service: Vascular;  Laterality: Left;  . Peripheral vascular catheterization Bilateral 12/06/2015  Procedure: Upper Extremity Venography/ central venogram;  Surgeon: Conrad Morton, MD;  Location: [place] CV LAB;  Service: Cardiovascular;  Laterality: Bilateral;  . Vascular access device insertion Right 01/01/2016    Procedure: INSERTION OF HERO VASCULAR ACCESS DEVICE - RIGHT;  Surgeon: Conrad [place], MD;  Location: [place];  Service: Vascular;  Laterality: Right;  . Removal of a hero device Right 01/19/2016    Procedure: REMOVAL OF A HERO DEVICE;  Surgeon: Elam Dutch, MD;  Location: [place];  Service: Vascular;  Laterality: Right;  . Insertion of dialysis catheter Right 01/19/2016    Procedure: INSERTION OF Right internal jugular  DIALYSIS CATHETER.;   Surgeon: Elam Dutch, MD;  Location: [place];  Service: Vascular;  Laterality: Right;    There were no vitals filed for this visit.      Subjective Assessment - 03/31/16 1400    Subjective  S: My hand is doing better. I just need to straighten this finger (Middle finger).   Currently in Pain? No/denies            [facility] OT Assessment - 03/31/16 1400    Assessment   Diagnosis Ischemic injury to right hand   Precautions   Precautions None                  OT Treatments/Exercises (OP) - 03/31/16 1400    Exercises   Exercises Hand;Wrist   Hand Exercises   Other Hand Exercises Completed resistive clothespins using LUE and 3 point pinch to place all colors. Pins then removed with a lateral pinch. Increased time to complete.   Fine Motor Coordination   Fine Motor Coordination Grooved pegs   Grooved pegs Pt completed grooved pegboard using LUE and max-mod visual cueing due to visual deficits. Increased time to complete due to vision.                   OT Short Term Goals - 02/27/16 1458    OT SHORT TERM GOAL #1   Title Patient will be educated and independent with HEP to increase functional use of RUE during daily tasks.    Time 3   Period Weeks   Status On-going   OT SHORT TERM GOAL #2   Title Patient will increase A/ROM of right hand to 100% to increase ability to grasp household items fully as needed.    Time 3   Period Weeks   Status On-going   OT SHORT TERM GOAL #3   Title Patient will increase grip strength by 5# and pinch strength by 3# to increase ability to do buttons when getting dressed.    Time 3   Period Weeks   Status On-going   OT SHORT TERM GOAL #4   Title Patient will utilize desensitization techniques to decrease hypersensitivity in right hand in order to allow him to participate in more daily activities using hand for 50% or more of the tasks.    Time 3   Period Weeks   Status On-going           OT Long Term Goals -  02/27/16 1458    OT LONG TERM GOAL #1   Title Patient will return to highest level of independence using RUE for 75% or more of daily tasks.    Time 6   Period Weeks   Status On-going   OT LONG TERM GOAL #2   Title Patient will increase grip strength by 10# and pinch strength  by 5# to increase ability to open food packets.    Time 6   Period Weeks   Status On-going   OT LONG TERM GOAL #3   Title Patient will increase coordination by completing the 9 hole peg test in 48 seconds or less with Right hand.    Time 6   Period Weeks   Status On-going               Plan - 03/31/16 1507    Clinical Impression Statement A: Pt completed grooved pegboard with min difficulty related to coordination. Most difficulty due to visual deficits. Verbal and visual cueing needed.    Plan P: Update G-code and complete reassessment.      Patient will benefit from skilled therapeutic intervention in order to improve the following deficits and impairments:  Decreased strength, Impaired sensation, Impaired UE functional use, Decreased range of motion, Decreased coordination  Visit Diagnosis: Other symptoms and signs involving the musculoskeletal system  Other lack of coordination    Problem List Patient Active Problem List   Diagnosis Date Noted  . Steal syndrome of dialysis vascular access ([place]) 01/18/2016  . Infection 10/13/2015  . Infection of arteriovenous graft for hemodialysis ([place]) 10/12/2015  . ESRD on dialysis ([place]) 06/15/2015  . Diabetic neuropathy associated with type 2 diabetes mellitus ([place]) 05/25/2015  . Infection, dialysis vascular access ([place]) 04/19/2015  . Rectal bleeding 02/06/2015  . SOB (shortness of breath) 03/24/2014  . Abnormal EKG 03/24/2014  . Essential hypertension, benign 03/15/2014  . Mixed hyperlipidemia 03/15/2014  . H/O adenomatous polyp of colon 11/07/2013  . Constipation 06/15/2013  . Encounter for screening colonoscopy 11/02/2012  . Fecal incontinence  09/10/2012  . GERD (gastroesophageal reflux disease) 09/10/2012  . Diabetes mellitus 08/06/2011  . CRF (chronic renal failure) 08/06/2011    Ailene Ravel, OTR/L,CBIS  [phone]  03/31/2016, 3:16 PM  [place] [address] [place], [name], [address] Phone: [phone]   Fax:  [phone]  Name: Gerald Davenport MRN: [id number] Date of Birth: 1941-09-15

## 2016-04-03 ENCOUNTER — Encounter (HOSPITAL_COMMUNITY): Payer: Self-pay

## 2016-04-03 ENCOUNTER — Ambulatory Visit (HOSPITAL_COMMUNITY): Payer: Medicare Other

## 2016-04-03 DIAGNOSIS — R278 Other lack of coordination: Secondary | ICD-10-CM

## 2016-04-03 DIAGNOSIS — R29898 Other symptoms and signs involving the musculoskeletal system: Secondary | ICD-10-CM

## 2016-04-03 NOTE — Therapy (Signed)
[facility] [address] [place], [place], [address] Phone: [phone]   Fax:  [phone]  Occupational Therapy Treatment And reassessment/recertification Patient Details  Name: Gerald Davenport MRN: [id number] Date of Birth: 12/04/1940 Referring Provider: Casimiro Needle, MD  Encounter Date: 04/03/2016      OT End of Session - 04/03/16 1014    Visit Number 10   Number of Visits 12   Date for OT Re-Evaluation 04/17/16   Authorization Type [employer] Med[employer] advantage PPO   Authorization Time Period before 12th visit   Authorization - Visit Number 10   Authorization - Number of Visits 20   OT Start Time 0900  reassessment   OT Stop Time 0945   OT Time Calculation (min) 45 min   Activity Tolerance Patient tolerated treatment well   Behavior During Therapy [facility] for tasks assessed/performed      Past Medical History  Diagnosis Date  . Hypertension   . Hyperlipidemia   . Cancer [facility])     Prostate cancer treated with radiation therapy  . Thyroid disease     hyperparathyroidism  . Anemia   . Positive PPD   . Clot 05/2006    Arterial clot right leg   . Hemodialysis patient ([facility]) 6/13  . Diverticulosis   . Anxiety   . Chronic kidney disease     dialysis Mon, Wednes, Fri  . Shortness of breath dyspnea     with exertion, when he has too much fluid  . Constipation   . GERD (gastroesophageal reflux disease)   . Infection     drainage from distal incision of left upper arm arteriovenous graft  . Diabetes mellitus     Type II  . Steal syndrome dialysis vascular access ([facility])   . Wears glasses     Past Surgical History  Procedure Laterality Date  . 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 Av fistula placement  11/04/2010    left and left  . Av fistula placement  11/20/2011    Procedure: INSERTION OF ARTERIOVENOUS (AV) GORE-TEX GRAFT ARM;  Surgeon: Chuck Hint, MD;  [facility];  Service: Vascular;  Laterality: Right;  .  Colonoscopy N/A 11/10/2012    EDG:[id number] proctitis/Pancolonic diverticulosis/(TUBULAR ADENOMA)5 mm polyp in the transverse segment/4 mm polyps in the base of the cecum and diminutive polyp in the descending segment. INADEQUATE BOWEL PREP  . Colonoscopy N/A 11/28/2013    RMR: Radiation proctitis. Colonic diverticulosis. Colonic polyps-removed as described above.   [name] N/A 10/08/2012    Procedure: [name];  Surgeon: Sherren Kerns, MD;  Location: [facility] CATH LAB;  Service: Cardiovascular;  Laterality: N/A;  . Av fistula placement Right 04/19/2015    Procedure: Excision of infected RUA AVG;  Surgeon: [name], MD;  Location: [facility] OR;  Service: Vascular;  Laterality: Right;  . Bascilic vein transposition Left 06/19/2015    Procedure: FIRST STAGE BRACHIAL VEIN TRANSPOSITION;  Surgeon: [name], MD;  Location: [facility] OR;  Service: Vascular;  Laterality: Left;  . Av fistula placement Left 10/04/2015    Procedure: INSERTION OF ARTERIOVENOUS (AV) GORE-TEX GRAFT ARM;  Surgeon: [name], MD;  [facility];  Service: Vascular;  Laterality: Left;  . Avgg removal Left 10/13/2015    Procedure: REMOVAL OF INFECTED ARTERIOVENOUS GORETEX LEFT GRAFT ;  Surgeon: [name], MD;  Location: [facility] OR;  Service: Vascular;  Laterality: Left;  . Peripheral vascular catheterization Bilateral  12/06/2015    Procedure: Upper Extremity Venography/ central venogram;  Surgeon: Conrad Churchill, MD;  Location: [place] CV LAB;  Service: Cardiovascular;  Laterality: Bilateral;  . Vascular access device insertion Right 01/01/2016    Procedure: INSERTION OF HERO VASCULAR ACCESS DEVICE - RIGHT;  Surgeon: Conrad [place], MD;  Location: [place];  Service: Vascular;  Laterality: Right;  . Removal of a hero device Right 01/19/2016    Procedure: REMOVAL OF A HERO DEVICE;  Surgeon: Elam Dutch, MD;  Location: [place];  Service: Vascular;  Laterality: Right;  . Insertion of dialysis catheter Right 01/19/2016    Procedure:  INSERTION OF Right internal jugular  DIALYSIS CATHETER.;  Surgeon: Elam Dutch, MD;  Location: [place];  Service: Vascular;  Laterality: Right;    There were no vitals filed for this visit.      Subjective Assessment - 04/03/16 1009    Subjective  S: I think I use my right hand for 50% of my activities during the day.   Currently in Pain? No/denies  "Just numb"            [facility] OT Assessment - 04/03/16 0902    Assessment   Diagnosis Ischemic injury to right hand   Precautions   Precautions None   Coordination   9 Hole Peg Test Right   Right 9 Hole Peg Test 46.3"  previous: 1'05"   AROM   Right/Left Finger Right   Right Composite Finger Flexion --  100% On eval: 75%   Right/Left Thumb Right   Right Thumb Opposition Digit 5  previous: digit 4   Strength   Right/Left hand Right   Right Hand Grip (lbs) 50  previous: 35   Right Hand Lateral Pinch 8 lbs  previous: 3   Right Hand 3 Point Pinch 4 lbs  previous: 2                  OT Treatments/Exercises (OP) - 04/03/16 0926    Exercises   Exercises Hand;Wrist   Additional Wrist Exercises   Sponges Pt utilized yellow clothespin a 3 point pinch to pick up 25 high resistance sponges one at a time and place in container.    Theraputty - Roll red   Theraputty - Grip red   Theraputty - Pinch red - 3 point pinch and lateral   Hand Exercises   Other Hand Exercises Completed resistive clothespin activity utilizing a 3 point pinch with left hand. pt able to place yellow, red, and green.  Removed with 3 point pinch in right hand as well.                  OT Education - 04/03/16 1013    Education provided Yes   Education Details patient given red theraputty to upgrade hand exercises.   Person(s) Educated Patient   Methods Explanation   Comprehension Verbalized understanding          OT Short Term Goals - 04/03/16 0909    OT SHORT TERM GOAL #1   Title Patient will be educated and independent with  HEP to increase functional use of RUE during daily tasks.    Time 3   Period Weeks   Status Achieved   OT SHORT TERM GOAL #2   Title Patient will increase A/ROM of right hand to 100% to increase ability to grasp household items fully as needed.    Time 3   Period Weeks   Status  Achieved   OT SHORT TERM GOAL #3   Title Patient will increase grip strength by 5# and pinch strength by 3# to increase ability to do buttons when getting dressed.    Time 3   Period Weeks   Status Achieved   OT SHORT TERM GOAL #4   Title Patient will utilize desensitization techniques to decrease hypersensitivity in right hand in order to allow him to participate in more daily activities using hand for 50% or more of the tasks.    Time 3   Period Weeks   Status Achieved           OT Long Term Goals - 04/03/16 0911    OT LONG TERM GOAL #1   Title Patient will return to highest level of independence using RUE for 75% or more of daily tasks.    Time 6   Period Weeks   Status On-going   OT LONG TERM GOAL #2   Title Patient will increase grip strength by 10# and pinch strength by 5# to increase ability to open food packets.    Time 6   Period Weeks   Status Partially Met   OT LONG TERM GOAL #3   Title Patient will increase coordination by completing the 9 hole peg test in 48 seconds or less with Right hand.    Time 6   Period Weeks   Status Achieved               Plan - 04/03/16 1019    Clinical Impression Statement A: Reassessment completed this date. patient has met all short term goals and 1/3 long term goals with a 2nd goal partially met. Patient reports that he uses his right hand for 50% of daily tasks. Patient has shown great improve with grip and lateral pinch strength as well as A/ROM. Pt continues to have deficts with 3 point pinch and using RUE for only 50% of daily tasks.   Plan P: Continue therapy 2X a week for 2 more weeks focusing on 3 point pinch and utilizing RUE more for daily  tasks.      Patient will benefit from skilled therapeutic intervention in order to improve the following deficits and impairments:  Decreased strength, Impaired sensation, Impaired UE functional use, Decreased range of motion, Decreased coordination  Visit Diagnosis: Other symptoms and signs involving the musculoskeletal system - Plan: Ot plan of care cert/re-cert  Other lack of coordination - Plan: Ot plan of care cert/re-cert      G-Codes - 40/34/74 1018    Functional Assessment Tool Used FOTO score: 60/100 (40% impaired)   Functional Limitation Carrying, moving and handling objects   Carrying, Moving and Handling Objects Current Status (Q5956) At least 40 percent but less than 60 percent impaired, limited or restricted   Carrying, Moving and Handling Objects Goal Status (L8756) At least 20 percent but less than 40 percent impaired, limited or restricted      Problem List Patient Active Problem List   Diagnosis Date Noted  . Steal syndrome of dialysis vascular access ([place]) 01/18/2016  . Infection 10/13/2015  . Infection of arteriovenous graft for hemodialysis ([place]) 10/12/2015  . ESRD on dialysis ([place]) 06/15/2015  . Diabetic neuropathy associated with type 2 diabetes mellitus ([place]) 05/25/2015  . Infection, dialysis vascular access ([place]) 04/19/2015  . Rectal bleeding 02/06/2015  . SOB (shortness of breath) 03/24/2014  . Abnormal EKG 03/24/2014  . Essential hypertension, benign 03/15/2014  . Mixed hyperlipidemia 03/15/2014  .  H/O adenomatous polyp of colon 11/07/2013  . Constipation 06/15/2013  . Encounter for screening colonoscopy 11/02/2012  . Fecal incontinence 09/10/2012  . GERD (gastroesophageal reflux disease) 09/10/2012  . Diabetes mellitus 08/06/2011  . CRF (chronic renal failure) 08/06/2011    Ailene Ravel, OTR/L,CBIS  [phone]  04/03/2016, 10:28 AM  [place] [address] [place], [name], [address] Phone:  [phone]   Fax:  [phone]  Name: DAMIRE REMEDIOS MRN: [id number] Date of Birth: 09-12-1941

## 2016-04-07 ENCOUNTER — Encounter (HOSPITAL_COMMUNITY): Payer: Medicare Other | Admitting: Specialist

## 2016-04-08 ENCOUNTER — Encounter (HOSPITAL_COMMUNITY): Payer: Self-pay

## 2016-04-08 ENCOUNTER — Ambulatory Visit (HOSPITAL_COMMUNITY): Payer: Medicare Other

## 2016-04-08 DIAGNOSIS — R29898 Other symptoms and signs involving the musculoskeletal system: Secondary | ICD-10-CM | POA: Diagnosis not present

## 2016-04-08 DIAGNOSIS — R278 Other lack of coordination: Secondary | ICD-10-CM

## 2016-04-08 NOTE — Therapy (Signed)
[place], [name], [address] Phone: [phone]   Fax:  [phone]  Occupational Therapy Treatment  Patient Details  Name: Gerald Davenport MRN: [id number] Date of Birth: 1940/12/07 Referring Provider: Erling Cruz, MD  Encounter Date: 04/08/2016      OT End of Session - 04/08/16 1145    Visit Number 11   Number of Visits 12   Date for OT Re-Evaluation 04/17/16   Authorization Type [employer] advantage PPO   Authorization Time Period before 12th visit   Authorization - Visit Number 11   Authorization - Number of Visits 20   OT Start Time 0950   OT Stop Time 1030   OT Time Calculation (min) 40 min   Activity Tolerance Patient tolerated treatment well   Behavior During Therapy [facility] for tasks assessed/performed      Past Medical History  Diagnosis Date  . Hypertension   . Hyperlipidemia   . Cancer [facility])     Prostate cancer treated with radiation therapy  . Thyroid disease     hyperparathyroidism  . Anemia   . Positive PPD   . Clot 05/2006    Arterial clot right leg   . Hemodialysis patient ([place]) 6/13  . Diverticulosis   . Anxiety   . Chronic kidney disease     dialysis Mon, Wednes, Fri  . Shortness of breath dyspnea     with exertion, when he has too much fluid  . Constipation   . GERD (gastroesophageal reflux disease)   . Infection     drainage from distal incision of left upper arm arteriovenous graft  . Diabetes mellitus     Type II  . Steal syndrome dialysis vascular access ([place])   . Wears glasses     Past Surgical History  Procedure Laterality Date  . 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 Av fistula placement  11/04/2010    left and left  . Av fistula placement  11/20/2011    Procedure: INSERTION OF ARTERIOVENOUS (AV) GORE-TEX GRAFT ARM;  Surgeon: Angelia Mould, MD;  [facility];  Service: Vascular;  Laterality: Right;  . Colonoscopy N/A 11/10/2012     TFT:[id number] proctitis/Pancolonic diverticulosis/(TUBULAR ADENOMA)5 mm polyp in the transverse segment/4 mm polyps in the base of the cecum and diminutive polyp in the descending segment. INADEQUATE BOWEL PREP  . Colonoscopy N/A 11/28/2013    RMR: Radiation proctitis. Colonic diverticulosis. Colonic polyps-removed as described above.   [name] N/A 10/08/2012    Procedure: [name];  Surgeon: Elam Dutch, MD;  Location: [facility] CATH LAB;  Service: Cardiovascular;  Laterality: N/A;  . Av fistula placement Right 04/19/2015    Procedure: Excision of infected RUA AVG;  Surgeon: Conrad Mokane, MD;  Location: [place];  Service: Vascular;  Laterality: Right;  . Bascilic vein transposition Left 06/19/2015    Procedure: FIRST STAGE BRACHIAL VEIN TRANSPOSITION;  Surgeon: Conrad Butler, MD;  Location: [place];  Service: Vascular;  Laterality: Left;  . Av fistula placement Left 10/04/2015    Procedure: INSERTION OF ARTERIOVENOUS (AV) GORE-TEX GRAFT ARM;  Surgeon: Conrad Fromberg, MD;  Location: [place];  Service: Vascular;  Laterality: Left;  . Avgg removal Left 10/13/2015    Procedure: REMOVAL OF INFECTED ARTERIOVENOUS GORETEX LEFT GRAFT ;  Surgeon: Conrad Bressler, MD;  Location: [place];  Service: Vascular;  Laterality: Left;  . Peripheral vascular catheterization Bilateral 12/06/2015  Procedure: Upper Extremity Venography/ central venogram;  Surgeon: Conrad Budd Lake, MD;  Location: [place] CV LAB;  Service: Cardiovascular;  Laterality: Bilateral;  . Vascular access device insertion Right 01/01/2016    Procedure: INSERTION OF HERO VASCULAR ACCESS DEVICE - RIGHT;  Surgeon: Conrad Mud Lake, MD;  Location: [place];  Service: Vascular;  Laterality: Right;  . Removal of a hero device Right 01/19/2016    Procedure: REMOVAL OF A HERO DEVICE;  Surgeon: Elam Dutch, MD;  Location: [place];  Service: Vascular;  Laterality: Right;  . Insertion of dialysis catheter Right 01/19/2016    Procedure: INSERTION OF Right internal jugular   DIALYSIS CATHETER.;  Surgeon: Elam Dutch, MD;  Location: [place];  Service: Vascular;  Laterality: Right;    There were no vitals filed for this visit.                    OT Treatments/Exercises (OP) - 04/08/16 0953    Exercises   Exercises Hand;Wrist   Additional Wrist Exercises   Sponges 8,22   Theraputty Roll;Grip   Theraputty - Roll red   Theraputty - Grip red   Hand Exercises   Other Hand Exercises Completed a 3 point pinch activity using the red resistive clothespin to pick up 30 high resistance sponges one at  a time. Max difficulty.                  OT Short Term Goals - 04/03/16 0909    OT SHORT TERM GOAL #1   Title Patient will be educated and independent with HEP to increase functional use of RUE during daily tasks.    Time 3   Period Weeks   Status Achieved   OT SHORT TERM GOAL #2   Title Patient will increase A/ROM of right hand to 100% to increase ability to grasp household items fully as needed.    Time 3   Period Weeks   Status Achieved   OT SHORT TERM GOAL #3   Title Patient will increase grip strength by 5# and pinch strength by 3# to increase ability to do buttons when getting dressed.    Time 3   Period Weeks   Status Achieved   OT SHORT TERM GOAL #4   Title Patient will utilize desensitization techniques to decrease hypersensitivity in right hand in order to allow him to participate in more daily activities using hand for 50% or more of the tasks.    Time 3   Period Weeks   Status Achieved           OT Long Term Goals - 04/03/16 0911    OT LONG TERM GOAL #1   Title Patient will return to highest level of independence using RUE for 75% or more of daily tasks.    Time 6   Period Weeks   Status On-going   OT LONG TERM GOAL #2   Title Patient will increase grip strength by 10# and pinch strength by 5# to increase ability to open food packets.    Time 6   Period Weeks   Status Partially Met   OT LONG TERM GOAL #3    Title Patient will increase coordination by completing the 9 hole peg test in 48 seconds or less with Right hand.    Time 6   Period Weeks   Status Achieved               Plan - 04/08/16  1145    Clinical Impression Statement A: focused on 3 point pinch during session. Pt has max difficulty with activity due to weakness. VC for form and technique when pinching clothespin.    Plan P: Continue to focus on 3 point pinch strength.       Patient will benefit from skilled therapeutic intervention in order to improve the following deficits and impairments:  Decreased strength, Impaired sensation, Impaired UE functional use, Decreased range of motion, Decreased coordination  Visit Diagnosis: Other symptoms and signs involving the musculoskeletal system  Other lack of coordination    Problem List Patient Active Problem List   Diagnosis Date Noted  . 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 Infection 10/13/2015  . Infection of arteriovenous graft for hemodialysis ([place]) 10/12/2015  . ESRD on dialysis ([place]) 06/15/2015  . Diabetic neuropathy associated with type 2 diabetes mellitus ([place]) 05/25/2015  . Infection, dialysis vascular access ([place]) 04/19/2015  . Rectal bleeding 02/06/2015  . SOB (shortness of breath) 03/24/2014  . Abnormal EKG 03/24/2014  . Essential hypertension, benign 03/15/2014  . Mixed hyperlipidemia 03/15/2014  . H/O adenomatous polyp of colon 11/07/2013  . Constipation 06/15/2013  . Encounter for screening colonoscopy 11/02/2012  . Fecal incontinence 09/10/2012  . GERD (gastroesophageal reflux disease) 09/10/2012  . Diabetes mellitus 08/06/2011  . CRF (chronic renal failure) 08/06/2011    Ailene Ravel, OTR/L,CBIS  [phone]  04/08/2016, 11:47 AM  [facility] [address] [place], [name], [address] Phone: [phone]   Fax:  [phone]  Name: Gerald Davenport MRN: [id number] Date  of Birth: January 02, 1941

## 2016-04-10 ENCOUNTER — Encounter (HOSPITAL_COMMUNITY): Payer: Medicare Other | Admitting: Occupational Therapy

## 2016-04-10 ENCOUNTER — Encounter (HOSPITAL_COMMUNITY): Payer: Self-pay | Admitting: Occupational Therapy

## 2016-04-10 ENCOUNTER — Ambulatory Visit (HOSPITAL_COMMUNITY): Payer: Medicare Other | Admitting: Occupational Therapy

## 2016-04-10 DIAGNOSIS — R29898 Other symptoms and signs involving the musculoskeletal system: Secondary | ICD-10-CM | POA: Diagnosis not present

## 2016-04-10 DIAGNOSIS — R278 Other lack of coordination: Secondary | ICD-10-CM

## 2016-04-10 NOTE — Therapy (Signed)
[place], [name], [address] Phone: [phone]   Fax:  [phone]  Occupational Therapy Treatment  Patient Details  Name: Gerald Davenport MRN: [id number] Date of Birth: 03-Mar-1941 Referring Provider: Erling Cruz, MD  Encounter Date: 04/10/2016      OT End of Session - 04/10/16 1200    Visit Number 12   Number of Visits 14   Date for OT Re-Evaluation 04/17/16   Authorization Type [employer] Med[employer] advantage PPO   Authorization Time Period before 20th visit   Authorization - Visit Number 12   Authorization - Number of Visits 20   OT Start Time 1120   OT Stop Time 1202   OT Time Calculation (min) 42 min   Activity Tolerance Patient tolerated treatment well   Behavior During Therapy [facility] for tasks assessed/performed      Past Medical History  Diagnosis Date  . Hypertension   . Hyperlipidemia   . Cancer [facility])     Prostate cancer treated with radiation therapy  . Thyroid disease     hyperparathyroidism  . Anemia   . Positive PPD   . Clot 05/2006    Arterial clot right leg   . Hemodialysis patient ([place]) 6/13  . Diverticulosis   . Anxiety   . Chronic kidney disease     dialysis Mon, Wednes, Fri  . Shortness of breath dyspnea     with exertion, when he has too much fluid  . Constipation   . GERD (gastroesophageal reflux disease)   . Infection     drainage from distal incision of left upper arm arteriovenous graft  . Diabetes mellitus     Type II  . Steal syndrome dialysis vascular access ([place])   . Wears glasses     Past Surgical History  Procedure Laterality Date  . 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 Av fistula placement  11/04/2010    left and left  . Av fistula placement  11/20/2011    Procedure: INSERTION OF ARTERIOVENOUS (AV) GORE-TEX GRAFT ARM;  Surgeon: Angelia Mould, MD;  [facility];  Service: Vascular;  Laterality: Right;  . Colonoscopy N/A 11/10/2012   [id number] proctitis/Pancolonic diverticulosis/(TUBULAR ADENOMA)5 mm polyp in the transverse segment/4 mm polyps in the base of the cecum and diminutive polyp in the descending segment. INADEQUATE BOWEL PREP  . Colonoscopy N/A 11/28/2013    RMR: Radiation proctitis. Colonic diverticulosis. Colonic polyps-removed as described above.   [name] N/A 10/08/2012    Procedure: [name];  Surgeon: Elam Dutch, MD;  Location: [facility] CATH LAB;  Service: Cardiovascular;  Laterality: N/A;  . Av fistula placement Right 04/19/2015    Procedure: Excision of infected RUA AVG;  Surgeon: Conrad Elsah, MD;  Location: [place];  Service: Vascular;  Laterality: Right;  . Bascilic vein transposition Left 06/19/2015    Procedure: FIRST STAGE BRACHIAL VEIN TRANSPOSITION;  Surgeon: Conrad P[place], MD;  Location: [place];  Service: Vascular;  Laterality: Left;  . Av fistula placement Left 10/04/2015    Procedure: INSERTION OF ARTERIOVENOUS (AV) GORE-TEX GRAFT ARM;  Surgeon: Conrad [place], MD;  Location: [place];  Service: Vascular;  Laterality: Left;  . Avgg removal Left 10/13/2015    Procedure: REMOVAL OF INFECTED ARTERIOVENOUS GORETEX LEFT GRAFT ;  Surgeon: Conrad [place], MD;  [place];  Service: Vascular;  Laterality: Left;  . Peripheral vascular catheterization Bilateral 12/06/2015    Procedure:  Upper Extremity Venography/ central venogram;  Surgeon: Conrad [place], MD;  Location: [place] CV LAB;  Service: Cardiovascular;  Laterality: Bilateral;  . Vascular access device insertion Right 01/01/2016    Procedure: INSERTION OF HERO VASCULAR ACCESS DEVICE - RIGHT;  Surgeon: Conrad [place], MD;  Location: [place];  Service: Vascular;  Laterality: Right;  . Removal of a hero device Right 01/19/2016    Procedure: REMOVAL OF A HERO DEVICE;  Surgeon: Elam Dutch, MD;  Location: [place];  Service: Vascular;  Laterality: Right;  . Insertion of dialysis catheter Right 01/19/2016    Procedure: INSERTION OF Right internal jugular   DIALYSIS CATHETER.;  Surgeon: Elam Dutch, MD;  Location: [place];  Service: Vascular;  Laterality: Right;    There were no vitals filed for this visit.      Subjective Assessment - 04/10/16 1125    Subjective  S: It's not painful, just a little burning on the inside of my finger.    Currently in Pain? No/denies                      OT Treatments/Exercises (OP) - 04/10/16 1125    Exercises   Exercises Hand;Wrist   Additional Wrist Exercises   Theraputty Roll;Pinch   Theraputty - Roll red   Theraputty - Pinch red - 3 point pinch and lateral   Theraputty - Locate Pegs red-5/5 beads located with mod difficulty   Hand Exercises   Other Hand Exercises Completed a 3 point pinch activity using the red and yellow resistive clothespins to pick up 30 high resistance sponges one at  a time. Max difficulty with red, min difficulty with yellow. 8/30 with red, 22/30 with yellow   Other Hand Exercises Pt used hand gripper in a 3 point pinch with RUE to grasp beads and place in bucket. Max difficulty with gripper set at 11# for large beads. 3/6 large beads at 11#, 3/6 large beads at 7#.                   OT Short Term Goals - 04/03/16 0909    OT SHORT TERM GOAL #1   Title Patient will be educated and independent with HEP to increase functional use of RUE during daily tasks.    Time 3   Period Weeks   Status Achieved   OT SHORT TERM GOAL #2   Title Patient will increase A/ROM of right hand to 100% to increase ability to grasp household items fully as needed.    Time 3   Period Weeks   Status Achieved   OT SHORT TERM GOAL #3   Title Patient will increase grip strength by 5# and pinch strength by 3# to increase ability to do buttons when getting dressed.    Time 3   Period Weeks   Status Achieved   OT SHORT TERM GOAL #4   Title Patient will utilize desensitization techniques to decrease hypersensitivity in right hand in order to allow him to participate in more  daily activities using hand for 50% or more of the tasks.    Time 3   Period Weeks   Status Achieved           OT Long Term Goals - 04/03/16 0911    OT LONG TERM GOAL #1   Title Patient will return to highest level of independence using RUE for 75% or more of daily tasks.    Time 6  Period Weeks   Status On-going   OT LONG TERM GOAL #2   Title Patient will increase grip strength by 10# and pinch strength by 5# to increase ability to open food packets.    Time 6   Period Weeks   Status Partially Met   OT LONG TERM GOAL #3   Title Patient will increase coordination by completing the 9 hole peg test in 48 seconds or less with Right hand.    Time 6   Period Weeks   Status Achieved               Plan - 04/10/16 1208    Clinical Impression Statement A: Session focused on 3 point pinch, continued with resistive clothespins, pt had max difficulty with red resistive clothespins, mod difficulty with theraputtty task. Verbal cuing to relax shoulder and upper arm during pinch tasks and to complete with elbow by his side.    Rehab Potential Excellent   OT Frequency 2x / week   OT Duration 6 weeks   OT Treatment/Interventions Self-care/ADL training;Ultrasound;DME and/or AE instruction;Passive range of motion;Patient/family education;Cryotherapy;Parrafin;Electrical Stimulation;Moist Heat;Neuromuscular education;Therapeutic activities;Therapeutic exercises;Manual Therapy;Splinting   Plan P: Continue working towards remaining goals, focusing on pinch strength and using RUE during daily tasks.    Consulted and Agree with Plan of Care Patient      Patient will benefit from skilled therapeutic intervention in order to improve the following deficits and impairments:  Decreased strength, Impaired sensation, Impaired UE functional use, Decreased range of motion, Decreased coordination  Visit Diagnosis: Other symptoms and signs involving the musculoskeletal system  Other lack of  coordination    Problem List Patient Active Problem List   Diagnosis Date Noted  . Steal syndrome of dialysis vascular access ([place]) 01/18/2016  . Infection 10/13/2015  . Infection of arteriovenous graft for hemodialysis ([place]) 10/12/2015  . ESRD on dialysis ([place]) 06/15/2015  . Diabetic neuropathy associated with type 2 diabetes mellitus ([place]) 05/25/2015  . Infection, dialysis vascular access ([place]) 04/19/2015  . Rectal bleeding 02/06/2015  . SOB (shortness of breath) 03/24/2014  . Abnormal EKG 03/24/2014  . Essential hypertension, benign 03/15/2014  . Mixed hyperlipidemia 03/15/2014  . H/O adenomatous polyp of colon 11/07/2013  . Constipation 06/15/2013  . Encounter for screening colonoscopy 11/02/2012  . Fecal incontinence 09/10/2012  . GERD (gastroesophageal reflux disease) 09/10/2012  . Diabetes mellitus 08/06/2011  . CRF (chronic renal failure) 08/06/2011    Guadelupe Sabin, OTR/L  [phone]  04/10/2016, 12:11 PM  [facility] [address] [place], [name], [address] Phone: [phone]   Fax:  [phone]  Name: ELRAY DAINS MRN: [id number] Date of Birth: [birth year]

## 2016-04-15 ENCOUNTER — Encounter (HOSPITAL_COMMUNITY): Payer: Medicare Other

## 2016-04-17 ENCOUNTER — Ambulatory Visit (HOSPITAL_COMMUNITY): Payer: Medicare Other

## 2016-04-17 ENCOUNTER — Encounter (HOSPITAL_COMMUNITY): Payer: Self-pay

## 2016-04-17 DIAGNOSIS — R29898 Other symptoms and signs involving the musculoskeletal system: Secondary | ICD-10-CM

## 2016-04-17 DIAGNOSIS — R278 Other lack of coordination: Secondary | ICD-10-CM

## 2016-04-17 NOTE — Therapy (Signed)
[facility] [address] [place], [place], [address] Phone: [phone]   Fax:  [phone]  Occupational Therapy Treatment  Patient Details  Name: Gerald Davenport MRN: [id number] Date of Birth: [birth year] Referring Provider: Casimiro Needle, MD  Encounter Date: 04/17/2016      OT End of Session - 04/17/16 1303    Visit Number 13   Number of Visits 14   Date for OT Re-Evaluation 01/22/16   Authorization Type [employer] Med[employer] advantage PPO   Authorization Time Period before 20th visit   Authorization - Visit Number 12   Authorization - Number of Visits 20   OT Start Time 1033   OT Stop Time 1115   OT Time Calculation (min) 42 min   Activity Tolerance Patient tolerated treatment well   Behavior During Therapy [facility] for tasks assessed/performed      Past Medical History  Diagnosis Date  . Hypertension   . Hyperlipidemia   . Cancer [facility])     Prostate cancer treated with radiation therapy  . Thyroid disease     hyperparathyroidism  . Anemia   . Positive PPD   . Clot 05/2006    Arterial clot right leg   . Hemodialysis patient (HCC) 6/13  . Diverticulosis   . Anxiety   . Chronic kidney disease     dialysis Mon, Wednes, Fri  . Shortness of breath dyspnea     with exertion, when he has too much fluid  . Constipation   . GERD (gastroesophageal reflux disease)   . Infection     drainage from distal incision of left upper arm arteriovenous graft  . Diabetes mellitus     Type II  . Steal syndrome dialysis vascular access (HCC)   . Wears glasses     Past Surgical History  Procedure Laterality Date  . 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 Av fistula placement  11/04/2010    left and left  . Av fistula placement  11/20/2011    Procedure: INSERTION OF ARTERIOVENOUS (AV) GORE-TEX GRAFT ARM;  Surgeon: Chuck Hint, MD;  [facility]: [facility] OR;  Service: Vascular;  Laterality: Right;  . Colonoscopy N/A 11/10/2012   XKD:[id number] proctitis/Pancolonic diverticulosis/(TUBULAR ADENOMA)5 mm polyp in the transverse segment/4 mm polyps in the base of the cecum and diminutive polyp in the descending segment. INADEQUATE BOWEL PREP  . Colonoscopy N/A 11/28/2013    RMR: Radiation proctitis. Colonic diverticulosis. Colonic polyps-removed as described above.   [name] N/A 10/08/2012    Procedure: [name];  Surgeon: Sherren Kerns, MD;  Location: [facility] CATH LAB;  Service: Cardiovascular;  Laterality: N/A;  . Av fistula placement Right 04/19/2015    Procedure: Excision of infected RUA AVG;  Surgeon: [name], MD;  Location: [facility] OR;  Service: Vascular;  Laterality: Right;  . Bascilic vein transposition Left 06/19/2015    Procedure: FIRST STAGE BRACHIAL VEIN TRANSPOSITION;  Surgeon: [name], MD;  Location: [facility] OR;  Service: Vascular;  Laterality: Left;  . Av fistula placement Left 10/04/2015    Procedure: INSERTION OF ARTERIOVENOUS (AV) GORE-TEX GRAFT ARM;  Surgeon: [name], MD;  Location: [facility] OR;  Service: Vascular;  Laterality: Left;  . Avgg removal Left 10/13/2015    Procedure: REMOVAL OF INFECTED ARTERIOVENOUS GORETEX LEFT GRAFT ;  Surgeon: [name], MD;  Location: [facility] OR;  Service: Vascular;  Laterality: Left;  . Peripheral vascular catheterization Bilateral 12/06/2015    Procedure:  Upper Extremity Venography/ central venogram;  Surgeon: Conrad Bergen, MD;  Location: [place] CV LAB;  Service: Cardiovascular;  Laterality: Bilateral;  . Vascular access device insertion Right 01/01/2016    Procedure: INSERTION OF HERO VASCULAR ACCESS DEVICE - RIGHT;  Surgeon: Conrad , MD;  Location: [place];  Service: Vascular;  Laterality: Right;  . Removal of a hero device Right 01/19/2016    Procedure: REMOVAL OF A HERO DEVICE;  Surgeon: Elam Dutch, MD;  Location: [place];  Service: Vascular;  Laterality: Right;  . Insertion of dialysis catheter Right 01/19/2016    Procedure: INSERTION OF Right internal jugular   DIALYSIS CATHETER.;  Surgeon: Elam Dutch, MD;  Location: [place];  Service: Vascular;  Laterality: Right;    There were no vitals filed for this visit.      Subjective Assessment - 04/17/16 1252    Currently in Pain? Yes   Pain Score 2    Pain Location Hand  palm   Pain Orientation Right   Pain Descriptors / Indicators Burning   Pain Type Acute pain   Pain Radiating Towards N/A   Pain Onset 1 to 4 weeks ago   Pain Frequency Intermittent   Aggravating Factors  Use   Pain Relieving Factors Nothing   Effect of Pain on Daily Activities limited ability to use right hand during daily tasks   Multiple Pain Sites No            [facility] OT Assessment - 04/17/16 1042    Assessment   Diagnosis Ischemic injury to right hand   Precautions   Precautions None                  OT Treatments/Exercises (OP) - 04/17/16 1036    Exercises   Exercises Hand;Wrist   Additional Wrist Exercises   Sponges 20,26   Hand Gripper with Large Beads With gripper set at 38# patient was able to pick up 5 beads. Remaining beds picked up with gripper set at 29#   Hand Gripper with Medium Beads Pt picked up 1 bead with gripper set at 38#. Remaining beads picked up with 29#   Hand Gripper with Small Beads all beads with gripper set at 29#   Hand Exercises   MCPJ Flexion AROM;10 reps   MCPJ Extension AROM;10 reps   PIPJ Flexion AROM;10 reps   PIPJ Extension AROM;10 reps   DIPJ Flexion AROM;10 reps   DIPJ Extension AROM;10 reps   Digit Composite ABduction AROM;10 reps   Digit Composite ADduction AROM;10 reps   Other Hand Exercises Pt utilized red clothespin using 3 point pinch to pick up 30 high resistance sponges with mod-max difficulty. 3-4 rest breaks used as needed.                   OT Short Term Goals - 04/17/16 1315    OT SHORT TERM GOAL #1   Title Patient will be educated and independent with HEP to increase functional use of RUE during daily tasks.    Time 3   Period  Weeks   OT SHORT TERM GOAL #2   Title Patient will increase A/ROM of right hand to 100% to increase ability to grasp household items fully as needed.    Time 3   Period Weeks   OT SHORT TERM GOAL #3   Title Patient will increase grip strength by 5# and pinch strength by 3# to increase ability to do buttons when getting  dressed.    Time 3   Period Weeks   OT SHORT TERM GOAL #4   Title Patient will utilize desensitization techniques to decrease hypersensitivity in right hand in order to allow him to participate in more daily activities using hand for 50% or more of the tasks.    Time 3   Period Weeks           OT Long Term Goals - 04/17/16 1315    OT LONG TERM GOAL #1   Title Patient will return to highest level of independence using RUE for 75% or more of daily tasks.    Time 6   Period Weeks   Status On-going   OT LONG TERM GOAL #2   Title Patient will increase grip strength by 10# and pinch strength by 5# to increase ability to open food packets.    Time 6   Period Weeks   Status Partially Met   OT LONG TERM GOAL #3   Title Patient will increase coordination by completing the 9 hole peg test in 48 seconds or less with Right hand.    Time 6   Period Weeks               Plan - 04/17/16 1311    Clinical Impression Statement A: Pt had increased difficulty with gripper task requiring increased rest breaks. Pt reports a burning sensation in the palm of his right hand.    OT Frequency 1x / week   OT Duration --  1 more week   Plan P: Update HEP as needed and discharge from therapy.      Patient will benefit from skilled therapeutic intervention in order to improve the following deficits and impairments:  Decreased strength, Impaired sensation, Impaired UE functional use, Decreased range of motion, Decreased coordination  Visit Diagnosis: Other lack of coordination - Plan: Ot plan of care cert/re-cert  Other symptoms and signs involving the musculoskeletal system -  Plan: Ot plan of care cert/re-cert    Problem List Patient Active Problem List   Diagnosis Date Noted  . Steal syndrome of dialysis vascular access ([place]) 01/18/2016  . Infection 10/13/2015  . Infection of arteriovenous graft for hemodialysis ([place]) 10/12/2015  . ESRD on dialysis ([place]) 06/15/2015  . Diabetic neuropathy associated with type 2 diabetes mellitus ([place]) 05/25/2015  . Infection, dialysis vascular access ([place]) 04/19/2015  . Rectal bleeding 02/06/2015  . SOB (shortness of breath) 03/24/2014  . Abnormal EKG 03/24/2014  . Essential hypertension, benign 03/15/2014  . Mixed hyperlipidemia 03/15/2014  . H/O adenomatous polyp of colon 11/07/2013  . Constipation 06/15/2013  . Encounter for screening colonoscopy 11/02/2012  . Fecal incontinence 09/10/2012  . GERD (gastroesophageal reflux disease) 09/10/2012  . Diabetes mellitus 08/06/2011  . CRF (chronic renal failure) 08/06/2011    Ailene Ravel, OTR/L,CBIS  [phone]  04/17/2016, 1:15 PM  [facility] [address] [place], [name], [address] Phone: [phone]   Fax:  [phone]  Name: IZREAL KOCK MRN: [id number] Date of Birth: 1941/09/27

## 2016-04-18 ENCOUNTER — Encounter (HOSPITAL_COMMUNITY): Payer: Medicare Other

## 2016-04-22 ENCOUNTER — Ambulatory Visit (HOSPITAL_COMMUNITY): Payer: Medicare Other

## 2016-04-22 DIAGNOSIS — R278 Other lack of coordination: Secondary | ICD-10-CM

## 2016-04-22 DIAGNOSIS — R29898 Other symptoms and signs involving the musculoskeletal system: Secondary | ICD-10-CM

## 2016-04-22 NOTE — Therapy (Signed)
[facility] [place], [name], [address] Phone: [phone]   Fax:  [phone]  Occupational Therapy Treatment  Patient Details  Name: Gerald Davenport MRN: [id number] Date of Birth: 12-Feb-1941 Referring Provider: Erling Cruz, MD  Encounter Date: 04/22/2016      OT End of Session - 04/22/16 1049    Visit Number 14   Number of Visits 14   Date for OT Re-Evaluation 01/22/16   Authorization Type [employer] Med[employer] advantage PPO   Authorization Time Period before 20th visit   Authorization - Visit Number 13   Authorization - Number of Visits 20   OT Start Time [phone]   OT Stop Time 1030   OT Time Calculation (min) 40 min   Activity Tolerance Patient tolerated treatment well   Behavior During Therapy [facility] for tasks assessed/performed      Past Medical History:  Diagnosis Date  . Anemia   . Anxiety   . Cancer [facility])    Prostate cancer treated with radiation therapy  . Chronic kidney disease    dialysis Mon, Wednes, Fri  . Clot 05/2006   Arterial clot right leg   . Constipation   . Diabetes mellitus    Type II  . Diverticulosis   . GERD (gastroesophageal reflux disease)   . Hemodialysis patient ([place]) 6/13  . Hyperlipidemia   . Hypertension   . Infection    drainage from distal incision of left upper arm arteriovenous graft  . Positive PPD   . Shortness of breath dyspnea    with exertion, when he has too much fluid  . Steal syndrome dialysis vascular access ([place])   . Thyroid disease    hyperparathyroidism  . Wears glasses     Past Surgical History:  Procedure Laterality Date  . AV FISTULA PLACEMENT  11/04/2010   left and left  . AV FISTULA PLACEMENT  11/20/2011   Procedure: INSERTION OF ARTERIOVENOUS (AV) GORE-TEX GRAFT ARM;  Surgeon: Angelia Mould, MD;  Location: [place];  Service: Vascular;  Laterality: Right;  . AV FISTULA PLACEMENT Right 04/19/2015   Procedure: Excision of infected RUA AVG;  Surgeon: Conrad Markle, MD;  Location: [place];  Service: Vascular;  Laterality: Right;  . AV FISTULA PLACEMENT Left 10/04/2015   Procedure: INSERTION OF ARTERIOVENOUS (AV) GORE-TEX GRAFT ARM;  Surgeon: Conrad [place], MD;  Location: [place];  Service: Vascular;  Laterality: Left;  . [place] REMOVAL Left 10/13/2015   Procedure: REMOVAL OF INFECTED ARTERIOVENOUS GORETEX LEFT GRAFT ;  Surgeon: Conrad [place], MD;  Location: [place];  Service: Vascular;  Laterality: Left;  . BASCILIC VEIN TRANSPOSITION Left 06/19/2015   Procedure: FIRST STAGE BRACHIAL VEIN TRANSPOSITION;  Surgeon: Conrad , MD;  Location: [place];  Service: Vascular;  Laterality: Left;  . 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 COLONOSCOPY N/A 11/10/2012   YHC:[id number] proctitis/Pancolonic diverticulosis/(TUBULAR ADENOMA)5 mm polyp in the transverse segment/4 mm polyps in the base of the cecum and diminutive polyp in the descending segment. INADEQUATE BOWEL PREP  . COLONOSCOPY N/A 11/28/2013   RMR: Radiation proctitis. Colonic diverticulosis. Colonic polyps-removed as described above.   . INSERTION OF DIALYSIS CATHETER Right 01/19/2016   Procedure: INSERTION OF Right internal jugular  DIALYSIS CATHETER.;  Surgeon: Elam Dutch, MD;  Location: [place];  Service: Vascular;  Laterality: Right;  . PERIPHERAL VASCULAR CATHETERIZATION Bilateral 12/06/2015   Procedure: Upper Extremity Venography/ central venogram;  Surgeon:  Conrad [place], MD;  Location: [place] CV LAB;  Service: Cardiovascular;  Laterality: Bilateral;  . REMOVAL OF A HERO DEVICE Right 01/19/2016   Procedure: REMOVAL OF A HERO DEVICE;  Surgeon: Elam Dutch, MD;  Location: [place];  Service: Vascular;  Laterality: Right;  . SHUNTOGRAM N/A 10/08/2012   Procedure: [name];  Surgeon: Elam Dutch, MD;  Location: [place] CATH LAB;  Service: Cardiovascular;  Laterality: N/A;  . VASCULAR ACCESS DEVICE INSERTION Right 01/01/2016   Procedure: INSERTION OF HERO VASCULAR ACCESS DEVICE - RIGHT;  Surgeon:  Conrad [place], MD;  Location: [place];  Service: Vascular;  Laterality: Right;    There were no vitals filed for this visit.      Subjective Assessment - 04/22/16 0951    Subjective  S: My hand is still burning.    Currently in Pain? Yes   Pain Score 8    Pain Location Hand  palm   Pain Orientation Right   Pain Descriptors / Indicators Burning   Pain Type Acute pain                      OT Treatments/Exercises (OP) - 04/22/16 8185      Exercises   Exercises Hand;Wrist     Additional Wrist Exercises   Hand Gripper with Large Beads all beads with gripper set at 35#   Hand Gripper with Medium Beads all beads with gripper set at 35#   Hand Gripper with Small Beads 5 beads with gripper set at 29#.             04/22/16 1042  Assessment  Diagnosis Ischemic injury to right hand  Precautions  Precautions None  Coordination  9 Hole Peg Test Right  Right 9 Hole Peg Test 46.3" (previous: 1'05")  AROM  Right Composite Finger Flexion (100% On eval: 75%)  Right Thumb Opposition Digit 5 (previous: digit 4)  Strength  Right Hand Grip (lbs) 50 (previous: 35)       OT Education - 04/22/16 1102    Education provided Yes   Education Details Fine motor coordination exercises added to HEP that has already been in place. Discussed frequency.   Person(s) Educated Patient   Methods Explanation;Demonstration;Handout;Verbal cues   Comprehension Verbalized understanding;Returned demonstration          OT Short Term Goals - 04/17/16 1315      OT SHORT TERM GOAL #1   Title Patient will be educated and independent with HEP to increase functional use of RUE during daily tasks.    Time 3   Period Weeks     OT SHORT TERM GOAL #2   Title Patient will increase A/ROM of right hand to 100% to increase ability to grasp household items fully as needed.    Time 3   Period Weeks     OT SHORT TERM GOAL #3   Title Patient will increase grip strength by 5# and pinch  strength by 3# to increase ability to do buttons when getting dressed.    Time 3   Period Weeks     OT SHORT TERM GOAL #4   Title Patient will utilize desensitization techniques to decrease hypersensitivity in right hand in order to allow him to participate in more daily activities using hand for 50% or more of the tasks.    Time 3   Period Weeks           OT Long Term Goals - 04/22/16  [place] #1   Title Patient will return to highest level of independence using RUE for 75% or more of daily tasks.    Time 6   Period Weeks   Status Achieved     OT LONG TERM GOAL #2   Title Patient will increase grip strength by 10# and pinch strength by 5# to increase ability to open food packets.    Time 6   Period Weeks   Status Partially Met     OT LONG TERM GOAL #3   Title Patient will increase coordination by completing the 9 hole peg test in 48 seconds or less with Right hand.    Time 6   Period Weeks               Plan - 04/28/16 1049    Clinical Impression Statement A: Pt completed grip strengthening activity this session and performed moderately well. Pt continues to report increased burning sensation in palm of right hand. HEP was reviewed and therapist recommended additional exercises and activities to increase coordination. patient in agreement with D/C.    Plan P: discharge from therapy with HEP. Follow up with Dr. Florene Glen.       Patient will benefit from skilled therapeutic intervention in order to improve the following deficits and impairments:  Decreased strength, Impaired sensation, Impaired UE functional use, Decreased range of motion, Decreased coordination  Visit Diagnosis: Other lack of coordination  Other symptoms and signs involving the musculoskeletal system      G-Codes - 04-28-2016 1052    Functional Assessment Tool Used FOTO score: 44/100 (56% impaired)   Functional Limitation Carrying, moving and handling objects   Carrying,  Moving and Handling Objects Goal Status (A1937) At least 20 percent but less than 40 percent impaired, limited or restricted   Carrying, Moving and Handling Objects Discharge Status [phone]) At least 40 percent but less than 60 percent impaired, limited or restricted      Problem List Patient Active Problem List   Diagnosis Date Noted  . Steal syndrome of dialysis vascular access ([place]) 01/18/2016  . Infection 10/13/2015  . Infection of arteriovenous graft for hemodialysis ([place]) 10/12/2015  . ESRD on dialysis ([place]) 06/15/2015  . Diabetic neuropathy associated with type 2 diabetes mellitus ([place]) 05/25/2015  . Infection, dialysis vascular access ([place]) 04/19/2015  . Rectal bleeding 02/06/2015  . SOB (shortness of breath) 03/24/2014  . Abnormal EKG 03/24/2014  . Essential hypertension, benign 03/15/2014  . Mixed hyperlipidemia 03/15/2014  . H/O adenomatous polyp of colon 11/07/2013  . Constipation 06/15/2013  . Encounter for screening colonoscopy 11/02/2012  . Fecal incontinence 09/10/2012  . GERD (gastroesophageal reflux disease) 09/10/2012  . Diabetes mellitus 08/06/2011  . CRF (chronic renal failure) 08/06/2011  OCCUPATIONAL THERAPY DISCHARGE SUMMARY  Visits from Start of Care: 14  Current functional level related to goals / functional outcomes: See goals above   Remaining deficits: Pt reports increased burning sensation in right palm of hand.   Education / Equipment: See above Plan: Patient agrees to discharge.  Patient goals were met. Patient is being discharged due to meeting the stated rehab goals.  ?????        Ailene Ravel, OTR/L,CBIS  [phone]  04/28/16, 11:03 AM  [place], [name], [address] Phone: [phone]   Fax:  [phone]  Name: Gerald Davenport MRN: [id number] Date of Birth: 17-Mar-1941

## 2016-05-20 ENCOUNTER — Encounter: Payer: Self-pay | Admitting: Vascular Surgery

## 2016-05-20 NOTE — Progress Notes (Deleted)
Established Dialysis Access  History of Present Illness  Gerald Davenport is a 75 y.o. (1940-11-30) male who presents for re-evaluation for permanent access.  The patient is right hand dominant.  Previous access procedures have been completed in the both arms.  The patient's complication from previous access procedures include: thrombosis and steal.  The patient has *** had a previous PPM placed.  The patient's PMH, PSH, SH, and FamHx are unchanged from 02/14/16.  Current Outpatient Prescriptions  Medication Sig Dispense Refill  . acetaminophen-codeine (TYLENOL #3) 300-30 MG tablet Take 1 tablet by mouth every 4 (four) hours as needed for moderate pain. (Patient not taking: Reported on 02/26/2016) 10 tablet 0  . aspirin EC 81 MG tablet Take 81-162 mg by mouth 2 (two) times daily. Take 2 tablets (162 mg) by mouth every morning and 1 tablet (81 mg) at night    . buPROPion (WELLBUTRIN XL) 150 MG 24 hr tablet Take 150 mg by mouth daily.    . calcium acetate (PHOSLO) 667 MG capsule Take 667-1,334 mg by mouth See admin instructions. Take 2 capsules (1334 mg) by mouth with breakfast and with supper, take 1 capsule (667 mg) with snacks or small meals    . cloNIDine (CATAPRES) 0.2 MG tablet Take 1 tablet (0.2 mg total) by mouth 3 (three) times daily. 42 tablet 0  . doxazosin (CARDURA) 8 MG tablet Take 8 mg by mouth daily. Reported on 02/26/2016    . furosemide (LASIX) 40 MG tablet Take 40 mg by mouth daily.    Marland Kitchen gabapentin (NEURONTIN) 100 MG capsule Take 100 mg by mouth 2 (two) times daily.    . insulin detemir (LEVEMIR) 100 UNIT/ML injection Inject 25 Units into the skin at bedtime.     . Linaclotide (LINZESS) 145 MCG CAPS capsule Take 1 capsule (145 mcg total) by mouth daily. Take 30 minutes before breakfast (Patient taking differently: Take 145 mcg by mouth See admin instructions. Take 30 minutes before breakfast on Sunday, Tuesday, Thursday, Saturday (non-dialysis days)) 30 capsule 11  . omeprazole  (PRILOSEC) 20 MG capsule Take 20 mg by mouth 2 (two) times daily before a meal.     . oxyCODONE-acetaminophen (PERCOCET/ROXICET) 5-325 MG tablet Take 1 tablet by mouth every 6 (six) hours as needed. 20 tablet 0  . Polyvinyl Alcohol-Povidone (REFRESH OP) Place 1 drop into both eyes at bedtime.    Marland Kitchen QUEtiapine (SEROQUEL) 100 MG tablet Take 100 mg by mouth at bedtime.    . saxagliptin HCl (ONGLYZA) 5 MG TABS tablet Take 5 mg by mouth daily with breakfast.     . SENSIPAR 30 MG tablet Take 1 tablet by mouth daily.    . simvastatin (ZOCOR) 20 MG tablet Take 20 mg by mouth daily.    . Vancomycin (VANCOCIN) 750 MG/150ML SOLN Inject 150 mLs (750 mg total) into the vein every Monday, Wednesday, and Friday with hemodialysis. 450 mL 0   No current facility-administered medications for this visit.     No Known Allergies  On ROS today: ***, ***   Physical Examination  There were no vitals filed for this visit. There is no height or weight on file to calculate BMI.  General: A&O x 3, WD, *** ,Cachectic / Ill appear / Somulent  Pulmonary: Sym exp, good air movt, CTAB, no rales, rhonchi, & wheezing***, + rales / + rhonchi / + wheezing  Cardiac: RRR, Nl S1, S2, no Murmurs, rubs or gallops*** ,S3/S4, Irregularly, irregular rhythm and rate  Vascular: Vessel Right Left  Radial ***Palpable ***Palpable  Ulnar ***Palpable ***Palpable  Brachial ***Palpable ***Palpable   Gastrointestinal: soft, NTND, no G/R, bo HSM, no masses, no CVAT B***, + AAA / Surg[facility] Inc TTP: (RUQ / RLQ / LUQ / LLQ / Epigastric), +G / +R  Musculoskeletal: M/S 5/5 throughout *** except ***, Extremities without  ischemic changes *** except  ***, *** palpable thrill in access in ***, *** bruit in access  Neurologic: Pain and light touch intact in extremities *** except ***, Motor exam as listed above   Medical Decision Making  Gerald Davenport is a 75 y.o. male who presents with ESRD requiring hemodialysis.    Based on vein mapping  and examination, this patient's permanent access options include: possible thigh AVG.  Would obtain aortoiliac duplex to determine the quality of inflow to both legs.  If the iliac arterial flow is adequate, might consider thigh AVG.  I had an extensive discussion with this patient in regards to the nature of access surgery, including risk, benefits, and alternatives.    The patient is aware that the risks of access surgery include but are not limited to: bleeding, infection, steal syndrome, nerve damage, ischemic monomelic neuropathy, failure of access to mature, and possible need for additional access procedures in the future.  The patient has *** agreed to proceed with the above procedure which will be scheduled ***.   Adele Barthel, MD, FACS [facility] Office: [phone] Pager: [phone]

## 2016-05-23 ENCOUNTER — Ambulatory Visit (INDEPENDENT_AMBULATORY_CARE_PROVIDER_SITE_OTHER): Payer: Medicare Other | Admitting: Vascular Surgery

## 2016-05-23 ENCOUNTER — Encounter: Payer: Self-pay | Admitting: Vascular Surgery

## 2016-05-23 ENCOUNTER — Ambulatory Visit: Payer: Medicare Other | Admitting: Vascular Surgery

## 2016-05-23 ENCOUNTER — Other Ambulatory Visit: Payer: Self-pay

## 2016-05-23 VITALS — BP 120/68 | HR 91 | Temp 98.9°F | Resp 20 | Ht 69.0 in | Wt 154.0 lb

## 2016-05-23 DIAGNOSIS — Z992 Dependence on renal dialysis: Secondary | ICD-10-CM | POA: Diagnosis not present

## 2016-05-23 DIAGNOSIS — T82898S Other specified complication of vascular prosthetic devices, implants and grafts, sequela: Secondary | ICD-10-CM | POA: Diagnosis not present

## 2016-05-23 DIAGNOSIS — N186 End stage renal disease: Secondary | ICD-10-CM | POA: Diagnosis not present

## 2016-05-23 DIAGNOSIS — I70212 Atherosclerosis of native arteries of extremities with intermittent claudication, left leg: Secondary | ICD-10-CM | POA: Diagnosis not present

## 2016-05-23 DIAGNOSIS — I70229 Atherosclerosis of native arteries of extremities with rest pain, unspecified extremity: Secondary | ICD-10-CM | POA: Insufficient documentation

## 2016-05-23 NOTE — Progress Notes (Signed)
History of Present Illness:    Gerald Davenport is a 75 y.o. (1940-10-25) male  who underwent HeRO (Date: [id number]) complicated by steal syndrome leading to Removal of HeRO, RIJV TDC placement.  He still has complaints of right hand pain and decreased use.  His newest problem includes left foot pain.  He denies ulcers on the left LE.  He has had a previous right BKA.  He has recent ABI study ordered by Dr. Gwenlyn Found.  Pt denies any rest pain.   Past Medical History:  Diagnosis Date  . Anemia   . Anxiety   . Cancer [facility])    Prostate cancer treated with radiation therapy  . Chronic kidney disease    dialysis Mon, Wednes, Fri  . Clot 05/2006   Arterial clot right leg   . Constipation   . Diabetes mellitus    Type II  . Diverticulosis   . GERD (gastroesophageal reflux disease)   . Hemodialysis patient ([place]) 6/13  . Hyperlipidemia   . Hypertension   . Infection    drainage from distal incision of left upper arm arteriovenous graft  . Positive PPD   . Shortness of breath dyspnea    with exertion, when he has too much fluid  . Steal syndrome dialysis vascular access ([place])   . Thyroid disease    hyperparathyroidism  . Wears glasses     Past Surgical History:  Procedure Laterality Date  . AV FISTULA PLACEMENT  11/04/2010   left and left  . AV FISTULA PLACEMENT  11/20/2011   Procedure: INSERTION OF ARTERIOVENOUS (AV) GORE-TEX GRAFT ARM;  Surgeon: Angelia Mould, MD;  Location: [place];  Service: Vascular;  Laterality: Right;  . AV FISTULA PLACEMENT Right 04/19/2015   Procedure: Excision of infected RUA AVG;  Surgeon: Conrad [place], MD;  Location: [place];  Service: Vascular;  Laterality: Right;  . AV FISTULA PLACEMENT Left 10/04/2015   Procedure: INSERTION OF ARTERIOVENOUS (AV) GORE-TEX GRAFT ARM;  Surgeon: Conrad Oblong, MD;  Location: [place];  Service: Vascular;  Laterality: Left;  . [place] REMOVAL Left 10/13/2015   Procedure: REMOVAL OF INFECTED ARTERIOVENOUS GORETEX LEFT GRAFT ;  Surgeon:  Conrad Spearman, MD;  Location: [place];  Service: Vascular;  Laterality: Left;  . BASCILIC VEIN TRANSPOSITION Left 06/19/2015   Procedure: FIRST STAGE BRACHIAL VEIN TRANSPOSITION;  Surgeon: Conrad Stamford, MD;  Location: [place];  Service: Vascular;  Laterality: Left;  . 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 COLONOSCOPY N/A 11/10/2012   [id number] proctitis/Pancolonic diverticulosis/(TUBULAR ADENOMA)5 mm polyp in the transverse segment/4 mm polyps in the base of the cecum and diminutive polyp in the descending segment. INADEQUATE BOWEL PREP  . COLONOSCOPY N/A 11/28/2013   RMR: Radiation proctitis. Colonic diverticulosis. Colonic polyps-removed as described above.   . INSERTION OF DIALYSIS CATHETER Right 01/19/2016   Procedure: INSERTION OF Right internal jugular  DIALYSIS CATHETER.;  Surgeon: Elam Dutch, MD;  Location: [place];  Service: Vascular;  Laterality: Right;  . PERIPHERAL VASCULAR CATHETERIZATION Bilateral 12/06/2015   Procedure: Upper Extremity Venography/ central venogram;  Surgeon: Conrad Sisson[place], MD;  Location: [place] CV LAB;  Service: Cardiovascular;  Laterality: Bilateral;  . REMOVAL OF A HERO DEVICE Right 01/19/2016   Procedure: REMOVAL OF A HERO DEVICE;  Surgeon: Elam Dutch, MD;  Location: [place];  Service: Vascular;  Laterality: Right;  . SHUNTOGRAM N/A 10/08/2012   Procedure: [name];  Surgeon: Juanda Crumble  Antony Blackbird, MD;  Location: [place] CATH LAB;  Service: Cardiovascular;  Laterality: N/A;  . VASCULAR ACCESS DEVICE INSERTION Right 01/01/2016   Procedure: INSERTION OF HERO VASCULAR ACCESS DEVICE - RIGHT;  Surgeon: Conrad Worthington, MD;  Location: [place];  Service: Vascular;  Laterality: Right;     Social History Social History  Substance Use Topics  . Smoking status: Light Tobacco Smoker    Packs/day: 0.25    Years: 50.00    Types: Cigarettes  . Smokeless tobacco: Never Used     Comment: 4-5 cigarettes daily  . Alcohol use No    Family History Family History    Problem Relation Age of Onset  . Heart disease Mother   . Diabetes Mother   . Hypertension Mother   . Stroke Mother   . Heart disease Father   . Diabetes Father   . Heart disease Sister   . Diabetes Brother   . Colon cancer Neg Hx     Allergies  No Known Allergies   Current Outpatient Prescriptions  Medication Sig Dispense Refill  . acetaminophen-codeine (TYLENOL #3) 300-30 MG tablet Take 1 tablet by mouth every 4 (four) hours as needed for moderate pain. 10 tablet 0  . aspirin EC 81 MG tablet Take 81-162 mg by mouth 2 (two) times daily. Take 2 tablets (162 mg) by mouth every morning and 1 tablet (81 mg) at night    . buPROPion (WELLBUTRIN XL) 150 MG 24 hr tablet Take 150 mg by mouth daily.    . calcium acetate (PHOSLO) 667 MG capsule Take 667-1,334 mg by mouth See admin instructions. Take 2 capsules (1334 mg) by mouth with breakfast and with supper, take 1 capsule (667 mg) with snacks or small meals    . cloNIDine (CATAPRES) 0.2 MG tablet Take 1 tablet (0.2 mg total) by mouth 3 (three) times daily. 42 tablet 0  . doxazosin (CARDURA) 8 MG tablet Take 8 mg by mouth daily. Reported on 02/26/2016    . furosemide (LASIX) 40 MG tablet Take 40 mg by mouth daily.    Marland Kitchen gabapentin (NEURONTIN) 100 MG capsule Take 100 mg by mouth 2 (two) times daily.    . insulin detemir (LEVEMIR) 100 UNIT/ML injection Inject 25 Units into the skin at bedtime.     Marland Kitchen omeprazole (PRILOSEC) 20 MG capsule Take 20 mg by mouth 2 (two) times daily before a meal.     . oxyCODONE-acetaminophen (PERCOCET/ROXICET) 5-325 MG tablet Take 1 tablet by mouth every 6 (six) hours as needed. 20 tablet 0  . Polyvinyl Alcohol-Povidone (REFRESH OP) Place 1 drop into both eyes at bedtime.    . psyllium (METAMUCIL) 58.6 % powder Take 1 packet by mouth 3 (three) times daily.    . QUEtiapine (SEROQUEL) 100 MG tablet Take 100 mg by mouth at bedtime.    . saxagliptin HCl (ONGLYZA) 5 MG TABS tablet Take 5 mg by mouth daily with breakfast.      . simvastatin (ZOCOR) 20 MG tablet Take 20 mg by mouth daily.    . Vancomycin (VANCOCIN) 750 MG/150ML SOLN Inject 150 mLs (750 mg total) into the vein every Monday, Wednesday, and Friday with hemodialysis. 450 mL 0  . Linaclotide (LINZESS) 145 MCG CAPS capsule Take 1 capsule (145 mcg total) by mouth daily. Take 30 minutes before breakfast (Patient not taking: Reported on 05/23/2016) 30 capsule 11  . SENSIPAR 30 MG tablet Take 1 tablet by mouth daily.     No current facility-administered medications  for this visit.     ROS:   General:  No weight loss, Fever, chills  HEENT: No recent headaches, no nasal bleeding, no visual changes, no sore throat  Neurologic: No dizziness, blackouts, seizures. No recent symptoms of stroke or mini- stroke. No recent episodes of slurred speech, or temporary blindness.  Cardiac: No recent episodes of chest pain/pressure, no shortness of breath at rest.  No shortness of breath with exertion.  Denies history of atrial fibrillation or irregular heartbeat  Vascular: No history of rest pain in feet.  No history of claudication.  No history of non-healing ulcer, No history of DVT   Pulmonary: No home oxygen, no productive cough, no hemoptysis,  No asthma or wheezing  Musculoskeletal:  [ ]  Arthritis, [ ]  Low back pain,  [ ]  Joint pain [x]  muscle waisting with palmer pain right hand [x]  right BKA  Hematologic:No history of hypercoagulable state.  No history of easy bleeding.  No history of anemia  Gastrointestinal: No hematochezia or melena,  No gastroesophageal reflux, no trouble swallowing  Urinary: [x ] chronic Kidney disease, [ ]  on HD - [x ] MWF or [ ]  TTHS, [ ]  Burning with urination, [ ]  Frequent urination, [ ]  Difficulty urinating;   Skin: No rashes  Psychological: No history of anxiety,  No history of depression   Physical Examination  Vitals:   05/23/16 1426  BP: 120/68  Pulse: 91  Resp: 20  Temp: 98.9 F (37.2 C)  TempSrc: Oral  SpO2:  98%  Weight: 154 lb (69.9 kg)  Height: 5\' 9"  (1.753 m)    Body mass index is 22.74 kg/m.  General:  Alert and oriented, no acute distress HEENT: Normal Neck: No bruit or JVD Pulmonary: Clear to auscultation bilaterally Cardiac: Regular Rate and Rhythm without murmur Gastrointestinal: Soft, non-tender, non-distended Skin: No rash Extremity Pulses:  palpable left radial monophasic doppler right, brachial pulses bilaterally Musculoskeletal: No edema , Thenar Muscle Atrophy  Right hand > left Neurologic: Upper and lower extremity motor 5/5 and symmetric  DATA:  Dr. Gwenlyn Found ordered ABI and arterial duplex on 11/06/2015 Left LE  0.76 and TBI 0.45 on the left LE with monophasic wave forms.  This was reviewed with Dr. Bridgett Larsson today.    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  Since he is not showing improvement in his right UE symptoms we will plan on right UE angiogram to better delineate his UE arterial insufficieny.    His ABI's show mild creased arterial flow with monophasic wave forms on the arterial duplex.   At this point he does not have skin breakdown, but complaints of left LE pain.  He has had a right BKA due to sever PAD.  We will perform a left LE angiogram with run off and possible intervention at the same time as the right UE angiogram.  Theda Sers, [place] [facility] PA-C [facility]  The patient was seen in conjunction with Dr. Bridgett Larsson today  Addendum  I have independently interviewed and examined the patient, and I agree with the physician assistant's findings.  Pt has significant sx in R arm with obvious wasting in R>L hands.  Would recommend: RUE angiogram to see if any optimization in flow can be accomplished.  Would proceed also with L leg angiogram at the same time given progression in sx in  the left leg.   I discussed with the patient  the nature of angiographic procedures, especially the limited patencies of any endovascular intervention.    The patient is aware of that the risks of an angiographic procedure include but are not limited to: bleeding, infection, access site complications, renal failure, embolization, rupture of vessel, dissection, arteriovenous fistula, possible need for emergent surgical intervention, possible need for surgical procedures to treat the patient's pathology, anaphylactic reaction to contrast, and stroke and death.    The patient is aware of the risks and agrees to proceed.  The patient is being scheduled for this coming Thursday 31 AUG 17.   Adele Barthel, MD [facility] [place] Office: [phone] Pager: [phone]  05/23/2016, 3:43 PM

## 2016-05-29 ENCOUNTER — Encounter (HOSPITAL_COMMUNITY)
Admission: RE | Disposition: A | Discharge status: Home / Self Care | Payer: Self-pay | Source: Ambulatory Visit | Attending: Vascular Surgery

## 2016-05-29 ENCOUNTER — Ambulatory Visit (HOSPITAL_COMMUNITY)
Admission: RE | Admit: 2016-05-29 | Discharge: 2016-05-29 | Disposition: A | Discharge status: Home / Self Care | Payer: Medicare Other | Source: Ambulatory Visit | Attending: Vascular Surgery | Admitting: Vascular Surgery

## 2016-05-29 DIAGNOSIS — Z992 Dependence on renal dialysis: Secondary | ICD-10-CM | POA: Diagnosis not present

## 2016-05-29 DIAGNOSIS — Z7982 Long term (current) use of aspirin: Secondary | ICD-10-CM | POA: Diagnosis not present

## 2016-05-29 DIAGNOSIS — I70229 Atherosclerosis of native arteries of extremities with rest pain, unspecified extremity: Secondary | ICD-10-CM | POA: Diagnosis present

## 2016-05-29 DIAGNOSIS — E1122 Type 2 diabetes mellitus with diabetic chronic kidney disease: Secondary | ICD-10-CM | POA: Diagnosis not present

## 2016-05-29 DIAGNOSIS — E785 Hyperlipidemia, unspecified: Secondary | ICD-10-CM | POA: Insufficient documentation

## 2016-05-29 DIAGNOSIS — K219 Gastro-esophageal reflux disease without esophagitis: Secondary | ICD-10-CM | POA: Diagnosis not present

## 2016-05-29 DIAGNOSIS — I70212 Atherosclerosis of native arteries of extremities with intermittent claudication, left leg: Secondary | ICD-10-CM | POA: Insufficient documentation

## 2016-05-29 DIAGNOSIS — Z794 Long term (current) use of insulin: Secondary | ICD-10-CM | POA: Diagnosis not present

## 2016-05-29 DIAGNOSIS — E079 Disorder of thyroid, unspecified: Secondary | ICD-10-CM | POA: Diagnosis not present

## 2016-05-29 DIAGNOSIS — T82898A Other specified complication of vascular prosthetic devices, implants and grafts, initial encounter: Secondary | ICD-10-CM | POA: Diagnosis not present

## 2016-05-29 DIAGNOSIS — G458 Other transient cerebral ischemic attacks and related syndromes: Secondary | ICD-10-CM | POA: Insufficient documentation

## 2016-05-29 DIAGNOSIS — F17210 Nicotine dependence, cigarettes, uncomplicated: Secondary | ICD-10-CM | POA: Diagnosis not present

## 2016-05-29 DIAGNOSIS — I120 Hypertensive chronic kidney disease with stage 5 chronic kidney disease or end stage renal disease: Secondary | ICD-10-CM | POA: Insufficient documentation

## 2016-05-29 DIAGNOSIS — N186 End stage renal disease: Secondary | ICD-10-CM

## 2016-05-29 DIAGNOSIS — E213 Hyperparathyroidism, unspecified: Secondary | ICD-10-CM | POA: Diagnosis not present

## 2016-05-29 DIAGNOSIS — Z89511 Acquired absence of right leg below knee: Secondary | ICD-10-CM | POA: Insufficient documentation

## 2016-05-29 DIAGNOSIS — Z8546 Personal history of malignant neoplasm of prostate: Secondary | ICD-10-CM | POA: Diagnosis not present

## 2016-05-29 HISTORY — PX: PERIPHERAL VASCULAR CATHETERIZATION: SHX172C

## 2016-05-29 LAB — POCT I-STAT, CHEM 8
BUN: 27 mg/dL — AB (ref 6–20)
CALCIUM ION: 1.14 mmol/L — AB (ref 1.15–1.40)
CHLORIDE: 99 mmol/L — AB (ref 101–111)
Creatinine, Ser: 9.1 mg/dL — ABNORMAL HIGH (ref 0.61–1.24)
Glucose, Bld: 148 mg/dL — ABNORMAL HIGH (ref 65–99)
HEMATOCRIT: 41 % (ref 39.0–52.0)
Hemoglobin: 13.9 g/dL (ref 13.0–17.0)
POTASSIUM: 4.6 mmol/L (ref 3.5–5.1)
SODIUM: 137 mmol/L (ref 135–145)
TCO2: 31 mmol/L (ref 0–100)

## 2016-05-29 LAB — POCT ACTIVATED CLOTTING TIME
ACTIVATED CLOTTING TIME: 235 s
ACTIVATED CLOTTING TIME: 202 s
ACTIVATED CLOTTING TIME: 175 s

## 2016-05-29 LAB — GLUCOSE, CAPILLARY: GLUCOSE-CAPILLARY: 122 mg/dL — AB (ref 65–99)

## 2016-05-29 SURGERY — UPPER EXTREMITY ANGIOGRAPHY

## 2016-05-29 MED ORDER — LIDOCAINE HCL (PF) 1 % IJ SOLN
INTRAMUSCULAR | Status: AC
  Filled 2016-05-29: qty 30

## 2016-05-29 MED ORDER — HYDRALAZINE HCL 20 MG/ML IJ SOLN
INTRAMUSCULAR | Status: DC | PRN
  Administered 2016-05-29: 11:00:00
  Administered 2016-05-29: 10 mg via INTRAVENOUS

## 2016-05-29 MED ORDER — HEPARIN (PORCINE) IN NACL 2-0.9 UNIT/ML-% IJ SOLN
INTRAMUSCULAR | Status: AC
  Filled 2016-05-29: qty 1000

## 2016-05-29 MED ORDER — SODIUM CHLORIDE 0.9 % IV SOLN
250.0000 mL | INTRAVENOUS | Status: DC | PRN
  Administered 2016-05-29: 250 mL via INTRAVENOUS

## 2016-05-29 MED ORDER — LIDOCAINE HCL (PF) 1 % IJ SOLN
INTRAMUSCULAR | Status: DC | PRN
  Administered 2016-05-29: 20 mL

## 2016-05-29 MED ORDER — SODIUM CHLORIDE 0.9% FLUSH: 3.0000 mL | INTRAVENOUS | Status: DC | PRN

## 2016-05-29 MED ORDER — HYDRALAZINE HCL 20 MG/ML IJ SOLN
INTRAMUSCULAR | Status: AC
  Filled 2016-05-29: qty 1

## 2016-05-29 MED ORDER — OXYCODONE-ACETAMINOPHEN 5-325 MG PO TABS: 1.0000 | ORAL_TABLET | Freq: Four times a day (QID) | ORAL | Status: DC | PRN

## 2016-05-29 MED ORDER — HEPARIN SODIUM (PORCINE) 1000 UNIT/ML IJ SOLN
INTRAMUSCULAR | Status: AC
  Filled 2016-05-29: qty 1

## 2016-05-29 MED ORDER — SODIUM CHLORIDE 0.9% FLUSH: 3.0000 mL | Freq: Two times a day (BID) | INTRAVENOUS | Status: DC

## 2016-05-29 MED ORDER — IODIXANOL 320 MG/ML IV SOLN
INTRAVENOUS | Status: DC | PRN
Start: 2016-05-29 — End: 2016-05-29
  Administered 2016-05-29: 230 mL via INTRAVENOUS

## 2016-05-29 MED ORDER — HEPARIN SODIUM (PORCINE) 1000 UNIT/ML IJ SOLN
INTRAMUSCULAR | Status: DC | PRN
  Administered 2016-05-29: 7000 [IU] via INTRAVENOUS

## 2016-05-29 MED ORDER — HEPARIN (PORCINE) IN NACL 2-0.9 UNIT/ML-% IJ SOLN
INTRAMUSCULAR | Status: DC | PRN
  Administered 2016-05-29: 1000 mL

## 2016-05-29 MED ORDER — MORPHINE SULFATE (PF) 2 MG/ML IV SOLN: 2.0000 mg | INTRAVENOUS | Status: DC | PRN

## 2016-05-29 MED ORDER — HYDRALAZINE HCL 20 MG/ML IJ SOLN: 10.0000 mg | INTRAMUSCULAR | Status: DC | PRN

## 2016-05-29 SURGICAL SUPPLY — 18 items
CATH ANGIO 5F BER2 100CM (CATHETERS) ×2 IMPLANT
CATH ANGIO 5F PIGTAIL 100CM (CATHETERS) ×2 IMPLANT
CATH OMNI FLUSH 5F 65CM (CATHETERS) ×2 IMPLANT
COVER PRB 48X5XTLSCP FOLD TPE (BAG) IMPLANT
COVER PROBE 5X48 (BAG) ×4
DEVICE CONTINUOUS FLUSH (MISCELLANEOUS) ×2 IMPLANT
KIT ENCORE 26 ADVANTAGE (KITS) ×2 IMPLANT
KIT MICROINTRODUCER STIFF 5F (SHEATH) ×2 IMPLANT
KIT PV (KITS) ×4 IMPLANT
SHEATH BRITE TIP 7FR 35CM (SHEATH) ×2 IMPLANT
SHEATH PINNACLE 5F 10CM (SHEATH) ×2 IMPLANT
STENT LIFESTREAM 7X16X80 (Permanent Stent) ×2 IMPLANT
SYR MEDRAD MARK V 150ML (SYRINGE) ×4 IMPLANT
TAPE RADIOPAQUE TURBO (MISCELLANEOUS) ×2 IMPLANT
TRANSDUCER W/STOPCOCK (MISCELLANEOUS) ×4 IMPLANT
TRAY PV CATH (CUSTOM PROCEDURE TRAY) ×4 IMPLANT
WIRE BENTSON .035X145CM (WIRE) ×2 IMPLANT
WIRE ROSEN-J .035X180CM (WIRE) ×2 IMPLANT

## 2016-05-29 NOTE — OR Nursing (Signed)
7 [place] Sheath pulled and manual pressure held for 30 Minutes. No complications sheath site covered with guaze and tegaderm.

## 2016-05-29 NOTE — Discharge Instructions (Signed)
Angiogram, Care After °Refer to this sheet in the next few weeks. These instructions provide you with information about caring for yourself after your procedure. Your health care provider may also give you more specific instructions. Your treatment has been planned according to current medical practices, but problems sometimes occur. Call your health care provider if you have any problems or questions after your procedure. °WHAT TO EXPECT AFTER THE PROCEDURE °After your procedure, it is typical to have the following: °· Bruising at the catheter insertion site that usually fades within 1-2 weeks. °· Blood collecting in the tissue (hematoma) that may be painful to the touch. It should usually decrease in size and tenderness within 1-2 weeks. °HOME CARE INSTRUCTIONS °· Take medicines only as directed by your health care provider. °· You may shower 24-48 hours after the procedure or as directed by your health care provider. Remove the bandage (dressing) and gently wash the site with plain soap and water. Pat the area dry with a clean towel. Do not rub the site, because this may cause bleeding. °· Do not take baths, swim, or use a hot tub until your health care provider approves. °· Check your insertion site every day for redness, swelling, or drainage. °· Do not apply powder or lotion to the site. °· Do not lift over 10 lb (4.5 kg) for 5 days after your procedure or as directed by your health care provider. °· Ask your health care provider when it is okay to: °¨ Return to work or school. °¨ Resume usual physical activities or sports. °¨ Resume sexual activity. °· Do not drive home if you are discharged the same day as the procedure. Have someone else drive you. °· You may drive 24 hours after the procedure unless otherwise instructed by your health care provider. °· Do not operate machinery or power tools for 24 hours after the procedure or as directed by your health care provider. °· If your procedure was done as an  outpatient procedure, which means that you went home the same day as your procedure, a responsible adult should be with you for the first 24 hours after you arrive home. °· Keep all follow-up visits as directed by your health care provider. This is important. °SEEK MEDICAL CARE IF: °· You have a fever. °· You have chills. °· You have increased bleeding from the catheter insertion site. Hold pressure on the site. °SEEK IMMEDIATE MEDICAL CARE IF: °· You have unusual pain at the catheter insertion site. °· You have redness, warmth, or swelling at the catheter insertion site. °· You have drainage (other than a small amount of blood on the dressing) from the catheter insertion site. °· The catheter insertion site is bleeding, and the bleeding does not stop after 30 minutes of holding steady pressure on the site. °· The area near or just beyond the catheter insertion site becomes pale, cool, tingly, or numb. °  °This information is not intended to replace advice given to you by your health care provider. Make sure you discuss any questions you have with your health care provider. °  °Document Released: 04/03/2005 Document Revised: 10/06/2014 Document Reviewed: 02/16/2013 °Elsevier Interactive Patient Education ©2016 [employer] ° °

## 2016-05-29 NOTE — Op Note (Signed)
OPERATIVE NOTE   PROCEDURE: 1.  Right common femoral artery cannulation under ultrasound guidance 2.  Placement of catheter in aorta 3.  Arch aortogram 4.  Third order arterial selection 5.  Right arm angiogram 6.  Abdominal aortogram 7.  Bilateral leg runoff 8.  Right common iliac artery angioplasty and stenting (covered 7 mm x 16 mm)  PRE-OPERATIVE DIAGNOSIS: right hand steal syndrome, left leg intermittent claudication   POST-OPERATIVE DIAGNOSIS: same as above   SURGEON: Adele Barthel, MD  ANESTHESIA: local anesthesia  ESTIMATED BLOOD LOSS: 50 cc  CONTRAST: 240 cc  FINDING(S):  Bovine aortic arch: widely patent  Widely patent innominate, left proximal common carotid artery, and left proximal subclavian artery  Widely patent proximal right common carotid artery  Diffusely diseased subclavian artery with <30% stenoses  Diffusely diseased brachial artery with <50% stenoses  Normal brachial bifurcation into small radial and ulnar arteries  Radial artery occludes at calcific segment in forearm  Ulnar artery continues as outflow into left hand filling a complete palmar arch with retrograde filling of radial artery  Underfilling of digital arteries  Patent infrarenal aorta  Patent superior mesenteric artery and celiac artery    Right Left  RA Patent with 90% mid-segment stenosis Patent with tapering  CIA Patent but diseased with extensive calcification atherosclerosis with 75-90% proximal stenosis: resolved with stenting Patent but diseased with extensive calcification  EIA Patent but diseased with extensive calcification Patent but diseased with extensive calcification    IIA Patent proximally, diseased distally Patent proximally, diseased distally  CFA Patent with ~50% stenosis Patent  SFA Flush occlusion Patent with diffuse disease <50% stenoses  PFA Patent Patent  Pop Occluded Patent with diffuse disease <50% stenoses  Trif BKA Patent but diseased  tibioperoneal trunk 75-90%  AT BKA Patent, dominant runoff  Pero BKA Patent, miniscule  PT BKA Occluded   SPECIMEN(S):  none  INDICATIONS:   Gerald Davenport is a 75 y.o. male who presents with continued steal symptoms in right arm despite removed of his HeRO graft-catheter.  He also presents with worsening of pain in Left foot.  The patient presents for: Right arm angiogram and left leg angiogram.  I discussed with the patient the nature of angiographic procedures, especially the limited patencies of any endovascular intervention.  The patient is aware of that the risks of an angiographic procedure include but are not limited to: bleeding, infection, access site complications, renal failure, embolization, rupture of vessel, dissection, possible need for emergent surgical intervention, possible need for surgical procedures to treat the patient's pathology, and stroke and death.  The patient is aware of the risks and agrees to proceed.  DESCRIPTION: After full informed consent was obtained from the patient, the patient was brought back to the angiography suite.  The patient was placed supine upon the angiography table and connected to cardiopulmonary monitoring equipment.  The patient was then given conscious sedation, the amounts of which are documented in the patient's chart.  A circulating radiologic technician maintained continuous monitoring of the patient's cardiopulmonary status.  Additionally, the control room radiologic technician provided backup monitoring throughout the procedure.  The patient was prepped and drape in the standard fashion for an angiographic procedure.  At this point, attention was turned to the right groin.  Under ultrasound guidance, the subcutaneous tissue surrounding the right common femoral artery was anesthesized with 1% lidocaine with epinephrine.  The artery was then cannulated with a micropuncture needle.  The microwire was advanced into the  iliac arterial system.  The  needle was exchanged for a microsheath, which was loaded into the common femoral artery over the wire.  The microwire was exchanged for a [facility] wire which was advanced into the aorta.  The microsheath was then exchanged for a 5-Fr sheath which was loaded into the common femoral artery.  A long pigtail catheter was loaded over the wire into the ascending aorta.  The catheter was connected to the power injector circuit after a de-airring and de-clotting procedure.  A 40 degree LAO arch aortogram was completed.  The findings are listed above.  I pulled the catheter down into the descending thoracic aorta.  The catheter was exchanged for a BER-2 catheter.  Using this combination, I was able to select the innominate artery, but not the subclavian artery.  Another injection was completed to image the bifurcation into the right subclavian artery and common carotid artery.  I then selected the right subclavian artery and then advanced the catheter into the right axillary artery.  The wire was removed and the catheter connected to the power injector.  The right arm angiogram was completed in stations.  I replaced the wire in the catheter and pulled both down into the abdominal aorta.    The BER-2 catheter was exchanged for the pigtail catheter.  The wire was removed and the catheter connected to the power injector circuit.  An automated aortogram was completed.  The findings are listed above.  I pulled the catheter down to the distal aorta.  An automated bilateral leg runoff was completed.  The findings are listed above. Based on the images, I felt intervention on the right common iliac artery was necessary to maintain perfusion to his right leg.  I replaced the wire into the catheter, straightening out the crook in the catheter.  The catheter was removed.  I exchanged the sheath over the wire for a long 7-Fr sheath.  The patient was given 8000 units of Heparin intravenously, which was a therapeutic bolus. I did a  repeat magnified iliac injection to image the stenosis.  I placed a Rosen wire through the sheath.  I selected a 7 mm x 16 mm covered stent based on measurements.  This was placed centered on the common iliac artery stenosis.  This was deployed at 8 atm for 1 minute.  The balloon was deflated and removed.  Completion imaging demonstrated resolution of the stenosis with some undersizing despite the measurements.  At this point, I pulled the right sheath back into the right external iliac artery.  The sheath was aspirated.  No clots were present and the sheath was reloaded with heparinized saline.  The sheath will be removed once anticoagulation reverses.   COMPLICATIONS: none  CONDITION: stable  Adele Barthel, MD [facility] [place] Office: [phone] Pager: [phone]  05/29/2016, 10:19 AM

## 2016-05-29 NOTE — Interval H&P Note (Signed)
History and Physical Interval Note:  05/29/2016 8:09 AM  Gerald Davenport  has presented today for surgery, with the diagnosis of right hand pain - left foot pain  The various methods of treatment have been discussed with the patient and family. After consideration of risks, benefits and other options for treatment, the patient has consented to  Procedure(s): Upper Extremity Angiography (N/A) Lower Extremity Angiography (N/A) as a surgical intervention .  The patient's history has been reviewed, patient examined, no change in status, stable for surgery.  I have reviewed the patient's chart and labs.  Questions were answered to the patient's satisfaction.     Adele Barthel

## 2016-05-29 NOTE — Interval H&P Note (Signed)
[facility] [place]  History and Physical Update  I reiterated the stroke risks of the RUE angiogram.  I suspect this patient has significant great vessel disease given the muscle wasting found in both hands.  Unfortunately, I can't determine any intervention options without the angiogram.     Adele Barthel, MD [facility] Office: [phone] Pager: [phone]  05/29/2016, 8:11 AM

## 2016-05-29 NOTE — H&P (View-Only) (Signed)
History of Present Illness:    Gerald Davenport is a 75 y.o. (09/01/1941) male  who underwent HeRO (Date: [id number]) complicated by steal syndrome leading to Removal of HeRO, RIJV TDC placement.  He still has complaints of right hand pain and decreased use.  His newest problem includes left foot pain.  He denies ulcers on the left LE.  He has had a previous right BKA.  He has recent ABI study ordered by Dr. Gwenlyn Found.  Pt denies any rest pain.   Past Medical History:  Diagnosis Date  . Anemia   . Anxiety   . Cancer [facility])    Prostate cancer treated with radiation therapy  . Chronic kidney disease    dialysis Mon, Wednes, Fri  . Clot 05/2006   Arterial clot right leg   . Constipation   . Diabetes mellitus    Type II  . Diverticulosis   . GERD (gastroesophageal reflux disease)   . Hemodialysis patient ([place]) 6/13  . Hyperlipidemia   . Hypertension   . Infection    drainage from distal incision of left upper arm arteriovenous graft  . Positive PPD   . Shortness of breath dyspnea    with exertion, when he has too much fluid  . Steal syndrome dialysis vascular access ([place])   . Thyroid disease    hyperparathyroidism  . Wears glasses     Past Surgical History:  Procedure Laterality Date  . AV FISTULA PLACEMENT  11/04/2010   left and left  . AV FISTULA PLACEMENT  11/20/2011   Procedure: INSERTION OF ARTERIOVENOUS (AV) GORE-TEX GRAFT ARM;  Surgeon: Angelia Mould, MD;  Location: [place];  Service: Vascular;  Laterality: Right;  . AV FISTULA PLACEMENT Right 04/19/2015   Procedure: Excision of infected RUA AVG;  Surgeon: Conrad Edmondson, MD;  Location: [place];  Service: Vascular;  Laterality: Right;  . AV FISTULA PLACEMENT Left 10/04/2015   Procedure: INSERTION OF ARTERIOVENOUS (AV) GORE-TEX GRAFT ARM;  Surgeon: Conrad Pomona, MD;  Location: [place];  Service: Vascular;  Laterality: Left;  . [place] REMOVAL Left 10/13/2015   Procedure: REMOVAL OF INFECTED ARTERIOVENOUS GORETEX LEFT GRAFT ;  Surgeon:  Conrad Bruno, MD;  Location: [place];  Service: Vascular;  Laterality: Left;  . BASCILIC VEIN TRANSPOSITION Left 06/19/2015   Procedure: FIRST STAGE BRACHIAL VEIN TRANSPOSITION;  Surgeon: Conrad [place] Lake, MD;  Location: [place];  Service: Vascular;  Laterality: Left;  . 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 COLONOSCOPY N/A 11/10/2012   [id number] proctitis/Pancolonic diverticulosis/(TUBULAR ADENOMA)5 mm polyp in the transverse segment/4 mm polyps in the base of the cecum and diminutive polyp in the descending segment. INADEQUATE BOWEL PREP  . COLONOSCOPY N/A 11/28/2013   RMR: Radiation proctitis. Colonic diverticulosis. Colonic polyps-removed as described above.   . INSERTION OF DIALYSIS CATHETER Right 01/19/2016   Procedure: INSERTION OF Right internal jugular  DIALYSIS CATHETER.;  Surgeon: Elam Dutch, MD;  Location: [place];  Service: Vascular;  Laterality: Right;  . PERIPHERAL VASCULAR CATHETERIZATION Bilateral 12/06/2015   Procedure: Upper Extremity Venography/ central venogram;  Surgeon: Conrad Forestdale, MD;  Location: [place] [facility];  Service: Cardiovascular;  Laterality: Bilateral;  . REMOVAL OF A HERO DEVICE Right 01/19/2016   Procedure: REMOVAL OF A HERO DEVICE;  Surgeon: Elam Dutch, MD;  Location: [place];  Service: Vascular;  Laterality: Right;  . SHUNTOGRAM N/A 10/08/2012   Procedure: [name];  Surgeon: Juanda Crumble  Antony Blackbird, MD;  Location: [place] [facility];  Service: Cardiovascular;  Laterality: N/A;  . VASCULAR ACCESS DEVICE INSERTION Right 01/01/2016   Procedure: INSERTION OF HERO VASCULAR ACCESS DEVICE - RIGHT;  Surgeon: Conrad Harbine, MD;  Location: [place];  Service: Vascular;  Laterality: Right;     Social History Social History  Substance Use Topics  . Smoking status: Light Tobacco Smoker    Packs/day: 0.25    Years: 50.00    Types: Cigarettes  . Smokeless tobacco: Never Used     Comment: 4-5 cigarettes daily  . Alcohol use No    Family History Family History    Problem Relation Age of Onset  . Heart disease Mother   . Diabetes Mother   . Hypertension Mother   . Stroke Mother   . Heart disease Father   . Diabetes Father   . Heart disease Sister   . Diabetes Brother   . Colon cancer Neg Hx     Allergies  No Known Allergies   Current Outpatient Prescriptions  Medication Sig Dispense Refill  . acetaminophen-codeine (TYLENOL #3) 300-30 MG tablet Take 1 tablet by mouth every 4 (four) hours as needed for moderate pain. 10 tablet 0  . aspirin EC 81 MG tablet Take 81-162 mg by mouth 2 (two) times daily. Take 2 tablets (162 mg) by mouth every morning and 1 tablet (81 mg) at night    . buPROPion (WELLBUTRIN XL) 150 MG 24 hr tablet Take 150 mg by mouth daily.    . calcium acetate (PHOSLO) 667 MG capsule Take 667-1,334 mg by mouth See admin instructions. Take 2 capsules (1334 mg) by mouth with breakfast and with supper, take 1 capsule (667 mg) with snacks or small meals    . cloNIDine (CATAPRES) 0.2 MG tablet Take 1 tablet (0.2 mg total) by mouth 3 (three) times daily. 42 tablet 0  . doxazosin (CARDURA) 8 MG tablet Take 8 mg by mouth daily. Reported on 02/26/2016    . furosemide (LASIX) 40 MG tablet Take 40 mg by mouth daily.    Marland Kitchen gabapentin (NEURONTIN) 100 MG capsule Take 100 mg by mouth 2 (two) times daily.    . insulin detemir (LEVEMIR) 100 UNIT/ML injection Inject 25 Units into the skin at bedtime.     Marland Kitchen omeprazole (PRILOSEC) 20 MG capsule Take 20 mg by mouth 2 (two) times daily before a meal.     . oxyCODONE-acetaminophen (PERCOCET/ROXICET) 5-325 MG tablet Take 1 tablet by mouth every 6 (six) hours as needed. 20 tablet 0  . Polyvinyl Alcohol-Povidone (REFRESH OP) Place 1 drop into both eyes at bedtime.    . psyllium (METAMUCIL) 58.6 % powder Take 1 packet by mouth 3 (three) times daily.    . QUEtiapine (SEROQUEL) 100 MG tablet Take 100 mg by mouth at bedtime.    . saxagliptin HCl (ONGLYZA) 5 MG TABS tablet Take 5 mg by mouth daily with breakfast.      . simvastatin (ZOCOR) 20 MG tablet Take 20 mg by mouth daily.    . Vancomycin (VANCOCIN) 750 MG/150ML SOLN Inject 150 mLs (750 mg total) into the vein every Monday, Wednesday, and Friday with hemodialysis. 450 mL 0  . Linaclotide (LINZESS) 145 MCG CAPS capsule Take 1 capsule (145 mcg total) by mouth daily. Take 30 minutes before breakfast (Patient not taking: Reported on 05/23/2016) 30 capsule 11  . SENSIPAR 30 MG tablet Take 1 tablet by mouth daily.     No current facility-administered medications  for this visit.     ROS:   General:  No weight loss, Fever, chills  HEENT: No recent headaches, no nasal bleeding, no visual changes, no sore throat  Neurologic: No dizziness, blackouts, seizures. No recent symptoms of stroke or mini- stroke. No recent episodes of slurred speech, or temporary blindness.  Cardiac: No recent episodes of chest pain/pressure, no shortness of breath at rest.  No shortness of breath with exertion.  Denies history of atrial fibrillation or irregular heartbeat  Vascular: No history of rest pain in feet.  No history of claudication.  No history of non-healing ulcer, No history of DVT   Pulmonary: No home oxygen, no productive cough, no hemoptysis,  No asthma or wheezing  Musculoskeletal:  [ ]  Arthritis, [ ]  Low back pain,  [ ]  Joint pain [x]  muscle waisting with palmer pain right hand [x]  right BKA  Hematologic:No history of hypercoagulable state.  No history of easy bleeding.  No history of anemia  Gastrointestinal: No hematochezia or melena,  No gastroesophageal reflux, no trouble swallowing  Urinary: [x ] chronic Kidney disease, [ ]  on HD - [x ] MWF or [ ]  TTHS, [ ]  Burning with urination, [ ]  Frequent urination, [ ]  Difficulty urinating;   Skin: No rashes  Psychological: No history of anxiety,  No history of depression   Physical Examination  Vitals:   05/23/16 1426  BP: 120/68  Pulse: 91  Resp: 20  Temp: 98.9 F (37.2 C)  TempSrc: Oral  SpO2:  98%  Weight: 154 lb (69.9 kg)  Height: 5\' 9"  (1.753 m)    Body mass index is 22.74 kg/m.  General:  Alert and oriented, no acute distress HEENT: Normal Neck: No bruit or JVD Pulmonary: Clear to auscultation bilaterally Cardiac: Regular Rate and Rhythm without murmur Gastrointestinal: Soft, non-tender, non-distended Skin: No rash Extremity Pulses:  palpable left radial monophasic doppler right, brachial pulses bilaterally Musculoskeletal: No edema , Thenar Muscle Atrophy  Right hand > left Neurologic: Upper and lower extremity motor 5/5 and symmetric  DATA:  Dr. Gwenlyn Found ordered ABI and arterial duplex on 11/06/2015 Left LE  0.76 and TBI 0.45 on the left LE with monophasic wave forms.  This was reviewed with Dr. Bridgett Larsson today.    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  Since he is not showing improvement in his right UE symptoms we will plan on right UE angiogram to better delineate his UE arterial insufficieny.    His ABI's show mild creased arterial flow with monophasic wave forms on the arterial duplex.   At this point he does not have skin breakdown, but complaints of left LE pain.  He has had a right BKA due to sever PAD.  We will perform a left LE angiogram with run off and possible intervention at the same time as the right UE angiogram.  Theda Sers, EMMA [facility] PA-C [facility]  The patient was seen in conjunction with Dr. Bridgett Larsson today  Addendum  I have independently interviewed and examined the patient, and I agree with the physician assistant's findings.  Pt has significant sx in R arm with obvious wasting in R>L hands.  Would recommend: RUE angiogram to see if any optimization in flow can be accomplished.  Would proceed also with L leg angiogram at the same time given progression in sx in  the left leg.   I discussed with the patient  the nature of angiographic procedures, especially the limited patencies of any endovascular intervention.    The patient is aware of that the risks of an angiographic procedure include but are not limited to: bleeding, infection, access site complications, renal failure, embolization, rupture of vessel, dissection, arteriovenous fistula, possible need for emergent surgical intervention, possible need for surgical procedures to treat the patient's pathology, anaphylactic reaction to contrast, and stroke and death.    The patient is aware of the risks and agrees to proceed.  The patient is being scheduled for this coming Thursday 31 AUG 17.   Adele Barthel, MD [facility] [place] Office: [phone] Pager: [phone]  05/23/2016, 3:43 PM

## 2016-05-30 ENCOUNTER — Encounter (HOSPITAL_COMMUNITY): Payer: Self-pay | Admitting: Vascular Surgery

## 2016-05-30 ENCOUNTER — Telehealth: Payer: Self-pay | Admitting: Vascular Surgery

## 2016-05-30 NOTE — Telephone Encounter (Signed)
-----   Message from Mena Goes, RN sent at 05/30/2016  9:05 AM EDT ----- Regarding: schedule 4 weeks   ----- Message ----- From: Conrad Screven, MD Sent: 05/29/2016  10:38 AM To: [address]  Gerald Davenport [id number] 04/16/1941  1.  Right common femoral artery cannulation under ultrasound guidance 2.  Placement of catheter in aorta 3.  Arch aortogram 4.  Third order arterial selection 5.  Right arm angiogram 6.  Abdominal aortogram 7.  Bilateral leg runoff 8.  Right common iliac artery angioplasty and stenting (covered 7 mm x 16 mm)  Follow-up: 4 weeks

## 2016-05-30 NOTE — Telephone Encounter (Signed)
Sched appt 9/27 at 9:15. Lm on hm# to inform pt.

## 2016-06-20 ENCOUNTER — Encounter: Payer: Self-pay | Admitting: Vascular Surgery

## 2016-06-23 NOTE — Progress Notes (Deleted)
Postoperative Visit   History of Present Illness  Gerald Davenport is a 75 y.o. male with BUE steal complications with B hand atrophy and s/p R BKA and LLE intermittent claudication who presents for postoperative follow-up from procedure on Date: 05/29/16: R CIA PTA+S, R arm angiogram, BRo.  The patient's wounds are *** healed.  The patient notes *** resolution of lower extremity symptoms.  The patient is *** able to complete their activities of daily living.  The patient's current symptoms are: ***.  Past Medical History, Past Surgical History, Social History, Family History, Medications, Allergies, and Review of Systems are unchanged from previous evaluation on 05/29/16  Current Outpatient Prescriptions  Medication Sig Dispense Refill  . acetaminophen-codeine (TYLENOL #3) 300-30 MG tablet Take 1 tablet by mouth every 4 (four) hours as needed for moderate pain. 10 tablet 0  . aspirin EC 81 MG tablet Take 81-162 mg by mouth See admin instructions. Take 2 tablets (162 mg) by mouth every morning and 1 tablet (81 mg) at night     . buPROPion (WELLBUTRIN XL) 150 MG 24 hr tablet Take 150 mg by mouth daily.    . calcium acetate (PHOSLO) 667 MG capsule Take 667-2,001 mg by mouth See admin instructions. Take 2001 mg by mouth 3 times daily. Take 667 mg by mouth with snacks.    . cloNIDine (CATAPRES) 0.2 MG tablet Take 1 tablet (0.2 mg total) by mouth 3 (three) times daily. 42 tablet 0  . doxazosin (CARDURA) 8 MG tablet Take 8 mg by mouth daily. Reported on 02/26/2016    . fluorometholone (FML) 0.1 % ophthalmic suspension Place 1 drop into both eyes at bedtime.    . furosemide (LASIX) 40 MG tablet Take 40 mg by mouth daily.    Marland Kitchen gabapentin (NEURONTIN) 100 MG capsule Take 100 mg by mouth 2 (two) times daily.    . insulin detemir (LEVEMIR) 100 UNIT/ML injection Inject 25 Units into the skin at bedtime.     . Linaclotide (LINZESS) 145 MCG CAPS capsule Take 1 capsule (145 mcg total) by mouth daily. Take 30  minutes before breakfast (Patient not taking: Reported on 05/23/2016) 30 capsule 11  . omeprazole (PRILOSEC) 20 MG capsule Take 20 mg by mouth 2 (two) times daily before a meal.     . oxyCODONE-acetaminophen (PERCOCET/ROXICET) 5-325 MG tablet Take 1 tablet by mouth every 6 (six) hours as needed. (Patient taking differently: Take 1 tablet by mouth every 6 (six) hours as needed for moderate pain. ) 20 tablet 0  . Polyvinyl Alcohol-Povidone (REFRESH OP) Place 1 drop into both eyes at bedtime.    . psyllium (METAMUCIL) 58.6 % powder Take 1 packet by mouth as needed (for stool).     . QUEtiapine (SEROQUEL) 100 MG tablet Take 100 mg by mouth at bedtime.    . saxagliptin HCl (ONGLYZA) 5 MG TABS tablet Take 5 mg by mouth daily with breakfast.     . SENSIPAR 30 MG tablet Take 30 mg by mouth daily.     . simvastatin (ZOCOR) 20 MG tablet Take 20 mg by mouth daily.    . Vancomycin (VANCOCIN) 750 MG/150ML SOLN Inject 150 mLs (750 mg total) into the vein every Monday, Wednesday, and Friday with hemodialysis. 450 mL 0   No current facility-administered medications for this visit.     On ROS: ***rest pain, ***gangrene or ulceration.   For VQI Use Only  PRE-ADM LIVING: Home  AMB STATUS: Ambulatory   Physical Examination  There were no vitals filed for this visit.   There is no height or weight on file to calculate BMI.  General: A&O x 3, WDWN***, Cachectic / Ill appear / Somulent  Pulmonary: Sym exp, good air movt, CTAB, no rales, rhonchi, & wheezing***, + rales / + rhonchi / + wheezing  Cardiac: RRR, Nl S1, S2, no Murmurs, rubs or gallops***, S3/S4, Irregularly, irregular rhythm and rate  Vascular: Vessel Right Left  Radial Palpable Palpable  Ulnar Palpable Palpable  Brachial Palpable Palpable  Carotid Palpable, without bruit Palpable, without bruit  Aorta Not palpable N/A  Femoral Palpable Palpable  Popliteal Not palpable Not palpable  PT *** Palpable *** Palpable  DP *** Palpable ***  Palpable    Gastrointestinal: soft, NTND, -G/R, - HSM, - masses, - CVAT B***, + AAA / Surg. Inc TTP: (RUQ / RLQ / LUQ / LLQ / Epigastric), +G / +R  Musculoskeletal: M/S 5/5 throughout *** except ***, Extremities without ischemic changes *** except  ***, *** groin with***out hematoma***, *** echymosis present at cannulation site  Neurologic:  Pain and light touch intact in extremities *** except ***, Motor exam as listed above   Medical Decision Making  Gerald Davenport is a 75 y.o. male who presents s/p PTA+S R CIA, LLE multi-level PAD, R arm digital and radial disease.  Based on his angiographic findings, this patient needs: ***. I discussed in depth with the patient the nature of atherosclerosis, and emphasized the importance of maximal medical management including strict control of blood pressure, blood glucose, and lipid levels, obtaining regular exercise, and cessation of smoking.  The patient is aware that without maximal medical management the underlying atherosclerotic disease process will progress, limiting the benefit of any interventions. The patient is currently on a statin: Zocor. The patient is currently on an anti-platelet: ASA.  Thank you for allowing [place] to participate in this patient's care.  Adele Barthel, MD, FACS [facility] [place] Office: [phone] Pager: [phone]

## 2016-06-25 ENCOUNTER — Ambulatory Visit: Payer: Medicare Other | Admitting: Vascular Surgery

## 2016-06-26 ENCOUNTER — Encounter: Payer: Self-pay | Admitting: Vascular Surgery

## 2016-06-26 ENCOUNTER — Ambulatory Visit (INDEPENDENT_AMBULATORY_CARE_PROVIDER_SITE_OTHER): Payer: Medicare Other | Admitting: Vascular Surgery

## 2016-06-26 VITALS — BP 140/64 | HR 84 | Temp 97.7°F | Resp 18 | Ht 69.0 in | Wt 153.0 lb

## 2016-06-26 DIAGNOSIS — I70212 Atherosclerosis of native arteries of extremities with intermittent claudication, left leg: Secondary | ICD-10-CM | POA: Diagnosis not present

## 2016-06-26 DIAGNOSIS — T827XXA Infection and inflammatory reaction due to other cardiac and vascular devices, implants and grafts, initial encounter: Secondary | ICD-10-CM | POA: Diagnosis not present

## 2016-06-26 NOTE — Progress Notes (Addendum)
Postoperative Visit   History of Present Illness  Gerald Davenport is a 75 y.o. (December 30, 1940) male  with BUE steal complications with B hand atrophy and s/p R BKA and LLE intermittent claudication who presents for postoperative follow-up from procedure on Date: 05/29/16: R CIA PTA+S, R arm angiogram, BRo.  The patient notes his L leg intermittent claudication is tolerable and not lifestyle limiting at this point.  The patient is able to complete their activities of daily living.  The patient feels his hands are getting stronger and his steal sx are better.  Past Medical History, Past Surgical History, Social History, Family History, Medications, Allergies, and Review of Systems are unchanged from previous evaluation on 05/29/16  Current Outpatient Prescriptions  Medication Sig Dispense Refill  . acetaminophen-codeine (TYLENOL #3) 300-30 MG tablet Take 1 tablet by mouth every 4 (four) hours as needed for moderate pain. 10 tablet 0  . aspirin EC 81 MG tablet Take 81-162 mg by mouth See admin instructions. Take 2 tablets (162 mg) by mouth every morning and 1 tablet (81 mg) at night     . buPROPion (WELLBUTRIN XL) 150 MG 24 hr tablet Take 150 mg by mouth daily.    . calcium acetate (PHOSLO) 667 MG capsule Take 667-2,001 mg by mouth See admin instructions. Take 2001 mg by mouth 3 times daily. Take 667 mg by mouth with snacks.    . cloNIDine (CATAPRES) 0.2 MG tablet Take 1 tablet (0.2 mg total) by mouth 3 (three) times daily. 42 tablet 0  . doxazosin (CARDURA) 8 MG tablet Take 8 mg by mouth daily. Reported on 02/26/2016    . fluorometholone (FML) 0.1 % ophthalmic suspension Place 1 drop into both eyes at bedtime.    . furosemide (LASIX) 40 MG tablet Take 40 mg by mouth daily.    Marland Kitchen gabapentin (NEURONTIN) 100 MG capsule Take 100 mg by mouth 2 (two) times daily.    . insulin detemir (LEVEMIR) 100 UNIT/ML injection Inject 25 Units into the skin at bedtime.     . Linaclotide (LINZESS) 145 MCG CAPS capsule Take  1 capsule (145 mcg total) by mouth daily. Take 30 minutes before breakfast (Patient not taking: Reported on 05/23/2016) 30 capsule 11  . omeprazole (PRILOSEC) 20 MG capsule Take 20 mg by mouth 2 (two) times daily before a meal.     . oxyCODONE-acetaminophen (PERCOCET/ROXICET) 5-325 MG tablet Take 1 tablet by mouth every 6 (six) hours as needed. (Patient taking differently: Take 1 tablet by mouth every 6 (six) hours as needed for moderate pain. ) 20 tablet 0  . Polyvinyl Alcohol-Povidone (REFRESH OP) Place 1 drop into both eyes at bedtime.    . psyllium (METAMUCIL) 58.6 % powder Take 1 packet by mouth as needed (for stool).     . QUEtiapine (SEROQUEL) 100 MG tablet Take 100 mg by mouth at bedtime.    . saxagliptin HCl (ONGLYZA) 5 MG TABS tablet Take 5 mg by mouth daily with breakfast.     . SENSIPAR 30 MG tablet Take 30 mg by mouth daily.     . simvastatin (ZOCOR) 20 MG tablet Take 20 mg by mouth daily.    . Vancomycin (VANCOCIN) 750 MG/150ML SOLN Inject 150 mLs (750 mg total) into the vein every Monday, Wednesday, and Friday with hemodialysis. 450 mL 0   No current facility-administered medications for this visit.    On ROS: no rest pain, no gangrene or ulceration.   For VQI Use Only  PRE-ADM LIVING: Home  AMB STATUS: Ambulatory   Physical Examination  Vitals:   06/26/16 0940  BP: 140/64  Pulse: 84  Resp: 18  Temp: 97.7 F (36.5 C)  TempSrc: Oral  SpO2: 98%  Weight: 153 lb (69.4 kg)  Height: 5\' 9"  (1.753 m)   Body mass index is 22.59 kg/m.  General: A&O x 3, WDWN  Pulmonary: Sym exp, good air movt, CTAB, no rales, rhonchi, & wheezing  Cardiac: RRR, Nl S1, S2, no Murmurs, rubs or gallops  Vascular: Vessel Right Left  Radial Palpable Palpable  Ulnar Palpable Palpable  Brachial Palpable Palpable  Carotid Palpable, without bruit Palpable, without bruit  Aorta Not palpable N/A  Femoral Palpable Palpable  Popliteal Not palpable Not palpable  PT BKA Not Palpable  DP  BKA Not Palpable   Gastrointestinal: soft, NTND, -G/R, - HSM, - masses, - CVAT B  Musculoskeletal: M/S 5/5 throughout , Extremities without ischemic changes except R BKA, L foot with some onychomycosis involving his toe nails,  R groin without hematoma, no echymosis present at cannulation site  Neurologic:  Pain and light touch intact in extremities , Motor exam as listed above   Medical Decision Making  Gerald Davenport is a 75 y.o. (Jul 19, 1941) male who presents s/p PTA+S R CIA, LLE multi-level PAD, R arm digital and radial disease.  Pt has expressed his desire to limit any further surgery. At this point, he declines any thigh AVG He would also like to avoid any surgery to his legs also. Based on his angiographic findings, this patient needs: q3 month ABI. I discussed in depth with the patient the nature of atherosclerosis, and emphasized the importance of maximal medical management including strict control of blood pressure, blood glucose, and lipid levels, obtaining regular exercise, and cessation of smoking.  The patient is aware that without maximal medical management the underlying atherosclerotic disease process will progress, limiting the benefit of any interventions. The patient is currently on a statin: Zocor. The patient is currently on an anti-platelet: ASA.  Thank you for allowing [place] to participate in this patient's care.  Adele Barthel, MD, FACS [facility] [place] Office: [phone] Pager: [phone]

## 2016-06-27 ENCOUNTER — Emergency Department (HOSPITAL_COMMUNITY)
Admission: EM | Admit: 2016-06-27 | Discharge: 2016-06-28 | Disposition: A | Discharge status: Home / Self Care | Payer: Medicare Other | Attending: Emergency Medicine | Admitting: Emergency Medicine

## 2016-06-27 ENCOUNTER — Encounter (HOSPITAL_COMMUNITY): Payer: Self-pay | Admitting: Emergency Medicine

## 2016-06-27 ENCOUNTER — Emergency Department (HOSPITAL_COMMUNITY): Payer: Medicare Other

## 2016-06-27 DIAGNOSIS — N189 Chronic kidney disease, unspecified: Secondary | ICD-10-CM | POA: Insufficient documentation

## 2016-06-27 DIAGNOSIS — Z794 Long term (current) use of insulin: Secondary | ICD-10-CM | POA: Diagnosis not present

## 2016-06-27 DIAGNOSIS — Z79899 Other long term (current) drug therapy: Secondary | ICD-10-CM | POA: Insufficient documentation

## 2016-06-27 DIAGNOSIS — N186 End stage renal disease: Secondary | ICD-10-CM | POA: Insufficient documentation

## 2016-06-27 DIAGNOSIS — I120 Hypertensive chronic kidney disease with stage 5 chronic kidney disease or end stage renal disease: Secondary | ICD-10-CM | POA: Diagnosis not present

## 2016-06-27 DIAGNOSIS — T82838A Hemorrhage of vascular prosthetic devices, implants and grafts, initial encounter: Secondary | ICD-10-CM | POA: Insufficient documentation

## 2016-06-27 DIAGNOSIS — F17210 Nicotine dependence, cigarettes, uncomplicated: Secondary | ICD-10-CM | POA: Insufficient documentation

## 2016-06-27 DIAGNOSIS — Y713 Surgical instruments, materials and cardiovascular devices (including sutures) associated with adverse incidents: Secondary | ICD-10-CM | POA: Diagnosis not present

## 2016-06-27 DIAGNOSIS — E1122 Type 2 diabetes mellitus with diabetic chronic kidney disease: Secondary | ICD-10-CM | POA: Insufficient documentation

## 2016-06-27 LAB — CBC
HCT: 32.2 % — ABNORMAL LOW (ref 39.0–52.0)
Hemoglobin: 10.7 g/dL — ABNORMAL LOW (ref 13.0–17.0)
MCH: 36.1 pg — ABNORMAL HIGH (ref 26.0–34.0)
MCHC: 33.2 g/dL (ref 30.0–36.0)
MCV: 108.8 fL — ABNORMAL HIGH (ref 78.0–100.0)
Platelets: 178 10*3/uL (ref 150–400)
RBC: 2.96 MIL/uL — AB (ref 4.22–5.81)
RDW: 17 % — ABNORMAL HIGH (ref 11.5–15.5)
WBC: 8.5 10*3/uL (ref 4.0–10.5)

## 2016-06-27 MED ORDER — "THROMBI-PAD 3""X3"" EX PADS"
MEDICATED_PAD | CUTANEOUS | Status: AC
  Administered 2016-06-27: 23:00:00
  Filled 2016-06-27: qty 1

## 2016-06-27 NOTE — ED Triage Notes (Signed)
Pt had dialysis cath replaced today. Pt reports going to dialysis today, bleeding from site since. Bleeding noted at insertion site and at stitches.

## 2016-06-27 NOTE — ED Provider Notes (Signed)
[facility] DEPT Provider Note   CSN: [id number] Arrival date & time: 06/27/16  2236  By signing my name below, I, Gerald Davenport, attest that this documentation has been prepared under the direction and in the presence of physician practitioner, Orpah Greek, MD. Electronically Signed: [name], Scribe. 06/27/2016. 11:11 PM.  History   Chief Complaint Chief Complaint  Patient presents with  . Vascular Access Problem    The history is provided by the patient. No language interpreter was used.     HPI Comments: Gerald Davenport is a 75 y.o. male with PMHx of CKD who presents to the Emergency Department complaining of bleeding from the site of his dialysis catheter beginning today. Pt reports he went to dialysis this afternoon and had a catheter placed at his right shoulder/right upper chest area. He notes he was bleeding from the site at dialysis and the site continued to bleed at home. Pt notes he went through full dialysis treatment today. He states the site is not painful. He receives dialysis in [place]. Pt denies SOB, weakness, or any other associated symptoms.  Past Medical History:  Diagnosis Date  . Anemia   . Anxiety   . Cancer [facility])    Prostate cancer treated with radiation therapy  . Chronic kidney disease    dialysis Mon, Wednes, Fri  . Clot 05/2006   Arterial clot right leg   . Constipation   . Diabetes mellitus    Type II  . Diverticulosis   . GERD (gastroesophageal reflux disease)   . Hemodialysis patient ([place]) 6/13  . Hyperlipidemia   . Hypertension   . Infection    drainage from distal incision of left upper arm arteriovenous graft  . Positive PPD   . Shortness of breath dyspnea    with exertion, when he has too much fluid  . 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 Thyroid disease    hyperparathyroidism  . Wears glasses     Patient Active Problem List   Diagnosis Date Noted  . Atherosclerosis of native arteries of extremity  with intermittent claudication ([place]) 05/23/2016  . 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 Infection 10/13/2015  . Infection of arteriovenous graft for hemodialysis ([place]) 10/12/2015  . 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 Diabetic neuropathy associated with type 2 diabetes mellitus ([place]) 05/25/2015  . Infection, dialysis vascular access ([place]) 04/19/2015  . Rectal bleeding 02/06/2015  . SOB (shortness of breath) 03/24/2014  . Abnormal EKG 03/24/2014  . Essential hypertension, benign 03/15/2014  . Mixed hyperlipidemia 03/15/2014  . H/O adenomatous polyp of colon 11/07/2013  . Constipation 06/15/2013  . Encounter for screening colonoscopy 11/02/2012  . Fecal incontinence 09/10/2012  . GERD (gastroesophageal reflux disease) 09/10/2012  . Diabetes mellitus 08/06/2011  . CRF (chronic renal failure) 08/06/2011    Past Surgical History:  Procedure Laterality Date  . AV FISTULA PLACEMENT  11/04/2010   left and left  . AV FISTULA PLACEMENT  11/20/2011   Procedure: INSERTION OF ARTERIOVENOUS (AV) GORE-TEX GRAFT ARM;  Surgeon: Angelia Mould, MD;  Location: [place];  Service: Vascular;  Laterality: Right;  . AV FISTULA PLACEMENT Right 04/19/2015   Procedure: Excision of infected RUA AVG;  Surgeon: Conrad [place], MD;  Location: [place];  Service: Vascular;  Laterality: Right;  . AV FISTULA PLACEMENT Left 10/04/2015   Procedure: INSERTION OF ARTERIOVENOUS (AV) GORE-TEX GRAFT ARM;  Surgeon: Conrad Byng, MD;  Location: [place];  Service: Vascular;  Laterality: Left;  . [place] REMOVAL Left 10/13/2015   Procedure: REMOVAL OF INFECTED ARTERIOVENOUS GORETEX LEFT GRAFT ;  Surgeon: Conrad [place], MD;  Location: [place];  Service: Vascular;  Laterality: Left;  . BASCILIC VEIN TRANSPOSITION Left 06/19/2015   Procedure: FIRST STAGE BRACHIAL VEIN TRANSPOSITION;  Surgeon: Conrad Braselton, MD;  Location: [place];  Service: Vascular;  Laterality: Left;  . 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  COLONOSCOPY N/A 11/10/2012   [id number] proctitis/Pancolonic diverticulosis/(TUBULAR ADENOMA)5 mm polyp in the transverse segment/4 mm polyps in the base of the cecum and diminutive polyp in the descending segment. INADEQUATE BOWEL PREP  . COLONOSCOPY N/A 11/28/2013   RMR: Radiation proctitis. Colonic diverticulosis. Colonic polyps-removed as described above.   . INSERTION OF DIALYSIS CATHETER Right 01/19/2016   Procedure: INSERTION OF Right internal jugular  DIALYSIS CATHETER.;  Surgeon: Elam Dutch, MD;  Location: [place];  Service: Vascular;  Laterality: Right;  . PERIPHERAL VASCULAR CATHETERIZATION Bilateral 12/06/2015   Procedure: Upper Extremity Venography/ central venogram;  Surgeon: Conrad [place], MD;  Location: [place] CV LAB;  Service: Cardiovascular;  Laterality: Bilateral;  . PERIPHERAL VASCULAR CATHETERIZATION N/A 05/29/2016   Procedure: Upper Extremity Angiography;  Surgeon: Conrad Ullin, MD;  Location: [place] CV LAB;  Service: Cardiovascular;  Laterality: N/A;  . PERIPHERAL VASCULAR CATHETERIZATION N/A 05/29/2016   Procedure: Lower Extremity Angiography;  Surgeon: Conrad [place], MD;  Location: [place] CV LAB;  Service: Cardiovascular;  Laterality: N/A;  . PERIPHERAL VASCULAR CATHETERIZATION  05/29/2016   Procedure: Peripheral Vascular Intervention;  Surgeon: Conrad [place], MD;  Location: [place] CV LAB;  Service: Cardiovascular;;  . REMOVAL OF A HERO DEVICE Right 01/19/2016   Procedure: REMOVAL OF A HERO DEVICE;  Surgeon: Elam Dutch, MD;  Location: [place];  Service: Vascular;  Laterality: Right;  . SHUNTOGRAM N/A 10/08/2012   Procedure: [name];  Surgeon: Elam Dutch, MD;  Location: [facility] CATH LAB;  Service: Cardiovascular;  Laterality: N/A;  . VASCULAR ACCESS DEVICE INSERTION Right 01/01/2016   Procedure: INSERTION OF HERO VASCULAR ACCESS DEVICE - RIGHT;  Surgeon: Conrad , MD;  Location: [place];  Service: Vascular;  Laterality: Right;       Home  Medications    Prior to Admission medications   Medication Sig Start Date End Date Taking? Authorizing Provider  sevelamer carbonate (RENVELA) 800 MG tablet Take 800-1,600 mg by mouth 3 (three) times daily with meals.   Yes Historical Provider, MD  acetaminophen-codeine (TYLENOL #3) 300-30 MG tablet Take 1 tablet by mouth every 4 (four) hours as needed for moderate pain. 01/19/16   Elam Dutch, MD  aspirin EC 81 MG tablet Take 81-162 mg by mouth See admin instructions. Take 2 tablets (162 mg) by mouth every morning and 1 tablet (81 mg) at night     Historical Provider, MD  buPROPion (WELLBUTRIN XL) 150 MG 24 hr tablet Take 150 mg by mouth daily. 03/05/15   Historical Provider, MD  calcium acetate (PHOSLO) 667 MG capsule Take 667-2,001 mg by mouth See admin instructions. Take 2001 mg by mouth 3 times daily. Take 667 mg by mouth with snacks. 10/26/12   Historical Provider, MD  cloNIDine (CATAPRES) 0.2 MG tablet Take 1 tablet (0.2 mg total) by mouth 3 (three) times daily. 01/19/16   [name], MD  doxazosin (CARDURA) 8 MG tablet Take 8 mg by mouth daily. Reported on 02/26/2016  Historical Provider, MD  fluorometholone (FML) 0.1 % ophthalmic suspension Place 1 drop into both eyes at bedtime. 04/15/16   Historical Provider, MD  furosemide (LASIX) 40 MG tablet Take 40 mg by mouth daily.    Historical Provider, MD  gabapentin (NEURONTIN) 100 MG capsule Take 100 mg by mouth 2 (two) times daily. 04/13/15   Historical Provider, MD  insulin detemir (LEVEMIR) 100 UNIT/ML injection Inject 25 Units into the skin at bedtime.     Historical Provider, MD  Linaclotide Rolan Lipa) 145 MCG CAPS capsule Take 1 capsule (145 mcg total) by mouth daily. Take 30 minutes before breakfast Patient not taking: Reported on 06/26/2016 02/06/15   Carlis Stable, NP  omeprazole (PRILOSEC) 20 MG capsule Take 20 mg by mouth 2 (two) times daily before a meal.  02/20/15   Historical Provider, MD  oxyCODONE-acetaminophen (PERCOCET/ROXICET)  5-325 MG tablet Take 1 tablet by mouth every 6 (six) hours as needed. Patient taking differently: Take 1 tablet by mouth every 6 (six) hours as needed for moderate pain.  01/18/16   Kristen N Ward, DO  Polyvinyl Alcohol-Povidone (REFRESH OP) Place 1 drop into both eyes at bedtime.    Historical Provider, MD  psyllium (METAMUCIL) 58.6 % powder Take 1 packet by mouth as needed (for stool).     Historical Provider, MD  QUEtiapine (SEROQUEL) 100 MG tablet Take 200 mg by mouth at bedtime.  03/05/15   Historical Provider, MD  saxagliptin HCl (ONGLYZA) 5 MG TABS tablet Take 5 mg by mouth daily with breakfast.     Historical Provider, MD  SENSIPAR 30 MG tablet Take 30 mg by mouth daily.  11/20/15   Historical Provider, MD  simvastatin (ZOCOR) 20 MG tablet Take 20 mg by mouth daily.    Historical Provider, MD  Vancomycin (VANCOCIN) 750 MG/150ML SOLN Inject 150 mLs (750 mg total) into the vein every Monday, Wednesday, and Friday with hemodialysis. 10/17/15   Alvia Grove, PA-C    Family History Family History  Problem Relation Age of Onset  . Heart disease Mother   . Diabetes Mother   . Hypertension Mother   . Stroke Mother   . Heart disease Father   . Diabetes Father   . Heart disease Sister   . Diabetes Brother   . Colon cancer Neg Hx     Social History Social History  Substance Use Topics  . Smoking status: Light Tobacco Smoker    Packs/day: 0.25    Years: 50.00    Types: Cigarettes  . Smokeless tobacco: Never Used     Comment: 4-5 cigarettes daily  . Alcohol use No     Allergies   Review of patient's allergies indicates no known allergies.   Review of Systems Review of Systems  Respiratory: Negative for shortness of breath.   Neurological: Negative for weakness.  All other systems reviewed and are negative.    Physical Exam Updated Vital Signs BP 123/58   Pulse 98   Temp 97.7 F (36.5 C) (Oral)   Resp 16   Ht 5\' 9"  (1.753 m)   Wt 153 lb (69.4 kg)   SpO2 98%   BMI  22.59 kg/m   Physical Exam  Constitutional: He is oriented to person, place, and time. He appears well-developed and well-nourished. No distress.  HENT:  Head: Normocephalic and atraumatic.  Right Ear: Hearing normal.  Left Ear: Hearing normal.  Nose: Nose normal.  Mouth/Throat: Oropharynx is clear and moist and mucous membranes are normal.  Eyes: Conjunctivae and EOM are normal. Pupils are equal, round, and reactive to light.  Neck: Normal range of motion. Neck supple.  Cardiovascular: Regular rhythm, S1 normal and S2 normal.  Exam reveals no gallop and no friction rub.   No murmur heard. Pulmonary/Chest: Effort normal and breath sounds normal. No respiratory distress. He exhibits no tenderness.  Dialysis catheter to right chest. Small amount of dried blood, no active bleeding. No hematoma, redness, or drainage.  Abdominal: Soft. Normal appearance and bowel sounds are normal. There is no hepatosplenomegaly. There is no tenderness. There is no rebound, no guarding, no tenderness at McBurney's point and negative Murphy's sign. No hernia.  Musculoskeletal: Normal range of motion.  Neurological: He is alert and oriented to person, place, and time. He has normal strength. No cranial nerve deficit or sensory deficit. Coordination normal. GCS eye subscore is 4. GCS verbal subscore is 5. GCS motor subscore is 6.  Skin: Skin is warm, dry and intact. No rash noted. No cyanosis.  Psychiatric: He has a normal mood and affect. His speech is normal and behavior is normal. Thought content normal.  Nursing note and vitals reviewed.    ED Treatments / Results  Labs (all labs ordered are listed, but only abnormal results are displayed) Labs Reviewed  CBC - Abnormal; Notable for the following:       Result Value   RBC 2.96 (*)    Hemoglobin 10.7 (*)    HCT 32.2 (*)    MCV 108.8 (*)    MCH 36.1 (*)    RDW 17.0 (*)    All other components within normal limits  BASIC METABOLIC PANEL - Abnormal;  Notable for the following:    Sodium 131 (*)    Chloride 94 (*)    Glucose, Bld 217 (*)    BUN 22 (*)    Creatinine, Ser 6.96 (*)    Calcium 7.9 (*)    GFR calc non Af Amer 7 (*)    GFR calc Af Amer 8 (*)    All other components within normal limits  APTT - Abnormal; Notable for the following:    aPTT 42 (*)    All other components within normal limits  PROTIME-INR    EKG  EKG Interpretation None       Radiology Dg Chest 2 View  Result Date: 06/27/2016 CLINICAL DATA:  Bleeding from dialysis catheter, acute onset. Initial encounter. EXAM: CHEST  2 VIEW COMPARISON:  Chest radiograph performed 01/19/2016 FINDINGS: The lungs are well-aerated. Minimal bibasilar atelectasis is noted. There is no evidence of pleural effusion or pneumothorax. The heart is borderline normal in size. No acute osseous abnormalities are seen. A right axillary vascular stent is noted. A right-sided dual-lumen catheter is seen ending about the distal SVC. Mild calcification is seen along the proximal abdominal aorta. IMPRESSION: 1. Minimal bibasilar atelectasis noted.  Lungs otherwise clear. 2. Mild aortic atherosclerosis. Electronically Signed   By: Garald Balding M.D.   On: 06/27/2016 23:45    Procedures Procedures (including critical care time)  DIAGNOSTIC STUDIES: Oxygen Saturation is 99% on RA, normal by my interpretation.    COORDINATION OF CARE: 11:11 PM Discussed treatment plan with pt at bedside and pt agreed to plan.  Medications Ordered in ED Medications  THROMBI-PAD (THROMBI-PAD) 3"X3" pad (  Given 06/27/16 2259)     Initial Impression / Assessment and Plan / ED Course  I have reviewed the triage vital signs and the nursing notes.  Pertinent labs &  imaging results that were available during my care of the patient were reviewed by me and considered in my medical decision making (see chart for details).  Clinical Course   Patient presented with concerns of bleeding from a tunneled  dialysis catheter that was placed earlier today. He did have dialysis through the catheter today. He reports losing of blood around the tubing. Examination at arrival reveals dry blood without active bleeding. Lab work is normal. X-ray shows appropriate placement of the catheter. Patient reassured, bleeding is secondary to the [place] of the catheter and does not require any intervention at this time. Follow-up at dialysis.  I personally performed the services described in this documentation, which was scribed in my presence. The recorded information has been reviewed and is accurate.   Final Clinical Impressions(s) / ED Diagnoses   Final diagnoses:  Hemorrhage from dialysis catheter    New Prescriptions New Prescriptions   No medications on file     Orpah Greek, MD 06/28/16 [phone]

## 2016-06-27 NOTE — ED Notes (Signed)
Dressings around dialysis cath are saturated in bright red blood.  Blood is oozing from insertion side.  Thrombi pad placed around insertion site and taped down with hypa fix.

## 2016-06-27 NOTE — ED Notes (Signed)
Dressing remains clean, dry and intact

## 2016-06-27 NOTE — ED Notes (Signed)
MD at bedside. 

## 2016-06-28 LAB — BASIC METABOLIC PANEL
Anion gap: 10 (ref 5–15)
BUN: 22 mg/dL — AB (ref 6–20)
CALCIUM: 7.9 mg/dL — AB (ref 8.9–10.3)
CO2: 27 mmol/L (ref 22–32)
Chloride: 94 mmol/L — ABNORMAL LOW (ref 101–111)
Creatinine, Ser: 6.96 mg/dL — ABNORMAL HIGH (ref 0.61–1.24)
GFR calc Af Amer: 8 mL/min — ABNORMAL LOW (ref >60)
GFR calc non Af Amer: 7 mL/min — ABNORMAL LOW (ref >60)
GLUCOSE: 217 mg/dL — AB (ref 65–99)
Potassium: 4.6 mmol/L (ref 3.5–5.1)
Sodium: 131 mmol/L — ABNORMAL LOW (ref 135–145)

## 2016-06-28 LAB — PROTIME-INR
INR: 1.01
Prothrombin Time: 13.3 seconds (ref 11.4–15.2)

## 2016-06-28 LAB — APTT: APTT: 42 s — AB (ref 24–36)

## 2016-07-01 ENCOUNTER — Ambulatory Visit (INDEPENDENT_AMBULATORY_CARE_PROVIDER_SITE_OTHER): Payer: Medicare Other | Admitting: Podiatry

## 2016-07-01 ENCOUNTER — Encounter: Payer: Self-pay | Admitting: Podiatry

## 2016-07-01 VITALS — BP 132/62

## 2016-07-01 DIAGNOSIS — E1142 Type 2 diabetes mellitus with diabetic polyneuropathy: Secondary | ICD-10-CM

## 2016-07-01 DIAGNOSIS — B351 Tinea unguium: Secondary | ICD-10-CM

## 2016-07-01 DIAGNOSIS — L84 Corns and callosities: Secondary | ICD-10-CM

## 2016-07-01 DIAGNOSIS — M79672 Pain in left foot: Secondary | ICD-10-CM | POA: Diagnosis not present

## 2016-07-01 NOTE — Patient Instructions (Signed)
Diabetes and Foot Care Diabetes may cause you to have problems because of poor blood supply (circulation) to your feet and legs. This may cause the skin on your feet to become thinner, break easier, and heal more slowly. Your skin may become dry, and the skin may peel and crack. You may also have nerve damage in your legs and feet causing decreased feeling in them. You may not notice minor injuries to your feet that could lead to infections or more serious problems. Taking care of your feet is one of the most important things you can do for yourself.  HOME CARE INSTRUCTIONS  Wear shoes at all times, even in the house. Do not go barefoot. Bare feet are easily injured.  Check your feet daily for blisters, cuts, and redness. If you cannot see the bottom of your feet, use a mirror or ask someone for help.  Wash your feet with warm water (do not use hot water) and mild soap. Then pat your feet and the areas between your toes until they are completely dry. Do not soak your feet as this can dry your skin.  Apply a moisturizing lotion or petroleum jelly (that does not contain alcohol and is unscented) to the skin on your feet and to dry, brittle toenails. Do not apply lotion between your toes.  Trim your toenails straight across. Do not dig under them or around the cuticle. File the edges of your nails with an emery board or nail file.  Do not cut corns or calluses or try to remove them with medicine.  Wear clean socks or stockings every day. Make sure they are not too tight. Do not wear knee-high stockings since they may decrease blood flow to your legs.  Wear shoes that fit properly and have enough cushioning. To break in new shoes, wear them for just a few hours a day. This prevents you from injuring your feet. Always look in your shoes before you put them on to be sure there are no objects inside.  Do not cross your legs. This may decrease the blood flow to your feet.  If you find a minor scrape,  cut, or break in the skin on your feet, keep it and the skin around it clean and dry. These areas may be cleansed with mild soap and water. Do not cleanse the area with peroxide, alcohol, or iodine.  When you remove an adhesive bandage, be sure not to damage the skin around it.  If you have a wound, look at it several times a day to make sure it is healing.  Do not use heating pads or hot water bottles. They may burn your skin. If you have lost feeling in your feet or legs, you may not know it is happening until it is too late.  Make sure your health care provider performs a complete foot exam at least annually or more often if you have foot problems. Report any cuts, sores, or bruises to your health care provider immediately. SEEK MEDICAL CARE IF:   You have an injury that is not healing.  You have cuts or breaks in the skin.  You have an ingrown nail.  You notice redness on your legs or feet.  You feel burning or tingling in your legs or feet.  You have pain or cramps in your legs and feet.  Your legs or feet are numb.  Your feet always feel cold. SEEK IMMEDIATE MEDICAL CARE IF:   There is increasing redness,   swelling, or pain in or around a wound.  There is a red line that goes up your leg.  Pus is coming from a wound.  You develop a fever or as directed by your health care provider.  You notice a bad smell coming from an ulcer or wound.   This information is not intended to replace advice given to you by your health care provider. Make sure you discuss any questions you have with your health care provider.   Document Released: 09/12/2000 Document Revised: 05/18/2013 Document Reviewed: 02/22/2013 Elsevier Interactive Patient Education 2016 Elsevier Inc.  

## 2016-07-01 NOTE — Progress Notes (Signed)
   Subjective:    Patient ID: Gerald Davenport, male    DOB: [birth year], 75 y.o.   MRN: [id number]  Foot Pain   N - lesion  L - L lateral malleolus  D -  6 mo  O - gradual  C - sore A - shoes  T - none      Review of Systems     Objective:   Physical Exam        Assessment & Plan:

## 2016-07-01 NOTE — Progress Notes (Signed)
Patient ID: TAJH PINKHASOV, male   DOB: [birth year], 74 y.o.   MRN: [id number]   Subjective: This patient presents today complaining of uncomfortable toenails on the left foot and uncomfortable callus on the left foot requesting skin a nail debridement. 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

## 2016-07-01 NOTE — Addendum Note (Signed)
Addended by: Kaleen Mask on: 07/01/2016 01:25 PM   Modules accepted: Orders

## 2016-07-11 NOTE — Progress Notes (Unsigned)
Dictation #1 [id number]  [id number]

## 2016-09-08 ENCOUNTER — Emergency Department (HOSPITAL_COMMUNITY): Payer: Medicare Other

## 2016-09-08 ENCOUNTER — Encounter (HOSPITAL_COMMUNITY): Payer: Self-pay | Admitting: Emergency Medicine

## 2016-09-08 ENCOUNTER — Observation Stay (HOSPITAL_COMMUNITY)
Admission: EM | Admit: 2016-09-08 | Discharge: 2016-09-09 | Disposition: A | Discharge status: Home / Self Care | Payer: Medicare Other | Attending: Nephrology | Admitting: Nephrology

## 2016-09-08 DIAGNOSIS — R410 Disorientation, unspecified: Secondary | ICD-10-CM

## 2016-09-08 DIAGNOSIS — G9340 Encephalopathy, unspecified: Secondary | ICD-10-CM

## 2016-09-08 DIAGNOSIS — R4182 Altered mental status, unspecified: Secondary | ICD-10-CM | POA: Diagnosis present

## 2016-09-08 DIAGNOSIS — Z79899 Other long term (current) drug therapy: Secondary | ICD-10-CM | POA: Diagnosis not present

## 2016-09-08 DIAGNOSIS — T68XXXA Hypothermia, initial encounter: Secondary | ICD-10-CM | POA: Insufficient documentation

## 2016-09-08 DIAGNOSIS — E1122 Type 2 diabetes mellitus with diabetic chronic kidney disease: Secondary | ICD-10-CM | POA: Insufficient documentation

## 2016-09-08 DIAGNOSIS — Z8546 Personal history of malignant neoplasm of prostate: Secondary | ICD-10-CM | POA: Diagnosis not present

## 2016-09-08 DIAGNOSIS — E162 Hypoglycemia, unspecified: Secondary | ICD-10-CM | POA: Diagnosis not present

## 2016-09-08 DIAGNOSIS — S065X9A Traumatic subdural hemorrhage with loss of consciousness of unspecified duration, initial encounter: Secondary | ICD-10-CM

## 2016-09-08 DIAGNOSIS — Z992 Dependence on renal dialysis: Secondary | ICD-10-CM | POA: Diagnosis not present

## 2016-09-08 DIAGNOSIS — R2689 Other abnormalities of gait and mobility: Secondary | ICD-10-CM

## 2016-09-08 DIAGNOSIS — N186 End stage renal disease: Secondary | ICD-10-CM | POA: Insufficient documentation

## 2016-09-08 DIAGNOSIS — I6200 Nontraumatic subdural hemorrhage, unspecified: Secondary | ICD-10-CM | POA: Diagnosis not present

## 2016-09-08 DIAGNOSIS — X31XXXA Exposure to excessive natural cold, initial encounter: Secondary | ICD-10-CM | POA: Insufficient documentation

## 2016-09-08 DIAGNOSIS — I10 Essential (primary) hypertension: Secondary | ICD-10-CM | POA: Diagnosis not present

## 2016-09-08 DIAGNOSIS — E11649 Type 2 diabetes mellitus with hypoglycemia without coma: Secondary | ICD-10-CM | POA: Diagnosis not present

## 2016-09-08 DIAGNOSIS — Z7984 Long term (current) use of oral hypoglycemic drugs: Secondary | ICD-10-CM | POA: Insufficient documentation

## 2016-09-08 DIAGNOSIS — I120 Hypertensive chronic kidney disease with stage 5 chronic kidney disease or end stage renal disease: Secondary | ICD-10-CM | POA: Diagnosis not present

## 2016-09-08 DIAGNOSIS — S065XAA Traumatic subdural hemorrhage with loss of consciousness status unknown, initial encounter: Secondary | ICD-10-CM

## 2016-09-08 DIAGNOSIS — F17210 Nicotine dependence, cigarettes, uncomplicated: Secondary | ICD-10-CM | POA: Diagnosis not present

## 2016-09-08 LAB — CBG MONITORING, ED
GLUCOSE-CAPILLARY: 12 mg/dL — AB (ref 65–99)
GLUCOSE-CAPILLARY: 257 mg/dL — AB (ref 65–99)
GLUCOSE-CAPILLARY: 172 mg/dL — AB (ref 65–99)
Glucose-Capillary: 226 mg/dL — ABNORMAL HIGH (ref 65–99)

## 2016-09-08 LAB — CBC WITH DIFFERENTIAL/PLATELET
BASOS ABS: 0 10*3/uL (ref 0.0–0.1)
BASOS PCT: 0 %
EOS ABS: 0 10*3/uL (ref 0.0–0.7)
Eosinophils Relative: 0 %
HCT: 32.6 % — ABNORMAL LOW (ref 39.0–52.0)
Hemoglobin: 10.7 g/dL — ABNORMAL LOW (ref 13.0–17.0)
Lymphocytes Relative: 4 %
Lymphs Abs: 0.7 10*3/uL (ref 0.7–4.0)
MCH: 35.5 pg — ABNORMAL HIGH (ref 26.0–34.0)
MCHC: 32.8 g/dL (ref 30.0–36.0)
MCV: 108.3 fL — ABNORMAL HIGH (ref 78.0–100.0)
MONO ABS: 0.3 10*3/uL (ref 0.1–1.0)
MONOS PCT: 2 %
Neutro Abs: 14.3 10*3/uL — ABNORMAL HIGH (ref 1.7–7.7)
Neutrophils Relative %: 94 %
PLATELETS: 229 10*3/uL (ref 150–400)
RBC: 3.01 MIL/uL — ABNORMAL LOW (ref 4.22–5.81)
RDW: 17.9 % — AB (ref 11.5–15.5)
WBC: 15.3 10*3/uL — ABNORMAL HIGH (ref 4.0–10.5)

## 2016-09-08 LAB — COMPREHENSIVE METABOLIC PANEL
ALBUMIN: 3 g/dL — AB (ref 3.5–5.0)
ALT: 7 U/L — ABNORMAL LOW (ref 17–63)
ANION GAP: 13 (ref 5–15)
AST: 12 U/L — AB (ref 15–41)
Alkaline Phosphatase: 58 U/L (ref 38–126)
BUN: 46 mg/dL — AB (ref 6–20)
CHLORIDE: 93 mmol/L — AB (ref 101–111)
CO2: 27 mmol/L (ref 22–32)
Calcium: 8.3 mg/dL — ABNORMAL LOW (ref 8.9–10.3)
Creatinine, Ser: 11.69 mg/dL — ABNORMAL HIGH (ref 0.61–1.24)
GFR calc Af Amer: 4 mL/min — ABNORMAL LOW (ref >60)
GFR calc non Af Amer: 4 mL/min — ABNORMAL LOW (ref >60)
GLUCOSE: 232 mg/dL — AB (ref 65–99)
POTASSIUM: 4.9 mmol/L (ref 3.5–5.1)
SODIUM: 133 mmol/L — AB (ref 135–145)
TOTAL PROTEIN: 6.8 g/dL (ref 6.5–8.1)
Total Bilirubin: 0.4 mg/dL (ref 0.3–1.2)

## 2016-09-08 LAB — RENAL FUNCTION PANEL
ALBUMIN: 3 g/dL — AB (ref 3.5–5.0)
Anion gap: 11 (ref 5–15)
BUN: 47 mg/dL — AB (ref 6–20)
CALCIUM: 8.4 mg/dL — AB (ref 8.9–10.3)
CO2: 27 mmol/L (ref 22–32)
CREATININE: 12.26 mg/dL — AB (ref 0.61–1.24)
Chloride: 97 mmol/L — ABNORMAL LOW (ref 101–111)
GFR, EST AFRICAN AMERICAN: 4 mL/min — AB (ref >60)
GFR, EST NON AFRICAN AMERICAN: 3 mL/min — AB (ref >60)
Glucose, Bld: 115 mg/dL — ABNORMAL HIGH (ref 65–99)
PHOSPHORUS: 3.7 mg/dL (ref 2.5–4.6)
Potassium: 5.6 mmol/L — ABNORMAL HIGH (ref 3.5–5.1)
SODIUM: 135 mmol/L (ref 135–145)

## 2016-09-08 LAB — GLUCOSE, CAPILLARY: Glucose-Capillary: 233 mg/dL — ABNORMAL HIGH (ref 65–99)

## 2016-09-08 LAB — TROPONIN I
Troponin I: 0.03 ng/mL (ref <0.03)
Troponin I: 0.03 ng/mL (ref <0.03)

## 2016-09-08 LAB — TSH: TSH: 2.487 u[IU]/mL (ref 0.350–4.500)

## 2016-09-08 MED ORDER — LIDOCAINE HCL (PF) 1 % IJ SOLN: 5.0000 mL | INTRAMUSCULAR | Status: DC | PRN

## 2016-09-08 MED ORDER — DOXAZOSIN MESYLATE 2 MG PO TABS
8.0000 mg | ORAL_TABLET | Freq: Every day | ORAL | Status: DC
  Administered 2016-09-08: 8 mg via ORAL
  Filled 2016-09-08: qty 4

## 2016-09-08 MED ORDER — HEPARIN SODIUM (PORCINE) 1000 UNIT/ML DIALYSIS: 20.0000 [IU]/kg | INTRAMUSCULAR | Status: DC | PRN

## 2016-09-08 MED ORDER — ONDANSETRON HCL 4 MG/2ML IJ SOLN: 4.0000 mg | Freq: Four times a day (QID) | INTRAMUSCULAR | Status: DC | PRN

## 2016-09-08 MED ORDER — CARBOXYMETHYLCELLULOSE SODIUM 1 % OP SOLN: 1.0000 [drp] | Freq: Every day | OPHTHALMIC | Status: DC

## 2016-09-08 MED ORDER — LIDOCAINE-PRILOCAINE 2.5-2.5 % EX CREA: 1.0000 "application " | TOPICAL_CREAM | CUTANEOUS | Status: DC | PRN

## 2016-09-08 MED ORDER — SODIUM CHLORIDE 0.9 % IV SOLN: 100.0000 mL | INTRAVENOUS | Status: DC | PRN

## 2016-09-08 MED ORDER — ACETAMINOPHEN 325 MG PO TABS: 650.0000 mg | ORAL_TABLET | Freq: Four times a day (QID) | ORAL | Status: DC | PRN

## 2016-09-08 MED ORDER — PSYLLIUM 95 % PO PACK
1.0000 | PACK | Freq: Every day | ORAL | Status: DC
  Administered 2016-09-09: 1 via ORAL
  Filled 2016-09-08 (×3): qty 1

## 2016-09-08 MED ORDER — NEPRO/CARBSTEADY PO LIQD: 237.0000 mL | Freq: Two times a day (BID) | ORAL | Status: DC

## 2016-09-08 MED ORDER — ALTEPLASE 2 MG IJ SOLR
2.0000 mg | Freq: Once | INTRAMUSCULAR | Status: DC | PRN
Start: 2016-09-08 — End: 2016-09-09

## 2016-09-08 MED ORDER — HEPARIN SODIUM (PORCINE) 1000 UNIT/ML DIALYSIS: 1000.0000 [IU] | INTRAMUSCULAR | Status: DC | PRN

## 2016-09-08 MED ORDER — LINACLOTIDE 145 MCG PO CAPS
145.0000 ug | ORAL_CAPSULE | Freq: Every day | ORAL | Status: DC
  Administered 2016-09-09: 145 ug via ORAL
  Filled 2016-09-08: qty 1

## 2016-09-08 MED ORDER — INSULIN ASPART 100 UNIT/ML ~~LOC~~ SOLN
0.0000 [IU] | Freq: Three times a day (TID) | SUBCUTANEOUS | Status: DC
  Administered 2016-09-09: 1 [IU] via SUBCUTANEOUS

## 2016-09-08 MED ORDER — FLUOROMETHOLONE 0.1 % OP SUSP
1.0000 [drp] | Freq: Every day | OPHTHALMIC | Status: DC
  Administered 2016-09-08: 1 [drp] via OPHTHALMIC
  Filled 2016-09-08: qty 5

## 2016-09-08 MED ORDER — PANTOPRAZOLE SODIUM 40 MG PO TBEC
40.0000 mg | DELAYED_RELEASE_TABLET | Freq: Every day | ORAL | Status: DC
  Administered 2016-09-09: 40 mg via ORAL
  Filled 2016-09-08: qty 1

## 2016-09-08 MED ORDER — BUPROPION HCL ER (XL) 150 MG PO TB24
150.0000 mg | ORAL_TABLET | Freq: Every day | ORAL | Status: DC
  Administered 2016-09-09: 150 mg via ORAL
  Filled 2016-09-08 (×3): qty 1

## 2016-09-08 MED ORDER — HEPARIN SODIUM (PORCINE) 1000 UNIT/ML IJ SOLN
INTRAMUSCULAR | Status: AC
  Administered 2016-09-08: 17:00:00
  Filled 2016-09-08: qty 2

## 2016-09-08 MED ORDER — ONDANSETRON HCL 4 MG PO TABS: 4.0000 mg | ORAL_TABLET | Freq: Four times a day (QID) | ORAL | Status: DC | PRN

## 2016-09-08 MED ORDER — POLYVINYL ALCOHOL 1.4 % OP SOLN
1.0000 [drp] | Freq: Every day | OPHTHALMIC | Status: DC
  Administered 2016-09-08: 1 [drp] via OPHTHALMIC
  Filled 2016-09-08: qty 15

## 2016-09-08 MED ORDER — ACETAMINOPHEN 650 MG RE SUPP: 650.0000 mg | Freq: Four times a day (QID) | RECTAL | Status: DC | PRN

## 2016-09-08 MED ORDER — CLONIDINE HCL 0.2 MG PO TABS
0.2000 mg | ORAL_TABLET | Freq: Three times a day (TID) | ORAL | Status: DC
  Administered 2016-09-08 – 2016-09-09 (×3): 0.2 mg via ORAL
  Filled 2016-09-08 (×3): qty 1

## 2016-09-08 MED ORDER — SEVELAMER CARBONATE 800 MG PO TABS
800.0000 mg | ORAL_TABLET | Freq: Three times a day (TID) | ORAL | Status: DC
  Administered 2016-09-08 – 2016-09-09 (×3): 1600 mg via ORAL
  Filled 2016-09-08 (×3): qty 2

## 2016-09-08 MED ORDER — CINACALCET HCL 30 MG PO TABS
30.0000 mg | ORAL_TABLET | Freq: Every day | ORAL | Status: DC
  Administered 2016-09-09: 30 mg via ORAL
  Filled 2016-09-08 (×3): qty 1

## 2016-09-08 MED ORDER — QUETIAPINE FUMARATE 100 MG PO TABS
200.0000 mg | ORAL_TABLET | Freq: Every day | ORAL | Status: DC
  Administered 2016-09-08: 200 mg via ORAL
  Filled 2016-09-08: qty 2

## 2016-09-08 MED ORDER — SIMVASTATIN 20 MG PO TABS
20.0000 mg | ORAL_TABLET | Freq: Every day | ORAL | Status: DC
  Administered 2016-09-08: 20 mg via ORAL
  Filled 2016-09-08: qty 1

## 2016-09-08 MED ORDER — DEXTROSE 50 % IV SOLN
INTRAVENOUS | Status: AC
  Administered 2016-09-08: 50 g
  Filled 2016-09-08: qty 100

## 2016-09-08 MED ORDER — INSULIN ASPART 100 UNIT/ML ~~LOC~~ SOLN: 0.0000 [IU] | Freq: Every day | SUBCUTANEOUS | Status: DC

## 2016-09-08 MED ORDER — PENTAFLUOROPROP-TETRAFLUOROETH EX AERO: 1.0000 "application " | INHALATION_SPRAY | CUTANEOUS | Status: DC | PRN

## 2016-09-08 NOTE — ED Notes (Signed)
Pt eating breakfast tray at this time

## 2016-09-08 NOTE — ED Notes (Signed)
Pt finished his breakfast and is now alert and oriented.  States he feels much better.

## 2016-09-08 NOTE — ED Triage Notes (Signed)
Patient brought in by son, states "my mom called me this morning saying my dad is confused and said he needed help getting to dialysis. He normally drives himself and walks but when I got there he is just out of it and confused, won't walk or talk much, and just weak." States last known well was 2130 last night. Patient's CBG is 12 at triage.

## 2016-09-08 NOTE — H&P (Addendum)
History and Physical    Gerald Davenport [id number] DOB: Aug 27, 1941 DOA: 09/08/2016  PCP: Donnie Coffin, MD  Patient coming from:Home  Chief Complaint: confusion  HPI: Gerald Davenport is a 75 y.o. male with medical history significant of hypertension, diabetes on insulin with peripheral vascular disease status post right below-knee amputation, ESRD on hemodialysis Monday Wednesday Friday, anemia of chronic kidney disease, history of infected dialysis graft currently has tunneled catheter brought in by patient's son for the evaluation of confusion. Usually patient gets up and drives to hemodialysis but today patient was found to be confused and disoriented and brought to the hospital for evaluation. In the ER patient was found to have blood sugar of 12 and hypothermic. Treated with dextrose with improvement in mental status. Patient was also found to have leukocytosis. During my examination, patient was alert awake and answering questions. He denied fever, chills, nausea, vomiting, headache, dizziness, chest pain, shortness of breath or abdomen pain. Patient's wife and sister at bedside. ED Course: Found to have hypoglycemia, hypothermic and leukocytosis. Treated with dextrose. Blood cultures were sent and admitted for further evaluation.  Review of Systems: As per HPI otherwise 10 point review of systems negative.    Past Medical History:  Diagnosis Date  . Anemia   . Anxiety   . Cancer [facility])    Prostate cancer treated with radiation therapy  . Chronic kidney disease    dialysis Mon, Wednes, Fri  . Clot 05/2006   Arterial clot right leg   . Constipation   . Diabetes mellitus    Type II  . Diverticulosis   . GERD (gastroesophageal reflux disease)   . Hemodialysis patient ([place]) 6/13  . Hyperlipidemia   . Hypertension   . Infection    drainage from distal incision of left upper arm arteriovenous graft  . Positive PPD   . Shortness of breath dyspnea    with exertion, when he has too much  fluid  . Steal syndrome dialysis vascular access ([place])   . Thyroid disease    hyperparathyroidism  . Wears glasses     Past Surgical History:  Procedure Laterality Date  . AV FISTULA PLACEMENT  11/04/2010   left and left  . AV FISTULA PLACEMENT  11/20/2011   Procedure: INSERTION OF ARTERIOVENOUS (AV) GORE-TEX GRAFT ARM;  Surgeon: Angelia Mould, MD;  Location: [place];  Service: Vascular;  Laterality: Right;  . AV FISTULA PLACEMENT Right 04/19/2015   Procedure: Excision of infected RUA AVG;  Surgeon: Conrad Gila, MD;  Location: [place];  Service: Vascular;  Laterality: Right;  . AV FISTULA PLACEMENT Left 10/04/2015   Procedure: INSERTION OF ARTERIOVENOUS (AV) GORE-TEX GRAFT ARM;  Surgeon: Conrad Bienville, MD;  Location: [place];  Service: Vascular;  Laterality: Left;  . [place] REMOVAL Left 10/13/2015   Procedure: REMOVAL OF INFECTED ARTERIOVENOUS GORETEX LEFT GRAFT ;  Surgeon: Conrad Guayama, MD;  Location: [place];  Service: Vascular;  Laterality: Left;  . BASCILIC VEIN TRANSPOSITION Left 06/19/2015   Procedure: FIRST STAGE BRACHIAL VEIN TRANSPOSITION;  Surgeon: Conrad , MD;  Location: [place];  Service: Vascular;  Laterality: Left;  . 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 COLONOSCOPY N/A 11/10/2012   [id number] proctitis/Pancolonic diverticulosis/(TUBULAR ADENOMA)5 mm polyp in the transverse segment/4 mm polyps in the base of the cecum and diminutive polyp in the descending segment. INADEQUATE BOWEL PREP  . COLONOSCOPY N/A 11/28/2013   RMR: Radiation proctitis. Colonic diverticulosis. Colonic  polyps-removed as described above.   . INSERTION OF DIALYSIS CATHETER Right 01/19/2016   Procedure: INSERTION OF Right internal jugular  DIALYSIS CATHETER.;  Surgeon: Elam Dutch, MD;  Location: [place];  Service: Vascular;  Laterality: Right;  . PERIPHERAL VASCULAR CATHETERIZATION Bilateral 12/06/2015   Procedure: Upper Extremity Venography/ central venogram;  Surgeon: Conrad [place]burg, MD;   Location: [place] CV LAB;  Service: Cardiovascular;  Laterality: Bilateral;  . PERIPHERAL VASCULAR CATHETERIZATION N/A 05/29/2016   Procedure: Upper Extremity Angiography;  Surgeon: Conrad Ducor, MD;  Location: [place] CV LAB;  Service: Cardiovascular;  Laterality: N/A;  . PERIPHERAL VASCULAR CATHETERIZATION N/A 05/29/2016   Procedure: Lower Extremity Angiography;  Surgeon: Conrad [place], MD;  Location: [place] CV LAB;  Service: Cardiovascular;  Laterality: N/A;  . PERIPHERAL VASCULAR CATHETERIZATION  05/29/2016   Procedure: Peripheral Vascular Intervention;  Surgeon: Conrad Pasco, MD;  Location: [place] CV LAB;  Service: Cardiovascular;;  . REMOVAL OF A HERO DEVICE Right 01/19/2016   Procedure: REMOVAL OF A HERO DEVICE;  Surgeon: Elam Dutch, MD;  Location: [place];  Service: Vascular;  Laterality: Right;  . SHUNTOGRAM N/A 10/08/2012   Procedure: [name];  Surgeon: Elam Dutch, MD;  Location: [facility] CATH LAB;  Service: Cardiovascular;  Laterality: N/A;  . VASCULAR ACCESS DEVICE INSERTION Right 01/01/2016   Procedure: INSERTION OF HERO VASCULAR ACCESS DEVICE - RIGHT;  Surgeon: Conrad [place], MD;  Location: [place];  Service: Vascular;  Laterality: Right;     Social history: reports quitting smoking.  He has a 12.50 pack-year smoking history. He has never used smokeless tobacco. He reports that he does not drink alcohol or use drugs.  No Known Allergies  Family History  Problem Relation Age of Onset  . Heart disease Mother   . Diabetes Mother   . Hypertension Mother   . Stroke Mother   . Heart disease Father   . Diabetes Father   . Heart disease Sister   . Diabetes Brother   . Colon cancer Neg Hx      Prior to Admission medications   Medication Sig Start Date End Date Taking? Authorizing Provider  aspirin EC 81 MG tablet Take 81-162 mg by mouth See admin instructions. Take 2 tablets (162 mg) by mouth every morning and 1 tablet (81 mg) at night    Yes Historical Provider,  MD  buPROPion (WELLBUTRIN XL) 150 MG 24 hr tablet Take 150 mg by mouth daily. 03/05/15  Yes Historical Provider, MD  calcium acetate (PHOSLO) 667 MG capsule Take 667-2,001 mg by mouth See admin instructions. Take 2001 mg by mouth 3 times daily. Take 667 mg by mouth with snacks. 10/26/12  Yes Historical Provider, MD  cloNIDine (CATAPRES) 0.2 MG tablet Take 1 tablet (0.2 mg total) by mouth 3 (three) times daily. 01/19/16  Yes [name], MD  doxazosin (CARDURA) 8 MG tablet Take 8 mg by mouth daily. Reported on 02/26/2016   Yes Historical Provider, MD  fluorometholone (FML) 0.1 % ophthalmic suspension Place 1 drop into both eyes at bedtime. 04/15/16  Yes Historical Provider, MD  furosemide (LASIX) 40 MG tablet Take 40 mg by mouth daily.   Yes Historical Provider, MD  gabapentin (NEURONTIN) 100 MG capsule Take 100 mg by mouth 2 (two) times daily. 04/13/15  Yes Historical Provider, MD  insulin detemir (LEVEMIR) 100 UNIT/ML injection Inject 25 Units into the skin at bedtime.    Yes Historical Provider, MD  Linaclotide Rolan Lipa) [phone]  MCG CAPS capsule Take 1 capsule (145 mcg total) by mouth daily. Take 30 minutes before breakfast 02/06/15  Yes Carlis Stable, NP  omeprazole (PRILOSEC) 20 MG capsule Take 20 mg by mouth 2 (two) times daily before a meal.  02/20/15  Yes Historical Provider, MD  Polyvinyl Alcohol-Povidone (REFRESH OP) Place 1 drop into both eyes at bedtime.   Yes Historical Provider, MD  psyllium (METAMUCIL) 58.6 % powder Take 1 packet by mouth as needed (for stool).    Yes Historical Provider, MD  QUEtiapine (SEROQUEL) 100 MG tablet Take 200 mg by mouth at bedtime.  03/05/15  Yes Historical Provider, MD  saxagliptin HCl (ONGLYZA) 5 MG TABS tablet Take 5 mg by mouth daily with breakfast.    Yes Historical Provider, MD  SENSIPAR 30 MG tablet Take 30 mg by mouth daily.  11/20/15  Yes Historical Provider, MD  sevelamer carbonate (RENVELA) 800 MG tablet Take 800-1,600 mg by mouth 3 (three) times daily with meals.    Yes Historical Provider, MD  simvastatin (ZOCOR) 20 MG tablet Take 20 mg by mouth daily.   Yes Historical Provider, MD  Vancomycin (VANCOCIN) 750 MG/150ML SOLN Inject 150 mLs (750 mg total) into the vein every Monday, Wednesday, and Friday with hemodialysis. 10/17/15  Yes Kimberly A Trinh, PA-C  acetaminophen-codeine (TYLENOL #3) 300-30 MG tablet Take 1 tablet by mouth every 4 (four) hours as needed for moderate pain. Patient not taking: Reported on 09/08/2016 01/19/16   Elam Dutch, MD    Physical Exam: Vitals:   09/08/16 0830 09/08/16 0900 09/08/16 0919 09/08/16 0930  BP: 162/65 161/69 161/69 167/74  Pulse: 73 73 76 75  Resp: 12 14 19 13   Temp:   97.4 F (36.3 C)   TempSrc:   Rectal   SpO2: 97% 97% 98% 97%  Weight:      Height:          Constitutional: NAD, calm, comfortable With intermittent somnolent Vitals:   09/08/16 0830 09/08/16 0900 09/08/16 0919 09/08/16 0930  BP: 162/65 161/69 161/69 167/74  Pulse: 73 73 76 75  Resp: 12 14 19 13   Temp:   97.4 F (36.3 C)   TempSrc:   Rectal   SpO2: 97% 97% 98% 97%  Weight:      Height:       Eyes: PERRL, lids and conjunctivae normal ENMT: Mucous membranes are moist. Posterior pharynx clear of any exudate or lesions.  Neck: normal, supple, no masses, no thyromegaly Respiratory: Bibasal decreased breath sound, no wheezing. Normal respiratory effort. No accessory muscle use.  Cardiovascular: Regular rate and rhythm, no murmurs / rubs / gallops. No extremity edema. Abdomen: no tenderness, no masses palpated. No hepatosplenomegaly. Bowel sounds positive.  Musculoskeletal: no clubbing / cyanosis. Right below knee amputation  Skin: no rashes, lesions, ulcers. No induration Neurologic: CN 2-12 grossly intact. Sensation intact, Strength 5/5 in all extremities. Psychiatric: Alert awake and oriented 3. Slow to respond.    Labs on Admission: I have personally reviewed following labs and imaging studies  CBC:  Recent Labs Lab  09/08/16 0825  WBC 15.3*  NEUTROABS 14.3*  HGB 10.7*  HCT 32.6*  MCV 108.3*  PLT [id number]   Basic Metabolic Panel:  Recent Labs Lab 09/08/16 0825  NA 133*  K 4.9  CL 93*  CO2 27  GLUCOSE 232*  BUN 46*  CREATININE 11.69*  CALCIUM 8.3*   GFR: Estimated Creatinine Clearance: 5.1 mL/min (by C-G formula based on SCr of 11.69 mg/dL (  H)). Liver Function Tests:  Recent Labs Lab 09/08/16 0825  AST 12*  ALT 7*  ALKPHOS 58  BILITOT 0.4  PROT 6.8  ALBUMIN 3.0*   No results for input(s): LIPASE, AMYLASE in the last 168 hours. No results for input(s): AMMONIA in the last 168 hours. Coagulation Profile: No results for input(s): INR, PROTIME in the last 168 hours. Cardiac Enzymes:  Recent Labs Lab 09/08/16 0825  TROPONINI 0.03*   BNP (last 3 results) No results for input(s): PROBNP in the last 8760 hours. HbA1C: No results for input(s): HGBA1C in the last 72 hours. CBG:  Recent Labs Lab 09/08/16 0712 09/08/16 0724 09/08/16 0753 09/08/16 0922  GLUCAP 12* 226* 257* 172*   Lipid Profile: No results for input(s): CHOL, HDL, LDLCALC, TRIG, CHOLHDL, LDLDIRECT in the last 72 hours. Thyroid Function Tests: No results for input(s): TSH, T4TOTAL, FREET4, T3FREE, THYROIDAB in the last 72 hours. Anemia Panel: No results for input(s): VITAMINB12, FOLATE, FERRITIN, TIBC, IRON, RETICCTPCT in the last 72 hours. Urine analysis: No results found for: COLORURINE, APPEARANCEUR, LABSPEC, [place], GLUCOSEU, [place], BILIRUBINUR, [place], [place], UROBILINOGEN, NITRITE, LEUKOCYTESUR Sepsis Labs: !!!!!!!!!!!!!!!!!!!!!!!!!!!!!!!!!!!!!!!!!!!! @LABRCNTIP (procalcitonin:4,lacticidven:4) ) Recent Results (from the past 240 hour(s))  Blood culture (routine x 2)     Status: None (Preliminary result)   Collection Time: 09/08/16  8:26 AM  Result Value Ref Range Status   Specimen Description BLOOD BLOOD LEFT FOREARM  Final   Special Requests BOTTLES DRAWN AEROBIC AND ANAEROBIC [place]  Final    Culture PENDING  Incomplete   Report Status PENDING  Incomplete  Blood culture (routine x 2)     Status: None (Preliminary result)   Collection Time: 09/08/16  8:33 AM  Result Value Ref Range Status   Specimen Description BLOOD BLOOD LEFT HAND  Final   Special Requests BOTTLES DRAWN AEROBIC ONLY 4CC  Final   Culture PENDING  Incomplete   Report Status PENDING  Incomplete     Radiological Exams on Admission: Dg Chest Port 1 View  Result Date: 09/08/2016 CLINICAL DATA:  Altered mental status today. History of dialysis dependent renal failure, diabetes, hypertension, current smoker. Prostate malignancy treated with radiation therapy. EXAM: PORTABLE CHEST 1 VIEW COMPARISON:  PA and lateral chest x-ray of June 27, 2016 FINDINGS: The lungs are less well inflated today. The interstitial markings are increased. There is no alveolar infiltrate or pleural effusion. The cardiac silhouette is top-normal in size. The central pulmonary vascularity is prominent and there is mild cephalization. The dialysis catheter tip projects at the cavoatrial junction. There is calcification in the wall of the aortic arch. The bony thorax exhibits no acute abnormality. IMPRESSION: Mild pulmonary interstitial edema likely of cardiac cause. No alveolar pneumonia or significant pleural effusion. Electronically Signed   By: David  Martinique M.D.   On: 09/08/2016 07:52    EKG: Independently reviewed. Sinus rhythm and nonspecific T-wave changes.  Assessment/Plan  # Acute encephalopathy likely due to hypoglycemia: -Patient was treated with dextrose with improvement in mental status. He was somnolent and slow to respond during my examination in the ER. I will check CT scan of head to rule out any intracranial pathology. He does not have focal neurological deficit this time. -Continue to monitor mental status, PT OT evaluation.  Addendum: 11:30 am: Received call from radiology regarding abnormal CT head findings. CT head  showed 1 cm subacute subdural hemorrhage with a small acute appearing component on the left without midline shift. I informed both finding to patient's son. Patient's wife  did not answer the phone. I consulted neurosurgeon, waiting to receive call back from Dr Annette Stable. Discontinued aspirin and heparin subcutaneous. I also called and discussed with Dr.Befekadu regadring the abnormal CT scan finding. I will wait to to talk to neurosurgeon whether to keep the patient here or to transfer to [facility] for consultants evaluation. Also discussed with ER physician Dr. Lacinda Axon.  Addendum: 11:37 Am: Received call from Dr. Annette Stable who reviewed the CT scan result and had discussion about the patient's presentation. He reported that the CT scan finding likely chronic in nature. No urgent need of procedure or surgery. He recommended to treat medically, discontinue aspirin or any anticoagulation and outpatient follow-up.  #Insulin-dependent diabetes with peripheral neuropathy, complicated by hypoglycemia: -Patient admitted with blood sugar level of 12. She was also hypothermic. Patient reported taking his usual insulin, Levemir 25 units yesterday. Didn't eat much during dinner. Unknown if not eating caused hypoglycemia versus any underlying condition including infection. Patient has tunneled catheter, site looks clean. Cultures were sent. -We will monitor blood sugar level, encourage oral intake. Hold long-acting insulin. Continue sliding scale as needed. -Check A1c level. -Diabetic educator referral.  # Hypothermia: improved with bear hugger. Follow up culture result.  # ESRD on Hd: Monday Wednesday Friday. Nephrology consulted and discussed with Befekadu. -The catheter site does not look infected. Scheduled maintenance hemodialysis today as per nephrologist.  #Leukocytosis: Unknown if it is related with infection versus stress related. Chest x-ray without pneumonia, has some congestion. Patient is afebrile. Cultures were  sent in ER. I will hold off on antibiotics until the test result.   # Essential hypertension, benign: Resume home medicine. Monitor blood pressure.  #Anemia of chronic kidney disease: Hemoglobin level acceptable. No sign of bleeding. Continue to monitor.  #Mild elevation in troponin level likely due to ESRD. He has no chest pain. DVT prophylaxis: Heparin subcutaneous Code Status: Full code Family Communication: Discussed with the patient's wife and sister at bedside in the ER Disposition Plan: Likely discharge home in 1-2 days. Consults called: Nephrologist Admission status: Observation   Meisha Salone Tanna Furry MD [facility] Pager [phone]  If 7PM-7AM, please contact night-coverage [email or web address] Password [email or web address]  09/08/2016, 10:28 AM

## 2016-09-08 NOTE — ED Notes (Signed)
Unable to obtain oral temp at this time.  Pt requests not to have another rectal temp done yet.

## 2016-09-08 NOTE — ED Notes (Signed)
Pt responding to voice.  Able to state name.  Additional 50 grams of Dextrose given.

## 2016-09-08 NOTE — ED Provider Notes (Signed)
[name] Provider Note   CSN: [id number] Arrival date & time: 09/08/16  0701  By signing my name below, I, Gerald Davenport, attest that this documentation has been prepared under the direction and in the presence of Gerald Christen, MD. Electronically Signed: Rayna Davenport, ED Scribe. 09/08/16. 8:35 AM.   History   Chief Complaint Chief Complaint  Patient presents with  . Altered Mental Status    LEVEL FIVE CAVEAT: CONFUSION AND DISORIENTATION   HPI HPI Comments: AARION Davenport is a 75 y.o. male who presents to the Emergency Department due to AMS onset this morning. 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 At baseline pt typically bathes and dresses himself prior to driving himself to dialysis. This morning pt woke confused, disorientated and was unable to perform his nml daily activities. His CBG was initially 12 and his son states that his skin felt cold and clammy. No other information obtained due to pt's condition.   The history is provided by medical records and a relative. The history is limited by the condition of the patient. No language interpreter was used.    Past Medical History:  Diagnosis Date  . Anemia   . Anxiety   . Cancer [facility])    Prostate cancer treated with radiation therapy  . Chronic kidney disease    dialysis Mon, Wednes, Fri  . Clot 05/2006   Arterial clot right leg   . Constipation   . Diabetes mellitus    Type II  . Diverticulosis   . GERD (gastroesophageal reflux disease)   . Hemodialysis patient ([place]) 6/13  . Hyperlipidemia   . Hypertension   . Infection    drainage from distal incision of left upper arm arteriovenous graft  . Positive PPD   . Shortness of breath dyspnea    with exertion, when he has too much fluid  . 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 Thyroid disease    hyperparathyroidism  . Wears glasses     Patient Active Problem List   Diagnosis Date Noted  . Atherosclerosis of native arteries  of extremity with intermittent claudication ([place]) 05/23/2016  . 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 Infection 10/13/2015  . Infection of arteriovenous graft for hemodialysis ([place]) 10/12/2015  . ESRD on dialysis ([place]) 06/15/2015  . Diabetic neuropathy associated with type 2 diabetes mellitus ([place]) 05/25/2015  . Infection, dialysis vascular access ([place]) 04/19/2015  . Rectal bleeding 02/06/2015  . SOB (shortness of breath) 03/24/2014  . Abnormal EKG 03/24/2014  . Essential hypertension, benign 03/15/2014  . Mixed hyperlipidemia 03/15/2014  . H/O adenomatous polyp of colon 11/07/2013  . Constipation 06/15/2013  . Encounter for screening colonoscopy 11/02/2012  . Fecal incontinence 09/10/2012  . GERD (gastroesophageal reflux disease) 09/10/2012  . Diabetes mellitus 08/06/2011  . CRF (chronic renal failure) 08/06/2011    Past Surgical History:  Procedure Laterality Date  . AV FISTULA PLACEMENT  11/04/2010   left and left  . AV FISTULA PLACEMENT  11/20/2011   Procedure: INSERTION OF ARTERIOVENOUS (AV) GORE-TEX GRAFT ARM;  Surgeon: Angelia Mould, MD;  Location: [facility];  Service: Vascular;  Laterality: Right;  . AV FISTULA PLACEMENT Right 04/19/2015   Procedure: Excision of infected RUA AVG;  Surgeon: Conrad [place], MD;  Location: [facility];  Service: Vascular;  Laterality: Right;  . AV FISTULA PLACEMENT Left 10/04/2015   Procedure: INSERTION OF ARTERIOVENOUS (AV) GORE-TEX GRAFT ARM;  Surgeon:  Conrad [place], MD;  Location: [facility];  Service: Vascular;  Laterality: Left;  . [place] REMOVAL Left 10/13/2015   Procedure: REMOVAL OF INFECTED ARTERIOVENOUS GORETEX LEFT GRAFT ;  Surgeon: Conrad Avon, MD;  Location: [facility];  Service: Vascular;  Laterality: Left;  . BASCILIC VEIN TRANSPOSITION Left 06/19/2015   Procedure: FIRST STAGE BRACHIAL VEIN TRANSPOSITION;  Surgeon: Conrad [place], MD;  Location: [facility];  Service: Vascular;  Laterality: Left;  . 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 COLONOSCOPY N/A 11/10/2012   [id number] proctitis/Pancolonic diverticulosis/(TUBULAR ADENOMA)5 mm polyp in the transverse segment/4 mm polyps in the base of the cecum and diminutive polyp in the descending segment. INADEQUATE BOWEL PREP  . COLONOSCOPY N/A 11/28/2013   RMR: Radiation proctitis. Colonic diverticulosis. Colonic polyps-removed as described above.   . INSERTION OF DIALYSIS CATHETER Right 01/19/2016   Procedure: INSERTION OF Right internal jugular  DIALYSIS CATHETER.;  Surgeon: Elam Dutch, MD;  Location: [facility];  Service: Vascular;  Laterality: Right;  . PERIPHERAL VASCULAR CATHETERIZATION Bilateral 12/06/2015   Procedure: Upper Extremity Venography/ central venogram;  Surgeon: Conrad Chapman, MD;  Location: [place] CV LAB;  Service: Cardiovascular;  Laterality: Bilateral;  . PERIPHERAL VASCULAR CATHETERIZATION N/A 05/29/2016   Procedure: Upper Extremity Angiography;  Surgeon: Conrad [place], MD;  Location: [place] CV LAB;  Service: Cardiovascular;  Laterality: N/A;  . PERIPHERAL VASCULAR CATHETERIZATION N/A 05/29/2016   Procedure: Lower Extremity Angiography;  Surgeon: Conrad [place], MD;  Location: [place] CV LAB;  Service: Cardiovascular;  Laterality: N/A;  . PERIPHERAL VASCULAR CATHETERIZATION  05/29/2016   Procedure: Peripheral Vascular Intervention;  Surgeon: Conrad Burnet, MD;  Location: [place] CV LAB;  Service: Cardiovascular;;  . REMOVAL OF A HERO DEVICE Right 01/19/2016   Procedure: REMOVAL OF A HERO DEVICE;  Surgeon: Elam Dutch, MD;  Location: [place] [facility];  Service: Vascular;  Laterality: Right;  . SHUNTOGRAM N/A 10/08/2012   Procedure: [name];  Surgeon: Elam Dutch, MD;  Location: [facility] CATH LAB;  Service: Cardiovascular;  Laterality: N/A;  . VASCULAR ACCESS DEVICE INSERTION Right 01/01/2016   Procedure: INSERTION OF HERO VASCULAR ACCESS DEVICE - RIGHT;  Surgeon: Conrad [place], MD;  Location: [facility];  Service: Vascular;  Laterality: Right;        Home Medications    Prior to Admission medications   Medication Sig Start Date End Date Taking? Authorizing Provider  aspirin EC 81 MG tablet Take 81-162 mg by mouth See admin instructions. Take 2 tablets (162 mg) by mouth every morning and 1 tablet (81 mg) at night    Yes Historical Provider, MD  buPROPion (WELLBUTRIN XL) 150 MG 24 hr tablet Take 150 mg by mouth daily. 03/05/15  Yes Historical Provider, MD  calcium acetate (PHOSLO) 667 MG capsule Take 667-2,001 mg by mouth See admin instructions. Take 2001 mg by mouth 3 times daily. Take 667 mg by mouth with snacks. 10/26/12  Yes Historical Provider, MD  cloNIDine (CATAPRES) 0.2 MG tablet Take 1 tablet (0.2 mg total) by mouth 3 (three) times daily. 01/19/16  Yes Gerald Christen, MD  doxazosin (CARDURA) 8 MG tablet Take 8 mg by mouth daily. Reported on 02/26/2016   Yes Historical Provider, MD  fluorometholone (FML) 0.1 % ophthalmic suspension Place 1 drop into both eyes at bedtime. 04/15/16  Yes Historical Provider, MD  furosemide (LASIX) 40 MG tablet Take 40 mg by mouth daily.   Yes Historical Provider, MD  gabapentin (NEURONTIN) 100 MG capsule Take 100 mg by mouth 2 (two) times daily. 04/13/15  Yes Historical Provider, MD  insulin detemir (LEVEMIR) 100 UNIT/ML injection Inject 25 Units into the skin at bedtime.    Yes Historical Provider, MD  Linaclotide Rolan Lipa) 145 MCG CAPS capsule Take 1 capsule (145 mcg total) by mouth daily. Take 30 minutes before breakfast 02/06/15  Yes Carlis Stable, NP  omeprazole (PRILOSEC) 20 MG capsule Take 20 mg by mouth 2 (two) times daily before a meal.  02/20/15  Yes Historical Provider, MD  Polyvinyl Alcohol-Povidone (REFRESH OP) Place 1 drop into both eyes at bedtime.   Yes Historical Provider, MD  psyllium (METAMUCIL) 58.6 % powder Take 1 packet by mouth as needed (for stool).    Yes Historical Provider, MD  QUEtiapine (SEROQUEL) 100 MG tablet Take 200 mg by mouth at bedtime.  03/05/15  Yes Historical Provider, MD   saxagliptin HCl (ONGLYZA) 5 MG TABS tablet Take 5 mg by mouth daily with breakfast.    Yes Historical Provider, MD  SENSIPAR 30 MG tablet Take 30 mg by mouth daily.  11/20/15  Yes Historical Provider, MD  sevelamer carbonate (RENVELA) 800 MG tablet Take 800-1,600 mg by mouth 3 (three) times daily with meals.   Yes Historical Provider, MD  simvastatin (ZOCOR) 20 MG tablet Take 20 mg by mouth daily.   Yes Historical Provider, MD  Vancomycin (VANCOCIN) 750 MG/150ML SOLN Inject 150 mLs (750 mg total) into the vein every Monday, Wednesday, and Friday with hemodialysis. 10/17/15  Yes Kimberly A Trinh, PA-C  acetaminophen-codeine (TYLENOL #3) 300-30 MG tablet Take 1 tablet by mouth every 4 (four) hours as needed for moderate pain. Patient not taking: Reported on 09/08/2016 01/19/16   Elam Dutch, MD    Family History Family History  Problem Relation Age of Onset  . Heart disease Mother   . Diabetes Mother   . Hypertension Mother   . Stroke Mother   . Heart disease Father   . Diabetes Father   . Heart disease Sister   . Diabetes Brother   . Colon cancer Neg Hx     Social History Social History  Substance Use Topics  . Smoking status: Light Tobacco Smoker    Packs/day: 0.25    Years: 50.00    Types: Cigarettes  . Smokeless tobacco: Never Used     Comment: 4-5 cigarettes daily  . Alcohol use No     Allergies   Patient has no known allergies.   Review of Systems Review of Systems  Unable to perform ROS: Mental status change   Physical Exam Updated Vital Signs BP 167/74   Pulse 75   Temp 97.4 F (36.3 C) (Rectal)   Resp 13   Ht 6' (1.829 m)   Wt 150 lb (68 kg)   SpO2 97%   BMI 20.34 kg/m   Physical Exam  Constitutional: He appears well-developed and well-nourished.  HENT:  Head: Normocephalic and atraumatic.  Eyes: Conjunctivae are normal.  Neck: Neck supple.  Cardiovascular: Normal rate and regular rhythm.   Pulmonary/Chest: Effort normal and breath sounds  normal.  Abdominal: Soft. Bowel sounds are normal.  Musculoskeletal: Normal range of motion.  Neurological: He is alert.  Skin: Skin is warm and dry.  Nursing note and vitals reviewed.  ED Treatments / Results  Labs (all labs ordered are listed, but only abnormal results are displayed) Labs Reviewed  CBC WITH DIFFERENTIAL/PLATELET - Abnormal; Notable for the following:  Result Value   WBC 15.3 (*)    RBC 3.01 (*)    Hemoglobin 10.7 (*)    HCT 32.6 (*)    MCV 108.3 (*)    MCH 35.5 (*)    RDW 17.9 (*)    Neutro Abs 14.3 (*)    All other components within normal limits  COMPREHENSIVE METABOLIC PANEL - Abnormal; Notable for the following:    Sodium 133 (*)    Chloride 93 (*)    Glucose, Bld 232 (*)    BUN 46 (*)    Creatinine, Ser 11.69 (*)    Calcium 8.3 (*)    Albumin 3.0 (*)    AST 12 (*)    ALT 7 (*)    GFR calc non Af Amer 4 (*)    GFR calc Af Amer 4 (*)    All other components within normal limits  TROPONIN I - Abnormal; Notable for the following:    Troponin I 0.03 (*)    All other components within normal limits  CBG MONITORING, ED - Abnormal; Notable for the following:    Glucose-Capillary 12 (*)    All other components within normal limits  CBG MONITORING, ED - Abnormal; Notable for the following:    Glucose-Capillary 226 (*)    All other components within normal limits  CBG MONITORING, ED - Abnormal; Notable for the following:    Glucose-Capillary 172 (*)    All other components within normal limits  CBG MONITORING, ED - Abnormal; Notable for the following:    Glucose-Capillary 257 (*)    All other components within normal limits  CULTURE, BLOOD (ROUTINE X 2)  CULTURE, BLOOD (ROUTINE X 2)    EKG  EKG Interpretation  Date/Time:  Monday September 08 2016 07:14:04 EST Ventricular Rate:  85 PR Interval:    QRS Duration: 88 QT Interval:  408 QTC Calculation: 486 R Axis:   65 Text Interpretation:  Sinus rhythm Nonspecific T abnormalities, lateral  leads Borderline prolonged QT interval Confirmed by Lacinda Axon  MD, Kimberlynn Lumbra ([id number]) on 09/08/2016 7:34:48 AM       Radiology Dg Chest Port 1 View  Result Date: 09/08/2016 CLINICAL DATA:  Altered mental status today. History of dialysis dependent renal failure, diabetes, hypertension, current smoker. Prostate malignancy treated with radiation therapy. EXAM: PORTABLE CHEST 1 VIEW COMPARISON:  PA and lateral chest x-ray of June 27, 2016 FINDINGS: The lungs are less well inflated today. The interstitial markings are increased. There is no alveolar infiltrate or pleural effusion. The cardiac silhouette is top-normal in size. The central pulmonary vascularity is prominent and there is mild cephalization. The dialysis catheter tip projects at the cavoatrial junction. There is calcification in the wall of the aortic arch. The bony thorax exhibits no acute abnormality. IMPRESSION: Mild pulmonary interstitial edema likely of cardiac cause. No alveolar pneumonia or significant pleural effusion. Electronically Signed   By: David  Martinique M.D.   On: 09/08/2016 07:52    Procedures Procedures  DIAGNOSTIC STUDIES: Oxygen Saturation is 100% on RA, normal by my interpretation.    COORDINATION OF CARE: 8:35 AM Discussed next steps with his son. Will administer IV glucose, obtain blood cultures and initiate bear hugger. He verbalized understanding and is agreeable with the plan.    Medications Ordered in ED Medications  dextrose 50 % solution (50 g  Given 09/08/16 0717)     Initial Impression / Assessment and Plan / ED Course  I have reviewed the triage vital signs and the  nursing notes.  Pertinent labs & imaging results that were available during my care of the patient were reviewed by me and considered in my medical decision making (see chart for details).  Clinical Course    CRITICAL CARE Performed by: Gerald Davenport Total critical care time: 30 minutes Critical care time was exclusive of separately  billable procedures and treating other patients. Critical care was necessary to treat or prevent imminent or life-threatening deterioration. Critical care was time spent personally by me on the following activities: development of treatment plan with patient and/or surrogate as well as nursing, discussions with consultants, evaluation of patient's response to treatment, examination of patient, obtaining history from patient or surrogate, ordering and performing treatments and interventions, ordering and review of laboratory studies, ordering and review of radiographic studies, pulse oximetry and re-evaluation of patient's condition.   Patient presents with profound hypoglycemia and hypothermia. He has improved dramatically with intravenous glucose and bear hugger.  Blood pressure and pulse have remained stable. Past medical history includes end-stage renal disease. He will need dialysis  I personally performed the services described in this documentation, which was scribed in my presence. The recorded information has been reviewed and is accurate.   Final Clinical Impressions(s) / ED Diagnoses   Final diagnoses:  Hypoglycemia  Hypothermia, initial encounter  Confusion    New Prescriptions New Prescriptions   No medications on file     Gerald Christen, MD 09/08/16 1013

## 2016-09-08 NOTE — ED Notes (Signed)
Dr Lacinda Axon at bedside. 50 grams of dextrose given via IV. Patient more alert but still confused at this time.

## 2016-09-08 NOTE — Consult Note (Signed)
Reason for Consult:end-stage renal disease Referring Physician: Dr. [name] is an 75 y.o. male.  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 When he is evaluated patient was found to have hypoglycemia. Presently patient states that she's feeling much better. He denies any fever but he has some chills. Denies also any cough. His appetite is poor but she doesn't have any nausea or vomiting.  Past Medical History:  Diagnosis Date  . Anemia   . Anxiety   . Cancer [facility])    Prostate cancer treated with radiation therapy  . Chronic kidney disease    dialysis Mon, Wednes, Fri  . Clot 05/2006   Arterial clot right leg   . Constipation   . Diabetes mellitus    Type II  . Diverticulosis   . GERD (gastroesophageal reflux disease)   . Hemodialysis patient ([place]) 6/13  . Hyperlipidemia   . Hypertension   . Infection    drainage from distal incision of left upper arm arteriovenous graft  . Positive PPD   . Shortness of breath dyspnea    with exertion, when he has too much fluid  . Steal syndrome dialysis vascular access ([place])   . Thyroid disease    hyperparathyroidism  . Wears glasses     Past Surgical History:  Procedure Laterality Date  . AV FISTULA PLACEMENT  11/04/2010   left and left  . AV FISTULA PLACEMENT  11/20/2011   Procedure: INSERTION OF ARTERIOVENOUS (AV) GORE-TEX GRAFT ARM;  Surgeon: Angelia Mould, MD;  Location: [place];  Service: Vascular;  Laterality: Right;  . AV FISTULA PLACEMENT Right 04/19/2015   Procedure: Excision of infected RUA AVG;  Surgeon: Conrad Malone, MD;  Location: [place];  Service: Vascular;  Laterality: Right;  . AV FISTULA PLACEMENT Left 10/04/2015   Procedure: INSERTION OF ARTERIOVENOUS (AV) GORE-TEX GRAFT ARM;  Surgeon: Conrad Clearfield, MD;  Location: [place];  Service: Vascular;  Laterality: Left;  .  [place] REMOVAL Left 10/13/2015   Procedure: REMOVAL OF INFECTED ARTERIOVENOUS GORETEX LEFT GRAFT ;  Surgeon: Conrad Ten Broeck, MD;  Location: [place];  Service: Vascular;  Laterality: Left;  . BASCILIC VEIN TRANSPOSITION Left 06/19/2015   Procedure: FIRST STAGE BRACHIAL VEIN TRANSPOSITION;  Surgeon: Conrad Eden, MD;  Location: [place];  Service: Vascular;  Laterality: Left;  . 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 COLONOSCOPY N/A 11/10/2012   FMB:[id number] proctitis/Pancolonic diverticulosis/(TUBULAR ADENOMA)5 mm polyp in the transverse segment/4 mm polyps in the base of the cecum and diminutive polyp in the descending segment. INADEQUATE BOWEL PREP  . COLONOSCOPY N/A 11/28/2013   RMR: Radiation proctitis. Colonic diverticulosis. Colonic polyps-removed as described above.   . INSERTION OF DIALYSIS CATHETER Right 01/19/2016   Procedure: INSERTION OF Right internal jugular  DIALYSIS CATHETER.;  Surgeon: Elam Dutch, MD;  Location: [place];  Service: Vascular;  Laterality: Right;  . PERIPHERAL VASCULAR CATHETERIZATION Bilateral 12/06/2015   Procedure: Upper Extremity Venography/ central venogram;  Surgeon: Conrad Giltner, MD;  Location: [place] CV LAB;  Service: Cardiovascular;  Laterality: Bilateral;  . PERIPHERAL VASCULAR CATHETERIZATION N/A 05/29/2016   Procedure: Upper Extremity Angiography;  Surgeon: Conrad Elberta, MD;  Location: [place] CV LAB;  Service: Cardiovascular;  Laterality: N/A;  . PERIPHERAL VASCULAR CATHETERIZATION N/A 05/29/2016   Procedure: Lower Extremity Angiography;  Surgeon: Aaron Edelman  Starlyn Skeans, MD;  Location: [place] CV LAB;  Service: Cardiovascular;  Laterality: N/A;  . PERIPHERAL VASCULAR CATHETERIZATION  05/29/2016   Procedure: Peripheral Vascular Intervention;  Surgeon: Conrad [place], MD;  Location: [place] CV LAB;  Service: Cardiovascular;;  . REMOVAL OF A HERO DEVICE Right 01/19/2016   Procedure: REMOVAL OF A HERO DEVICE;  Surgeon: Elam Dutch, MD;  Location: [place];  Service: Vascular;  Laterality: Right;  . SHUNTOGRAM N/A 10/08/2012   Procedure: [name];  Surgeon: Elam Dutch, MD;  Location: [facility] CATH LAB;  Service: Cardiovascular;  Laterality: N/A;  . VASCULAR ACCESS DEVICE INSERTION Right 01/01/2016   Procedure: INSERTION OF HERO VASCULAR ACCESS DEVICE - RIGHT;  Surgeon: Conrad [place], MD;  Location: [place];  Service: Vascular;  Laterality: Right;    Family History  Problem Relation Age of Onset  . Heart disease Mother   . Diabetes Mother   . Hypertension Mother   . Stroke Mother   . Heart disease Father   . Diabetes Father   . Heart disease Sister   . Diabetes Brother   . Colon cancer Neg Hx     Social History:  reports that he has been smoking Cigarettes.  He has a 12.50 pack-year smoking history. He has never used smokeless tobacco. He reports that he does not drink alcohol or use drugs.  Allergies: No Known Allergies  Medications: I have reviewed the patient's current medications.  Results for orders placed or performed during the hospital encounter of 09/08/16 (from the past 48 hour(s))  CBG monitoring, ED     Status: Abnormal   Collection Time: 09/08/16  7:12 AM  Result Value Ref Range   Glucose-Capillary 12 (LL) 65 - 99 mg/dL   Comment 1 Notify RN   POC CBG, ED     Status: Abnormal   Collection Time: 09/08/16  7:24 AM  Result Value Ref Range   Glucose-Capillary 226 (H) 65 - 99 mg/dL  CBG monitoring, ED     Status: Abnormal   Collection Time: 09/08/16  7:53 AM  Result Value Ref Range   Glucose-Capillary 257 (H) 65 - 99 mg/dL   Comment 1 Notify RN   CBC with Differential     Status: Abnormal   Collection Time: 09/08/16  8:25 AM  Result Value Ref Range   WBC 15.3 (H) 4.0 - 10.5 K/uL   RBC 3.01 (L) 4.22 - 5.81 MIL/uL   Hemoglobin 10.7 (L) 13.0 - 17.0 g/dL   HCT 32.6 (L) 39.0 - 52.0 %   MCV 108.3 (H) 78.0 - 100.0 fL   MCH 35.5 (H) 26.0 - 34.0 pg   MCHC 32.8 30.0 - 36.0 g/dL   RDW 17.9 (H) 11.5 - 15.5 %   Platelets  229 150 - 400 K/uL   Neutrophils Relative % 94 %   Neutro Abs 14.3 (H) 1.7 - 7.7 K/uL   Lymphocytes Relative 4 %   Lymphs Abs 0.7 0.7 - 4.0 K/uL   Monocytes Relative 2 %   Monocytes Absolute 0.3 0.1 - 1.0 K/uL   Eosinophils Relative 0 %   Eosinophils Absolute 0.0 0.0 - 0.7 K/uL   Basophils Relative 0 %   Basophils Absolute 0.0 0.0 - 0.1 K/uL  Comprehensive metabolic panel     Status: Abnormal   Collection Time: 09/08/16  8:25 AM  Result Value Ref Range   Sodium 133 (L) 135 - 145 mmol/L   Potassium 4.9 3.5 - 5.1 mmol/L  Chloride 93 (L) 101 - 111 mmol/L   CO2 27 22 - 32 mmol/L   Glucose, Bld 232 (H) 65 - 99 mg/dL   BUN 46 (H) 6 - 20 mg/dL   Creatinine, Ser 11.69 (H) 0.61 - 1.24 mg/dL   Calcium 8.3 (L) 8.9 - 10.3 mg/dL   Total Protein 6.8 6.5 - 8.1 g/dL   Albumin 3.0 (L) 3.5 - 5.0 g/dL   AST 12 (L) 15 - 41 U/L   ALT 7 (L) 17 - 63 U/L   Alkaline Phosphatase 58 38 - 126 U/L   Total Bilirubin 0.4 0.3 - 1.2 mg/dL   GFR calc non Af Amer 4 (L) >60 mL/min   GFR calc Af Amer 4 (L) >60 mL/min    Comment: (NOTE) The eGFR has been calculated using the CKD EPI equation. This calculation has not been validated in all clinical situations. eGFR's persistently <60 mL/min signify possible Chronic Kidney Disease.    Anion gap 13 5 - 15  Troponin I     Status: Abnormal   Collection Time: 09/08/16  8:25 AM  Result Value Ref Range   Troponin I 0.03 (HH) <0.03 ng/mL    Comment: CRITICAL RESULT CALLED TO, READ BACK BY AND VERIFIED WITH: CREWS,M AT 9:20AM ON 09/08/16 BY FESTERMAN,C   POC CBG, ED     Status: Abnormal   Collection Time: 09/08/16  9:22 AM  Result Value Ref Range   Glucose-Capillary 172 (H) 65 - 99 mg/dL    Dg Chest Port 1 View  Result Date: 09/08/2016 CLINICAL DATA:  Altered mental status today. History of dialysis dependent renal failure, diabetes, hypertension, current smoker. Prostate malignancy treated with radiation therapy. EXAM: PORTABLE CHEST 1 VIEW COMPARISON:  PA  and lateral chest x-ray of June 27, 2016 FINDINGS: The lungs are less well inflated today. The interstitial markings are increased. There is no alveolar infiltrate or pleural effusion. The cardiac silhouette is top-normal in size. The central pulmonary vascularity is prominent and there is mild cephalization. The dialysis catheter tip projects at the cavoatrial junction. There is calcification in the wall of the aortic arch. The bony thorax exhibits no acute abnormality. IMPRESSION: Mild pulmonary interstitial edema likely of cardiac cause. No alveolar pneumonia or significant pleural effusion. Electronically Signed   By: David  Martinique M.D.   On: 09/08/2016 07:52    Review of Systems  Constitutional: Positive for malaise/fatigue. Negative for chills and fever.  Respiratory: Negative for cough and shortness of breath.   Cardiovascular: Negative for chest pain, orthopnea and leg swelling.  Gastrointestinal: Negative for nausea and vomiting.  Neurological: Positive for weakness.   Blood pressure 167/74, pulse 75, temperature 97.4 F (36.3 C), temperature source Rectal, resp. rate 13, height 6' (1.829 m), weight 68 kg (150 lb), SpO2 97 %. Physical Exam  Constitutional: No distress.  Eyes: No scleral icterus.  Neck: No JVD present.  Cardiovascular: Normal rate and regular rhythm.   Respiratory: No respiratory distress. He has no wheezes.  GI: He exhibits no distension. There is no tenderness.  Musculoskeletal: He exhibits no edema.    Assessment/Plan:  problem #1 end-stage renal disease: His status post hemodialysis on Friday. Presently his potassium is normal. Patient presently does not have any nausea or vomiting. Problem #2 anemia: His hemoglobin is within target goal Problem #3 hypertension: His blood pressure is reasonably controlled. Patient has episode of hypotension. His blood pressure seems to be somewhat high now. Problem #4 metabolic bone disease: His calcium is range  but his  phosphorus is not available. Problem #5 altered mental status: Possibly secondary to hypoglycemia. Presently feeling better. Problem #6 diabetes Problem #7 fluid management: Presently patient denies and difficulty breathing. Problem #8 leukocytosis: Patient is afebrile. Presently he has a catheter. At this morning to rule out infection. Plan: We'll make arrangements for patient to get dialysis for 4 hours 2] to try to remove about 3 L 6 blood pressure tolerates 3] we'll check his renal panel and CBC in the morning. 4] we'll do blood culture on dialysis. At this moment possibly may require antibiotics empirically.  Keilani Terrance S 09/08/2016, 10:02 AM

## 2016-09-08 NOTE — Progress Notes (Signed)
PT Cancellation Note  Patient Details Name: DEUNDRA MEHLE MRN: [id number] DOB: 05-14-1941   Cancelled Treatment:    Reason Eval/Treat Not Completed: Patient at procedure or test/unavailable (Pt is off the floor at dialysis.  Will check back tomorrow for initial PT evaluation. )   Beth Keithan Dileonardo, PT, DPT X: [phone]

## 2016-09-09 DIAGNOSIS — R410 Disorientation, unspecified: Secondary | ICD-10-CM | POA: Diagnosis not present

## 2016-09-09 DIAGNOSIS — E162 Hypoglycemia, unspecified: Secondary | ICD-10-CM | POA: Diagnosis not present

## 2016-09-09 DIAGNOSIS — G9340 Encephalopathy, unspecified: Secondary | ICD-10-CM | POA: Diagnosis not present

## 2016-09-09 LAB — CBC
HCT: 36.9 % — ABNORMAL LOW (ref 39.0–52.0)
Hemoglobin: 11.9 g/dL — ABNORMAL LOW (ref 13.0–17.0)
MCH: 35 pg — ABNORMAL HIGH (ref 26.0–34.0)
MCHC: 32.2 g/dL (ref 30.0–36.0)
MCV: 108.5 fL — ABNORMAL HIGH (ref 78.0–100.0)
Platelets: 180 10*3/uL (ref 150–400)
RBC: 3.4 MIL/uL — AB (ref 4.22–5.81)
RDW: 17.9 % — ABNORMAL HIGH (ref 11.5–15.5)
WBC: 8.4 10*3/uL (ref 4.0–10.5)

## 2016-09-09 LAB — BASIC METABOLIC PANEL
ANION GAP: 10 (ref 5–15)
BUN: 24 mg/dL — ABNORMAL HIGH (ref 6–20)
CALCIUM: 8.1 mg/dL — AB (ref 8.9–10.3)
CO2: 26 mmol/L (ref 22–32)
CREATININE: 7.67 mg/dL — AB (ref 0.61–1.24)
Chloride: 98 mmol/L — ABNORMAL LOW (ref 101–111)
GFR calc Af Amer: 7 mL/min — ABNORMAL LOW (ref >60)
GFR, EST NON AFRICAN AMERICAN: 6 mL/min — AB (ref >60)
GLUCOSE: 90 mg/dL (ref 65–99)
Potassium: 4.9 mmol/L (ref 3.5–5.1)
Sodium: 134 mmol/L — ABNORMAL LOW (ref 135–145)

## 2016-09-09 LAB — RENAL FUNCTION PANEL
ALBUMIN: 2.9 g/dL — AB (ref 3.5–5.0)
ANION GAP: 10 (ref 5–15)
BUN: 23 mg/dL — ABNORMAL HIGH (ref 6–20)
CALCIUM: 8.2 mg/dL — AB (ref 8.9–10.3)
CO2: 27 mmol/L (ref 22–32)
CREATININE: 7.63 mg/dL — AB (ref 0.61–1.24)
Chloride: 96 mmol/L — ABNORMAL LOW (ref 101–111)
GFR calc Af Amer: 7 mL/min — ABNORMAL LOW (ref >60)
GFR calc non Af Amer: 6 mL/min — ABNORMAL LOW (ref >60)
GLUCOSE: 92 mg/dL (ref 65–99)
PHOSPHORUS: 3.8 mg/dL (ref 2.5–4.6)
Potassium: 4.9 mmol/L (ref 3.5–5.1)
SODIUM: 133 mmol/L — AB (ref 135–145)

## 2016-09-09 LAB — GLUCOSE, CAPILLARY
GLUCOSE-CAPILLARY: 124 mg/dL — AB (ref 65–99)
Glucose-Capillary: 117 mg/dL — ABNORMAL HIGH (ref 65–99)

## 2016-09-09 LAB — HEMOGLOBIN A1C
HEMOGLOBIN A1C: 5.1 % (ref 4.8–5.6)
MEAN PLASMA GLUCOSE: 100 mg/dL

## 2016-09-09 MED ORDER — INSULIN DETEMIR 100 UNIT/ML ~~LOC~~ SOLN: 5.0000 [IU] | Freq: Every day | SUBCUTANEOUS | 11 refills | Status: DC

## 2016-09-09 NOTE — Evaluation (Signed)
Occupational Therapy Evaluation Patient Details Name: Gerald Davenport MRN: [id number] DOB: 1941/07/12 Today's Date: 09/09/2016    History of Present Illness Gerald Davenport is a 75 y.o. male with medical history significant of hypertension, diabetes on insulin with peripheral vascular disease status post right below-knee amputation, ESRD on hemodialysis Monday Wednesday Friday, anemia of chronic kidney disease, history of infected dialysis graft currently has tunneled catheter brought in by patient's son for the evaluation of confusion. Usually patient gets up and drives to hemodialysis but today patient was found to be confused and disoriented and brought to the hospital for evaluation. In the ER patient was found to have blood sugar of 12 and hypothermic. Treated with dextrose with improvement in mental status. Patient was also found to have leukocytosis.   Clinical Impression   Pt awake, alert, oriented x4 this am, agreeable to OT evaluation. Evaluation completed at bed level and sitting at EOB as pt does not have RLE prosthesis at hospital. Pt demonstrates BUE strength WFL, RUE coordination and sensation deficits present at baseline. Pt demonstrates baseline independence in ADL completion, does require assistance for buttons and ties during dressing due to coordination deficits in RUE. Pt does not require any further OT services at this time.     Follow Up Recommendations  No OT follow up    Equipment Recommendations  None recommended by OT       Precautions / Restrictions Precautions Precautions: Fall Precaution Comments: Pt with RLE BKA-uses prosthesis  Restrictions Weight Bearing Restrictions: No      Mobility Bed Mobility Overal bed mobility: Modified Independent                Transfers                 General transfer comment: Not tested         ADL Overall ADL's : Needs assistance/impaired;At baseline Eating/Feeding: Set up;Bed level Eating/Feeding  Details (indicate cue type and reason): Assistance for opening containers             Upper Body Dressing : Minimal assistance;Bed level Upper Body Dressing Details (indicate cue type and reason): Assistance for buttons and ties                   General ADL Comments: Functional mobility not tested as pt does not have prosthesis at hospital     Vision Vision Assessment?: No apparent visual deficits          Pertinent Vitals/Pain Pain Assessment: No/denies pain     Hand Dominance Right   Extremity/Trunk Assessment Upper Extremity Assessment Upper Extremity Assessment: Overall WFL for tasks assessed (RUE sensation and coordination deficits at baseline)   Lower Extremity Assessment Lower Extremity Assessment: Defer to PT evaluation       Communication Communication Communication: Receptive difficulties (slurred speech/difficult to understand at baseline)   Cognition Arousal/Alertness: Awake/alert Behavior During Therapy: WFL for tasks assessed/performed Overall Cognitive Status: Within Functional Limits for tasks assessed                                Home Living Family/patient expects to be discharged to:: Private residence Living Arrangements: Spouse/significant other Available Help at Discharge: Family;Available 24 hours/day Type of Home: House Home Access: Ramped entrance     Home Layout: One level     Bathroom Shower/Tub: Occupational psychologist: Standard     Home  Equipment: Gilford Rile - 2 wheels;Cane - single point;Shower seat          Prior Functioning/Environment Level of Independence: Needs assistance  Gait / Transfers Assistance Needed: Pt uses prosthesis for ambulation, has a walker and cane but rarely uses ADL's / Homemaking Assistance Needed: Pt requires assistance with buttoning shirts and opening food packets/containers due to poor RUE coordination          End of Session    Activity Tolerance: Patient  tolerated treatment well Patient left: in bed;with call bell/phone within reach;with bed alarm set   Time: [id number] OT Time Calculation (min): 16 min Charges:  OT General Charges $OT Visit: 1 Procedure OT Evaluation $OT Eval Low Complexity: 1 Procedure G-Codes: OT G-codes **NOT FOR INPATIENT CLASS** Functional Assessment Tool Used: clinical judgement Functional Limitation: Self care Self Care Current Status [id number]): At least 20 percent but less than 40 percent impaired, limited or restricted Self Care Goal Status [id number]): At least 20 percent but less than 40 percent impaired, limited or restricted Self Care Discharge Status [phone]): At least 20 percent but less than 40 percent impaired, limited or restricted   Guadelupe Sabin, OTR/L  [phone] 09/09/2016, 8:56 AM

## 2016-09-09 NOTE — Progress Notes (Signed)
Subjective: Interval History: has no complaint of difficulty in breathing. He denies also any weakness or dizziness. His appetite is good.  Objective: Vital signs in last 24 hours: Temp:  [97.4 F (36.3 C)-98.8 F (37.1 C)] 98.2 F (36.8 C) (12/12 0542) Pulse Rate:  [73-102] 79 (12/12 0542) Resp:  [12-21] 20 (12/12 0542) BP: (158-190)/(65-97) 166/72 (12/12 0542) SpO2:  [96 %-100 %] 100 % (12/12 0542) Weight:  [68 kg (149 lb 14.6 oz)-68.1 kg (150 lb 2.1 oz)] 68.1 kg (150 lb 2.1 oz) (12/12 0542) Weight change:   Intake/Output from previous day: 12/11 0701 - 12/12 0700 In: -  Out: 2800  Intake/Output this shift: No intake/output data recorded.  General appearance: alert, cooperative and no distress Resp: clear to auscultation bilaterally Cardio: regular rate and rhythm Extremities: no edema  Lab Results:  Recent Labs  09/08/16 0825 09/09/16 0539  WBC 15.3* 8.4  HGB 10.7* 11.9*  HCT 32.6* 36.9*  PLT 229 180   BMET:  Recent Labs  09/08/16 1310 09/09/16 0539  NA 135 133*  134*  K 5.6* 4.9  4.9  CL 97* 96*  98*  CO2 27 27  26   GLUCOSE 115* 92  90  BUN 47* 23*  24*  CREATININE 12.26* 7.63*  7.67*  CALCIUM 8.4* 8.2*  8.1*   No results for input(s): PTH in the last 72 hours. Iron Studies: No results for input(s): IRON, TIBC, TRANSFERRIN, FERRITIN in the last 72 hours.  Studies/Results: Ct Head Wo Contrast  Result Date: 09/08/2016 CLINICAL DATA:  Altered mental status prior to dialysis this morning. No known injury. EXAM: CT HEAD WITHOUT CONTRAST TECHNIQUE: Contiguous axial images were obtained from the base of the skull through the vertex without intravenous contrast. COMPARISON:  None. FINDINGS: Brain: There is an abnormal extra-axial fluid collection measuring 1 cm in diameter over the left cerebral convexities. The majority collection demonstrates intermediate attenuation most consistent with a subacute subdural hematoma but there is the thin  hyperattenuating component present. There is no associated midline shift. No evidence of acute infarction, subarachnoid hemorrhage, mass lesion or hydrocephalus is identified. No pneumocephalus. Atrophy and chronic microvascular ischemic change are identified. Vascular: Extensive atherosclerotic vascular disease is seen. Otherwise unremarkable. Skull: Intact. Sinuses/Orbits: Negative. Other: None. IMPRESSION: 1 cm in diameter subacute subdural hemorrhage with a small acute appearing component on the left without midline shift. Atrophy and chronic microvascular ischemic change. Critical Value/emergent results were called by telephone at the time of interpretation on 09/08/2016 at 11:07 am to Dr. Lawson Radar , who verbally acknowledged these results. Electronically Signed   By: Inge Rise M.D.   On: 09/08/2016 11:13   Dg Chest Port 1 View  Result Date: 09/08/2016 CLINICAL DATA:  Altered mental status today. History of dialysis dependent renal failure, diabetes, hypertension, current smoker. Prostate malignancy treated with radiation therapy. EXAM: PORTABLE CHEST 1 VIEW COMPARISON:  PA and lateral chest x-ray of June 27, 2016 FINDINGS: The lungs are less well inflated today. The interstitial markings are increased. There is no alveolar infiltrate or pleural effusion. The cardiac silhouette is top-normal in size. The central pulmonary vascularity is prominent and there is mild cephalization. The dialysis catheter tip projects at the cavoatrial junction. There is calcification in the wall of the aortic arch. The bony thorax exhibits no acute abnormality. IMPRESSION: Mild pulmonary interstitial edema likely of cardiac cause. No alveolar pneumonia or significant pleural effusion. Electronically Signed   By: David  Martinique M.D.   On: 09/08/2016 07:52  I have reviewed the patient's current medications.  Assessment/Plan: Problem #1 altered mental status: Presently patient is alert and oriented.  Patient was found to have small subacute stay subdural hemorrhage. Problem #2 end-stage renal disease: He is status post hemodialysis yesterday. His potassium is normal and he doesn't have any uremic signs and symptoms.   problem #3 metabolic bone disease: His calcium and phosphorus is in range Problem #4 anemia: His hemoglobin is 11.9. Problem #5 history of prostate CA Problem #6 history of diabetes Problem # 7 history of hypertension: His blood pressure is reasonably controlled Plan: Patient doesn't require dialysis today 2] will make her regimen for patient to get dialysis tomorrow which is his regular schedule. 3] will continue his other medications as before.  LOS: 0 days   Taron Mondor S 09/09/2016,8:14 AM

## 2016-09-09 NOTE — Care Management Note (Signed)
Case Management Note  Patient Details  Name: Gerald Davenport MRN: [id number] Date of Birth: 1940/12/30  Subjective/Objective:                  Pt admitted with encephalopathy. He is from home, lives with his wife. He is ind with ADL's. He is on HD three days a week. He has PCP, transportation to appointments and no difficulty affording medications. MD has ordered [facility] for nursing follow up. Pt is agreeable and would like to use [facility] as he has used them in the past. Pt and wife aware that [place] has 48hrs to make first visit. Romualdo Bolk, of [facility], aware of referral and will obtain pt info from chart.   Action/Plan: Pt discharging home today with [facility] services through [facility].   Expected Discharge Date:       09/09/2016           Expected Discharge Plan:  [place]  In-House Referral:  NA  Discharge planning Services  CM Consult  Post Acute Care Choice:  Home Health Choice offered to:  Patient  HH Arranged:  RN [facility] Agency:  [place]  Status of Service:  Completed, signed off  Sherald Barge, RN 09/09/2016, 3:05 PM

## 2016-09-09 NOTE — Progress Notes (Addendum)
Inpatient Diabetes Program Recommendations  AACE/ADA: New Consensus Statement on Inpatient Glycemic Control (2015)  Target Ranges:  Prepandial:   less than 140 mg/dL      Peak postprandial:   less than 180 mg/dL (1-2 hours)      Critically ill patients:  140 - 180 mg/dL   Results for GEROD, BOHRER (MRN [id number]) as of 09/09/2016 09:04  Ref. Range 09/08/2016 07:12 09/08/2016 07:24 09/08/2016 07:53 09/08/2016 09:22 09/08/2016 20:48  Glucose-Capillary Latest Ref Range: 65 - 99 mg/dL 12 (LL) 226 (H) 257 (H) 172 (H) 233 (H)   Results for DONTAVION, EVEN (MRN [id number]) as of 09/09/2016 09:04  Ref. Range 09/09/2016 07:09  Glucose-Capillary Latest Ref Range: 65 - 99 mg/dL 117 (H)    Admit with: Hypoglycemia  History: DM, ESRD  Home DM Meds: Levemir 25 units QHS       Onglyza 5 mg daily  Current Insulin Orders: Novolog Sensitive Correction Scale/ SSI (0-9 units) TID AC + HS      -Note patient had dialysis yesterday.  Scheduled to get dialysis again tomorrow (12/13).  -CBGs have stabilized since admission.  AM CBG today: 117 mg/dl.     MD- Note patient's current A1c is 5.1%.  Perhaps we should consider reducing patient's home dose of Levemir prior to discharge?  Onglyza has low risk of Hypoglycemia as it operates in a glucose dependent fashion (works less when glucose levels are lower).  Recommend allowing pt to continue Onglyza and perhaps reducing Home Levemir dose at time of discharge.     Spoke with patient this AM by phone.  Patient told me he thinks his PCP (Dr. Donnie [facility]) started him on his two DM medications.  Took his Levemir insulin the night before and does not know what precipitated the Hypoglycemic event at home.  Discussed with patient that the doctors here in the hospital may adjust his DM medications at time of discharge.  Reviewed signs and symptoms of Hypoglycemia with patient and appropriate treatment of HYPO at home.  Encouraged pt to keep juice boxes at home  and to carry either glucose tablets or sugary candy in his pocket at all times just in case he has Hypoglycemia in the future.        --Will follow patient during hospitalization--  Wyn Quaker RN, MSN, CDE Diabetes Coordinator Inpatient Glycemic Control Team Team Pager: [phone] (8a-5p)

## 2016-09-09 NOTE — Discharge Summary (Signed)
Physician Discharge Summary  Gerald Davenport [id number] DOB: 03-24-1941 DOA: 09/08/2016  PCP: Donnie Coffin, MD  Admit date: 09/08/2016 Discharge date: 09/09/2016  Admitted From:home Disposition:home with home care RN.  Recommendations for Outpatient Follow-up:  1. Follow up with PCP in 1-2 weeks 2. Please obtain BMP/CBC in one week 3. Please follow up with neurosurgeon Dr. Annette Stable in 1-2 weeks.   Home Health: Yes Equipment/Devices:no Discharge Condition:stable CODE STATUS:full Diet recommendation:Heart healthy diet  Brief/Interim Summary:75 y.o. male with medical history significant of hypertension, diabetes on insulin with peripheral vascular disease status post right below-knee amputation, ESRD on hemodialysis Monday Wednesday Friday, anemia of chronic kidney disease, history of infected dialysis graft currently has tunneled catheter brought in by patient's son for the evaluation of confusion.Usually patient gets up and drives to hemodialysis but on the day of admission patient was found to be confused and disoriented and brought to the hospital for evaluation. In the ER patient was found to have blood sugar of 12 and hypothermic. Treated with dextrose with improvement in mental status. He denied fever, chills, nausea, vomiting, headache, dizziness, chest pain, shortness of breath or abdomen pain.  CT scan of head showed 1 cm in diameter subacute subdural hemorrhage with a small acute appearing component on the left without midline shift. It was discussed with Dr. Annette Stable from neurosurgery who recommended discontinuing aspirin or any blood thinner and continue medical management. Outpatient follow-up with Dr. Annette Stable recommended.   The patient's mental status significantly improved today. He was alert awake and waned at 3. I think his altered mental status likely due to acute metabolic encephalopathy contributed by hypoglycemia. HbA1c came back 5.1. I discussed with the patient at bedside and  reduced the dose of Levemir to only 5 from 25 units. Patient was educated on monitoring blood sugar level at home. Also educated to adjust the dose of insulin if he doesn't eat or eat less amount of food. I advised him to follow up with his PCP in a week. His hypothermia improved. Leukocytosis improved without antibiotics. Blood cultures are pending. Because of significant clinical improvement and the catheter site looks clean, nontender, I do not think patient has infection. Received hemodialysis treatment by nephrologist. Evaluated by physical therapy recommended no PT. Given hypoglycemic event causing encephalopathy, I ordered home care for visiting nurse. I discussed this with the case manager and social worker during morning discussion around. At this time, patient is medically stable to go home with close outpatient follow-up.  Patient is already on a statin.  Discharge Diagnoses:  Active Problems:   Essential hypertension, benign   ESRD on dialysis [facility])   Acute encephalopathy   Hypoglycemia    Discharge Instructions  Discharge Instructions    Call MD for:  difficulty breathing, headache or visual disturbances    Complete by:  As directed    Call MD for:  hives    Complete by:  As directed    Call MD for:  persistant dizziness or light-headedness    Complete by:  As directed    Call MD for:  persistant nausea and vomiting    Complete by:  As directed    Call MD for:  severe uncontrolled pain    Complete by:  As directed    Call MD for:  temperature >100.4    Complete by:  As directed    Diet - low sodium heart healthy    Complete by:  As directed    Diet Carb Modified  Complete by:  As directed    Discharge instructions    Complete by:  As directed    The dose of insulin reduced on discharge. Please monitor blood sugar level at home and follow up with your PCP. Please stop Aspirin or any blood thinner until you are evaluated by your PCP or neurosurgeon because of subdural  hematoma.   Increase activity slowly    Complete by:  As directed        Medication List    STOP taking these medications   aspirin EC 81 MG tablet   Vancomycin 750-5 MG/150ML-% Soln Commonly known as:  VANCOCIN     TAKE these medications   acetaminophen-codeine 300-30 MG tablet Commonly known as:  TYLENOL #3 Take 1 tablet by mouth every 4 (four) hours as needed for moderate pain.   buPROPion 150 MG 24 hr tablet Commonly known as:  WELLBUTRIN XL Take 150 mg by mouth daily.   calcium acetate 667 MG capsule Commonly known as:  PHOSLO Take 667-2,001 mg by mouth See admin instructions. Take 2001 mg by mouth 3 times daily. Take 667 mg by mouth with snacks.   cloNIDine 0.2 MG tablet Commonly known as:  CATAPRES Take 1 tablet (0.2 mg total) by mouth 3 (three) times daily.   doxazosin 8 MG tablet Commonly known as:  CARDURA Take 8 mg by mouth daily. Reported on 02/26/2016   fluorometholone 0.1 % ophthalmic suspension Commonly known as:  FML Place 1 drop into both eyes at bedtime.   furosemide 40 MG tablet Commonly known as:  LASIX Take 40 mg by mouth daily.   gabapentin 100 MG capsule Commonly known as:  NEURONTIN Take 100 mg by mouth 2 (two) times daily.   insulin detemir 100 UNIT/ML injection Commonly known as:  LEVEMIR Inject 0.05 mLs (5 Units total) into the skin at bedtime. What changed:  how much to take   linaclotide 145 MCG Caps capsule Commonly known as:  LINZESS Take 1 capsule (145 mcg total) by mouth daily. Take 30 minutes before breakfast   omeprazole 20 MG capsule Commonly known as:  PRILOSEC Take 20 mg by mouth 2 (two) times daily before a meal.   ONGLYZA 5 MG Tabs tablet Generic drug:  saxagliptin HCl Take 5 mg by mouth daily with breakfast.   psyllium 58.6 % powder Commonly known as:  METAMUCIL Take 1 packet by mouth as needed (for stool).   QUEtiapine 100 MG tablet Commonly known as:  SEROQUEL Take 200 mg by mouth at bedtime.   REFRESH  OP Place 1 drop into both eyes at bedtime.   SENSIPAR 30 MG tablet Generic drug:  cinacalcet Take 30 mg by mouth daily.   sevelamer carbonate 800 MG tablet Commonly known as:  RENVELA Take 800-1,600 mg by mouth 3 (three) times daily with meals.   simvastatin 20 MG tablet Commonly known as:  ZOCOR Take 20 mg by mouth daily.      Follow-up Information    Donnie Coffin, MD. Schedule an appointment as soon as possible for a visit in 1 week(s).   Specialty:  Family Medicine Contact information: [address] [place] [address] [phone]        Charlie Pitter, MD. Schedule an appointment as soon as possible for a visit in 2 week(s).   Specialty:  Neurosurgery Why:  to follow up subdural hematoma. Contact information: [address] [place] [address] [name] [address] [phone]  No Known Allergies  Consultations: Nephrologist. Discussed with the neurosurgeon over the phone.  Procedures/Studies: Hemodialysis  Subjective: Patient was seen and examined at bedside. He denied any symptoms. He was alert awake and oriented. Denied headache, dizziness, nausea, vomiting, chest pain or shortness of breath.   Discharge Exam: Vitals:   09/09/16 0542 09/09/16 0859  BP: (!) 166/72 (!) 165/63  Pulse: 79   Resp: 20   Temp: 98.2 F (36.8 C)    Vitals:   09/08/16 1750 09/08/16 2049 09/09/16 0542 09/09/16 0859  BP: (!) 181/88 (!) 166/79 (!) 166/72 (!) 165/63  Pulse: 87 88 79   Resp: 18 18 20    Temp: 98.8 F (37.1 C) 98.8 F (37.1 C) 98.2 F (36.8 C)   TempSrc: Oral Oral Oral   SpO2:  100% 100%   Weight:   68.1 kg (150 lb 2.1 oz)   Height:        General: Pt is alert, awake, not in acute distress Cardiovascular: RRR, S1/S2 +, no rubs, no gallops Respiratory: CTA bilaterally, no wheezing, no rhonchi Abdominal: Soft, NT, ND, bowel sounds + Extremities: no edema, no cyanosis. Right below-knee amputation Tunneled hemodialysis catheter  exit site clean, nontender and no discharge noticed   The results of significant diagnostics from this hospitalization (including imaging, microbiology, ancillary and laboratory) are listed below for reference.     Microbiology: Recent Results (from the past 240 hour(s))  Blood culture (routine x 2)     Status: None (Preliminary result)   Collection Time: 09/08/16  8:26 AM  Result Value Ref Range Status   Specimen Description BLOOD BLOOD LEFT FOREARM  Final   Special Requests BOTTLES DRAWN AEROBIC AND ANAEROBIC 8CC EACH  Final   Culture PENDING  Incomplete   Report Status PENDING  Incomplete  Blood culture (routine x 2)     Status: None (Preliminary result)   Collection Time: 09/08/16  8:33 AM  Result Value Ref Range Status   Specimen Description BLOOD BLOOD LEFT HAND  Final   Special Requests BOTTLES DRAWN AEROBIC ONLY 4CC  Final   Culture PENDING  Incomplete   Report Status PENDING  Incomplete     Labs: BNP (last 3 results) No results for input(s): BNP in the last 8760 hours. Basic Metabolic Panel:  Recent Labs Lab 09/08/16 0825 09/08/16 1310 09/09/16 0539  NA 133* 135 133*  134*  K 4.9 5.6* 4.9  4.9  CL 93* 97* 96*  98*  CO2 27 27 27  26   GLUCOSE 232* 115* 92  90  BUN 46* 47* 23*  24*  CREATININE 11.69* 12.26* 7.63*  7.67*  CALCIUM 8.3* 8.4* 8.2*  8.1*  PHOS  --  3.7 3.8   Liver Function Tests:  Recent Labs Lab 09/08/16 0825 09/08/16 1310 09/09/16 0539  AST 12*  --   --   ALT 7*  --   --   ALKPHOS 58  --   --   BILITOT 0.4  --   --   PROT 6.8  --   --   ALBUMIN 3.0* 3.0* 2.9*   No results for input(s): LIPASE, AMYLASE in the last 168 hours. No results for input(s): AMMONIA in the last 168 hours. CBC:  Recent Labs Lab 09/08/16 0825 09/09/16 0539  WBC 15.3* 8.4  NEUTROABS 14.3*  --   HGB 10.7* 11.9*  HCT 32.6* 36.9*  MCV 108.3* 108.5*  PLT 229 180   Cardiac Enzymes:  Recent Labs Lab 09/08/16 0825 09/08/16 1310  TROPONINI 0.03*  0.03*   BNP: Invalid input(s): POCBNP CBG:  Recent Labs Lab 09/08/16 0724 09/08/16 0753 09/08/16 0922 09/08/16 2048 09/09/16 0709  GLUCAP 226* 257* 172* 233* 117*   D-Dimer No results for input(s): DDIMER in the last 72 hours. Hgb A1c  Recent Labs  09/08/16 0826  HGBA1C 5.1   Lipid Profile No results for input(s): CHOL, HDL, LDLCALC, TRIG, CHOLHDL, LDLDIRECT in the last 72 hours. Thyroid function studies  Recent Labs  09/08/16 0826  TSH 2.487   Anemia work up No results for input(s): VITAMINB12, FOLATE, FERRITIN, TIBC, IRON, RETICCTPCT in the last 72 hours. Urinalysis No results found for: COLORURINE, APPEARANCEUR, [place], [place], [place], [place], [place], [place], PROTEINUR, UROBILINOGEN, NITRITE, LEUKOCYTESUR Sepsis Labs Invalid input(s): PROCALCITONIN,  WBC,  LACTICIDVEN Microbiology Recent Results (from the past 240 hour(s))  Blood culture (routine x 2)     Status: None (Preliminary result)   Collection Time: 09/08/16  8:26 AM  Result Value Ref Range Status   Specimen Description BLOOD BLOOD LEFT FOREARM  Final   Special Requests BOTTLES DRAWN AEROBIC AND ANAEROBIC [place]  Final   Culture PENDING  Incomplete   Report Status PENDING  Incomplete  Blood culture (routine x 2)     Status: None (Preliminary result)   Collection Time: 09/08/16  8:33 AM  Result Value Ref Range Status   Specimen Description BLOOD BLOOD LEFT HAND  Final   Special Requests BOTTLES DRAWN AEROBIC ONLY 4CC  Final   Culture PENDING  Incomplete   Report Status PENDING  Incomplete     Time coordinating discharge: Over 30 minutes  SIGNED:   Rosita Fire, MD  [facility] 09/09/2016, 10:46 AM  If 7PM-7AM, please contact night-coverage [email or web address] Password [email or web address]

## 2016-09-09 NOTE — Care Management Obs Status (Signed)
[facility] NOTIFICATION   Patient Details  Name: Gerald Davenport MRN: [id number] Date of Birth: 11-Apr-1941   Medicare Observation Status Notification Given:  Yes    Sherald Barge, RN 09/09/2016, 3:02 PM

## 2016-09-09 NOTE — Evaluation (Signed)
Physical Therapy Evaluation Patient Details Name: Gerald Davenport MRN: [id number] DOB: 01-17-1941 Today's Date: 09/09/2016   History of Present Illness  Gerald Davenport is a 75 y.o. male with medical history significant of hypertension, diabetes on insulin with peripheral vascular disease status post right below-knee amputation, ESRD on hemodialysis Monday Wednesday Friday, anemia of chronic kidney disease, history of infected dialysis graft currently has tunneled catheter brought in by patient's son for the evaluation of confusion. Usually patient gets up and drives to hemodialysis but today patient was found to be confused and disoriented and brought to the hospital for evaluation. In the ER patient was found to have blood sugar of 12 and hypothermic. Treated with dextrose with improvement in mental status. Patient was also found to have leukocytosis.  Clinical Impression  Pt received in bed, and was agreeable to PT evaluation.  He states he normally ambulates independently with R prosthetic limb, and is independent with ADL's, still driving, and is an Industrial/product designer.  Pt was independent with supine<>sit, and Mod (I) for sit<>stand via squat pivot transfer, as well as Mod (I) for sit<>stand with RW due to not having R prosthetic limb.  He was also able to ambulate 5 steps with RW at Mod (I) level.  Pt expressed that he is at his baseline, and he demonstrates excellent mobility at this time.  No acute PT needs at this time, and therefore, will sign off.     Follow Up Recommendations No PT follow up    Equipment Recommendations  None recommended by PT    Recommendations for Other Services       Precautions / Restrictions Precautions Precautions: Fall Precaution Comments: 1 fall in the past 6 months when going out the back door.   Restrictions Weight Bearing Restrictions: No Other Position/Activity Restrictions: R BKA - states wife will be bringing his prosthesis later today.        Mobility  Bed Mobility Overal bed mobility: Independent                Transfers Overall transfer level: Needs assistance Equipment used: Rolling walker (2 wheeled) Transfers: Squat Pivot Transfers;Stand Pivot Transfers   Stand pivot transfers: Modified independent (Device/Increase time) Squat pivot transfers: Modified independent (Device/Increase time)     General transfer comment: without R prosthetic limb.   Ambulation/Gait Ambulation/Gait assistance: Modified independent (Device/Increase time) Ambulation Distance (Feet): 5 Feet Assistive device: Rolling walker (2 wheeled) Gait Pattern/deviations: WFL(Within Functional Limits)     General Gait Details: Pt was able to take a few steps fwd and a few back.    Stairs            Wheelchair Mobility    Modified Rankin (Stroke Patients Only)       Balance Overall balance assessment: History of Falls;Modified Independent                                           Pertinent Vitals/Pain Pain Assessment: No/denies pain    Home Living Family/patient expects to be discharged to:: Private residence Living Arrangements: Spouse/significant other Available Help at Discharge: Family;Available 24 hours/day Type of Home: House Home Access: Ramped entrance     Home Layout: One level Home Equipment: Walker - 2 wheels;Cane - single point;Shower seat      Prior Function Level of Independence: Needs assistance   Gait / Transfers Assistance Needed:  Pt uses prosthesis for ambulation, has a walker and cane but rarely uses  ADL's / Homemaking Assistance Needed: independent with dressing and bathing, - assist for buttons.  Still driving.  Unlimited community ambulator.          Hand Dominance   Dominant Hand: Right    Extremity/Trunk Assessment   Upper Extremity Assessment: Overall WFL for tasks assessed           Lower Extremity Assessment: Overall WFL for tasks assessed;RLE  deficits/detail RLE Deficits / Details: R BKA - pt does not currently have his prosthetic limb, but states his wife is brining it later today.        Communication   Communication: Receptive difficulties (slurred speech/difficult to understand at baseline)  Cognition Arousal/Alertness: Awake/alert Behavior During Therapy: WFL for tasks assessed/performed Overall Cognitive Status: Within Functional Limits for tasks assessed                      General Comments      Exercises     Assessment/Plan    PT Assessment Patent does not need any further PT services  PT Problem List            PT Treatment Interventions DME instruction;Functional mobility training;Therapeutic activities;Therapeutic exercise;Balance training;Patient/family education;Gait training    PT Goals (Current goals can be found in the Care Plan section)  Acute Rehab PT Goals PT Goal Formulation: All assessment and education complete, DC therapy    Frequency     Barriers to discharge        Co-evaluation               End of Session Equipment Utilized During Treatment: Gait belt Activity Tolerance: Patient tolerated treatment well Patient left: in chair;with call bell/phone within reach Nurse Communication: Mobility status (mobility sheet placed in room. )    Functional Assessment Tool Used: [place] "6-clicks"  Functional Limitation: Mobility: Walking and moving around Mobility: Walking and Moving Around Current Status [phone]): At least 1 percent but less than 20 percent impaired, limited or restricted Mobility: Walking and Moving Around Goal Status [phone]): At least 1 percent but less than 20 percent impaired, limited or restricted Mobility: Walking and Moving Around Discharge Status [phone]): At least 1 percent but less than 20 percent impaired, limited or restricted    Time: 0855-0915 PT Time Calculation (min) (ACUTE ONLY): 20 min   Charges:   PT Evaluation $PT  Eval Low Complexity: 1 Procedure     PT G Codes:   PT G-Codes **NOT FOR INPATIENT CLASS** Functional Assessment Tool Used: [employer] "6-clicks"  Functional Limitation: Mobility: Walking and moving around Mobility: Walking and Moving Around Current Status [phone]): At least 1 percent but less than 20 percent impaired, limited or restricted Mobility: Walking and Moving Around Goal Status [phone]): At least 1 percent but less than 20 percent impaired, limited or restricted Mobility: Walking and Moving Around Discharge Status [phone]): At least 1 percent but less than 20 percent impaired, limited or restricted    Beth Syre Knerr, PT, DPT X: [phone]

## 2016-09-13 LAB — CULTURE, BLOOD (ROUTINE X 2)
CULTURE: NO GROWTH
Culture: NO GROWTH

## 2016-09-19 ENCOUNTER — Encounter: Payer: Self-pay | Admitting: Vascular Surgery

## 2016-09-26 ENCOUNTER — Encounter (HOSPITAL_COMMUNITY): Payer: Medicare Other

## 2016-09-26 ENCOUNTER — Ambulatory Visit: Payer: Medicare Other | Admitting: Vascular Surgery

## 2016-09-30 ENCOUNTER — Ambulatory Visit (INDEPENDENT_AMBULATORY_CARE_PROVIDER_SITE_OTHER): Payer: Medicare Other | Admitting: Podiatry

## 2016-09-30 ENCOUNTER — Encounter: Payer: Self-pay | Admitting: Podiatry

## 2016-09-30 VITALS — BP 191/96 | HR 96 | Resp 14

## 2016-09-30 DIAGNOSIS — Z8679 Personal history of other diseases of the circulatory system: Secondary | ICD-10-CM

## 2016-09-30 DIAGNOSIS — B351 Tinea unguium: Secondary | ICD-10-CM

## 2016-09-30 DIAGNOSIS — L84 Corns and callosities: Secondary | ICD-10-CM

## 2016-09-30 NOTE — Patient Instructions (Signed)

## 2016-09-30 NOTE — Progress Notes (Signed)
Postoperative Visit   History of Present Illness  Gerald Davenport is a 76 y.o. 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  His prior ulcers in that hand have healed but he continues to have weakness and pain in the hand.  His R arm angiogram demonstrated intact ulnar blood flow feeding his palmar arch with calcific occlusion of forearm radial artery.  He denies any new wounds in either hand.  He does have some difficulties with ADL with R hand at this point.  The patient notes his L leg intermittent claudication is minimal and not lifestyle limiting at this point.  The patient is able to complete their activities of daily living.     Past Medical History:  Diagnosis Date  . Anemia   . Anxiety   . Cancer [facility])    Prostate cancer treated with radiation therapy  . Chronic kidney disease    dialysis Mon, Wednes, Fri  . Clot 05/2006   Arterial clot right leg   . Constipation   . Diabetes mellitus    Type II  . Diverticulosis   . GERD (gastroesophageal reflux disease)   . Hemodialysis patient ([place]) 6/13  . Hyperlipidemia   . Hypertension   . Infection    drainage from distal incision of left upper arm arteriovenous graft  . Positive PPD   . Shortness of breath dyspnea    with exertion, when he has too much fluid  . Steal syndrome dialysis vascular access ([place])   . Thyroid disease    hyperparathyroidism  . Wears glasses     Past Surgical History:  Procedure Laterality Date  . AV FISTULA PLACEMENT  11/04/2010   left and left  . AV FISTULA PLACEMENT  11/20/2011   Procedure: INSERTION OF ARTERIOVENOUS (AV) GORE-TEX GRAFT ARM;  Surgeon: Angelia Mould, MD;  Location: [place];  Service: Vascular;  Laterality: Right;  . AV FISTULA PLACEMENT Right 04/19/2015   Procedure: Excision of infected RUA AVG;  Surgeon: Conrad Fairlawn, MD;  Location: [place];  Service: Vascular;   Laterality: Right;  . AV FISTULA PLACEMENT Left 10/04/2015   Procedure: INSERTION OF ARTERIOVENOUS (AV) GORE-TEX GRAFT ARM;  Surgeon: Conrad Mon[place] Mill, MD;  Location: [place];  Service: Vascular;  Laterality: Left;  . [place] REMOVAL Left 10/13/2015   Procedure: REMOVAL OF INFECTED ARTERIOVENOUS GORETEX LEFT GRAFT ;  Surgeon: Conrad Henderson, MD;  Location: [place];  Service: Vascular;  Laterality: Left;  . BASCILIC VEIN TRANSPOSITION Left 06/19/2015   Procedure: FIRST STAGE BRACHIAL VEIN TRANSPOSITION;  Surgeon: Conrad [place], MD;  Location: [place];  Service: Vascular;  Laterality: Left;  . 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 COLONOSCOPY N/A 11/10/2012   [id number] proctitis/Pancolonic diverticulosis/(TUBULAR ADENOMA)5 mm polyp in the transverse segment/4 mm polyps in the base of the cecum and diminutive polyp in the descending segment. INADEQUATE BOWEL PREP  . COLONOSCOPY N/A 11/28/2013   RMR: Radiation proctitis. Colonic diverticulosis. Colonic polyps-removed as described above.   . INSERTION OF DIALYSIS CATHETER Right 01/19/2016   Procedure: INSERTION OF Right internal jugular  DIALYSIS CATHETER.;  Surgeon: Elam Dutch, MD;  Location: [place];  Service: Vascular;  Laterality: Right;  . PERIPHERAL VASCULAR CATHETERIZATION Bilateral 12/06/2015   Procedure: Upper Extremity Venography/ central venogram;  Surgeon: Conrad , MD;  Location: [place] CV LAB;  Service:  Cardiovascular;  Laterality: Bilateral;  . PERIPHERAL VASCULAR CATHETERIZATION N/A 05/29/2016   Procedure: Upper Extremity Angiography;  Surgeon: Conrad Muenster, MD;  Location: [place] CV LAB;  Service: Cardiovascular;  Laterality: N/A;  . PERIPHERAL VASCULAR CATHETERIZATION N/A 05/29/2016   Procedure: Lower Extremity Angiography;  Surgeon: Conrad Beckwourth, MD;  Location: [place] CV LAB;  Service: Cardiovascular;  Laterality: N/A;  . PERIPHERAL VASCULAR CATHETERIZATION  05/29/2016   Procedure: Peripheral Vascular Intervention;   Surgeon: Conrad Henrietta, MD;  Location: [place] CV LAB;  Service: Cardiovascular;;  . REMOVAL OF A HERO DEVICE Right 01/19/2016   Procedure: REMOVAL OF A HERO DEVICE;  Surgeon: Elam Dutch, MD;  Location: [place];  Service: Vascular;  Laterality: Right;  . SHUNTOGRAM N/A 10/08/2012   Procedure: [name];  Surgeon: Elam Dutch, MD;  Location: [facility] CATH LAB;  Service: Cardiovascular;  Laterality: N/A;  . VASCULAR ACCESS DEVICE INSERTION Right 01/01/2016   Procedure: INSERTION OF HERO VASCULAR ACCESS DEVICE - RIGHT;  Surgeon: Conrad Hume, MD;  Location: [place];  Service: Vascular;  Laterality: Right;    Social History   Social History  . Marital status: Married    Spouse name: N/A  . Number of children: N/A  . Years of education: N/A   Occupational History  . Not on file.   Social History Main Topics  . Smoking status: Light Tobacco Smoker    Packs/day: 0.25    Years: 50.00    Types: Cigarettes  . Smokeless tobacco: Never Used     Comment: 4-5 cigarettes daily  . Alcohol use No  . Drug use: No  . Sexual activity: Not on file   Other Topics Concern  . Not on file   Social History Narrative  . No narrative on file    Family History  Problem Relation Age of Onset  . Heart disease Mother   . Diabetes Mother   . Hypertension Mother   . Stroke Mother   . Heart disease Father   . Diabetes Father   . Heart disease Sister   . Diabetes Brother   . Colon cancer Neg Hx     Current Outpatient Prescriptions  Medication Sig Dispense Refill  . acetaminophen-codeine (TYLENOL #3) 300-30 MG tablet Take 1 tablet by mouth every 4 (four) hours as needed for moderate pain. 10 tablet 0  . buPROPion (WELLBUTRIN XL) 150 MG 24 hr tablet Take 150 mg by mouth daily.    . calcium acetate (PHOSLO) 667 MG capsule Take 667-2,001 mg by mouth See admin instructions. Take 2001 mg by mouth 3 times daily. Take 667 mg by mouth with snacks.    . cloNIDine (CATAPRES) 0.2 MG tablet Take 1 tablet  (0.2 mg total) by mouth 3 (three) times daily. 42 tablet 0  . doxazosin (CARDURA) 8 MG tablet Take 8 mg by mouth daily. Reported on 02/26/2016    . fluorometholone (FML) 0.1 % ophthalmic suspension Place 1 drop into both eyes at bedtime.    . furosemide (LASIX) 40 MG tablet Take 40 mg by mouth daily.    Marland Kitchen gabapentin (NEURONTIN) 100 MG capsule Take 100 mg by mouth 2 (two) times daily.    . insulin detemir (LEVEMIR) 100 UNIT/ML injection Inject 0.05 mLs (5 Units total) into the skin at bedtime. 10 mL 11  . Linaclotide (LINZESS) 145 MCG CAPS capsule Take 1 capsule (145 mcg total) by mouth daily. Take 30 minutes before breakfast 30 capsule 11  . omeprazole (  PRILOSEC) 20 MG capsule Take 20 mg by mouth 2 (two) times daily before a meal.     . Polyvinyl Alcohol-Povidone (REFRESH OP) Place 1 drop into both eyes at bedtime.    . psyllium (METAMUCIL) 58.6 % powder Take 1 packet by mouth as needed (for stool).     . QUEtiapine (SEROQUEL) 100 MG tablet Take 200 mg by mouth at bedtime.     . saxagliptin HCl (ONGLYZA) 5 MG TABS tablet Take 5 mg by mouth daily with breakfast.     . SENSIPAR 30 MG tablet Take 30 mg by mouth daily.     . sevelamer carbonate (RENVELA) 800 MG tablet Take 800-1,600 mg by mouth 3 (three) times daily with meals.    . simvastatin (ZOCOR) 20 MG tablet Take 20 mg by mouth daily.     No current facility-administered medications for this visit.      No Known Allergies   REVIEW OF SYSTEMS:  (Positives checked otherwise negative)  CARDIOVASCULAR:   [ ]  chest pain,  [ ]  chest pressure,  [ ]  palpitations,  [ ]  shortness of breath when laying flat,  [ ]  shortness of breath with exertion,   [ ]  pain in feet when walking,  [ ]  pain in feet when laying flat, [ ]  history of blood clot in veins (DVT),  [ ]  history of phlebitis,  [ ]  swelling in legs,  [ ]  varicose veins  PULMONARY:   [ ]  productive cough,  [ ]  asthma,  [ ]  wheezing  NEUROLOGIC:   [x]  weakness in R>L hand [x]   numbness in R>L hand [ ]  difficulty speaking or slurred speech,  [ ]  temporary loss of vision in one eye,  [ ]  dizziness  HEMATOLOGIC:   [ ]  bleeding problems,  [ ]  problems with blood clotting too easily  MUSCULOSKEL:   [ ]  joint pain, [ ]  joint swelling  GASTROINTEST:   [ ]  vomiting blood,  [ ]  blood in stool     GENITOURINARY:   [ ]  burning with urination,  [ ]  blood in urine  PSYCHIATRIC:   [ ]  history of major depression  INTEGUMENTARY:   [ ]  rashes,  [ ]  ulcers  CONSTITUTIONAL:   [ ]  fever,  [ ]  chills   For VQI Use Only  PRE-ADM LIVING: Home  AMB STATUS: Ambulatory   Physical Examination  Vitals:   10/03/16 1550  BP: (!) 153/75  Pulse: 98  Resp: 16  Temp: 98.2 F (36.8 C)  TempSrc: Oral  SpO2: 99%  Weight: 152 lb (68.9 kg)  Height: 6' (1.829 m)    Body mass index is 20.61 kg/m.  General: A&O x 3, WDWN  Pulmonary: Sym exp, good air movt, CTAB, no rales, rhonchi, & wheezing  Cardiac: RRR, Nl S1, S2, no Murmurs, rubs or gallops  Vascular: Vessel Right Left  Radial Palpable Palpable  Ulnar Palpable Palpable  Brachial Palpable Palpable  Carotid Palpable, without bruit Palpable, without bruit  Aorta Not palpable N/A  Femoral Palpable Palpable  Popliteal Not palpable Not palpable  PT BKA Not Palpable  DP BKA Not Palpable   Gastrointestinal: soft, NTND, -G/R, - HSM, - masses, - CVAT B  Musculoskeletal: M/S 5/5 BLE, B hands with some thenar atrophy, R hand grip 2/5, L hand grip 3-4/5, Extremities without ischemic changes except R BKA, prior finger ulcers healed  Neurologic:  Pain and light touch intact in extremities except hands, Motor exam as listed above  ABI (Date: 10/03/2016)  L:   ABI: 0.81,   DP: mono  PT: mono  TBI: 0.33  Medical Decision Making  Gerald Davenport is a 76 y.o. (1941/01/02) male who presents s/p PTA+S R CIA, LLE multi-level PAD, R arm digital and radial disease.   The patient feels his R  hand complaints have progressed, suggesting the ulnar dependent hand flow is inadequate.    I reviewed his left arm angiogram and in my opinion: he will need a microsurgeon to reconstruct the radial artery.  I have referred a couple of patient to a Risk manager at [facility], Dr. Nicoletta Dress.  Will refer this patient to them, as I don't believe there is a Copy in [place] that routinely does these cases.  Based on his angiographic findings, this patient needs: q3 month ABI.  I discussed in depth with the patient the nature of atherosclerosis, and emphasized the importance of maximal medical management including strict control of blood pressure, blood glucose, and lipid levels, obtaining regular exercise, and cessation of smoking.  The patient is aware that without maximal medical management the underlying atherosclerotic disease process will progress, limiting the benefit of any interventions.  The patient is currently on a statin: Zocor.  The patient is currently on an anti-platelet: ASA.  Thank you for allowing [place] to participate in this patient's care.  Adele Barthel, MD, FACS [facility] [place] [facility] Office: [phone] Pager: [phone]

## 2016-09-30 NOTE — Progress Notes (Signed)
Patient ID: Gerald Davenport, male   DOB: 08-Nov-1940, 76 y.o.   MRN: [id number]    Subjective: This patient presents today complaining of uncomfortable toenails on the left foot and uncomfortable callus on the left foot requesting skin a nail debridement.  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

## 2016-10-03 ENCOUNTER — Encounter (HOSPITAL_COMMUNITY): Payer: Medicare Other

## 2016-10-03 ENCOUNTER — Ambulatory Visit (INDEPENDENT_AMBULATORY_CARE_PROVIDER_SITE_OTHER): Payer: Medicare Other | Admitting: Vascular Surgery

## 2016-10-03 ENCOUNTER — Encounter: Payer: Self-pay | Admitting: Vascular Surgery

## 2016-10-03 ENCOUNTER — Ambulatory Visit: Payer: Medicare Other | Admitting: Vascular Surgery

## 2016-10-03 ENCOUNTER — Ambulatory Visit (HOSPITAL_COMMUNITY)
Admission: RE | Admit: 2016-10-03 | Discharge: 2016-10-03 | Disposition: A | Discharge status: Home / Self Care | Payer: Medicare Other | Source: Ambulatory Visit | Attending: Vascular Surgery | Admitting: Vascular Surgery

## 2016-10-03 VITALS — BP 153/75 | HR 98 | Temp 98.2°F | Resp 16 | Ht 72.0 in | Wt 152.0 lb

## 2016-10-03 DIAGNOSIS — I70212 Atherosclerosis of native arteries of extremities with intermittent claudication, left leg: Secondary | ICD-10-CM | POA: Insufficient documentation

## 2016-10-03 DIAGNOSIS — R9439 Abnormal result of other cardiovascular function study: Secondary | ICD-10-CM | POA: Insufficient documentation

## 2016-10-03 DIAGNOSIS — T827XXA Infection and inflammatory reaction due to other cardiac and vascular devices, implants and grafts, initial encounter: Secondary | ICD-10-CM | POA: Diagnosis not present

## 2016-10-03 DIAGNOSIS — Z992 Dependence on renal dialysis: Secondary | ICD-10-CM

## 2016-10-03 DIAGNOSIS — Z89511 Acquired absence of right leg below knee: Secondary | ICD-10-CM | POA: Diagnosis not present

## 2016-10-03 DIAGNOSIS — N186 End stage renal disease: Secondary | ICD-10-CM

## 2016-10-07 ENCOUNTER — Other Ambulatory Visit: Payer: Self-pay

## 2016-10-07 DIAGNOSIS — I739 Peripheral vascular disease, unspecified: Secondary | ICD-10-CM

## 2016-10-27 ENCOUNTER — Telehealth: Payer: Self-pay | Admitting: Vascular Surgery

## 2016-10-27 NOTE — Telephone Encounter (Signed)
-----   Message from Lujean Amel sent at 10/08/2016  4:31 PM EST ----- Regarding: referral appt [name] w/ Dr.Lee @ [facility] called to let you know that the MD agreed to see this patient. She can call the patient directly to schedule. Her phone # is [phone]. Thanks, Anne Ng

## 2016-10-29 ENCOUNTER — Ambulatory Visit: Payer: Medicare Other | Admitting: "Endocrinology

## 2016-11-11 ENCOUNTER — Ambulatory Visit (INDEPENDENT_AMBULATORY_CARE_PROVIDER_SITE_OTHER): Payer: Medicare Other | Admitting: "Endocrinology

## 2016-11-11 ENCOUNTER — Encounter: Payer: Self-pay | Admitting: "Endocrinology

## 2016-11-11 VITALS — BP 154/66 | HR 84 | Ht 72.0 in | Wt 154.0 lb

## 2016-11-11 DIAGNOSIS — E1122 Type 2 diabetes mellitus with diabetic chronic kidney disease: Secondary | ICD-10-CM | POA: Diagnosis not present

## 2016-11-11 DIAGNOSIS — I10 Essential (primary) hypertension: Secondary | ICD-10-CM

## 2016-11-11 DIAGNOSIS — N186 End stage renal disease: Secondary | ICD-10-CM | POA: Diagnosis not present

## 2016-11-11 DIAGNOSIS — E782 Mixed hyperlipidemia: Secondary | ICD-10-CM

## 2016-11-11 NOTE — Progress Notes (Signed)
Subjective:    Patient ID: Gerald Davenport, male    DOB: 1941-01-29. Patient is being seen in consultation for management of diabetes requested by  Donnie Coffin, MD  Past Medical History:  Diagnosis Date  . Anemia   . Anxiety   . Cancer [facility])    Prostate cancer treated with radiation therapy  . Chronic kidney disease    dialysis Mon, Wednes, Fri  . Clot 05/2006   Arterial clot right leg   . Constipation   . Diabetes mellitus    Type II  . Diverticulosis   . GERD (gastroesophageal reflux disease)   . Hemodialysis patient ([place]) 6/13  . Hyperlipidemia   . Hypertension   . Infection    drainage from distal incision of left upper arm arteriovenous graft  . Positive PPD   . Shortness of breath dyspnea    with exertion, when he has too much fluid  . Steal syndrome dialysis vascular access ([place])   . Thyroid disease    hyperparathyroidism  . Wears glasses    Past Surgical History:  Procedure Laterality Date  . AV FISTULA PLACEMENT  11/04/2010   left and left  . AV FISTULA PLACEMENT  11/20/2011   Procedure: INSERTION OF ARTERIOVENOUS (AV) GORE-TEX GRAFT ARM;  Surgeon: Angelia Mould, MD;  Location: [place];  Service: Vascular;  Laterality: Right;  . AV FISTULA PLACEMENT Right 04/19/2015   Procedure: Excision of infected RUA AVG;  Surgeon: Conrad Coldwater, MD;  Location: [place];  Service: Vascular;  Laterality: Right;  . AV FISTULA PLACEMENT Left 10/04/2015   Procedure: INSERTION OF ARTERIOVENOUS (AV) GORE-TEX GRAFT ARM;  Surgeon: Conrad Gibson, MD;  Location: [place];  Service: Vascular;  Laterality: Left;  . [place] REMOVAL Left 10/13/2015   Procedure: REMOVAL OF INFECTED ARTERIOVENOUS GORETEX LEFT GRAFT ;  Surgeon: Conrad Byron, MD;  Location: [place];  Service: Vascular;  Laterality: Left;  . BASCILIC VEIN TRANSPOSITION Left 06/19/2015   Procedure: FIRST STAGE BRACHIAL VEIN TRANSPOSITION;  Surgeon: Conrad Dows, MD;  Location: [place];  Service: Vascular;  Laterality: Left;  . 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 COLONOSCOPY N/A 11/10/2012   [id number] proctitis/Pancolonic diverticulosis/(TUBULAR ADENOMA)5 mm polyp in the transverse segment/4 mm polyps in the base of the cecum and diminutive polyp in the descending segment. INADEQUATE BOWEL PREP  . COLONOSCOPY N/A 11/28/2013   RMR: Radiation proctitis. Colonic diverticulosis. Colonic polyps-removed as described above.   . INSERTION OF DIALYSIS CATHETER Right 01/19/2016   Procedure: INSERTION OF Right internal jugular  DIALYSIS CATHETER.;  Surgeon: Elam Dutch, MD;  Location: [place];  Service: Vascular;  Laterality: Right;  . PERIPHERAL VASCULAR CATHETERIZATION Bilateral 12/06/2015   Procedure: Upper Extremity Venography/ central venogram;  Surgeon: Conrad Vicksburg, MD;  Location: [place] CV LAB;  Service: Cardiovascular;  Laterality: Bilateral;  . PERIPHERAL VASCULAR CATHETERIZATION N/A 05/29/2016   Procedure: Upper Extremity Angiography;  Surgeon: Conrad Naranjito, MD;  Location: [place] CV LAB;  Service: Cardiovascular;  Laterality: N/A;  . PERIPHERAL VASCULAR CATHETERIZATION N/A 05/29/2016   Procedure: Lower Extremity Angiography;  Surgeon: Conrad Rocky Ford, MD;  Location: [place] CV LAB;  Service: Cardiovascular;  Laterality: N/A;  . PERIPHERAL VASCULAR CATHETERIZATION  05/29/2016   Procedure: Peripheral Vascular Intervention;  Surgeon: Conrad Win[place] Mills, MD;  Location: [place] CV LAB;  Service: Cardiovascular;;  . REMOVAL OF A HERO DEVICE Right 01/19/2016   Procedure: REMOVAL  OF A HERO DEVICE;  Surgeon: Elam Dutch, MD;  Location: [place];  Service: Vascular;  Laterality: Right;  . SHUNTOGRAM N/A 10/08/2012   Procedure: [name];  Surgeon: Elam Dutch, MD;  Location: [facility] CATH LAB;  Service: Cardiovascular;  Laterality: N/A;  . VASCULAR ACCESS DEVICE INSERTION Right 01/01/2016   Procedure: INSERTION OF HERO VASCULAR ACCESS DEVICE - RIGHT;  Surgeon: Conrad Whitten, MD;  Location: [place];  Service: Vascular;   Laterality: Right;   Social History   Social History  . Marital status: Married    Spouse name: N/A  . Number of children: N/A  . Years of education: N/A   Social History Main Topics  . Smoking status: Light Tobacco Smoker    Packs/day: 0.25    Years: 50.00    Types: Cigarettes  . Smokeless tobacco: Never Used     Comment: 4-5 cigarettes daily  . Alcohol use No  . Drug use: No  . Sexual activity: Not Asked   Other Topics Concern  . None   Social History Narrative  . None   Outpatient Encounter Prescriptions as of 11/11/2016  Medication Sig  . buPROPion (WELLBUTRIN XL) 150 MG 24 hr tablet Take 150 mg by mouth daily.  . calcium acetate (PHOSLO) 667 MG capsule Take 667-2,001 mg by mouth See admin instructions. Take 2001 mg by mouth 3 times daily. Take 667 mg by mouth with snacks.  . cloNIDine (CATAPRES) 0.2 MG tablet Take 1 tablet (0.2 mg total) by mouth 3 (three) times daily.  Marland Kitchen doxazosin (CARDURA) 8 MG tablet Take 8 mg by mouth daily. Reported on 02/26/2016  . fluorometholone (FML) 0.1 % ophthalmic suspension Place 1 drop into both eyes at bedtime.  . furosemide (LASIX) 40 MG tablet Take 40 mg by mouth daily.  Marland Kitchen gabapentin (NEURONTIN) 100 MG capsule Take 100 mg by mouth 2 (two) times daily.  . Linaclotide (LINZESS) 145 MCG CAPS capsule Take 1 capsule (145 mcg total) by mouth daily. Take 30 minutes before breakfast  . omeprazole (PRILOSEC) 20 MG capsule Take 20 mg by mouth 2 (two) times daily before a meal.   . oxyCODONE (OXY IR/ROXICODONE) 5 MG immediate release tablet Take 5 mg by mouth every 4 (four) hours as needed for severe pain.  . Polyvinyl Alcohol-Povidone (REFRESH OP) Place 1 drop into both eyes at bedtime.  . psyllium (METAMUCIL) 58.6 % powder Take 1 packet by mouth as needed (for stool).   . QUEtiapine (SEROQUEL) 100 MG tablet Take 200 mg by mouth at bedtime.   . saxagliptin HCl (ONGLYZA) 5 MG TABS tablet Take 5 mg by mouth daily with breakfast.   . SENSIPAR 30 MG  tablet Take 30 mg by mouth daily.   . sevelamer carbonate (RENVELA) 800 MG tablet Take 800-1,600 mg by mouth 3 (three) times daily with meals.  . simvastatin (ZOCOR) 20 MG tablet Take 20 mg by mouth daily.  . [DISCONTINUED] insulin detemir (LEVEMIR) 100 UNIT/ML injection Inject 0.05 mLs (5 Units total) into the skin at bedtime.  Marland Kitchen acetaminophen-codeine (TYLENOL #3) 300-30 MG tablet Take 1 tablet by mouth every 4 (four) hours as needed for moderate pain.   No facility-administered encounter medications on file as of 11/11/2016.    ALLERGIES: No Known Allergies VACCINATION STATUS: Immunization History  Administered Date(s) Administered  . Influenza-Unspecified 06/17/2015    Diabetes  He presents for his initial diabetic visit. He has type 2 diabetes mellitus. Onset time: Patient is unreliable however he says he  was diagnosed at age 54. His disease course has been improving (Patient reports history of significant heavy alcohol use in the past.). Hypoglycemia symptoms include dizziness. Pertinent negatives for hypoglycemia include no confusion, headaches, pallor or seizures. Associated symptoms include fatigue and weight loss. Pertinent negatives for diabetes include no chest pain, no polydipsia, no polyphagia, no polyuria and no weakness. There are no hypoglycemic complications. Symptoms are stable. There are no diabetic complications. 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 ) Risk factors for coronary artery disease include tobacco exposure, sedentary lifestyle, male sex, diabetes mellitus, dyslipidemia, hypertension and family history. Current diabetic treatments: He is on Levemir 5 units and Onglyza 5 mg daily. His weight is decreasing steadily. He is following a generally unhealthy diet. When asked about meal planning, he reported none. He has not had a previous visit with a dietitian. He never participates in exercise. His overall blood glucose  range is 140-180 mg/dl. (His average blood glucose for the last 30 days is 151 (N = 27)) An ACE inhibitor/angiotensin II receptor blocker is not being taken.  Hypertension  This is a chronic problem. The current episode started more than 1 year ago. The problem is controlled. Pertinent negatives include no chest pain, headaches, neck pain, palpitations or shortness of breath. Past treatments include central alpha agonists. The current treatment provides moderate improvement. Hypertensive end-organ damage includes kidney disease.  Hyperlipidemia  This is a chronic problem. The current episode started more than 1 year ago. Pertinent negatives include no chest pain, myalgias or shortness of breath. Current antihyperlipidemic treatment includes statins. Risk factors for coronary artery disease include diabetes mellitus, dyslipidemia, hypertension, male sex and a sedentary lifestyle.       Review of Systems  Constitutional: Positive for fatigue, unexpected weight change and weight loss. Negative for chills and fever.  HENT: Negative for dental problem, mouth sores and trouble swallowing.   Eyes: Negative for visual disturbance.  Respiratory: Positive for cough. Negative for choking, chest tightness and shortness of breath.   Cardiovascular: Negative for chest pain, palpitations and leg swelling.  Gastrointestinal: Negative for abdominal distention, abdominal pain, constipation, diarrhea, nausea and vomiting.  Endocrine: Negative for polydipsia, polyphagia and polyuria.  Genitourinary: Negative for dysuria, flank pain, hematuria and urgency.  Musculoskeletal: Positive for arthralgias. Negative for back pain, gait problem, myalgias and neck pain.  Skin: Negative for pallor, rash and wound.  Neurological: Positive for dizziness and light-headedness. Negative for seizures, syncope, weakness, numbness and headaches.  Psychiatric/Behavioral: Positive for behavioral problems. Negative for confusion.     Objective:    BP (!) 154/66   Pulse 84   Ht 6' (1.829 m)   Wt 154 lb (69.9 kg)   BMI 20.89 kg/m   Wt Readings from Last 3 Encounters:  11/11/16 154 lb (69.9 kg)  10/03/16 152 lb (68.9 kg)  09/09/16 150 lb 2.1 oz (68.1 kg)    Physical Exam  Constitutional: He is oriented to person, place, and time. He is cooperative. No distress.  Chronically sick-looking, emaciated with global loss of skeletal muscle mass.   HENT:  Head: Normocephalic and atraumatic.  Poor dental and oral hygiene.  Eyes: EOM are normal.  Neck: Normal range of motion. Neck supple. No tracheal deviation present. No thyromegaly present.  Cardiovascular: S1 normal and S2 normal.  Exam reveals no gallop.   No murmur heard. Pulses:      Dorsalis pedis pulses are 1+ on the right side, and 1+ on the left  side.       Posterior tibial pulses are 1+ on the right side, and 1+ on the left side.  Distant heart sounds. No murmur no gallop.  Pulmonary/Chest: No respiratory distress. He has wheezes.  Poor breathing efforts  Abdominal: Soft. Bowel sounds are normal. He exhibits no distension. There is no tenderness. There is no guarding and no CVA tenderness.  Musculoskeletal: He exhibits no edema.       Right shoulder: He exhibits no swelling and no deformity.  Global loss of skeletal muscle mass.   Neurological: He is alert and oriented to person, place, and time. He has normal strength. No cranial nerve deficit or sensory deficit. Gait normal.  Skin: Skin is dry. No rash noted. No cyanosis. Nails show no clubbing.  Psychiatric: His speech is normal. Cognition and memory are normal.  Significant gross cognitive deficit.      CMP     Component Value Date/Time   NA 133 (L) 09/09/2016 0539   NA 134 (L) 09/09/2016 0539   K 4.9 09/09/2016 0539   K 4.9 09/09/2016 0539   CL 96 (L) 09/09/2016 0539   CL 98 (L) 09/09/2016 0539   CO2 27 09/09/2016 0539   CO2 26 09/09/2016 0539   GLUCOSE 92 09/09/2016 0539   GLUCOSE  90 09/09/2016 0539   BUN 23 (H) 09/09/2016 0539   BUN 24 (H) 09/09/2016 0539   CREATININE 7.63 (H) 09/09/2016 0539   CREATININE 7.67 (H) 09/09/2016 0539   CREATININE 9.19 (H) 02/06/2015 1206   CALCIUM 8.2 (L) 09/09/2016 0539   CALCIUM 8.1 (L) 09/09/2016 0539   PROT 6.8 09/08/2016 0825   ALBUMIN 2.9 (L) 09/09/2016 0539   AST 12 (L) 09/08/2016 0825   ALT 7 (L) 09/08/2016 0825   ALKPHOS 58 09/08/2016 0825   BILITOT 0.4 09/08/2016 0825   GFRNONAA 6 (L) 09/09/2016 0539   GFRNONAA 6 (L) 09/09/2016 0539   GFRAA 7 (L) 09/09/2016 0539   GFRAA 7 (L) 09/09/2016 0539     Diabetic Labs (most recent): Lab Results  Component Value Date   HGBA1C 5.1 09/08/2016   HGBA1C 6.7 (H) 10/13/2015   HGBA1C 7.7 (H) 04/20/2015     Assessment & Plan:   1. Type 2 diabetes mellitus with ESRD (end-stage renal disease) ([place])   - 76 year old gentleman with complicated medical history including end-stage renal disease on hemodialysis. - He is being seen at the kind request of Dr. Alroy Dust for management of diabetes. -  Patient has currently controlled type ? 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  Recent labs reviewed, Showing end-stage renal disease currently on hemodialysis. - He admits significant chronic alcohol abuse which probably triggered is diabetes. -  He has ESRD from uncontrolled hypertension, he has poor social support and significant mood disorder evident from his medications, chronic active heavy smoking,  possible cognitive deficit,  and patient remains at a high risk for more acute and chronic complications of diabetes which include CAD, CVA, retinopathy, and neuropathy. These are all discussed in detail with the patient.  - I had a long discussion regarding diabetes management with this patient. -  The first priority in this patient will be to avoid hypoglycemia. - Patient's with pancreatic diabetes are at  increased risk of hypoglycemia due to lack of glucagon response from the loss of not only beta cells but also alpha cells due to pancreatic  destruction .  - For this reason, I have advised him to discontinue Levemir. - He agrees to start monitoring blood glucose before meals and at bedtime for the next 7 days. If his glycemic profile is near target, he will also be taken off of Onglyza.  - If therapy is required, he will be considered for properly titrated basal insulin treatment based on his blood glucose readings.  - Patient is advised to stick to a routine mealtimes to eat 3 meals  a day and introduced more starch/carbs ( from potatoes , rice, pasta, and break ) to help him reviewed his body.  - He will return in 1 week with his meter and logs to review.  - He is not a suitable candidate for  incretin therapy, SGLT2i, nor for  metformin.  - Patient specific target  A1c;  LDL, HDL, Triglycerides, and  Waist Circumference were discussed in detail.  2) BP/HTN:  uncontrolled. Continue current medications. He will be considered for low-dose lisinopril if his blood pressure remains above target by next visit. 3) Lipids/HPL:   Control unknown.   Patient is advised tocontinue statins. 4)  Weight/Diet: He would benefit from some weight gain, CDE Consult will be initiated. He is advised to introduce more  complex starch to his diet. See above.  5) Chronic Care/Health Maintenance:  -Patient is on  statin medications and encouraged to continue to follow up with Ophthalmology, nephrology, Podiatrist at least yearly or according to recommendations, and advised to  Quit smoking. I have recommended yearly flu vaccine and pneumonia vaccination at least every 5 years;  and  sleep for at least 7 hours a day.  - 60 minutes of time was spent on the care of this patient , 50% of which was applied for counseling on diabetes complications and their preventions.  - Patient to bring meter and  blood glucose logs  during his next visit.   - I advised patient to maintain close follow up with Donnie Coffin, MD for primary care needs.  Follow up plan: - Return in about 1 week (around 11/18/2016) for follow up with meter and logs- no labs.  Glade Lloyd, MD Phone: [phone]  Fax: [phone]   11/11/2016, 11:41 AM

## 2016-11-19 ENCOUNTER — Ambulatory Visit: Payer: Medicare Other | Admitting: "Endocrinology

## 2016-12-16 ENCOUNTER — Ambulatory Visit: Payer: Medicare Other | Admitting: Nutrition

## 2016-12-29 ENCOUNTER — Encounter (HOSPITAL_COMMUNITY): Payer: Self-pay | Admitting: Emergency Medicine

## 2016-12-29 ENCOUNTER — Emergency Department (HOSPITAL_COMMUNITY)
Admission: EM | Admit: 2016-12-29 | Discharge: 2016-12-29 | Disposition: A | Discharge status: Home / Self Care | Payer: Medicare Other | Attending: Emergency Medicine | Admitting: Emergency Medicine

## 2016-12-29 DIAGNOSIS — D631 Anemia in chronic kidney disease: Secondary | ICD-10-CM | POA: Diagnosis not present

## 2016-12-29 DIAGNOSIS — F17210 Nicotine dependence, cigarettes, uncomplicated: Secondary | ICD-10-CM | POA: Diagnosis not present

## 2016-12-29 DIAGNOSIS — I120 Hypertensive chronic kidney disease with stage 5 chronic kidney disease or end stage renal disease: Secondary | ICD-10-CM | POA: Diagnosis not present

## 2016-12-29 DIAGNOSIS — Z8546 Personal history of malignant neoplasm of prostate: Secondary | ICD-10-CM | POA: Diagnosis not present

## 2016-12-29 DIAGNOSIS — R799 Abnormal finding of blood chemistry, unspecified: Secondary | ICD-10-CM | POA: Diagnosis present

## 2016-12-29 DIAGNOSIS — N186 End stage renal disease: Secondary | ICD-10-CM | POA: Diagnosis not present

## 2016-12-29 DIAGNOSIS — E1122 Type 2 diabetes mellitus with diabetic chronic kidney disease: Secondary | ICD-10-CM | POA: Diagnosis not present

## 2016-12-29 DIAGNOSIS — Z794 Long term (current) use of insulin: Secondary | ICD-10-CM | POA: Insufficient documentation

## 2016-12-29 DIAGNOSIS — Z992 Dependence on renal dialysis: Secondary | ICD-10-CM | POA: Insufficient documentation

## 2016-12-29 LAB — CBC WITH DIFFERENTIAL/PLATELET
BASOS ABS: 0 10*3/uL (ref 0.0–0.1)
Basophils Relative: 0 %
EOS ABS: 0.2 10*3/uL (ref 0.0–0.7)
EOS PCT: 3 %
HCT: 24.4 % — ABNORMAL LOW (ref 39.0–52.0)
HEMOGLOBIN: 8.1 g/dL — AB (ref 13.0–17.0)
LYMPHS ABS: 0.7 10*3/uL (ref 0.7–4.0)
LYMPHS PCT: 9 %
MCH: 34.3 pg — AB (ref 26.0–34.0)
MCHC: 33.2 g/dL (ref 30.0–36.0)
MCV: 103.4 fL — AB (ref 78.0–100.0)
Monocytes Absolute: 0.5 10*3/uL (ref 0.1–1.0)
Monocytes Relative: 8 %
Neutro Abs: 5.8 10*3/uL (ref 1.7–7.7)
Neutrophils Relative %: 80 %
PLATELETS: 225 10*3/uL (ref 150–400)
RBC: 2.36 MIL/uL — AB (ref 4.22–5.81)
RDW: 17.5 % — ABNORMAL HIGH (ref 11.5–15.5)
WBC: 7.2 10*3/uL (ref 4.0–10.5)

## 2016-12-29 LAB — COMPREHENSIVE METABOLIC PANEL
ALK PHOS: 63 U/L (ref 38–126)
ALT: 8 U/L — AB (ref 17–63)
AST: 15 U/L (ref 15–41)
Albumin: 3.1 g/dL — ABNORMAL LOW (ref 3.5–5.0)
Anion gap: 9 (ref 5–15)
BUN: 13 mg/dL (ref 6–20)
CALCIUM: 8.2 mg/dL — AB (ref 8.9–10.3)
CHLORIDE: 94 mmol/L — AB (ref 101–111)
CO2: 33 mmol/L — AB (ref 22–32)
CREATININE: 4.38 mg/dL — AB (ref 0.61–1.24)
GFR calc Af Amer: 14 mL/min — ABNORMAL LOW (ref >60)
GFR calc non Af Amer: 12 mL/min — ABNORMAL LOW (ref >60)
Glucose, Bld: 206 mg/dL — ABNORMAL HIGH (ref 65–99)
Potassium: 3.7 mmol/L (ref 3.5–5.1)
SODIUM: 136 mmol/L (ref 135–145)
Total Bilirubin: 0.4 mg/dL (ref 0.3–1.2)
Total Protein: 7.3 g/dL (ref 6.5–8.1)

## 2016-12-29 NOTE — ED Triage Notes (Signed)
Pt reports sent by dialysis for blood transfusion.  Just feels weak and tired, but no specific complaint.  Unsure what hgb was today, but did finish his tx.

## 2016-12-29 NOTE — ED Provider Notes (Addendum)
[facility] DEPT Provider Note   CSN: [id number] Arrival date & time: 12/29/16  1145     History   Chief Complaint Chief Complaint  Patient presents with  . Abnormal Lab    HPI Gerald Davenport is a 76 y.o. male.  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 He states that they asked him if he was short of breath that he told them that he was. He also states this is his baseline dyspnea. He denies any recent bleeding including rectal bleeding. Denies being lightheaded or passing out. He denies chest pain.  Past Medical History:  Diagnosis Date  . Anemia   . Anxiety   . Cancer [facility])    Prostate cancer treated with radiation therapy  . Chronic kidney disease    dialysis Mon, Wednes, Fri  . Clot 05/2006   Arterial clot right leg   . Constipation   . Diabetes mellitus    Type II  . Diverticulosis   . GERD (gastroesophageal reflux disease)   . Hemodialysis patient ([place]) 6/13  . Hyperlipidemia   . Hypertension   . Infection    drainage from distal incision of left upper arm arteriovenous graft  . Positive PPD   . Shortness of breath dyspnea    with exertion, when he has too much fluid  . Steal syndrome dialysis vascular access ([place])   . Thyroid disease    hyperparathyroidism  . Wears glasses     Patient Active Problem List   Diagnosis Date Noted  . Confusion 09/08/2016  . Hypoglycemia   . Atherosclerosis of native arteries of extremity with intermittent claudication ([place]) 05/23/2016  . Steal syndrome of dialysis vascular access ([place]) 01/18/2016  . Infection 10/13/2015  . Infection of arteriovenous graft for hemodialysis ([place]) 10/12/2015  . ESRD on dialysis ([place]) 06/15/2015  . Diabetic neuropathy associated with  type 2 diabetes mellitus ([place]) 05/25/2015  . Infection, dialysis vascular access ([place]) 04/19/2015  . Rectal bleeding 02/06/2015  . SOB (shortness of breath) 03/24/2014  . Abnormal EKG 03/24/2014  . Essential hypertension, benign 03/15/2014  . Mixed hyperlipidemia 03/15/2014  . H/O adenomatous polyp of colon 11/07/2013  . Constipation 06/15/2013  . Encounter for screening colonoscopy 11/02/2012  . Fecal incontinence 09/10/2012  . GERD (gastroesophageal reflux disease) 09/10/2012  . Type 2 diabetes mellitus with ESRD (end-stage renal disease) ([place]) 08/06/2011  . CRF (chronic renal failure) 08/06/2011    Past Surgical History:  Procedure Laterality Date  . AV FISTULA PLACEMENT  11/04/2010   left and left  . AV FISTULA PLACEMENT  11/20/2011   Procedure: INSERTION OF ARTERIOVENOUS (AV) GORE-TEX GRAFT ARM;  Surgeon: Angelia Mould, MD;  Location: [place];  Service: Vascular;  Laterality: Right;  . AV FISTULA PLACEMENT Right 04/19/2015   Procedure: Excision of infected RUA AVG;  Surgeon: Conrad Wilton Manors, MD;  Location: [place];  Service: Vascular;  Laterality: Right;  . AV FISTULA PLACEMENT Left 10/04/2015   Procedure: INSERTION OF ARTERIOVENOUS (AV) GORE-TEX GRAFT ARM;  Surgeon: Conrad [place], MD;  Location: [place];  Service: Vascular;  Laterality: Left;  . [place] REMOVAL Left 10/13/2015   Procedure: REMOVAL OF INFECTED ARTERIOVENOUS GORETEX LEFT GRAFT ;  Surgeon: Conrad O[place]iedo, MD;  [facility];  Service: Vascular;  Laterality: Left;  . BASCILIC VEIN TRANSPOSITION Left 06/19/2015   Procedure: FIRST STAGE BRACHIAL VEIN TRANSPOSITION;  Surgeon: Conrad [place], MD;  Location: [place];  Service: Vascular;  Laterality: Left;  . 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 COLONOSCOPY N/A 11/10/2012   [id number] proctitis/Pancolonic diverticulosis/(TUBULAR ADENOMA)5 mm polyp in the transverse segment/4 mm polyps in the base of the cecum and diminutive polyp in the descending segment. INADEQUATE  BOWEL PREP  . COLONOSCOPY N/A 11/28/2013   RMR: Radiation proctitis. Colonic diverticulosis. Colonic polyps-removed as described above.   . INSERTION OF DIALYSIS CATHETER Right 01/19/2016   Procedure: INSERTION OF Right internal jugular  DIALYSIS CATHETER.;  Surgeon: Elam Dutch, MD;  Location: [place];  Service: Vascular;  Laterality: Right;  . PERIPHERAL VASCULAR CATHETERIZATION Bilateral 12/06/2015   Procedure: Upper Extremity Venography/ central venogram;  Surgeon: Conrad [place], MD;  Location: [place] CV LAB;  Service: Cardiovascular;  Laterality: Bilateral;  . PERIPHERAL VASCULAR CATHETERIZATION N/A 05/29/2016   Procedure: Upper Extremity Angiography;  Surgeon: Conrad [place], MD;  Location: [place] CV LAB;  Service: Cardiovascular;  Laterality: N/A;  . PERIPHERAL VASCULAR CATHETERIZATION N/A 05/29/2016   Procedure: Lower Extremity Angiography;  Surgeon: Conrad [place], MD;  Location: [place] CV LAB;  Service: Cardiovascular;  Laterality: N/A;  . PERIPHERAL VASCULAR CATHETERIZATION  05/29/2016   Procedure: Peripheral Vascular Intervention;  Surgeon: Conrad Rossiter, MD;  Location: [place] CV LAB;  Service: Cardiovascular;;  . REMOVAL OF A HERO DEVICE Right 01/19/2016   Procedure: REMOVAL OF A HERO DEVICE;  Surgeon: Elam Dutch, MD;  Location: [place];  Service: Vascular;  Laterality: Right;  . SHUNTOGRAM N/A 10/08/2012   Procedure: [name];  Surgeon: Elam Dutch, MD;  Location: [facility] CATH LAB;  Service: Cardiovascular;  Laterality: N/A;  . VASCULAR ACCESS DEVICE INSERTION Right 01/01/2016   Procedure: INSERTION OF HERO VASCULAR ACCESS DEVICE - RIGHT;  Surgeon: Conrad [place], MD;  Location: [place];  Service: Vascular;  Laterality: Right;       Home Medications    Prior to Admission medications   Medication Sig Start Date End Date Taking? Authorizing Provider  acetaminophen-codeine (TYLENOL #3) 300-30 MG tablet Take 1 tablet by mouth every 4 (four) hours as needed for moderate  pain. 01/19/16   Elam Dutch, MD  buPROPion (WELLBUTRIN XL) 150 MG 24 hr tablet Take 150 mg by mouth daily. 03/05/15   Historical Provider, MD  calcium acetate (PHOSLO) 667 MG capsule Take 667-2,001 mg by mouth See admin instructions. Take 2001 mg by mouth 3 times daily. Take 667 mg by mouth with snacks. 10/26/12   Historical Provider, MD  cloNIDine (CATAPRES) 0.2 MG tablet Take 1 tablet (0.2 mg total) by mouth 3 (three) times daily. 01/19/16   [name], MD  doxazosin (CARDURA) 8 MG tablet Take 8 mg by mouth daily. Reported on 02/26/2016    Historical Provider, MD  fluorometholone (FML) 0.1 % ophthalmic suspension Place 1 drop into both eyes at bedtime. 04/15/16   Historical Provider, MD  furosemide (LASIX) 40 MG tablet Take 40 mg by mouth daily.    Historical Provider, MD  gabapentin (NEURONTIN) 100 MG capsule Take 100 mg by mouth 2 (two) times daily. 04/13/15   Historical Provider, MD  Linaclotide Rolan Lipa) 145 MCG CAPS capsule Take 1 capsule (145 mcg total) by mouth daily. Take 30 minutes before breakfast 02/06/15   Carlis Stable,  NP  omeprazole (PRILOSEC) 20 MG capsule Take 20 mg by mouth 2 (two) times daily before a meal.  02/20/15   Historical Provider, MD  oxyCODONE (OXY IR/ROXICODONE) 5 MG immediate release tablet Take 5 mg by mouth every 4 (four) hours as needed for severe pain.    Historical Provider, MD  Polyvinyl Alcohol-Povidone (REFRESH OP) Place 1 drop into both eyes at bedtime.    Historical Provider, MD  psyllium (METAMUCIL) 58.6 % powder Take 1 packet by mouth as needed (for stool).     Historical Provider, MD  QUEtiapine (SEROQUEL) 100 MG tablet Take 200 mg by mouth at bedtime.  03/05/15   Historical Provider, MD  saxagliptin HCl (ONGLYZA) 5 MG TABS tablet Take 5 mg by mouth daily with breakfast.     Historical Provider, MD  SENSIPAR 30 MG tablet Take 30 mg by mouth daily.  11/20/15   Historical Provider, MD  sevelamer carbonate (RENVELA) 800 MG tablet Take 800-1,600 mg by mouth 3 (three)  times daily with meals.    Historical Provider, MD  simvastatin (ZOCOR) 20 MG tablet Take 20 mg by mouth daily.    Historical Provider, MD    Family History Family History  Problem Relation Age of Onset  . Heart disease Mother   . Diabetes Mother   . Hypertension Mother   . Stroke Mother   . Heart disease Father   . Diabetes Father   . Heart disease Sister   . Diabetes Brother   . Colon cancer Neg Hx     Social History Social History  Substance Use Topics  . Smoking status: Light Tobacco Smoker    Packs/day: 0.25    Years: 50.00    Types: Cigarettes  . Smokeless tobacco: Never Used     Comment: 4-5 cigarettes daily  . Alcohol use No     Allergies   Patient has no known allergies.   Review of Systems Review of Systems  All other systems reviewed and are negative.    Physical Exam Updated Vital Signs BP (!) 155/69 (BP Location: Right Arm)   Pulse (!) 101   Temp 98.3 F (36.8 C) (Oral)   Resp 18   Ht 5\' 9"  (1.753 m)   Wt 70.3 kg   SpO2 100%   BMI 22.89 kg/m   Physical Exam  Constitutional: He appears well-developed.  Chronically ill-appearing male in no acute distress  HENT:  Head: Normocephalic and atraumatic.  Right Ear: External ear normal.  Left Ear: External ear normal.  Nose: Nose normal.  Mouth/Throat: Oropharynx is clear and moist.  Eyes: Pupils are equal, round, and reactive to light.  Neck: Normal range of motion. Neck supple.  Cardiovascular: Normal rate and regular rhythm.   Pulmonary/Chest: Effort normal and breath sounds normal.  Abdominal: Soft. Bowel sounds are normal.  Genitourinary: Rectal exam shows guaiac negative stool.  Musculoskeletal: He exhibits no edema.  Right BKA  Skin: Skin is warm and dry. Capillary refill takes less than 2 seconds.  Psychiatric: He has a normal mood and affect.  Nursing note and vitals reviewed.    ED Treatments / Results  Labs (all labs ordered are listed, but only abnormal results are  displayed) Labs Reviewed  CBC WITH DIFFERENTIAL/PLATELET - Abnormal; Notable for the following:       Result Value   RBC 2.36 (*)    Hemoglobin 8.1 (*)    HCT 24.4 (*)    MCV 103.4 (*)    MCH 34.3 (*)  RDW 17.5 (*)    All other components within normal limits  COMPREHENSIVE METABOLIC PANEL - Abnormal; Notable for the following:    Chloride 94 (*)    CO2 33 (*)    Glucose, Bld 206 (*)    Creatinine, Ser 4.38 (*)    Calcium 8.2 (*)    Albumin 3.1 (*)    ALT 8 (*)    GFR calc non Af Amer 12 (*)    GFR calc Af Amer 14 (*)    All other components within normal limits   Procedures Procedures (including critical care time)  Medications Ordered in ED Medications - No data to display   Initial Impression / Assessment and Plan / ED Course  I have reviewed the triage vital signs and the nursing notes.  Pertinent labs & imaging results that were available during my care of the patient were reviewed by me and considered in my medical decision making (see chart for details).    Patient is hemodynamically stable here and is in fact hypertensive. I discussed the patient's care with Dr. Posey Pronto on call for nephrology. There is not appear to be any clear indication for transfusion at this time. I discussed this the patient and his wife. We have discussed return precautions including increased dyspnea, lightheadedness, chest pain or active bleeding. Your hemoglobin is 8.1. You do not appear to have any active bleeding You appear to be hemodynamically stable. Return if you become more short of breath, have chest pain, or evidence of bleeding. Otherwise follow-up with your doctors as previously scheduled and continue your regular dialysis.  Final Clinical Impressions(s) / ED Diagnoses   Final diagnoses:  ESRD (end stage renal disease) ([place])  Anemia in chronic kidney disease, on chronic dialysis [facility])    New Prescriptions New Prescriptions   No medications on file     Pattricia Boss,  MD 12/29/16 1807    Pattricia Boss, MD 12/29/16 0762

## 2016-12-29 NOTE — Discharge Instructions (Signed)
Your hemoglobin is 8.1. You do not appear to have any active bleeding You appear to be hemodynamically stable. Return if you become more short of breath, have chest pain, or evidence of bleeding. Otherwise follow-up with your doctors as previously scheduled and continue your regular dialysis.

## 2016-12-29 NOTE — ED Notes (Signed)
Occult blood test resulted negative

## 2016-12-30 ENCOUNTER — Ambulatory Visit: Payer: Medicare Other | Admitting: Podiatry

## 2016-12-31 ENCOUNTER — Ambulatory Visit (INDEPENDENT_AMBULATORY_CARE_PROVIDER_SITE_OTHER): Payer: Medicare Other | Admitting: Podiatry

## 2016-12-31 DIAGNOSIS — L84 Corns and callosities: Secondary | ICD-10-CM

## 2016-12-31 DIAGNOSIS — B351 Tinea unguium: Secondary | ICD-10-CM | POA: Diagnosis not present

## 2016-12-31 DIAGNOSIS — Z89511 Acquired absence of right leg below knee: Secondary | ICD-10-CM

## 2016-12-31 DIAGNOSIS — E1142 Type 2 diabetes mellitus with diabetic polyneuropathy: Secondary | ICD-10-CM

## 2016-12-31 DIAGNOSIS — Z8679 Personal history of other diseases of the circulatory system: Secondary | ICD-10-CM

## 2016-12-31 NOTE — Progress Notes (Signed)
Complaint:  Visit Type: Patient returns to my office for continued preventative foot care services. Complaint: Patient states" my nails have grown long and thick and become painful to walk and wear shoes" Patient has been diagnosed with DM with angiopathy and neuropathy.. The patient presents for preventative foot care services. No changes to ROS.  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  Podiatric Exam: Vascular: dorsalis pedis and posterior tibial pulses are not  palpable left. Capillary return is immediate. Temperature gradient is WNL. Skin turgor WNL  Sensorium: Diminished Semmes Weinstein monofilament test. Normal tactile sensation bilaterally. Nail Exam: Pt has thick disfigured discolored nails with subungual debris noted left entire nail hallux through fifth toenails Ulcer Exam: There is no evidence of ulcer or pre-ulcerative changes or infection. Orthopedic Exam: Muscle tone and strength are WNL. No limitations in general ROM. No crepitus or effusions noted. Foot type and digits show no abnormalities. Bony prominences are unremarkable. Skin: Diffuse plantar tyloma sub 5th met left foot.  Diagnosis:  Onychomycosis, , Pain in right toe, pain in left toes  Treatment & Plan Procedures and Treatment: Consent by patient was obtained for treatment procedures. The patient understood the discussion of treatment and procedures well. All questions were answered thoroughly reviewed. Debridement of mycotic and hypertrophic toenails, 1 through 5 bilateral and clearing of subungual debris. No ulceration, no infection noted. Debridement of plantar tyloma left foot. The dorsalis was called to talk to this patient to see if we can help pad his left foot Return Visit-Office Procedure: Patient instructed to return to the office for a follow up visit 10 weeks for continued evaluation and treatment.    Gardiner Barefoot DPM

## 2017-01-01 ENCOUNTER — Encounter: Payer: Self-pay | Admitting: Vascular Surgery

## 2017-01-08 NOTE — Progress Notes (Deleted)
Established Intermittent Claudication  History of Present Illness  Gerald Davenport is a 76 y.o. (1941-06-17) male w/ history of BUE PAD who presents and s/p R BKA with chief complaint: ***.  I referred the patient to a [facility] for revascularization of his R hand.    The patient's symptoms have *** progressed.  The patient's symptoms are: ***.  The patient's treatment regimen currently included: maximal medical management and ***walking plan.    ***The patient notes his L leg intermittent claudication is minimal and not lifestyle limiting at this point. The patient is able to complete their activities of daily living.   The patient's PMH, PSH, and SH, and FamHx are unchanged from 10/03/16.  Current Outpatient Prescriptions  Medication Sig Dispense Refill  . acetaminophen-codeine (TYLENOL #3) 300-30 MG tablet Take 1 tablet by mouth every 4 (four) hours as needed for moderate pain. 10 tablet 0  . buPROPion (WELLBUTRIN XL) 150 MG 24 hr tablet Take 150 mg by mouth daily.    . calcium acetate (PHOSLO) 667 MG capsule Take 667-2,001 mg by mouth See admin instructions. Take 2001 mg by mouth 3 times daily. Take 667 mg by mouth with snacks.    . cloNIDine (CATAPRES) 0.2 MG tablet Take 1 tablet (0.2 mg total) by mouth 3 (three) times daily. 42 tablet 0  . doxazosin (CARDURA) 8 MG tablet Take 8 mg by mouth daily. Reported on 02/26/2016    . fluorometholone (FML) 0.1 % ophthalmic suspension Place 1 drop into both eyes at bedtime.    . furosemide (LASIX) 40 MG tablet Take 40 mg by mouth 2 (two) times daily.     Marland Kitchen gabapentin (NEURONTIN) 100 MG capsule Take 100 mg by mouth 2 (two) times daily.    . Insulin Detemir (LEVEMIR FLEXTOUCH) 100 UNIT/ML Pen Inject 20 Units into the skin daily at 10 pm.    . Linaclotide (LINZESS) 145 MCG CAPS capsule Take 1 capsule (145 mcg total) by mouth daily. Take 30 minutes before breakfast 30 capsule 11  . omeprazole (PRILOSEC) 20 MG capsule Take 20 mg by mouth  daily.     Marland Kitchen oxyCODONE (OXY IR/ROXICODONE) 5 MG immediate release tablet Take 5 mg by mouth every 4 (four) hours as needed for severe pain.    . Polyvinyl Alcohol-Povidone (REFRESH OP) Place 1 drop into both eyes at bedtime.    . psyllium (METAMUCIL) 58.6 % powder Take 1 packet by mouth as needed (for stool).     . QUEtiapine (SEROQUEL) 100 MG tablet Take 200 mg by mouth at bedtime.     . saxagliptin HCl (ONGLYZA) 5 MG TABS tablet Take 5 mg by mouth daily with breakfast.     . SENSIPAR 30 MG tablet Take 30 mg by mouth daily.     . sevelamer carbonate (RENVELA) 800 MG tablet Take 800-1,600 mg by mouth 3 (three) times daily with meals.    . simvastatin (ZOCOR) 20 MG tablet Take 20 mg by mouth daily.     No current facility-administered medications for this visit.     No Known Allergies  On ROS today: ***, ***.   ***Physical Examination  There were no vitals filed for this visit.  There is no height or weight on file to calculate BMI.  General: {LOC:19197::"Somulent","Alert"}, {Orientation:19197::"Confused","O x 3"}, {Weight:19197::"Obese","Cachectic","WD"},{General state of health:19197::"Ill appearing","NAD"}  Pulmonary: {Chest wall:19197::"Asx chest movement","Sym exp"}, {Air movt:19197::"Decreased *** air movt","good B air movt"},{BS:19197::"rales on ***","rhonchi on ***","wheezing on ***","CTA B"}  Cardiac: {Rhythm:19197::"Irregularly,  irregular rate and rhythm","RRR, Nl S1, S2"}, {Murmur:19197::"Murmur present: ***","no Murmurs"}, {Rubs:19197::"Rub present: ***","No rubs"}, {Gallop:19197::"Gallop present: ***","No S3,S4"}  Vascular: Vessel Right Left  Radial {Palpable:19197::"Not palpable","Faintly palpable","Palpable"} {Palpable:19197::"Not palpable","Faintly palpable","Palpable"}  Brachial {Palpable:19197::"Not palpable","Faintly palpable","Palpable"} {Palpable:19197::"Not palpable","Faintly palpable","Palpable"}  Carotid Palpable, {Bruit:19197::"Bruit present","No Bruit"}  Palpable, {Bruit:19197::"Bruit present","No Bruit"}  Aorta Not palpable N/A  Femoral {Palpable:19197::"Not palpable","Faintly palpable","Palpable"} {Palpable:19197::"Not palpable","Faintly palpable","Palpable"}  Popliteal {Palpable:19197::"Prominently palpable","Not palpable"} {Palpable:19197::"Prominently palpable","Not palpable"}  PT {Palpable:19197::"Not palpable","Faintly palpable","Palpable"} {Palpable:19197::"Not palpable","Faintly palpable","Palpable"}  DP {Palpable:19197::"Not palpable","Faintly palpable","Palpable"} {Palpable:19197::"Not palpable","Faintly palpable","Palpable"}   Gastrointestinal: soft, {Distension:19197::"distended","non-distended"}, {TTP:19197::"TTP in *** quadrant","appropriate tenderness to palpation","non-tender to palpation"}, {Guarding:19197::"Guarding and rebound in *** quadrant","No guarding or rebound"}, {HSM:19197::"HSM present","no HSM"}, {Masses:19197::"Mass present: ***","no masses"}, {Flank:19197::"CVAT on ***","Flank bruit present on ***","no CVAT B"}, {AAA:19197::"Palpable prominent aortic pulse","No palpable prominent aortic pulse"}, {Surgical incision:19197::"Surg incisions: ***","Surgical incisions well healed"," "}  Musculoskeletal: M/S 5/5 throughout {MS:19197::"except ***"," "}, Extremities without ischemic changes {MS:19197::"except ***"," "}, {Edema:19197::"Edema in *** legs","No edema present"}, {Varicosities:19197::"Varicosities present: ***"," "}, {LDS:19197::"LDS present: *** leg","No LDS present"}  Neurologic: CN 2-12 intact{CN:19197::"except ***"," "}, Pain and light touch intact in extremities{CN:19197::"except ***"," "}, Motor exam as listed above   Non-Invasive Vascular Imaging ABI (Date: 01/08/2017)  R:   ABI: *** (***),   DP: {Signals:19197::"none","mono","bi","tri"}  PT: {Signals:19197::"none","mono","bi","tri"}  TBI:  ***  L:   ABI: *** (***),   DP: {Signals:19197::"none","mono","bi","tri"}  PT:  {Signals:19197::"none","mono","bi","tri"}  TBI: ***  Aortoiliac Duplex (Date: 01/08/2017)  Ao: *** c/s  R iliac: *** c/s  L iliac: *** c/s   Medical Decision Making  Gerald Davenport is a 76 y.o. male who presents with:  s/p PTA+S R CIA, LLE multi-level PAD, R arm digital and radial disease, s/p R BKA   Based on the patient's vascular studies and examination, I have offered the patient: ***.  I discussed in depth with the patient the nature of atherosclerosis, and emphasized the importance of maximal medical management including strict control of blood pressure, blood glucose, and lipid levels, antiplatelet agents, obtaining regular exercise, and cessation of smoking.    The patient is aware that without maximal medical management the underlying atherosclerotic disease process will progress, limiting the benefit of any interventions. The patient is currently on a statin: Zocor.  The patient is currently on an anti-platelet: ASA.  Thank you for allowing [place] to participate in this patient's care.   Adele Barthel, MD, FACS [facility] Office: [phone] Pager: [phone]

## 2017-01-09 ENCOUNTER — Ambulatory Visit: Payer: Medicare Other | Admitting: Vascular Surgery

## 2017-01-09 ENCOUNTER — Ambulatory Visit (HOSPITAL_COMMUNITY): Payer: Medicare Other | Attending: Vascular Surgery

## 2017-01-14 ENCOUNTER — Ambulatory Visit: Payer: Medicare Other | Admitting: Nurse Practitioner

## 2017-01-16 NOTE — Addendum Note (Signed)
Addended by: Lianne Cure A on: 01/16/2017 09:30 AM   Modules accepted: Orders

## 2017-01-18 ENCOUNTER — Emergency Department (HOSPITAL_COMMUNITY): Payer: Medicare Other

## 2017-01-18 ENCOUNTER — Inpatient Hospital Stay (HOSPITAL_COMMUNITY)
Admission: EM | Admit: 2017-01-18 | Discharge: 2017-01-19 | DRG: 640 | Disposition: A | Discharge status: Home / Self Care | Payer: Medicare Other | Attending: Internal Medicine | Admitting: Internal Medicine

## 2017-01-18 ENCOUNTER — Encounter (HOSPITAL_COMMUNITY): Payer: Self-pay | Admitting: *Deleted

## 2017-01-18 DIAGNOSIS — D649 Anemia, unspecified: Secondary | ICD-10-CM | POA: Diagnosis present

## 2017-01-18 DIAGNOSIS — N186 End stage renal disease: Secondary | ICD-10-CM | POA: Diagnosis present

## 2017-01-18 DIAGNOSIS — R000 Tachycardia, unspecified: Secondary | ICD-10-CM | POA: Diagnosis present

## 2017-01-18 DIAGNOSIS — F17210 Nicotine dependence, cigarettes, uncomplicated: Secondary | ICD-10-CM | POA: Diagnosis present

## 2017-01-18 DIAGNOSIS — Z992 Dependence on renal dialysis: Secondary | ICD-10-CM

## 2017-01-18 DIAGNOSIS — Z923 Personal history of irradiation: Secondary | ICD-10-CM | POA: Diagnosis not present

## 2017-01-18 DIAGNOSIS — I1311 Hypertensive heart and chronic kidney disease without heart failure, with stage 5 chronic kidney disease, or end stage renal disease: Secondary | ICD-10-CM | POA: Diagnosis present

## 2017-01-18 DIAGNOSIS — J811 Chronic pulmonary edema: Secondary | ICD-10-CM | POA: Diagnosis present

## 2017-01-18 DIAGNOSIS — E782 Mixed hyperlipidemia: Secondary | ICD-10-CM | POA: Diagnosis present

## 2017-01-18 DIAGNOSIS — E8770 Fluid overload, unspecified: Principal | ICD-10-CM | POA: Diagnosis present

## 2017-01-18 DIAGNOSIS — Z833 Family history of diabetes mellitus: Secondary | ICD-10-CM | POA: Diagnosis not present

## 2017-01-18 DIAGNOSIS — K219 Gastro-esophageal reflux disease without esophagitis: Secondary | ICD-10-CM | POA: Diagnosis present

## 2017-01-18 DIAGNOSIS — J9601 Acute respiratory failure with hypoxia: Secondary | ICD-10-CM | POA: Diagnosis present

## 2017-01-18 DIAGNOSIS — F419 Anxiety disorder, unspecified: Secondary | ICD-10-CM | POA: Diagnosis present

## 2017-01-18 DIAGNOSIS — E1140 Type 2 diabetes mellitus with diabetic neuropathy, unspecified: Secondary | ICD-10-CM | POA: Diagnosis present

## 2017-01-18 DIAGNOSIS — Z8546 Personal history of malignant neoplasm of prostate: Secondary | ICD-10-CM | POA: Diagnosis not present

## 2017-01-18 DIAGNOSIS — I4581 Long QT syndrome: Secondary | ICD-10-CM | POA: Diagnosis present

## 2017-01-18 DIAGNOSIS — I10 Essential (primary) hypertension: Secondary | ICD-10-CM

## 2017-01-18 DIAGNOSIS — E1122 Type 2 diabetes mellitus with diabetic chronic kidney disease: Secondary | ICD-10-CM | POA: Diagnosis present

## 2017-01-18 DIAGNOSIS — R0602 Shortness of breath: Secondary | ICD-10-CM | POA: Diagnosis present

## 2017-01-18 DIAGNOSIS — Z823 Family history of stroke: Secondary | ICD-10-CM | POA: Diagnosis not present

## 2017-01-18 DIAGNOSIS — M898X9 Other specified disorders of bone, unspecified site: Secondary | ICD-10-CM | POA: Diagnosis present

## 2017-01-18 DIAGNOSIS — I160 Hypertensive urgency: Secondary | ICD-10-CM | POA: Diagnosis present

## 2017-01-18 DIAGNOSIS — Z8249 Family history of ischemic heart disease and other diseases of the circulatory system: Secondary | ICD-10-CM

## 2017-01-18 DIAGNOSIS — Z794 Long term (current) use of insulin: Secondary | ICD-10-CM | POA: Diagnosis not present

## 2017-01-18 LAB — GLUCOSE, CAPILLARY
Glucose-Capillary: 129 mg/dL — ABNORMAL HIGH (ref 65–99)
Glucose-Capillary: 144 mg/dL — ABNORMAL HIGH (ref 65–99)
Glucose-Capillary: 147 mg/dL — ABNORMAL HIGH (ref 65–99)

## 2017-01-18 LAB — CBC WITH DIFFERENTIAL/PLATELET
BASOS PCT: 0 %
Basophils Absolute: 0 10*3/uL (ref 0.0–0.1)
Eosinophils Absolute: 0.3 10*3/uL (ref 0.0–0.7)
Eosinophils Relative: 2 %
HEMATOCRIT: 32.7 % — AB (ref 39.0–52.0)
HEMOGLOBIN: 10.6 g/dL — AB (ref 13.0–17.0)
LYMPHS PCT: 14 %
Lymphs Abs: 2 10*3/uL (ref 0.7–4.0)
MCH: 35.3 pg — ABNORMAL HIGH (ref 26.0–34.0)
MCHC: 32.4 g/dL (ref 30.0–36.0)
MCV: 109 fL — AB (ref 78.0–100.0)
MONO ABS: 0.8 10*3/uL (ref 0.1–1.0)
MONOS PCT: 5 %
NEUTROS ABS: 11.2 10*3/uL — AB (ref 1.7–7.7)
NEUTROS PCT: 79 %
Platelets: 258 10*3/uL (ref 150–400)
RBC: 3 MIL/uL — ABNORMAL LOW (ref 4.22–5.81)
RDW: 18.4 % — ABNORMAL HIGH (ref 11.5–15.5)
WBC: 14.3 10*3/uL — ABNORMAL HIGH (ref 4.0–10.5)

## 2017-01-18 LAB — COMPREHENSIVE METABOLIC PANEL
ALBUMIN: 3.6 g/dL (ref 3.5–5.0)
ALK PHOS: 70 U/L (ref 38–126)
ALT: 8 U/L — ABNORMAL LOW (ref 17–63)
ANION GAP: 13 (ref 5–15)
AST: 14 U/L — ABNORMAL LOW (ref 15–41)
BILIRUBIN TOTAL: 0.5 mg/dL (ref 0.3–1.2)
BUN: 24 mg/dL — ABNORMAL HIGH (ref 6–20)
CHLORIDE: 94 mmol/L — AB (ref 101–111)
CO2: 28 mmol/L (ref 22–32)
Calcium: 8.5 mg/dL — ABNORMAL LOW (ref 8.9–10.3)
Creatinine, Ser: 7.87 mg/dL — ABNORMAL HIGH (ref 0.61–1.24)
GFR calc Af Amer: 7 mL/min — ABNORMAL LOW (ref >60)
GFR, EST NON AFRICAN AMERICAN: 6 mL/min — AB (ref >60)
GLUCOSE: 177 mg/dL — AB (ref 65–99)
Potassium: 4 mmol/L (ref 3.5–5.1)
Sodium: 135 mmol/L (ref 135–145)
Total Protein: 7.8 g/dL (ref 6.5–8.1)

## 2017-01-18 LAB — TROPONIN I
Troponin I: 0.08 ng/mL (ref <0.03)
Troponin I: 0.08 ng/mL (ref <0.03)

## 2017-01-18 LAB — TSH: TSH: 0.973 u[IU]/mL (ref 0.350–4.500)

## 2017-01-18 LAB — MRSA PCR SCREENING: MRSA by PCR: NEGATIVE

## 2017-01-18 MED ORDER — LIDOCAINE-PRILOCAINE 2.5-2.5 % EX CREA
1.0000 | TOPICAL_CREAM | CUTANEOUS | Status: DC | PRN
Start: 2017-01-18 — End: 2017-01-19
  Filled 2017-01-18: qty 5

## 2017-01-18 MED ORDER — IPRATROPIUM BROMIDE 0.02 % IN SOLN
0.5000 mg | Freq: Once | RESPIRATORY_TRACT | Status: AC
  Administered 2017-01-18: 0.5 mg via RESPIRATORY_TRACT
  Filled 2017-01-18: qty 2.5

## 2017-01-18 MED ORDER — LINACLOTIDE 145 MCG PO CAPS
145.0000 ug | ORAL_CAPSULE | Freq: Every day | ORAL | Status: DC
  Administered 2017-01-19: 145 ug via ORAL
  Filled 2017-01-18 (×3): qty 1

## 2017-01-18 MED ORDER — FLUOROMETHOLONE 0.1 % OP SUSP
1.0000 [drp] | Freq: Every day | OPHTHALMIC | Status: DC
Start: 2017-01-18 — End: 2017-01-19
  Administered 2017-01-18: 1 [drp] via OPHTHALMIC
  Filled 2017-01-18: qty 5

## 2017-01-18 MED ORDER — PANTOPRAZOLE SODIUM 40 MG PO TBEC
40.0000 mg | DELAYED_RELEASE_TABLET | Freq: Every day | ORAL | Status: DC
  Administered 2017-01-18 – 2017-01-19 (×2): 40 mg via ORAL
  Filled 2017-01-18 (×2): qty 1

## 2017-01-18 MED ORDER — NITROGLYCERIN 2 % TD OINT
1.0000 [in_us] | TOPICAL_OINTMENT | Freq: Once | TRANSDERMAL | Status: AC
  Administered 2017-01-18: 1 [in_us] via TOPICAL
  Filled 2017-01-18: qty 1

## 2017-01-18 MED ORDER — NEPRO/CARBSTEADY PO LIQD: 237.0000 mL | Freq: Two times a day (BID) | ORAL | Status: DC

## 2017-01-18 MED ORDER — DOXAZOSIN MESYLATE 2 MG PO TABS
8.0000 mg | ORAL_TABLET | Freq: Every day | ORAL | Status: DC
  Administered 2017-01-18: 8 mg via ORAL
  Filled 2017-01-18: qty 4

## 2017-01-18 MED ORDER — SODIUM CHLORIDE 0.9 % IV SOLN: 100.0000 mL | INTRAVENOUS | Status: DC | PRN

## 2017-01-18 MED ORDER — SODIUM CHLORIDE 0.9 % IV SOLN: 250.0000 mL | INTRAVENOUS | Status: DC | PRN

## 2017-01-18 MED ORDER — PENTAFLUOROPROP-TETRAFLUOROETH EX AERO
1.0000 "application " | INHALATION_SPRAY | CUTANEOUS | Status: DC | PRN
  Filled 2017-01-18: qty 30

## 2017-01-18 MED ORDER — ACETAMINOPHEN 325 MG PO TABS: 650.0000 mg | ORAL_TABLET | Freq: Four times a day (QID) | ORAL | Status: DC | PRN

## 2017-01-18 MED ORDER — EPOETIN ALFA 3000 UNIT/ML IJ SOLN
3000.0000 [IU] | INTRAMUSCULAR | Status: DC
  Filled 2017-01-18: qty 1

## 2017-01-18 MED ORDER — LIDOCAINE HCL (PF) 1 % IJ SOLN: 5.0000 mL | INTRAMUSCULAR | Status: DC | PRN

## 2017-01-18 MED ORDER — QUETIAPINE FUMARATE 100 MG PO TABS
200.0000 mg | ORAL_TABLET | Freq: Every day | ORAL | Status: DC
  Administered 2017-01-18: 200 mg via ORAL
  Filled 2017-01-18 (×3): qty 2

## 2017-01-18 MED ORDER — ENOXAPARIN SODIUM 30 MG/0.3ML ~~LOC~~ SOLN
30.0000 mg | SUBCUTANEOUS | Status: DC
  Administered 2017-01-18: 30 mg via SUBCUTANEOUS
  Filled 2017-01-18: qty 0.3

## 2017-01-18 MED ORDER — LEVALBUTEROL HCL 1.25 MG/0.5ML IN NEBU
INHALATION_SOLUTION | RESPIRATORY_TRACT | Status: AC
  Administered 2017-01-18: 1.25 mg
  Filled 2017-01-18: qty 0.5

## 2017-01-18 MED ORDER — SODIUM CHLORIDE 0.9% FLUSH: 3.0000 mL | INTRAVENOUS | Status: DC | PRN

## 2017-01-18 MED ORDER — INSULIN DETEMIR 100 UNIT/ML ~~LOC~~ SOLN
20.0000 [IU] | Freq: Every day | SUBCUTANEOUS | Status: DC
  Administered 2017-01-18: 20 [IU] via SUBCUTANEOUS
  Filled 2017-01-18 (×2): qty 0.2

## 2017-01-18 MED ORDER — ONDANSETRON HCL 4 MG/2ML IJ SOLN: 4.0000 mg | Freq: Four times a day (QID) | INTRAMUSCULAR | Status: DC | PRN

## 2017-01-18 MED ORDER — CINACALCET HCL 30 MG PO TABS
30.0000 mg | ORAL_TABLET | Freq: Every day | ORAL | Status: DC
  Administered 2017-01-19: 30 mg via ORAL
  Filled 2017-01-18 (×3): qty 1

## 2017-01-18 MED ORDER — CALCIUM ACETATE (PHOS BINDER) 667 MG PO CAPS
2001.0000 mg | ORAL_CAPSULE | Freq: Three times a day (TID) | ORAL | Status: DC
  Administered 2017-01-18 – 2017-01-19 (×2): 2001 mg via ORAL
  Filled 2017-01-18 (×2): qty 3

## 2017-01-18 MED ORDER — SODIUM CHLORIDE 0.9% FLUSH
3.0000 mL | Freq: Two times a day (BID) | INTRAVENOUS | Status: DC
  Administered 2017-01-18 – 2017-01-19 (×2): 3 mL via INTRAVENOUS

## 2017-01-18 MED ORDER — ACETAMINOPHEN 650 MG RE SUPP: 650.0000 mg | Freq: Four times a day (QID) | RECTAL | Status: DC | PRN

## 2017-01-18 MED ORDER — SIMVASTATIN 20 MG PO TABS
20.0000 mg | ORAL_TABLET | Freq: Every day | ORAL | Status: DC
  Administered 2017-01-18: 20 mg via ORAL
  Filled 2017-01-18: qty 1

## 2017-01-18 MED ORDER — NICARDIPINE HCL IN NACL 20-0.86 MG/200ML-% IV SOLN
3.0000 mg/h | Freq: Once | INTRAVENOUS | Status: AC
  Administered 2017-01-18: 5 mg/h via INTRAVENOUS
  Filled 2017-01-18: qty 200

## 2017-01-18 MED ORDER — BUPROPION HCL ER (XL) 150 MG PO TB24
150.0000 mg | ORAL_TABLET | Freq: Every day | ORAL | Status: DC
  Administered 2017-01-19: 150 mg via ORAL
  Filled 2017-01-18 (×4): qty 1

## 2017-01-18 MED ORDER — HEPARIN SODIUM (PORCINE) 1000 UNIT/ML IJ SOLN
INTRAMUSCULAR | Status: AC
  Administered 2017-01-18: 1000 [IU] via INTRAVENOUS_CENTRAL
  Filled 2017-01-18: qty 1

## 2017-01-18 MED ORDER — ALTEPLASE 2 MG IJ SOLR
2.0000 mg | Freq: Once | INTRAMUSCULAR | Status: DC | PRN
  Filled 2017-01-18: qty 2

## 2017-01-18 MED ORDER — CLONIDINE HCL 0.2 MG PO TABS
0.2000 mg | ORAL_TABLET | Freq: Three times a day (TID) | ORAL | Status: DC
Start: 2017-01-18 — End: 2017-01-19
  Administered 2017-01-18 – 2017-01-19 (×3): 0.2 mg via ORAL
  Filled 2017-01-18 (×3): qty 1

## 2017-01-18 MED ORDER — LINAGLIPTIN 5 MG PO TABS
5.0000 mg | ORAL_TABLET | Freq: Every day | ORAL | Status: DC
  Administered 2017-01-18 – 2017-01-19 (×2): 5 mg via ORAL
  Filled 2017-01-18 (×2): qty 1

## 2017-01-18 MED ORDER — FUROSEMIDE 40 MG PO TABS
40.0000 mg | ORAL_TABLET | Freq: Two times a day (BID) | ORAL | Status: DC
  Administered 2017-01-18 – 2017-01-19 (×2): 40 mg via ORAL
  Filled 2017-01-18 (×2): qty 1

## 2017-01-18 MED ORDER — HEPARIN SODIUM (PORCINE) 1000 UNIT/ML DIALYSIS
1000.0000 [IU] | INTRAMUSCULAR | Status: DC | PRN
  Administered 2017-01-18: 1000 [IU] via INTRAVENOUS_CENTRAL
  Filled 2017-01-18 (×2): qty 1

## 2017-01-18 MED ORDER — GABAPENTIN 100 MG PO CAPS
100.0000 mg | ORAL_CAPSULE | Freq: Two times a day (BID) | ORAL | Status: DC
  Administered 2017-01-18 – 2017-01-19 (×2): 100 mg via ORAL
  Filled 2017-01-18 (×2): qty 1

## 2017-01-18 MED ORDER — SEVELAMER CARBONATE 800 MG PO TABS
1600.0000 mg | ORAL_TABLET | Freq: Three times a day (TID) | ORAL | Status: DC
  Administered 2017-01-19: 1600 mg via ORAL
  Filled 2017-01-18: qty 2

## 2017-01-18 MED ORDER — SENNOSIDES-DOCUSATE SODIUM 8.6-50 MG PO TABS: 1.0000 | ORAL_TABLET | Freq: Every evening | ORAL | Status: DC | PRN

## 2017-01-18 MED ORDER — LEVALBUTEROL HCL 0.63 MG/3ML IN NEBU
0.6300 mg | INHALATION_SOLUTION | Freq: Once | RESPIRATORY_TRACT | Status: AC
  Administered 2017-01-18: 0.63 mg via RESPIRATORY_TRACT
  Filled 2017-01-18: qty 3

## 2017-01-18 MED ORDER — ONDANSETRON HCL 4 MG PO TABS: 4.0000 mg | ORAL_TABLET | Freq: Four times a day (QID) | ORAL | Status: DC | PRN

## 2017-01-18 NOTE — Consult Note (Signed)
Reason for Consult: End-stage renal disease and fluid overload Referring Physician: Dr. Kathryne Eriksson is an 76 y.o. male.  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 Patient was dialyzed on Friday and he is due for Monday. Since he came to the emergency room patient was found to have significantly elevated blood pressure. When his blood pressure was brought down patient started improving. Presently patient claims his feeling better. He denies any cough, fever, sweating. Patient also denies any nausea or vomiting. Her consult is called for dialysis.  Past Medical History:  Diagnosis Date  . Anemia   . Anxiety   . Cancer [facility])    Prostate cancer treated with radiation therapy  . Chronic kidney disease    dialysis Mon, Wednes, Fri  . Clot 05/2006   Arterial clot right leg   . Constipation   . Diabetes mellitus    Type II  . Diverticulosis   . GERD (gastroesophageal reflux disease)   . Hemodialysis patient ([place]) 6/13  . Hyperlipidemia   . Hypertension   . Infection    drainage from distal incision of left upper arm arteriovenous graft  . Positive PPD   . Shortness of breath dyspnea    with exertion, when he has too much fluid  . Steal syndrome dialysis vascular access ([place])   . Thyroid disease    hyperparathyroidism  . Wears glasses     Past Surgical History:  Procedure Laterality Date  . AV FISTULA PLACEMENT  11/04/2010   left and left  . AV FISTULA PLACEMENT  11/20/2011   Procedure: INSERTION OF ARTERIOVENOUS (AV) GORE-TEX GRAFT ARM;  Surgeon: Angelia Mould, MD;  Location: [place];  Service: Vascular;  Laterality: Right;  . AV FISTULA PLACEMENT Right 04/19/2015   Procedure: Excision of infected RUA AVG;  Surgeon: Conrad Montpelier, MD;  Location: [place];  Service: Vascular;  Laterality: Right;  . AV FISTULA  PLACEMENT Left 10/04/2015   Procedure: INSERTION OF ARTERIOVENOUS (AV) GORE-TEX GRAFT ARM;  Surgeon: Conrad Dix, MD;  Location: [place];  Service: Vascular;  Laterality: Left;  . [place] REMOVAL Left 10/13/2015   Procedure: REMOVAL OF INFECTED ARTERIOVENOUS GORETEX LEFT GRAFT ;  Surgeon: Conrad [place], MD;  Location: [place];  Service: Vascular;  Laterality: Left;  . BASCILIC VEIN TRANSPOSITION Left 06/19/2015   Procedure: FIRST STAGE BRACHIAL VEIN TRANSPOSITION;  Surgeon: Conrad [place], MD;  Location: [place];  Service: Vascular;  Laterality: Left;  . 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 COLONOSCOPY N/A 11/10/2012   LFY:[id number] proctitis/Pancolonic diverticulosis/(TUBULAR ADENOMA)5 mm polyp in the transverse segment/4 mm polyps in the base of the cecum and diminutive polyp in the descending segment. INADEQUATE BOWEL PREP  . COLONOSCOPY N/A 11/28/2013   RMR: Radiation proctitis. Colonic diverticulosis. Colonic polyps-removed as described above.   . INSERTION OF DIALYSIS CATHETER Right 01/19/2016   Procedure: INSERTION OF Right internal jugular  DIALYSIS CATHETER.;  Surgeon: Elam Dutch, MD;  Location: [place];  Service: Vascular;  Laterality: Right;  . PERIPHERAL VASCULAR CATHETERIZATION Bilateral 12/06/2015   Procedure: Upper Extremity Venography/ central venogram;  Surgeon: Conrad [place], MD;  Location: [place] CV LAB;  Service: Cardiovascular;  Laterality: Bilateral;  . PERIPHERAL VASCULAR CATHETERIZATION N/A 05/29/2016   Procedure: Upper Extremity Angiography;  Surgeon: Conrad , MD;  Location:  [place] INVASIVE CV LAB;  Service: Cardiovascular;  Laterality: N/A;  . PERIPHERAL VASCULAR CATHETERIZATION N/A 05/29/2016   Procedure: Lower Extremity Angiography;  Surgeon: Conrad [place], MD;  Location: [place] CV LAB;  Service: Cardiovascular;  Laterality: N/A;  . PERIPHERAL VASCULAR CATHETERIZATION  05/29/2016   Procedure: Peripheral Vascular Intervention;  Surgeon: Conrad [place], MD;  Location:  [place] CV LAB;  Service: Cardiovascular;;  . REMOVAL OF A HERO DEVICE Right 01/19/2016   Procedure: REMOVAL OF A HERO DEVICE;  Surgeon: Elam Dutch, MD;  Location: [place];  Service: Vascular;  Laterality: Right;  . SHUNTOGRAM N/A 10/08/2012   Procedure: [name];  Surgeon: Elam Dutch, MD;  Location: [facility] C[facility] LAB;  Service: Cardiovascular;  Laterality: N/A;  . VASCULAR ACCESS DEVICE INSERTION Right 01/01/2016   Procedure: INSERTION OF HERO VASCULAR ACCESS DEVICE - RIGHT;  Surgeon: Conrad [place], MD;  Location: [place];  Service: Vascular;  Laterality: Right;    Family History  Problem Relation Age of Onset  . Heart disease Mother   . Diabetes Mother   . Hypertension Mother   . Stroke Mother   . Heart disease Father   . Diabetes Father   . Heart disease Sister   . Diabetes Brother   . Colon cancer Neg Hx     Social History:  reports that he has been smoking Cigarettes.  He has a 12.50 pack-year smoking history. He has never used smokeless tobacco. He reports that he does not drink alcohol or use drugs.  Allergies: No Known Allergies  Medications: I have reviewed the patient's current medications.  Results for orders placed or performed during the hospital encounter of 01/18/17 (from the past 48 hour(s))  CBC with Differential     Status: Abnormal   Collection Time: 01/18/17  5:00 AM  Result Value Ref Range   WBC 14.3 (H) 4.0 - 10.5 K/uL   RBC 3.00 (L) 4.22 - 5.81 MIL/uL   Hemoglobin 10.6 (L) 13.0 - 17.0 g/dL   HCT 32.7 (L) 39.0 - 52.0 %   MCV 109.0 (H) 78.0 - 100.0 fL   MCH 35.3 (H) 26.0 - 34.0 pg   MCHC 32.4 30.0 - 36.0 g/dL   RDW 18.4 (H) 11.5 - 15.5 %   Platelets 258 150 - 400 K/uL   Neutrophils Relative % 79 %   Neutro Abs 11.2 (H) 1.7 - 7.7 K/uL   Lymphocytes Relative 14 %   Lymphs Abs 2.0 0.7 - 4.0 K/uL   Monocytes Relative 5 %   Monocytes Absolute 0.8 0.1 - 1.0 K/uL   Eosinophils Relative 2 %   Eosinophils Absolute 0.3 0.0 - 0.7 K/uL   Basophils Relative  0 %   Basophils Absolute 0.0 0.0 - 0.1 K/uL  Comprehensive metabolic panel     Status: Abnormal   Collection Time: 01/18/17  5:00 AM  Result Value Ref Range   Sodium 135 135 - 145 mmol/L   Potassium 4.0 3.5 - 5.1 mmol/L   Chloride 94 (L) 101 - 111 mmol/L   CO2 28 22 - 32 mmol/L   Glucose, Bld 177 (H) 65 - 99 mg/dL   BUN 24 (H) 6 - 20 mg/dL   Creatinine, Ser 7.87 (H) 0.61 - 1.24 mg/dL   Calcium 8.5 (L) 8.9 - 10.3 mg/dL   Total Protein 7.8 6.5 - 8.1 g/dL   Albumin 3.6 3.5 - 5.0 g/dL   AST 14 (L) 15 - 41 U/L   ALT 8 (L)  17 - 63 U/L   Alkaline Phosphatase 70 38 - 126 U/L   Total Bilirubin 0.5 0.3 - 1.2 mg/dL   GFR calc non Af Amer 6 (L) >60 mL/min   GFR calc Af Amer 7 (L) >60 mL/min    Comment: (NOTE) The eGFR has been calculated using the CKD EPI equation. This calculation has not been validated in all clinical situations. eGFR's persistently <60 mL/min signify possible Chronic Kidney Disease.    Anion gap 13 5 - 15    Dg Chest Port 1 View  Result Date: 01/18/2017 CLINICAL DATA:  Acute onset of shortness of breath. Initial encounter. EXAM: PORTABLE CHEST 1 VIEW COMPARISON:  Chest radiograph from 09/08/2016 FINDINGS: The lungs are well-aerated. Vascular congestion is noted. Increased interstitial markings raise concern for pulmonary edema. Trace right-sided pleural fluid is noted. There is no evidence of pneumothorax. The cardiomediastinal silhouette is borderline enlarged. A right-sided dual-lumen catheter is noted ending about the distal SVC. No acute osseous abnormalities are seen. IMPRESSION: Vascular congestion and borderline cardiomegaly. Increased interstitial markings raise concern for pulmonary edema. Trace right-sided pleural fluid noted. Electronically Signed   By: Garald Balding M.D.   On: 01/18/2017 05:45    Review of Systems  Constitutional: Negative for chills and fever.  Respiratory: Positive for shortness of breath. Negative for cough and hemoptysis.    Cardiovascular: Positive for orthopnea. Negative for chest pain.  Gastrointestinal: Negative for abdominal pain, nausea and vomiting.   Blood pressure (!) 181/90, pulse 85, temperature 97.7 F (36.5 C), resp. rate 18, height '5\' 9"'$  (1.753 m), weight 70.3 kg (155 lb), SpO2 98 %. Physical Exam  Constitutional: No distress.  HENT:  Mouth/Throat: No oropharyngeal exudate.  Neck: No JVD present.  Cardiovascular: Normal rate and regular rhythm.   Respiratory: No respiratory distress. He has no wheezes. He has rales.  Decrease breath soundbilateally  GI: He exhibits no distension. There is no tenderness.  Musculoskeletal: He exhibits no edema.    Assessment/Plan: Problem #1 difficulty breathing: Presently patient is feeling better. Most likely secondary to flash pulmonary edema. Chest x-ray with some vascular congestion and cardiomegaly. Doesn't seem to be significantly different from his previous x-ray Problem #2 end-stage renal disease: Presently he doesn't have any uremic signs and symptoms Problem #3 hypertension: His blood pressure seems to be somewhat better Problem #4 anemia: His hemoglobin is within our target goal Problem # 5 history of diabetes Problem # 6 history of anxiety disorder Problem #7 history of prostate CA status post radiation treatment Plan: 1]We'll do dialysis today for 3-1/2 hours 2]We'll try to remove 3 L if his blood pressure tolerates 3] patient advised to decrease his salt and fluid intake 4] we'll check his renal panel and CBC in the morning  Candy Ziegler S 01/18/2017, 11:28 AM

## 2017-01-18 NOTE — ED Notes (Signed)
hospitalist in to assess  

## 2017-01-18 NOTE — ED Triage Notes (Signed)
Pt c/o sob that woke him up from sleep, pulse ox on room air 79%, accessory muscle usage noted,

## 2017-01-18 NOTE — ED Notes (Signed)
151/60 blood pressure

## 2017-01-18 NOTE — ED Notes (Signed)
Cardene drip stopped due to patient's blood pressure 146/59

## 2017-01-18 NOTE — ED Provider Notes (Signed)
[place] DEPT Provider Note   CSN: [id number] Arrival date & time: 01/18/17  0439  Time seen 05:00 AM   History   Chief Complaint Chief Complaint  Patient presents with  . Shortness of Breath    HPI Gerald Davenport is a 76 y.o. male.  HPI  patient has end-stage renal disease and gets dialysis on Monday, Wednesday, and Friday. His last dialysis was on April 20. He states he woke up acutely short of breath at 1 AM this morning. He states his chest felt tight and had pressure. Has had a mild cough that is dry. No fever. Nurses report his initial pulse ox was 745 on RA.    PCP Donnie Coffin, MD Nephrology Dr [place]orene [place].  Past Medical History:  Diagnosis Date  . Anemia   . Anxiety   . Cancer [facility])    Prostate cancer treated with radiation therapy  . Chronic kidney disease    dialysis Mon, Wednes, Fri  . Clot 05/2006   Arterial clot right leg   . Constipation   . Diabetes mellitus    Type II  . Diverticulosis   . GERD (gastroesophageal reflux disease)   . Hemodialysis patient ([place]) 6/13  . Hyperlipidemia   . Hypertension   . Infection    drainage from distal incision of left upper arm arteriovenous graft  . Positive PPD   . Shortness of breath dyspnea    with exertion, when he has too much fluid  . 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 Thyroid disease    hyperparathyroidism  . Wears glasses     Patient Active Problem List   Diagnosis Date Noted  . Confusion 09/08/2016  . Hypoglycemia   . Atherosclerosis of native arteries of extremity with intermittent claudication ([place]) 05/23/2016  . 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 Infection 10/13/2015  . Infection of arteriovenous graft for hemodialysis ([place]) 10/12/2015  . ESRD on dialysis ([place]) 06/15/2015  . Diabetic neuropathy associated with type 2 diabetes mellitus ([place]) 05/25/2015  . Infection, dialysis vascular access ([place]) 04/19/2015  . Rectal bleeding 02/06/2015  . SOB  (shortness of breath) 03/24/2014  . Abnormal EKG 03/24/2014  . Essential hypertension, benign 03/15/2014  . Mixed hyperlipidemia 03/15/2014  . H/O adenomatous polyp of colon 11/07/2013  . Constipation 06/15/2013  . Encounter for screening colonoscopy 11/02/2012  . Fecal incontinence 09/10/2012  . GERD (gastroesophageal reflux disease) 09/10/2012  . Type 2 diabetes mellitus with ESRD (end-stage renal disease) ([place]) 08/06/2011  . CRF (chronic renal failure) 08/06/2011    Past Surgical History:  Procedure Laterality Date  . AV FISTULA PLACEMENT  11/04/2010   left and left  . AV FISTULA PLACEMENT  11/20/2011   Procedure: INSERTION OF ARTERIOVENOUS (AV) GORE-TEX GRAFT ARM;  Surgeon: Angelia Mould, MD;  Location: [place];  Service: Vascular;  Laterality: Right;  . AV FISTULA PLACEMENT Right 04/19/2015   Procedure: Excision of infected RUA AVG;  Surgeon: Conrad [place], MD;  Location: [place];  Service: Vascular;  Laterality: Right;  . AV FISTULA PLACEMENT Left 10/04/2015   Procedure: INSERTION OF ARTERIOVENOUS (AV) GORE-TEX GRAFT ARM;  Surgeon: Conrad Estancia, MD;  Location: [place];  Service: Vascular;  Laterality: Left;  . [place] REMOVAL Left 10/13/2015   Procedure: REMOVAL OF INFECTED ARTERIOVENOUS GORETEX LEFT GRAFT ;  Surgeon: Conrad Maine, MD;  Location: [place];  Service: Vascular;  Laterality: Left;  . BASCILIC VEIN TRANSPOSITION Left 06/19/2015  Procedure: FIRST STAGE BRACHIAL VEIN TRANSPOSITION;  Surgeon: Conrad Belfry, MD;  Location: [place];  Service: Vascular;  Laterality: Left;  . 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 COLONOSCOPY N/A 11/10/2012   [id number] proctitis/Pancolonic diverticulosis/(TUBULAR ADENOMA)5 mm polyp in the transverse segment/4 mm polyps in the base of the cecum and diminutive polyp in the descending segment. INADEQUATE BOWEL PREP  . COLONOSCOPY N/A 11/28/2013   RMR: Radiation proctitis. Colonic diverticulosis. Colonic polyps-removed as described above.   .  INSERTION OF DIALYSIS CATHETER Right 01/19/2016   Procedure: INSERTION OF Right internal jugular  DIALYSIS CATHETER.;  Surgeon: Elam Dutch, MD;  Location: [place];  Service: Vascular;  Laterality: Right;  . PERIPHERAL VASCULAR CATHETERIZATION Bilateral 12/06/2015   Procedure: Upper Extremity Venography/ central venogram;  Surgeon: Conrad [place], MD;  Location: [place] CV LAB;  Service: Cardiovascular;  Laterality: Bilateral;  . PERIPHERAL VASCULAR CATHETERIZATION N/A 05/29/2016   Procedure: Upper Extremity Angiography;  Surgeon: Conrad [place], MD;  Location: [place] CV LAB;  Service: Cardiovascular;  Laterality: N/A;  . PERIPHERAL VASCULAR CATHETERIZATION N/A 05/29/2016   Procedure: Lower Extremity Angiography;  Surgeon: Conrad [place], MD;  Location: [place] CV LAB;  Service: Cardiovascular;  Laterality: N/A;  . PERIPHERAL VASCULAR CATHETERIZATION  05/29/2016   Procedure: Peripheral Vascular Intervention;  Surgeon: Conrad Webster, MD;  Location: [place] CV LAB;  Service: Cardiovascular;;  . REMOVAL OF A HERO DEVICE Right 01/19/2016   Procedure: REMOVAL OF A HERO DEVICE;  Surgeon: Elam Dutch, MD;  Location: [place];  Service: Vascular;  Laterality: Right;  . SHUNTOGRAM N/A 10/08/2012   Procedure: [name];  Surgeon: Elam Dutch, MD;  Location: [facility] CATH LAB;  Service: Cardiovascular;  Laterality: N/A;  . VASCULAR ACCESS DEVICE INSERTION Right 01/01/2016   Procedure: INSERTION OF HERO VASCULAR ACCESS DEVICE - RIGHT;  Surgeon: Conrad , MD;  Location: [place];  Service: Vascular;  Laterality: Right;       Home Medications    Prior to Admission medications   Medication Sig Start Date End Date Taking? Authorizing Provider  acetaminophen-codeine (TYLENOL #3) 300-30 MG tablet Take 1 tablet by mouth every 4 (four) hours as needed for moderate pain. 01/19/16   Elam Dutch, MD  buPROPion (WELLBUTRIN XL) 150 MG 24 hr tablet Take 150 mg by mouth daily. 03/05/15   Historical Provider,  MD  calcium acetate (PHOSLO) 667 MG capsule Take 667-2,001 mg by mouth See admin instructions. Take 2001 mg by mouth 3 times daily. Take 667 mg by mouth with snacks. 10/26/12   Historical Provider, MD  cloNIDine (CATAPRES) 0.2 MG tablet Take 1 tablet (0.2 mg total) by mouth 3 (three) times daily. 01/19/16   [name], MD  doxazosin (CARDURA) 8 MG tablet Take 8 mg by mouth daily. Reported on 02/26/2016    Historical Provider, MD  fluorometholone (FML) 0.1 % ophthalmic suspension Place 1 drop into both eyes at bedtime. 04/15/16   Historical Provider, MD  furosemide (LASIX) 40 MG tablet Take 40 mg by mouth 2 (two) times daily.     Historical Provider, MD  gabapentin (NEURONTIN) 100 MG capsule Take 100 mg by mouth 2 (two) times daily. 04/13/15   Historical Provider, MD  Insulin Detemir (LEVEMIR FLEXTOUCH) 100 UNIT/ML Pen Inject 20 Units into the skin daily at 10 pm.    Historical Provider, MD  Linaclotide (LINZESS) 145 MCG CAPS capsule Take 1 capsule (145 mcg total) by mouth daily.  Take 30 minutes before breakfast 02/06/15   Carlis Stable, NP  omeprazole (PRILOSEC) 20 MG capsule Take 20 mg by mouth daily.  02/20/15   Historical Provider, MD  oxyCODONE (OXY IR/ROXICODONE) 5 MG immediate release tablet Take 5 mg by mouth every 4 (four) hours as needed for severe pain.    Historical Provider, MD  Polyvinyl Alcohol-Povidone (REFRESH OP) Place 1 drop into both eyes at bedtime.    Historical Provider, MD  psyllium (METAMUCIL) 58.6 % powder Take 1 packet by mouth as needed (for stool).     Historical Provider, MD  QUEtiapine (SEROQUEL) 100 MG tablet Take 200 mg by mouth at bedtime.  03/05/15   Historical Provider, MD  saxagliptin HCl (ONGLYZA) 5 MG TABS tablet Take 5 mg by mouth daily with breakfast.     Historical Provider, MD  SENSIPAR 30 MG tablet Take 30 mg by mouth daily.  11/20/15   Historical Provider, MD  sevelamer carbonate (RENVELA) 800 MG tablet Take 800-1,600 mg by mouth 3 (three) times daily with meals.     Historical Provider, MD  simvastatin (ZOCOR) 20 MG tablet Take 20 mg by mouth daily.    Historical Provider, MD    Family History Family History  Problem Relation Age of Onset  . Heart disease Mother   . Diabetes Mother   . Hypertension Mother   . Stroke Mother   . Heart disease Father   . Diabetes Father   . Heart disease Sister   . Diabetes Brother   . Colon cancer Neg Hx     Social History Social History  Substance Use Topics  . Smoking status: Light Tobacco Smoker    Packs/day: 0.25    Years: 50.00    Types: Cigarettes  . Smokeless tobacco: Never Used     Comment: 4-5 cigarettes daily  . Alcohol use No     Allergies   Patient has no known allergies.   Review of Systems Review of Systems  All other systems reviewed and are negative.    Physical Exam Updated Vital Signs ED Triage Vitals  Enc Vitals Group     BP 01/18/17 0449 (!) 236/106     Pulse Rate 01/18/17 0445 (!) 130     Resp 01/18/17 0449 (!) 30     Temp 01/18/17 0449 97.7 F (36.5 C)     Temp src --      SpO2 01/18/17 0445 (!) 75 %     Weight 01/18/17 0447 155 lb (70.3 kg)     Height 01/18/17 0447 5\' 9"  (1.753 m)     Head Circumference --      Peak Flow --      Pain Score 01/18/17 0447 0     Pain Loc --      Pain Edu? --      Excl. in [place]? --      Vital signs normal except for hypertension   Physical Exam  Constitutional: He is oriented to person, place, and time. He appears well-developed and well-nourished.  Non-toxic appearance. He does not appear ill. No distress.  HENT:  Head: Normocephalic and atraumatic.  Right Ear: External ear normal.  Left Ear: External ear normal.  Nose: Nose normal. No mucosal edema or rhinorrhea.  Mouth/Throat: Oropharynx is clear and moist and mucous membranes are normal. No dental abscesses or uvula swelling.  Eyes: Conjunctivae and EOM are normal. Pupils are equal, round, and reactive to light.  Neck: Normal range of motion and full  passive range of  motion without pain. Neck supple.  Cardiovascular: Normal rate, regular rhythm and normal heart sounds.  Exam reveals no gallop and no friction rub.   No murmur heard. Pulmonary/Chest: Accessory muscle usage present. Tachypnea noted. He is in respiratory distress. He has decreased breath sounds. He has no wheezes. He has rhonchi. He has rales. He exhibits no tenderness and no crepitus.  Abdominal: Soft. Normal appearance and bowel sounds are normal. He exhibits no distension. There is no tenderness. There is no rebound and no guarding.  Musculoskeletal: Normal range of motion. He exhibits no edema or tenderness.  Moves all extremities well.  S/P Rt BKA  Neurological: He is alert and oriented to person, place, and time. He has normal strength. No cranial nerve deficit.  Skin: Skin is warm, dry and intact. No rash noted. No erythema. No pallor.  Psychiatric: He has a normal mood and affect. His speech is normal and behavior is normal. His mood appears not anxious.  Nursing note and vitals reviewed.    ED Treatments / Results  Labs (all labs ordered are listed, but only abnormal results are displayed) Results for orders placed or performed during the hospital encounter of 01/18/17  CBC with Differential  Result Value Ref Range   WBC 14.3 (H) 4.0 - 10.5 K/uL   RBC 3.00 (L) 4.22 - 5.81 MIL/uL   Hemoglobin 10.6 (L) 13.0 - 17.0 g/dL   HCT 32.7 (L) 39.0 - 52.0 %   MCV 109.0 (H) 78.0 - 100.0 fL   MCH 35.3 (H) 26.0 - 34.0 pg   MCHC 32.4 30.0 - 36.0 g/dL   RDW 18.4 (H) 11.5 - 15.5 %   Platelets 258 150 - 400 K/uL   Neutrophils Relative % 79 %   Neutro Abs 11.2 (H) 1.7 - 7.7 K/uL   Lymphocytes Relative 14 %   Lymphs Abs 2.0 0.7 - 4.0 K/uL   Monocytes Relative 5 %   Monocytes Absolute 0.8 0.1 - 1.0 K/uL   Eosinophils Relative 2 %   Eosinophils Absolute 0.3 0.0 - 0.7 K/uL   Basophils Relative 0 %   Basophils Absolute 0.0 0.0 - 0.1 K/uL  Comprehensive metabolic panel  Result Value Ref Range    Sodium 135 135 - 145 mmol/L   Potassium 4.0 3.5 - 5.1 mmol/L   Chloride 94 (L) 101 - 111 mmol/L   CO2 28 22 - 32 mmol/L   Glucose, Bld 177 (H) 65 - 99 mg/dL   BUN 24 (H) 6 - 20 mg/dL   Creatinine, Ser 7.87 (H) 0.61 - 1.24 mg/dL   Calcium 8.5 (L) 8.9 - 10.3 mg/dL   Total Protein 7.8 6.5 - 8.1 g/dL   Albumin 3.6 3.5 - 5.0 g/dL   AST 14 (L) 15 - 41 U/L   ALT 8 (L) 17 - 63 U/L   Alkaline Phosphatase 70 38 - 126 U/L   Total Bilirubin 0.5 0.3 - 1.2 mg/dL   GFR calc non Af Amer 6 (L) >60 mL/min   GFR calc Af Amer 7 (L) >60 mL/min   Anion gap 13 5 - 15   Laboratory interpretation all normal except leukocytosis, anemia, CRF    EKG  EKG Interpretation  Date/Time:  Sunday January 18 2017 04:52:14 EDT Ventricular Rate:  110 PR Interval:    QRS Duration: 90 QT Interval:  368 QTC Calculation: 498 R Axis:   57 Text Interpretation:  Sinus tachycardia Borderline repolarization abnormality Borderline prolonged QT interval Since last  tracing rate faster 08 Sep 2016 Confirmed by Jamey Demchak  MD-I, Denya Buckingham ([id number]) on 01/18/2017 4:57:16 AM       Radiology Dg Chest Port 1 View  Result Date: 01/18/2017 CLINICAL DATA:  Acute onset of shortness of breath. Initial encounter. EXAM: PORTABLE CHEST 1 VIEW COMPARISON:  Chest radiograph from 09/08/2016 FINDINGS: The lungs are well-aerated. Vascular congestion is noted. Increased interstitial markings raise concern for pulmonary edema. Trace right-sided pleural fluid is noted. There is no evidence of pneumothorax. The cardiomediastinal silhouette is borderline enlarged. A right-sided dual-lumen catheter is noted ending about the distal SVC. No acute osseous abnormalities are seen. IMPRESSION: Vascular congestion and borderline cardiomegaly. Increased interstitial markings raise concern for pulmonary edema. Trace right-sided pleural fluid noted. Electronically Signed   By: Garald Balding M.D.   On: 01/18/2017 05:45    Procedures Procedures (including critical care  time)  CRITICAL CARE Performed by: Ski Polich L Desmin Daleo Total critical care time: 38 minutes Critical care time was exclusive of separately billable procedures and treating other patients. Critical care was necessary to treat or prevent imminent or life-threatening deterioration. Critical care was time spent personally by me on the following activities: development of treatment plan with patient and/or surrogate as well as nursing, discussions with consultants, evaluation of patient's response to treatment, examination of patient, obtaining history from patient or surrogate, ordering and performing treatments and interventions, ordering and review of laboratory studies, ordering and review of radiographic studies, pulse oximetry and re-evaluation of patient's condition.   Medications Ordered in ED Medications  levalbuterol (XOPENEX) nebulizer solution 0.63 mg ( Nebulization Canceled Entry 01/18/17 0509)  ipratropium (ATROVENT) nebulizer solution 0.5 mg (0.5 mg Nebulization Given 01/18/17 0508)  nitroGLYCERIN (NITROGLYN) 2 % ointment 1 inch (1 inch Topical Given 01/18/17 0548)  levalbuterol (XOPENEX) 1.25 MG/0.5ML nebulizer solution (1.25 mg  Given 01/18/17 0508)  nicardipine (CARDENE) 20mg  in 0.86% saline 261ml IV infusion (0.1 mg/ml) (7.5 mg/hr Intravenous Rate/Dose Change 01/18/17 0656)     Initial Impression / Assessment and Plan / ED Course  I have reviewed the triage vital signs and the nursing notes.  Pertinent labs & imaging results that were available during my care of the patient were reviewed by me and considered in my medical decision making (see chart for details).  Pt was placed on [place] oxygen and his hypoxia improved. He states he felt better.  He was given Xopenex and atrovent nebulizer for his shortness of breath.   06:34 AM Pt's BP did not improve with the NTG paste, he was started on cardene to control his BP. He states his breathing was getting better, but now it is getting worse. Pt  does not know what his dry weight is. On exam he has improved air movement, no wheezing.   06:45 AM Dr Sheran Luz, hospitalist made aware of patient.    06:53 AM BP 177/74 on cardene drip  Final Clinical Impressions(s) / ED Diagnoses   Final diagnoses:  ESRD needing dialysis [facility])  Essential hypertension    Plan admission  Rolland Porter, MD, Barbette Or, MD 01/18/17 [phone]

## 2017-01-18 NOTE — Progress Notes (Signed)
Patient's BP consistently running in the 250N systolic. Paged Dr. Jerilee Hoh to make her aware. She recommended to give all PO meds and to continue dialysis treatment and see where we stand after treatment is complete. Will continue to closely monitor patient. At this time patient is asymptomatic.

## 2017-01-18 NOTE — H&P (Signed)
History and Physical    CASHAWN YANKO TOI:[id number] DOB: June 16, 1941 DOA: 01/18/2017  Referring MD/NP/PA: Rolland Porter, EDP PCP: Donnie Coffin, MD  Patient coming from: Home  Chief Complaint: SOB  HPI: Gerald Davenport is a 76 y.o. male with h/o ESRD, HTN and DM who comes in today with SOB. He has not missed any HD sessions recently. SBP was 236 upon presentation. CXR with PVC and increased interstitial markings. Was placed on a cardene drip in the ED which was later stopped as BP had dropped into the 140s. Admission requested.  Past Medical/Surgical History: Past Medical History:  Diagnosis Date  . Anemia   . Anxiety   . [facility])    Prostate cancer treated with radiation therapy  . Chronic kidney disease    dialysis Mon, Wednes, Fri  . Clot 05/2006   Arterial clot right leg   . Constipation   . Diabetes mellitus    Type II  . Diverticulosis   . GERD (gastroesophageal reflux disease)   . Hemodialysis patient ([place]) 6/13  . Hyperlipidemia   . Hypertension   . Infection    drainage from distal incision of left upper arm arteriovenous graft  . Positive PPD   . Shortness of breath dyspnea    with exertion, when he has too much fluid  . Steal syndrome dialysis vascular access ([place])   . Thyroid disease    hyperparathyroidism  . Wears glasses     Past Surgical History:  Procedure Laterality Date  . AV FISTULA PLACEMENT  11/04/2010   left and left  . AV FISTULA PLACEMENT  11/20/2011   Procedure: INSERTION OF ARTERIOVENOUS (AV) GORE-TEX GRAFT ARM;  Surgeon: Angelia Mould, MD;  Location: [place];  Service: Vascular;  Laterality: Right;  . AV FISTULA PLACEMENT Right 04/19/2015   Procedure: Excision of infected RUA AVG;  Surgeon: Conrad [place], MD;  Location: [place];  Service: Vascular;  Laterality: Right;  . AV FISTULA PLACEMENT Left 10/04/2015   Procedure: INSERTION OF ARTERIOVENOUS (AV) GORE-TEX GRAFT ARM;  Surgeon: Conrad Meagher, MD;  Location: [place];  Service: Vascular;  Laterality:  Left;  . [place] REMOVAL Left 10/13/2015   Procedure: REMOVAL OF INFECTED ARTERIOVENOUS GORETEX LEFT GRAFT ;  Surgeon: Conrad [place], MD;  Location: [place];  Service: Vascular;  Laterality: Left;  . BASCILIC VEIN TRANSPOSITION Left 06/19/2015   Procedure: FIRST STAGE BRACHIAL VEIN TRANSPOSITION;  Surgeon: Conrad [place], MD;  Location: [place];  Service: Vascular;  Laterality: Left;  . 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 COLONOSCOPY N/A 11/10/2012   IPJ:[id number] proctitis/Pancolonic diverticulosis/(TUBULAR ADENOMA)5 mm polyp in the transverse segment/4 mm polyps in the base of the cecum and diminutive polyp in the descending segment. INADEQUATE BOWEL PREP  . COLONOSCOPY N/A 11/28/2013   RMR: Radiation proctitis. Colonic diverticulosis. Colonic polyps-removed as described above.   . INSERTION OF DIALYSIS CATHETER Right 01/19/2016   Procedure: INSERTION OF Right internal jugular  DIALYSIS CATHETER.;  Surgeon: Elam Dutch, MD;  Location: [place];  Service: Vascular;  Laterality: Right;  . PERIPHERAL VASCULAR CATHETERIZATION Bilateral 12/06/2015   Procedure: Upper Extremity Venography/ central venogram;  Surgeon: Conrad Harper, MD;  Location: [place] CV LAB;  Service: Cardiovascular;  Laterality: Bilateral;  . PERIPHERAL VASCULAR CATHETERIZATION N/A 05/29/2016   Procedure: Upper Extremity Angiography;  Surgeon: Conrad Fleming-Neon, MD;  Location: [place] CV LAB;  Service: Cardiovascular;  Laterality: N/A;  . PERIPHERAL VASCULAR  CATHETERIZATION N/A 05/29/2016   Procedure: Lower Extremity Angiography;  Surgeon: Conrad Kimberly, MD;  Location: [place] CV LAB;  Service: Cardiovascular;  Laterality: N/A;  . PERIPHERAL VASCULAR CATHETERIZATION  05/29/2016   Procedure: Peripheral Vascular Intervention;  Surgeon: Conrad [place], MD;  Location: [place] CV LAB;  Service: Cardiovascular;;  . REMOVAL OF A HERO DEVICE Right 01/19/2016   Procedure: REMOVAL OF A HERO DEVICE;  Surgeon: Elam Dutch, MD;   Location: [place];  Service: Vascular;  Laterality: Right;  . SHUNTOGRAM N/A 10/08/2012   Procedure: [name];  Surgeon: Elam Dutch, MD;  Location: [facility] CATH LAB;  Service: Cardiovascular;  Laterality: N/A;  . VASCULAR ACCESS DEVICE INSERTION Right 01/01/2016   Procedure: INSERTION OF HERO VASCULAR ACCESS DEVICE - RIGHT;  Surgeon: Conrad [place], MD;  Location: [place];  Service: Vascular;  Laterality: Right;    Social History:  reports that he has been smoking Cigarettes.  He has a 12.50 pack-year smoking history. He has never used smokeless tobacco. He reports that he does not drink alcohol or use drugs.  Allergies: No Known Allergies  Family History:  Family History  Problem Relation Age of Onset  . Heart disease Mother   . Diabetes Mother   . Hypertension Mother   . Stroke Mother   . Heart disease Father   . Diabetes Father   . Heart disease Sister   . Diabetes Brother   . Colon cancer Neg Hx     Prior to Admission medications   Medication Sig Start Date End Date Taking? Authorizing Provider  acetaminophen-codeine (TYLENOL #3) 300-30 MG tablet Take 1 tablet by mouth every 4 (four) hours as needed for moderate pain. 01/19/16   Elam Dutch, MD  buPROPion (WELLBUTRIN XL) 150 MG 24 hr tablet Take 150 mg by mouth daily. 03/05/15   Historical Provider, MD  calcium acetate (PHOSLO) 667 MG capsule Take 667-2,001 mg by mouth See admin instructions. Take 2001 mg by mouth 3 times daily. Take 667 mg by mouth with snacks. 10/26/12   Historical Provider, MD  cloNIDine (CATAPRES) 0.2 MG tablet Take 1 tablet (0.2 mg total) by mouth 3 (three) times daily. 01/19/16   [name], MD  doxazosin (CARDURA) 8 MG tablet Take 8 mg by mouth daily. Reported on 02/26/2016    Historical Provider, MD  fluorometholone (FML) 0.1 % ophthalmic suspension Place 1 drop into both eyes at bedtime. 04/15/16   Historical Provider, MD  furosemide (LASIX) 40 MG tablet Take 40 mg by mouth 2 (two) times daily.     Historical  Provider, MD  gabapentin (NEURONTIN) 100 MG capsule Take 100 mg by mouth 2 (two) times daily. 04/13/15   Historical Provider, MD  Insulin Detemir (LEVEMIR FLEXTOUCH) 100 UNIT/ML Pen Inject 20 Units into the skin daily at 10 pm.    Historical Provider, MD  Linaclotide (LINZESS) 145 MCG CAPS capsule Take 1 capsule (145 mcg total) by mouth daily. Take 30 minutes before breakfast 02/06/15   Carlis Stable, NP  omeprazole (PRILOSEC) 20 MG capsule Take 20 mg by mouth daily.  02/20/15   Historical Provider, MD  oxyCODONE (OXY IR/ROXICODONE) 5 MG immediate release tablet Take 5 mg by mouth every 4 (four) hours as needed for severe pain.    Historical Provider, MD  Polyvinyl Alcohol-Povidone (REFRESH OP) Place 1 drop into both eyes at bedtime.    Historical Provider, MD  psyllium (METAMUCIL) 58.6 % powder Take 1 packet by mouth as needed (  for stool).     Historical Provider, MD  QUEtiapine (SEROQUEL) 100 MG tablet Take 200 mg by mouth at bedtime.  03/05/15   Historical Provider, MD  saxagliptin HCl (ONGLYZA) 5 MG TABS tablet Take 5 mg by mouth daily with breakfast.     Historical Provider, MD  SENSIPAR 30 MG tablet Take 30 mg by mouth daily.  11/20/15   Historical Provider, MD  sevelamer carbonate (RENVELA) 800 MG tablet Take 800-1,600 mg by mouth 3 (three) times daily with meals.    Historical Provider, MD  simvastatin (ZOCOR) 20 MG tablet Take 20 mg by mouth daily.    Historical Provider, MD    Review of Systems:  Constitutional: Denies fever, chills, diaphoresis, appetite change and fatigue.  HEENT: Denies photophobia, eye pain, redness, hearing loss, ear pain, congestion, sore throat, rhinorrhea, sneezing, mouth sores, trouble swallowing, neck pain, neck stiffness and tinnitus.   Respiratory: Denies  cough, chest tightness,  and wheezing.   Cardiovascular: Denies chest pain, palpitations and leg swelling.  Gastrointestinal: Denies nausea, vomiting, abdominal pain, diarrhea, constipation, blood in stool and  abdominal distention.  Genitourinary: Denies dysuria, urgency, frequency, hematuria, flank pain and difficulty urinating.  Endocrine: Denies: hot or cold intolerance, sweats, changes in hair or nails, polyuria, polydipsia. Musculoskeletal: Denies myalgias, back pain, joint swelling, arthralgias and gait problem.  Skin: Denies pallor, rash and wound.  Neurological: Denies dizziness, seizures, syncope, weakness, light-headedness, numbness and headaches.  Hematological: Denies adenopathy. Easy bruising, personal or family bleeding history  Psychiatric/Behavioral: Denies suicidal ideation, mood changes, confusion, nervousness, sleep disturbance and agitation    Physical Exam: Vitals:   01/18/17 1146 01/18/17 1300 01/18/17 1400 01/18/17 1630  BP:  (!) 195/88 (!) 210/92 (!) 162/87  Pulse:  89 85   Resp:  17 14   Temp: 97.9 F (36.6 C) 97.9 F (36.6 C)    TempSrc: Oral Oral    SpO2: (!) 87% 96% 99%   Weight:  70.3 kg (154 lb 15.7 oz)    Height:         Constitutional: NAD, calm, comfortable Eyes: PERRL, lids and conjunctivae normal ENMT: Mucous membranes are moist. Posterior pharynx clear of any exudate or lesions.Normal dentition.  Neck: normal, supple, no masses, no thyromegaly Respiratory: bilateral crackles Cardiovascular: Regular rate and rhythm, no murmurs / rubs / gallops. No extremity edema. 2+ pedal pulses. No carotid bruits.  Abdomen: no tenderness, no masses palpated. No hepatosplenomegaly. Bowel sounds positive.  Musculoskeletal: no clubbing / cyanosis. No joint deformity upper and lower extremities. Good ROM, no contractures. Normal muscle tone.  Skin: no rashes, lesions, ulcers. No induration Neurologic: CN 2-12 grossly intact. Sensation intact, DTR normal. Strength 5/5 in all 4.  Psychiatric: Normal judgment and insight. Alert and oriented x 3. Normal mood.    Labs on Admission: I have personally reviewed the following labs and imaging studies  CBC:  Recent  Labs Lab 01/18/17 0500  WBC 14.3*  NEUTROABS 11.2*  HGB 10.6*  HCT 32.7*  MCV 109.0*  PLT 294   Basic Metabolic Panel:  Recent Labs Lab 01/18/17 0500  NA 135  K 4.0  CL 94*  CO2 28  GLUCOSE 177*  BUN 24*  CREATININE 7.87*  CALCIUM 8.5*   GFR: Estimated Creatinine Clearance: 7.8 mL/min (A) (by C-G formula based on SCr of 7.87 mg/dL (H)). Liver Function Tests:  Recent Labs Lab 01/18/17 0500  AST 14*  ALT 8*  ALKPHOS 70  BILITOT 0.5  PROT 7.8  ALBUMIN 3.6  No results for input(s): LIPASE, AMYLASE in the last 168 hours. No results for input(s): AMMONIA in the last 168 hours. Coagulation Profile: No results for input(s): INR, PROTIME in the last 168 hours. Cardiac Enzymes:  Recent Labs Lab 01/18/17 1318  TROPONINI 0.08*   BNP (last 3 results) No results for input(s): PROBNP in the last 8760 hours. HbA1C: No results for input(s): HGBA1C in the last 72 hours. CBG:  Recent Labs Lab 01/18/17 1221 01/18/17 1643  GLUCAP 129* 144*   Lipid Profile: No results for input(s): CHOL, HDL, LDLCALC, TRIG, CHOLHDL, LDLDIRECT in the last 72 hours. Thyroid Function Tests:  Recent Labs  01/18/17 1318  TSH 0.973   Anemia Panel: No results for input(s): VITAMINB12, FOLATE, FERRITIN, TIBC, IRON, RETICCTPCT in the last 72 hours. Urine analysis: No results found for: COLORURINE, APPEARANCEUR, LABSPEC, [facility], GLUCOSEU, HGBUR, BILIRUBINUR, KETONESUR, PROTEINUR, UROBILINOGEN, NITRITE, LEUKOCYTESUR Sepsis Labs: @LABRCNTIP (procalcitonin:4,lacticidven:4) ) Recent Results (from the past 240 hour(s))  MRSA PCR Screening     Status: None   Collection Time: 01/18/17 12:27 PM  Result Value Ref Range Status   MRSA by PCR NEGATIVE NEGATIVE Final    Comment:        The GeneXpert MRSA Assay (FDA approved for NASAL specimens only), is one component of a comprehensive MRSA colonization surveillance program. It is not intended to diagnose MRSA infection nor to guide  or monitor treatment for MRSA infections.      Radiological Exams on Admission: Dg Chest Port 1 View  Result Date: 01/18/2017 CLINICAL DATA:  Acute onset of shortness of breath. Initial encounter. EXAM: PORTABLE CHEST 1 VIEW COMPARISON:  Chest radiograph from 09/08/2016 FINDINGS: The lungs are well-aerated. Vascular congestion is noted. Increased interstitial markings raise concern for pulmonary edema. Trace right-sided pleural fluid is noted. There is no evidence of pneumothorax. The cardiomediastinal silhouette is borderline enlarged. A right-sided dual-lumen catheter is noted ending about the distal SVC. No acute osseous abnormalities are seen. IMPRESSION: Vascular congestion and borderline cardiomegaly. Increased interstitial markings raise concern for pulmonary edema. Trace right-sided pleural fluid noted. Electronically Signed   By: Garald Balding M.D.   On: 01/18/2017 05:45    EKG: Independently reviewed. Sinus tachycardia, no acute ischemic changes  Assessment/Plan Principal Problem:   SOB (shortness of breath) Active Problems:   Type 2 diabetes mellitus with ESRD (end-stage renal disease) (HCC)   GERD (gastroesophageal reflux disease)   Essential hypertension, benign   Mixed hyperlipidemia   Diabetic neuropathy associated with type 2 diabetes mellitus ([place])   ESRD on dialysis ([place])   Hypertensive urgency    Acute Hypoxemic Resp Failure/SOB -Suspect due to flash pulmonary edema given CXR findings. -Will receive extra HD session today. -Wean oxygen as tolerated. -Advised on adequate renal diet (decreased salt and fluid intake) to decrease chances of this happening again.  ESRD -Nephrology consulted. -Regular HD scheduled MWF. -Will receive extra HD today due to symptoms.  HTN -Continue home meds. -Should improve after fluid removal with HD.  HLD -Continue statin    DVT prophylaxis: lovenox  Code Status: full code  Family Communication: multiple family members  at bedside updated on plan of care and all questions answered.  Disposition Plan: hope for DC home in 24-48 hours  Consults called: Nephrology  Admission status: inpatient    Time Spent: 85 minutes  Lelon Frohlich MD [facility] Pager [phone]  If 7PM-7AM, please contact night-coverage [email or web address] Password [facility]  01/18/2017, 5:16 PM

## 2017-01-18 NOTE — ED Notes (Signed)
Physician in to assess 

## 2017-01-18 NOTE — ED Notes (Signed)
Cardene drip titrated down to 5.0 due to blood pressure being 161/71.

## 2017-01-18 NOTE — ED Notes (Signed)
Cardene drip titrated down to 3.0 due to blood pressure being 160/65.

## 2017-01-19 ENCOUNTER — Other Ambulatory Visit: Payer: Self-pay | Admitting: Vascular Surgery

## 2017-01-19 DIAGNOSIS — T82511D Breakdown (mechanical) of surgically created arteriovenous shunt, subsequent encounter: Secondary | ICD-10-CM

## 2017-01-19 DIAGNOSIS — Z01810 Encounter for preprocedural cardiovascular examination: Secondary | ICD-10-CM

## 2017-01-19 LAB — BASIC METABOLIC PANEL
Anion gap: 11 (ref 5–15)
BUN: 17 mg/dL (ref 6–20)
CALCIUM: 8.3 mg/dL — AB (ref 8.9–10.3)
CO2: 29 mmol/L (ref 22–32)
CREATININE: 6.24 mg/dL — AB (ref 0.61–1.24)
Chloride: 93 mmol/L — ABNORMAL LOW (ref 101–111)
GFR, EST AFRICAN AMERICAN: 9 mL/min — AB (ref >60)
GFR, EST NON AFRICAN AMERICAN: 8 mL/min — AB (ref >60)
Glucose, Bld: 148 mg/dL — ABNORMAL HIGH (ref 65–99)
Potassium: 4.1 mmol/L (ref 3.5–5.1)
SODIUM: 133 mmol/L — AB (ref 135–145)

## 2017-01-19 LAB — GLUCOSE, CAPILLARY
GLUCOSE-CAPILLARY: 126 mg/dL — AB (ref 65–99)
Glucose-Capillary: 148 mg/dL — ABNORMAL HIGH (ref 65–99)

## 2017-01-19 LAB — TROPONIN I: TROPONIN I: 0.09 ng/mL — AB (ref <0.03)

## 2017-01-19 LAB — HEPATITIS B SURFACE ANTIGEN: HEP B S AG: NEGATIVE

## 2017-01-19 NOTE — Care Management Note (Signed)
Case Management Note  Patient Details  Name: [name] MRN: [id number] Date of Birth: [birth year]    Subjective/Objective:    Adm from home with SOB. Chart reviewed. From home, ind with ADL's. CM consult for medicaiton assistance.  HD on MWF schedule.            Action/Plan: Discharged home with self care. Patient had insurance with prescription coverage. No CM needs.   Expected Discharge Date:  01/19/17               Expected Discharge Plan:  Home/Self Care  In-House Referral:     Discharge planning Services  CM Consult  Post Acute Care Choice:    Choice offered to:  NA  DME Arranged:    DME Agency:     HH Arranged:    HH Agency:     Status of Service:  Completed, signed off  If discussed at [employer] of Stay Meetings, dates discussed:    Additional Comments:  Griffon Herberg, Chauncey Reading, RN 01/19/2017, 12:49 PM

## 2017-01-19 NOTE — Progress Notes (Signed)
Subjective: Interval History: has no complaint of difficulty in breathing. Patient is feeling much better..  Objective: Vital signs in last 24 hours: Temp:  [97.9 F (36.6 C)-98.7 F (37.1 C)] 98.1 F (36.7 C) (04/23 0728) Pulse Rate:  [80-97] 92 (04/23 0728) Resp:  [14-23] 16 (04/23 0600) BP: (158-210)/(74-92) 180/90 (04/23 0600) SpO2:  [87 %-100 %] 100 % (04/23 0728) FiO2 (%):  [36 %] 36 % (04/22 2000) Weight:  [62.4 kg (137 lb 9.1 oz)-70.3 kg (154 lb 15.7 oz)] 62.4 kg (137 lb 9.1 oz) (04/23 0500) Weight change: -0.008 kg (-0.3 oz)  Intake/Output from previous day: 04/22 0701 - 04/23 0700 In: 79.6 [I.V.:79.6] Out: 3000  Intake/Output this shift: No intake/output data recorded.  General appearance: alert, cooperative and no distress Resp: clear to auscultation bilaterally Cardio: regular rate and rhythm Extremities: No edema  Lab Results:  Recent Labs  01/18/17 0500  WBC 14.3*  HGB 10.6*  HCT 32.7*  PLT 258   BMET:  Recent Labs  01/18/17 0500 01/19/17 0416  NA 135 133*  K 4.0 4.1  CL 94* 93*  CO2 28 29  GLUCOSE 177* 148*  BUN 24* 17  CREATININE 7.87* 6.24*  CALCIUM 8.5* 8.3*   No results for input(s): PTH in the last 72 hours. Iron Studies: No results for input(s): IRON, TIBC, TRANSFERRIN, FERRITIN in the last 72 hours.  Studies/Results: Dg Chest Port 1 View  Result Date: 01/18/2017 CLINICAL DATA:  Acute onset of shortness of breath. Initial encounter. EXAM: PORTABLE CHEST 1 VIEW COMPARISON:  Chest radiograph from 09/08/2016 FINDINGS: The lungs are well-aerated. Vascular congestion is noted. Increased interstitial markings raise concern for pulmonary edema. Trace right-sided pleural fluid is noted. There is no evidence of pneumothorax. The cardiomediastinal silhouette is borderline enlarged. A right-sided dual-lumen catheter is noted ending about the distal SVC. No acute osseous abnormalities are seen. IMPRESSION: Vascular congestion and borderline  cardiomegaly. Increased interstitial markings raise concern for pulmonary edema. Trace right-sided pleural fluid noted. Electronically Signed   By: Garald Balding M.D.   On: 01/18/2017 05:45    I have reviewed the patient's current medications.  Assessment/Plan: Problem #1 difficulty in breathing: Most likely from fluid overload. Patient was dialyzed last night and 3 L was removed. Presently he is feeling much better. Problem #2 end-stage renal disease: His potassium is normal. Patient is asymptomatic. Problem #3 anemia: His hemoglobin is within our target goal. Problem #4 metabolic bone disease: His calcium is range. Presently is on phosphorus binder Problem # 5 hypertension his blood pressure is reasonably controlled Problem #6 history of diabetes   problem #7 history of GERD  Plan: Patient does require dialysis 2] 1] He is going to discharge" with regular units. 3] The patient is going to be here we'll make arrangements for him to get dialysis tomorrow 4] We'll check his CBC and basic metabolic panel in the morning.   LOS: 1 day   Iver Miklas S 01/19/2017,9:10 AM

## 2017-01-19 NOTE — Discharge Summary (Signed)
Physician Discharge Summary  Gerald Davenport IRC:[id number] DOB: Jun 10, 1941 DOA: 01/18/2017  PCP: Donnie Coffin, MD  Admit date: 01/18/2017 Discharge date: 01/19/2017  Time spent: 45 minutes  Recommendations for Outpatient Follow-up:  -Will be discharged home today. -Will follow up with his OP HD center.   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)   GERD (gastroesophageal reflux disease)   Essential hypertension, benign   Mixed hyperlipidemia   Diabetic neuropathy associated with type 2 diabetes mellitus ([place])   ESRD on dialysis [facility])   Hypertensive urgency   Discharge Condition: Stable and improved  Filed Weights   01/18/17 0447 01/18/17 1300 01/19/17 0500  Weight: 70.3 kg (155 lb) 70.3 kg (154 lb 15.7 oz) 62.4 kg (137 lb 9.1 oz)    History of present illness:  Gerald Davenport is a 76 y.o. male with h/o ESRD, HTN and DM who comes in today with SOB. He has not missed any HD sessions recently. SBP was 236 upon presentation. CXR with PVC and increased interstitial markings. Was placed on a cardene drip in the ED which was later stopped as BP had dropped into the 140s. Admission requested.  Hospital Course:   Acute Hypoxemic Respiratory Failure/SOB -Suspect due to flash pulmonary edema given CXR findings. -Resolved after emergent HD session. -Has no current oxygen requirements.  ESRD -Received emergent HD session. -Per renal, to continue OP HD scheduled and OK to DC home.  HTN -Improved after HD and re-initiation of home meds.  HLD -Continue statin.  Procedures:  Emergent HD   Consultations:  Nephrology  Discharge Instructions  Discharge Instructions    Diet - low sodium heart healthy    Complete by:  As directed    Increase activity slowly    Complete by:  As directed      Allergies as of 01/19/2017   No Known Allergies     Medication List      TAKE these medications   acetaminophen-codeine 300-30 MG tablet Commonly known as:  TYLENOL #3 Take 1 tablet by mouth every 4 (four) hours as needed for moderate pain.   buPROPion 150 MG 24 hr tablet Commonly known as:  WELLBUTRIN XL Take 150 mg by mouth daily.   calcium acetate 667 MG capsule Commonly known as:  PHOSLO Take 667-2,001 mg by mouth See admin instructions. Take 2001 mg by mouth 3 times daily. Take 667 mg by mouth with snacks.   cloNIDine 0.2 MG tablet Commonly known as:  CATAPRES Take 1 tablet (0.2 mg total) by mouth 3 (three) times daily.   doxazosin 8 MG tablet Commonly known as:  CARDURA Take 8 mg by mouth daily. Reported on 02/26/2016   fluorometholone 0.1 % ophthalmic suspension Commonly known as:  FML Place 1 drop into both eyes at bedtime.   furosemide 40 MG tablet Commonly known as:  LASIX Take 40 mg by mouth 2 (two) times daily.   gabapentin 100 MG capsule Commonly known as:  NEURONTIN Take 100 mg by mouth 2 (two) times daily.   LEVEMIR FLEXTOUCH 100 UNIT/ML Pen Generic drug:  Insulin Detemir Inject 20 Units into the skin daily at 10 pm.   linaclotide 145 MCG Caps capsule Commonly known as:  LINZESS Take 1 capsule (145 mcg total) by mouth daily. Take 30 minutes before breakfast   omeprazole 20 MG capsule Commonly known as:  PRILOSEC Take 20 mg  by mouth daily.   ONGLYZA 5 MG Tabs tablet Generic drug:  saxagliptin HCl Take 5 mg by mouth daily with breakfast.   oxyCODONE 5 MG immediate release tablet Commonly known as:  Oxy IR/ROXICODONE Take 5 mg by mouth every 4 (four) hours as needed for severe pain.   psyllium 58.6 % powder Commonly known as:  METAMUCIL Take 1 packet by mouth as needed (for stool).   QUEtiapine 100 MG tablet Commonly known as:  SEROQUEL Take 200 mg by mouth at bedtime.   REFRESH OP Place 1 drop into both eyes at bedtime.   SENSIPAR 30 MG tablet Generic drug:  cinacalcet Take 30 mg by mouth daily.    sevelamer carbonate 800 MG tablet Commonly known as:  RENVELA Take 800-1,600 mg by mouth 3 (three) times daily with meals.   simvastatin 20 MG tablet Commonly known as:  ZOCOR Take 20 mg by mouth daily.      No Known Allergies Follow-up Information    Donnie Coffin, MD. Schedule an appointment as soon as possible for a visit in 2 week(s).   Specialty:  Family Medicine Contact information: [address] [place] [address] [phone]            The results of significant diagnostics from this hospitalization (including imaging, microbiology, ancillary and laboratory) are listed below for reference.    Significant Diagnostic Studies: Dg Chest Port 1 View  Result Date: 01/18/2017 CLINICAL DATA:  Acute onset of shortness of breath. Initial encounter. EXAM: PORTABLE CHEST 1 VIEW COMPARISON:  Chest radiograph from 09/08/2016 FINDINGS: The lungs are well-aerated. Vascular congestion is noted. Increased interstitial markings raise concern for pulmonary edema. Trace right-sided pleural fluid is noted. There is no evidence of pneumothorax. The cardiomediastinal silhouette is borderline enlarged. A right-sided dual-lumen catheter is noted ending about the distal SVC. No acute osseous abnormalities are seen. IMPRESSION: Vascular congestion and borderline cardiomegaly. Increased interstitial markings raise concern for pulmonary edema. Trace right-sided pleural fluid noted. Electronically Signed   By: Garald Balding M.D.   On: 01/18/2017 05:45    Microbiology: Recent Results (from the past 240 hour(s))  MRSA PCR Screening     Status: None   Collection Time: 01/18/17 12:27 PM  Result Value Ref Range Status   MRSA by PCR NEGATIVE NEGATIVE Final    Comment:        The GeneXpert MRSA Assay (FDA approved for NASAL specimens only), is one component of a comprehensive MRSA colonization surveillance program. It is not intended to diagnose MRSA infection nor to guide  or monitor treatment for MRSA infections.      Labs: Basic Metabolic Panel:  Recent Labs Lab 01/18/17 0500 01/19/17 0416  NA 135 133*  K 4.0 4.1  CL 94* 93*  CO2 28 29  GLUCOSE 177* 148*  BUN 24* 17  CREATININE 7.87* 6.24*  CALCIUM 8.5* 8.3*   Liver Function Tests:  Recent Labs Lab 01/18/17 0500  AST 14*  ALT 8*  ALKPHOS 70  BILITOT 0.5  PROT 7.8  ALBUMIN 3.6   No results for input(s): LIPASE, AMYLASE in the last 168 hours. No results for input(s): AMMONIA in the last 168 hours. CBC:  Recent Labs Lab 01/18/17 0500  WBC 14.3*  NEUTROABS 11.2*  HGB 10.6*  HCT 32.7*  MCV 109.0*  PLT 258   Cardiac Enzymes:  Recent Labs Lab 01/18/17 1318 01/18/17 1900 01/19/17 0038  TROPONINI 0.08* 0.08* 0.09*   BNP: BNP (last 3 results)  No results for input(s): BNP in the last 8760 hours.  ProBNP (last 3 results) No results for input(s): PROBNP in the last 8760 hours.  CBG:  Recent Labs Lab 01/18/17 1221 01/18/17 1643 01/18/17 2057 01/19/17 0727 01/19/17 1130  GLUCAP 129* 144* 147* 126* 148*       Signed:  HERNANDEZ ACOSTA,ESTELA  [facility] Pager: [phone] 01/19/2017, 3:28 PM

## 2017-01-19 NOTE — Progress Notes (Signed)
1235 d/c instructions and paperwork given to patient and patient's wife. IV catheter removed from LEFT FA, intact w/no s/s of infiltration/infection noted at this time. Patient confirmed having all of his belongings he came in with. NT assisted patient with getting dressed and applying prosthetic leg. Patient assisted to vehicle via w/c, wife to transport patient home.

## 2017-01-23 ENCOUNTER — Emergency Department (HOSPITAL_COMMUNITY): Payer: Medicare Other

## 2017-01-23 ENCOUNTER — Encounter (HOSPITAL_COMMUNITY): Payer: Self-pay

## 2017-01-23 ENCOUNTER — Inpatient Hospital Stay (HOSPITAL_COMMUNITY)
Admission: EM | Admit: 2017-01-23 | Discharge: 2017-01-25 | DRG: 640 | Disposition: A | Discharge status: Home / Self Care | Payer: Medicare Other | Attending: Internal Medicine | Admitting: Internal Medicine

## 2017-01-23 DIAGNOSIS — E782 Mixed hyperlipidemia: Secondary | ICD-10-CM | POA: Diagnosis not present

## 2017-01-23 DIAGNOSIS — Z923 Personal history of irradiation: Secondary | ICD-10-CM | POA: Diagnosis not present

## 2017-01-23 DIAGNOSIS — J9600 Acute respiratory failure, unspecified whether with hypoxia or hypercapnia: Secondary | ICD-10-CM

## 2017-01-23 DIAGNOSIS — I70212 Atherosclerosis of native arteries of extremities with intermittent claudication, left leg: Secondary | ICD-10-CM

## 2017-01-23 DIAGNOSIS — E1140 Type 2 diabetes mellitus with diabetic neuropathy, unspecified: Secondary | ICD-10-CM | POA: Diagnosis present

## 2017-01-23 DIAGNOSIS — E11649 Type 2 diabetes mellitus with hypoglycemia without coma: Secondary | ICD-10-CM | POA: Diagnosis present

## 2017-01-23 DIAGNOSIS — N185 Chronic kidney disease, stage 5: Secondary | ICD-10-CM

## 2017-01-23 DIAGNOSIS — Z833 Family history of diabetes mellitus: Secondary | ICD-10-CM

## 2017-01-23 DIAGNOSIS — Z6820 Body mass index (BMI) 20.0-20.9, adult: Secondary | ICD-10-CM

## 2017-01-23 DIAGNOSIS — M898X9 Other specified disorders of bone, unspecified site: Secondary | ICD-10-CM | POA: Diagnosis present

## 2017-01-23 DIAGNOSIS — N189 Chronic kidney disease, unspecified: Secondary | ICD-10-CM | POA: Diagnosis present

## 2017-01-23 DIAGNOSIS — R0602 Shortness of breath: Secondary | ICD-10-CM | POA: Diagnosis present

## 2017-01-23 DIAGNOSIS — Z8249 Family history of ischemic heart disease and other diseases of the circulatory system: Secondary | ICD-10-CM | POA: Diagnosis not present

## 2017-01-23 DIAGNOSIS — E1151 Type 2 diabetes mellitus with diabetic peripheral angiopathy without gangrene: Secondary | ICD-10-CM | POA: Diagnosis present

## 2017-01-23 DIAGNOSIS — Z794 Long term (current) use of insulin: Secondary | ICD-10-CM | POA: Diagnosis not present

## 2017-01-23 DIAGNOSIS — N2581 Secondary hyperparathyroidism of renal origin: Secondary | ICD-10-CM | POA: Diagnosis present

## 2017-01-23 DIAGNOSIS — E871 Hypo-osmolality and hyponatremia: Secondary | ICD-10-CM | POA: Diagnosis not present

## 2017-01-23 DIAGNOSIS — Z823 Family history of stroke: Secondary | ICD-10-CM

## 2017-01-23 DIAGNOSIS — J9601 Acute respiratory failure with hypoxia: Secondary | ICD-10-CM | POA: Diagnosis not present

## 2017-01-23 DIAGNOSIS — D631 Anemia in chronic kidney disease: Secondary | ICD-10-CM | POA: Diagnosis present

## 2017-01-23 DIAGNOSIS — Z992 Dependence on renal dialysis: Secondary | ICD-10-CM

## 2017-01-23 DIAGNOSIS — E1149 Type 2 diabetes mellitus with other diabetic neurological complication: Secondary | ICD-10-CM | POA: Diagnosis not present

## 2017-01-23 DIAGNOSIS — I1311 Hypertensive heart and chronic kidney disease without heart failure, with stage 5 chronic kidney disease, or end stage renal disease: Secondary | ICD-10-CM | POA: Diagnosis present

## 2017-01-23 DIAGNOSIS — K5900 Constipation, unspecified: Secondary | ICD-10-CM | POA: Diagnosis not present

## 2017-01-23 DIAGNOSIS — F17210 Nicotine dependence, cigarettes, uncomplicated: Secondary | ICD-10-CM | POA: Diagnosis present

## 2017-01-23 DIAGNOSIS — Z8546 Personal history of malignant neoplasm of prostate: Secondary | ICD-10-CM | POA: Diagnosis not present

## 2017-01-23 DIAGNOSIS — E1122 Type 2 diabetes mellitus with diabetic chronic kidney disease: Secondary | ICD-10-CM | POA: Diagnosis not present

## 2017-01-23 DIAGNOSIS — I160 Hypertensive urgency: Secondary | ICD-10-CM | POA: Diagnosis not present

## 2017-01-23 DIAGNOSIS — R64 Cachexia: Secondary | ICD-10-CM | POA: Diagnosis present

## 2017-01-23 DIAGNOSIS — N186 End stage renal disease: Secondary | ICD-10-CM

## 2017-01-23 DIAGNOSIS — K219 Gastro-esophageal reflux disease without esophagitis: Secondary | ICD-10-CM | POA: Diagnosis not present

## 2017-01-23 DIAGNOSIS — J811 Chronic pulmonary edema: Secondary | ICD-10-CM | POA: Diagnosis present

## 2017-01-23 DIAGNOSIS — E8770 Fluid overload, unspecified: Secondary | ICD-10-CM | POA: Diagnosis present

## 2017-01-23 DIAGNOSIS — I70229 Atherosclerosis of native arteries of extremities with rest pain, unspecified extremity: Secondary | ICD-10-CM | POA: Diagnosis present

## 2017-01-23 LAB — BASIC METABOLIC PANEL
Anion gap: 14 (ref 5–15)
Anion gap: 10 (ref 5–15)
BUN: 33 mg/dL — AB (ref 6–20)
BUN: 35 mg/dL — AB (ref 6–20)
CALCIUM: 8.7 mg/dL — AB (ref 8.9–10.3)
CHLORIDE: 95 mmol/L — AB (ref 101–111)
CO2: 27 mmol/L (ref 22–32)
CO2: 27 mmol/L (ref 22–32)
CREATININE: 9.26 mg/dL — AB (ref 0.61–1.24)
Calcium: 8.3 mg/dL — ABNORMAL LOW (ref 8.9–10.3)
Chloride: 95 mmol/L — ABNORMAL LOW (ref 101–111)
Creatinine, Ser: 8.85 mg/dL — ABNORMAL HIGH (ref 0.61–1.24)
GFR calc Af Amer: 6 mL/min — ABNORMAL LOW (ref >60)
GFR calc Af Amer: 6 mL/min — ABNORMAL LOW (ref >60)
GFR calc non Af Amer: 5 mL/min — ABNORMAL LOW (ref >60)
GFR, EST NON AFRICAN AMERICAN: 5 mL/min — AB (ref >60)
GLUCOSE: 148 mg/dL — AB (ref 65–99)
GLUCOSE: 135 mg/dL — AB (ref 65–99)
POTASSIUM: 4.7 mmol/L (ref 3.5–5.1)
Potassium: 5.3 mmol/L — ABNORMAL HIGH (ref 3.5–5.1)
Sodium: 136 mmol/L (ref 135–145)
Sodium: 132 mmol/L — ABNORMAL LOW (ref 135–145)

## 2017-01-23 LAB — CBC
HEMATOCRIT: 33.2 % — AB (ref 39.0–52.0)
HEMOGLOBIN: 10.8 g/dL — AB (ref 13.0–17.0)
MCH: 35.4 pg — AB (ref 26.0–34.0)
MCHC: 32.5 g/dL (ref 30.0–36.0)
MCV: 108.9 fL — AB (ref 78.0–100.0)
Platelets: 217 10*3/uL (ref 150–400)
RBC: 3.05 MIL/uL — ABNORMAL LOW (ref 4.22–5.81)
RDW: 18.8 % — ABNORMAL HIGH (ref 11.5–15.5)
WBC: 10.6 10*3/uL — ABNORMAL HIGH (ref 4.0–10.5)

## 2017-01-23 LAB — BLOOD GAS, ARTERIAL
ACID-BASE EXCESS: 2.8 mmol/L — AB (ref 0.0–2.0)
Allens test (pass/fail): POSITIVE — AB
BICARBONATE: 26.5 mmol/L (ref 20.0–28.0)
Drawn by: 382351
FIO2: 36
O2 Saturation: 97.3 %
PCO2 ART: 47.8 mmHg (ref 32.0–48.0)
PO2 ART: 108 mmHg (ref 83.0–108.0)
Patient temperature: 37
pH, Arterial: 7.378 (ref 7.350–7.450)

## 2017-01-23 LAB — CBC WITH DIFFERENTIAL/PLATELET
Basophils Absolute: 0 10*3/uL (ref 0.0–0.1)
Basophils Relative: 0 %
EOS PCT: 2 %
Eosinophils Absolute: 0.3 10*3/uL (ref 0.0–0.7)
HEMATOCRIT: 35.2 % — AB (ref 39.0–52.0)
Hemoglobin: 11.3 g/dL — ABNORMAL LOW (ref 13.0–17.0)
LYMPHS PCT: 10 %
Lymphs Abs: 1.4 10*3/uL (ref 0.7–4.0)
MCH: 35.2 pg — ABNORMAL HIGH (ref 26.0–34.0)
MCHC: 32.1 g/dL (ref 30.0–36.0)
MCV: 109.7 fL — AB (ref 78.0–100.0)
MONO ABS: 0.8 10*3/uL (ref 0.1–1.0)
MONOS PCT: 6 %
NEUTROS ABS: 12 10*3/uL — AB (ref 1.7–7.7)
Neutrophils Relative %: 82 %
PLATELETS: 259 10*3/uL (ref 150–400)
RBC: 3.21 MIL/uL — ABNORMAL LOW (ref 4.22–5.81)
RDW: 18.9 % — AB (ref 11.5–15.5)
WBC: 14.4 10*3/uL — ABNORMAL HIGH (ref 4.0–10.5)

## 2017-01-23 LAB — GLUCOSE, CAPILLARY
GLUCOSE-CAPILLARY: 123 mg/dL — AB (ref 65–99)
GLUCOSE-CAPILLARY: 102 mg/dL — AB (ref 65–99)
GLUCOSE-CAPILLARY: 120 mg/dL — AB (ref 65–99)
Glucose-Capillary: 105 mg/dL — ABNORMAL HIGH (ref 65–99)
Glucose-Capillary: 163 mg/dL — ABNORMAL HIGH (ref 65–99)

## 2017-01-23 MED ORDER — NITROGLYCERIN IN D5W 200-5 MCG/ML-% IV SOLN
INTRAVENOUS | Status: AC
  Filled 2017-01-23: qty 250

## 2017-01-23 MED ORDER — HYDRALAZINE HCL 20 MG/ML IJ SOLN
10.0000 mg | Freq: Once | INTRAMUSCULAR | Status: AC
  Administered 2017-01-23: 10 mg via INTRAVENOUS
  Filled 2017-01-23: qty 1

## 2017-01-23 MED ORDER — POLYVINYL ALCOHOL 1.4 % OP SOLN
1.0000 [drp] | Freq: Two times a day (BID) | OPHTHALMIC | Status: DC
  Administered 2017-01-23 – 2017-01-25 (×5): 1 [drp] via OPHTHALMIC
  Filled 2017-01-23: qty 15

## 2017-01-23 MED ORDER — NITROGLYCERIN IN D5W 200-5 MCG/ML-% IV SOLN
10.0000 ug/min | INTRAVENOUS | Status: DC
  Administered 2017-01-23: 10 ug/min via INTRAVENOUS
  Administered 2017-01-23: 35 ug/min via INTRAVENOUS
  Administered 2017-01-24: 50 ug/min via INTRAVENOUS
  Filled 2017-01-23 (×3): qty 250

## 2017-01-23 MED ORDER — SEVELAMER CARBONATE 800 MG PO TABS
1600.0000 mg | ORAL_TABLET | Freq: Three times a day (TID) | ORAL | Status: DC
  Administered 2017-01-23 – 2017-01-25 (×7): 1600 mg via ORAL
  Filled 2017-01-23 (×7): qty 2

## 2017-01-23 MED ORDER — CALCIUM ACETATE (PHOS BINDER) 667 MG PO CAPS
667.0000 mg | ORAL_CAPSULE | ORAL | Status: DC
  Administered 2017-01-23 – 2017-01-25 (×5): 667 mg via ORAL
  Filled 2017-01-23 (×2): qty 1

## 2017-01-23 MED ORDER — PSYLLIUM 95 % PO PACK
1.0000 | PACK | Freq: Every day | ORAL | Status: DC
  Administered 2017-01-23 – 2017-01-25 (×3): 1 via ORAL
  Filled 2017-01-23 (×4): qty 1

## 2017-01-23 MED ORDER — OXYCODONE HCL 5 MG PO TABS: 5.0000 mg | ORAL_TABLET | ORAL | Status: DC | PRN

## 2017-01-23 MED ORDER — NEPRO/CARBSTEADY PO LIQD
237.0000 mL | Freq: Two times a day (BID) | ORAL | Status: DC
  Administered 2017-01-23 – 2017-01-25 (×4): 237 mL via ORAL

## 2017-01-23 MED ORDER — CALCIUM ACETATE 667 MG PO CAPS
2001.0000 mg | ORAL_CAPSULE | Freq: Three times a day (TID) | ORAL | Status: DC
Start: 2017-01-23 — End: 2017-01-25
  Administered 2017-01-23 – 2017-01-25 (×7): 2001 mg via ORAL
  Filled 2017-01-23 (×17): qty 3

## 2017-01-23 MED ORDER — CLONIDINE HCL 0.2 MG PO TABS
0.2000 mg | ORAL_TABLET | Freq: Three times a day (TID) | ORAL | Status: DC
  Administered 2017-01-23 – 2017-01-25 (×7): 0.2 mg via ORAL
  Filled 2017-01-23 (×7): qty 1

## 2017-01-23 MED ORDER — HEPARIN SODIUM (PORCINE) 5000 UNIT/ML IJ SOLN
5000.0000 [IU] | Freq: Three times a day (TID) | INTRAMUSCULAR | Status: DC
  Administered 2017-01-23 – 2017-01-25 (×6): 5000 [IU] via SUBCUTANEOUS
  Filled 2017-01-23 (×7): qty 1

## 2017-01-23 MED ORDER — PANTOPRAZOLE SODIUM 40 MG PO TBEC
40.0000 mg | DELAYED_RELEASE_TABLET | Freq: Every day | ORAL | Status: DC
Start: 2017-01-23 — End: 2017-01-25
  Administered 2017-01-23 – 2017-01-25 (×3): 40 mg via ORAL
  Filled 2017-01-23 (×3): qty 1

## 2017-01-23 MED ORDER — LINACLOTIDE 145 MCG PO CAPS
145.0000 ug | ORAL_CAPSULE | Freq: Every day | ORAL | Status: DC
  Administered 2017-01-23 – 2017-01-25 (×3): 145 ug via ORAL
  Filled 2017-01-23 (×4): qty 1

## 2017-01-23 MED ORDER — DOXAZOSIN MESYLATE 2 MG PO TABS
8.0000 mg | ORAL_TABLET | Freq: Every day | ORAL | Status: DC
  Administered 2017-01-23 – 2017-01-25 (×3): 8 mg via ORAL
  Filled 2017-01-23 (×3): qty 4

## 2017-01-23 MED ORDER — INSULIN DETEMIR 100 UNIT/ML ~~LOC~~ SOLN
20.0000 [IU] | Freq: Every day | SUBCUTANEOUS | Status: DC
  Administered 2017-01-23: 20 [IU] via SUBCUTANEOUS
  Filled 2017-01-23 (×3): qty 0.2

## 2017-01-23 MED ORDER — FUROSEMIDE 40 MG PO TABS
40.0000 mg | ORAL_TABLET | Freq: Two times a day (BID) | ORAL | Status: DC
  Administered 2017-01-23 – 2017-01-25 (×5): 40 mg via ORAL
  Filled 2017-01-23 (×5): qty 1

## 2017-01-23 MED ORDER — GABAPENTIN 100 MG PO CAPS
100.0000 mg | ORAL_CAPSULE | Freq: Two times a day (BID) | ORAL | Status: DC
  Administered 2017-01-23 – 2017-01-25 (×5): 100 mg via ORAL
  Filled 2017-01-23 (×5): qty 1

## 2017-01-23 MED ORDER — INSULIN ASPART 100 UNIT/ML ~~LOC~~ SOLN
0.0000 [IU] | Freq: Three times a day (TID) | SUBCUTANEOUS | Status: DC
  Administered 2017-01-24: 1 [IU] via SUBCUTANEOUS

## 2017-01-23 MED ORDER — INSULIN ASPART 100 UNIT/ML ~~LOC~~ SOLN: 0.0000 [IU] | Freq: Every day | SUBCUTANEOUS | Status: DC

## 2017-01-23 MED ORDER — SIMVASTATIN 20 MG PO TABS
20.0000 mg | ORAL_TABLET | Freq: Every day | ORAL | Status: DC
  Administered 2017-01-23 – 2017-01-25 (×3): 20 mg via ORAL
  Filled 2017-01-23 (×3): qty 1

## 2017-01-23 MED ORDER — CINACALCET HCL 30 MG PO TABS
30.0000 mg | ORAL_TABLET | Freq: Every day | ORAL | Status: DC
  Administered 2017-01-23 – 2017-01-25 (×3): 30 mg via ORAL
  Filled 2017-01-23 (×4): qty 1

## 2017-01-23 MED ORDER — LINAGLIPTIN 5 MG PO TABS
5.0000 mg | ORAL_TABLET | Freq: Every day | ORAL | Status: DC
  Administered 2017-01-23 – 2017-01-25 (×3): 5 mg via ORAL
  Filled 2017-01-23 (×3): qty 1

## 2017-01-23 MED ORDER — QUETIAPINE FUMARATE 200 MG PO TABS
200.0000 mg | ORAL_TABLET | Freq: Every day | ORAL | Status: DC
  Administered 2017-01-23 – 2017-01-24 (×2): 200 mg via ORAL
  Filled 2017-01-23 (×3): qty 1

## 2017-01-23 MED ORDER — FLUOROMETHOLONE 0.1 % OP SUSP
1.0000 [drp] | Freq: Every day | OPHTHALMIC | Status: DC
  Administered 2017-01-23 – 2017-01-24 (×2): 1 [drp] via OPHTHALMIC
  Filled 2017-01-23: qty 5

## 2017-01-23 MED ORDER — BUPROPION HCL ER (XL) 150 MG PO TB24
150.0000 mg | ORAL_TABLET | Freq: Every day | ORAL | Status: DC
  Administered 2017-01-23 – 2017-01-25 (×3): 150 mg via ORAL
  Filled 2017-01-23 (×5): qty 1

## 2017-01-23 NOTE — Care Management Important Message (Signed)
Important Message  Patient Details  Name: Gerald Davenport MRN: [id number] Date of Birth: 1940-11-02   Medicare Important Message Given:  Yes    Sherald Barge, RN 01/23/2017, 1:30 PM

## 2017-01-23 NOTE — Progress Notes (Signed)
Initial Nutrition Assessment  DOCUMENTATION CODES:  Not applicable  INTERVENTION:  Continue Nepro BID. Can meet 100% needs with 1 of these and >75% meal intake  If pt agreeable, can f/u to educate on renal diet when more alert  NUTRITION DIAGNOSIS:  Increased nutrient needs related to chronic illness (ESRD on HD) as evidenced by estimated nutritional requirements for this treatment  GOAL:  Patient will meet greater than or equal to 90% of their needs  MONITOR:  PO intake, Supplement acceptance, Labs, Weight trends  REASON FOR ASSESSMENT:  Malnutrition Screening Tool    ASSESSMENT:  76 y/o male PMHx ESRD on HD, DM, ongoing tobacco abuse, Prostate Cancer, Anxiety, HTN, HLD, GERD, R BKA. Presents with SOB and DOE. Worked up for severe HTN and volume overload likely 2/2 flash pulmonary edema.   Pt is lethargic and difficult to understand. As such, could not obtain much history. He says he has been eating :all of his meals". He did not follow a renal diet and ate whatever he wanted. When asked about his weight loss, he says it is related to "the machine". He does not know his recent UBW, stating he was in the 160's years ago.   Per chart, despite being fluid overloaded at the moment, he has lost 10 lbs in the last month- was 155 lbs at an ED visit early April. Admitted to hosp 4 days ago at 137.5 lbs.   Pt denied having any food requests. He states he is eating well and nurse also reports the pt ate all of his breakfast. Will simply continue existing nepro order, he should be able to meet all his needs with 1 of these and >75% meal intake.   When more appropriate, can attempt education on renal diet, though admittedly expected compliance is low  Physical Exam: WDL, no wasting idenified  Medications: Phoslo, Sensipar, Nepro, Lasix, Insulin, LInzess,  ppi, seroquel, renvela, metamucil Labs: K: 5.3, BGs 100-150, WBC: 10.6. A1C from 12/11 was 5.1   Recent Labs Lab 01/19/17 0416  01/23/17 0231 01/23/17 0643  NA 133* 136 132*  K 4.1 4.7 5.3*  CL 93* 95* 95*  CO2 29 27 27   BUN 17 33* 35*  CREATININE 6.24* 8.85* 9.26*  CALCIUM 8.3* 8.7* 8.3*  GLUCOSE 148* 148* 135*   Diet Order:  Diet renal/carb modified with fluid restriction Diet-HS Snack? Nothing; Room service appropriate? Yes; Fluid consistency: Thin Diet Carb Modified Fluid consistency: Thin; Room service appropriate? Yes  Skin:  R bka  Last BM:  4/25  Height:  Ht Readings from Last 1 Encounters:  01/23/17 5\' 9"  (1.753 m)   Weight:  Wt Readings from Last 1 Encounters:  01/23/17 139 lb 15.9 oz (63.5 kg)   Wt Readings from Last 10 Encounters:  01/23/17 139 lb 15.9 oz (63.5 kg)  01/19/17 137 lb 9.1 oz (62.4 kg)  12/29/16 155 lb (70.3 kg)  11/11/16 154 lb (69.9 kg)  10/03/16 152 lb (68.9 kg)  09/09/16 150 lb 2.1 oz (68.1 kg)  06/27/16 153 lb (69.4 kg)  06/26/16 153 lb (69.4 kg)  05/29/16 150 lb (68 kg)  05/23/16 154 lb (69.9 kg)   Ideal Body Weight:  68 kg (Adjusted for bka)  BMI:  Adjusted Body mass index is 22.1 kg/m.  Estimated Nutritional Needs:  Kcal:  1900-2100 (30-33 kcal/kg bw) Protein:  83-95g Pro (1.3-1.5 g/kg bw) Fluid:  Per md  EDUCATION NEEDS:  Education needs no appropriate at this time  Burtis Junes RD, LDN,  CNSC Clinical Nutrition Pager: [phone] 01/23/2017 1:17 PM

## 2017-01-23 NOTE — ED Notes (Addendum)
Pt c/o SOB that started tonight after going to bed. Pt says he has been coughing some. Pt was admitted to hospital a week ago. Room air sats upon arrival was 78%. Pt placed on 4l of O2 and O2 sats returned to the 100%

## 2017-01-23 NOTE — Progress Notes (Addendum)
Patient was admitted early this AM after midnight and H and P has been reviewed and I am in agreement with the Assessment and Plan done by Dr. Orvan Falconer. Patient is a 76 yo AAM with a PMH of ESRD on HD MWF, HTN, DM, Hx of Prostate Cancer s/p treatment with Radiation Therapy, GERD, HLD, Anemia of Chronic Disease, Hyperparathyroidism and other comorbid conditions who presented to [facility] with a CC of SOB. Patient was recently discharged on 4/23 and presented again with SOB. States he has not missed any HD sessions but question compliance with Diet. Patient was found to be in Hypertensive Urgency and Volume overload (Likely from Flash Pulmonary Edema) so he was started on a NTG gtt and give IV Hydralazine. Patient also needed to be placed on BiPAP for his Acute Respiratory Failure and has now since weaned off of BiPAP. Nephrology was Consulted for maintence of ESRD and patient would benefit from Dialysis today. We will continue to closely monitor patient's clinical response to interventions and repeat blood work in AM.

## 2017-01-23 NOTE — ED Provider Notes (Signed)
[facility] DEPT Provider Note   CSN: [id number] Arrival date & time: 01/23/17  0158     History   Chief Complaint Chief Complaint  Patient presents with  . Shortness of Breath    HPI Gerald Davenport is a 76 y.o. male.  The history is provided by the patient and a significant other.  Shortness of Breath  This is a new problem. The problem occurs continuously.Episode onset: just prior to arrival. The problem has been rapidly worsening. Associated symptoms include cough. Pertinent negatives include no fever, no sputum production, no hemoptysis, no chest pain, no vomiting and no abdominal pain. He has tried nothing for the symptoms.  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 Anemia   . Anxiety   . Cancer [facility])    Prostate cancer treated with radiation therapy  . Chronic kidney disease    dialysis Mon, Wednes, Fri  . Clot 05/2006   Arterial clot right leg   . Constipation   . Diabetes mellitus    Type II  . Diverticulosis   . GERD (gastroesophageal reflux disease)   . Hemodialysis patient ([place]) 6/13  . Hyperlipidemia   . Hypertension   . Infection    drainage from distal incision of left upper arm arteriovenous graft  . Positive PPD   . Shortness of breath dyspnea    with exertion, when he has too much fluid  . 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 Thyroid disease    hyperparathyroidism  . Wears glasses     Patient Active Problem List   Diagnosis Date Noted  . SOB (shortness of breath) 01/18/2017  . Hypertensive urgency 01/18/2017  . Confusion 09/08/2016  . Hypoglycemia   . Atherosclerosis of native arteries of extremity with intermittent claudication ([place]) 05/23/2016  . Steal syndrome of dialysis vascular access  ([place]) 01/18/2016  . Infection 10/13/2015  . Infection of arteriovenous graft for hemodialysis ([place]) 10/12/2015  . ESRD on dialysis ([place]) 06/15/2015  . Diabetic neuropathy associated with type 2 diabetes mellitus ([place]) 05/25/2015  . Infection, dialysis vascular access ([place]) 04/19/2015  . Rectal bleeding 02/06/2015  . Dyspnea 03/24/2014  . Abnormal EKG 03/24/2014  . Essential hypertension, benign 03/15/2014  . Mixed hyperlipidemia 03/15/2014  . H/O adenomatous polyp of colon 11/07/2013  . Constipation 06/15/2013  . Encounter for screening colonoscopy 11/02/2012  . Fecal incontinence 09/10/2012  . GERD (gastroesophageal reflux disease) 09/10/2012  . Type 2 diabetes mellitus with ESRD (end-stage renal disease) ([place]) 08/06/2011  . CRF (chronic renal failure) 08/06/2011    Past Surgical History:  Procedure Laterality Date  . AV FISTULA PLACEMENT  11/04/2010   left and left  . AV FISTULA PLACEMENT  11/20/2011   Procedure: INSERTION OF ARTERIOVENOUS (AV) GORE-TEX GRAFT ARM;  Surgeon: Angelia Mould, MD;  Location: [place];  Service: Vascular;  Laterality: Right;  . AV FISTULA PLACEMENT Right 04/19/2015   Procedure: Excision of infected RUA AVG;  Surgeon: Conrad Sturgeon, MD;  Location: [place];  Service: Vascular;  Laterality: Right;  . AV FISTULA PLACEMENT Left 10/04/2015   Procedure: INSERTION OF ARTERIOVENOUS (AV) GORE-TEX GRAFT ARM;  Surgeon: Conrad , MD;  Location: [place];  Service: Vascular;  Laterality: Left;  . [place]  REMOVAL Left 10/13/2015   Procedure: REMOVAL OF INFECTED ARTERIOVENOUS GORETEX LEFT GRAFT ;  Surgeon: Conrad Cayuga, MD;  Location: [place];  Service: Vascular;  Laterality: Left;  . BASCILIC VEIN TRANSPOSITION Left 06/19/2015   Procedure: FIRST STAGE BRACHIAL VEIN TRANSPOSITION;  Surgeon: Conrad [place], MD;  Location: [place];  Service: Vascular;  Laterality: Left;  . 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 COLONOSCOPY N/A 11/10/2012   [facility]:ENIDPOEUM-PNTIRWE  proctitis/Pancolonic diverticulosis/(TUBULAR ADENOMA)5 mm polyp in the transverse segment/4 mm polyps in the base of the cecum and diminutive polyp in the descending segment. INADEQUATE BOWEL PREP  . COLONOSCOPY N/A 11/28/2013   RMR: Radiation proctitis. Colonic diverticulosis. Colonic polyps-removed as described above.   . INSERTION OF DIALYSIS CATHETER Right 01/19/2016   Procedure: INSERTION OF Right internal jugular  DIALYSIS CATHETER.;  Surgeon: Elam Dutch, MD;  Location: [place];  Service: Vascular;  Laterality: Right;  . PERIPHERAL VASCULAR CATHETERIZATION Bilateral 12/06/2015   Procedure: Upper Extremity Venography/ central venogram;  Surgeon: Conrad Lehigh, MD;  Location: [place] CV LAB;  Service: Cardiovascular;  Laterality: Bilateral;  . PERIPHERAL VASCULAR CATHETERIZATION N/A 05/29/2016   Procedure: Upper Extremity Angiography;  Surgeon: Conrad Jenner, MD;  Location: [place] CV LAB;  Service: Cardiovascular;  Laterality: N/A;  . PERIPHERAL VASCULAR CATHETERIZATION N/A 05/29/2016   Procedure: Lower Extremity Angiography;  Surgeon: Conrad Chester, MD;  Location: [place] CV LAB;  Service: Cardiovascular;  Laterality: N/A;  . PERIPHERAL VASCULAR CATHETERIZATION  05/29/2016   Procedure: Peripheral Vascular Intervention;  Surgeon: Conrad Franklin, MD;  Location: [place] CV LAB;  Service: Cardiovascular;;  . REMOVAL OF A HERO DEVICE Right 01/19/2016   Procedure: REMOVAL OF A HERO DEVICE;  Surgeon: Elam Dutch, MD;  Location: [place];  Service: Vascular;  Laterality: Right;  . SHUNTOGRAM N/A 10/08/2012   Procedure: [name];  Surgeon: Elam Dutch, MD;  Location: [facility] CATH LAB;  Service: Cardiovascular;  Laterality: N/A;  . VASCULAR ACCESS DEVICE INSERTION Right 01/01/2016   Procedure: INSERTION OF HERO VASCULAR ACCESS DEVICE - RIGHT;  Surgeon: Conrad , MD;  Location: [place];  Service: Vascular;  Laterality: Right;       Home Medications    Prior to Admission medications     Medication Sig Start Date End Date Taking? Authorizing Provider  acetaminophen-codeine (TYLENOL #3) 300-30 MG tablet Take 1 tablet by mouth every 4 (four) hours as needed for moderate pain. 01/19/16   Elam Dutch, MD  buPROPion (WELLBUTRIN XL) 150 MG 24 hr tablet Take 150 mg by mouth daily. 03/05/15   Historical Provider, MD  calcium acetate (PHOSLO) 667 MG capsule Take 667-2,001 mg by mouth See admin instructions. Take 2001 mg by mouth 3 times daily. Take 667 mg by mouth with snacks. 10/26/12   Historical Provider, MD  cloNIDine (CATAPRES) 0.2 MG tablet Take 1 tablet (0.2 mg total) by mouth 3 (three) times daily. 01/19/16   [name], MD  doxazosin (CARDURA) 8 MG tablet Take 8 mg by mouth daily. Reported on 02/26/2016    Historical Provider, MD  fluorometholone (FML) 0.1 % ophthalmic suspension Place 1 drop into both eyes at bedtime. 04/15/16   Historical Provider, MD  furosemide (LASIX) 40 MG tablet Take 40 mg by mouth 2 (two) times daily.     Historical Provider, MD  gabapentin (NEURONTIN) 100 MG capsule Take 100 mg by mouth 2 (two) times daily. 04/13/15   Historical Provider, MD  Insulin Detemir (LEVEMIR FLEXTOUCH) 100 UNIT/ML Pen Inject 20 Units into the skin daily at 10 pm.    Historical Provider, MD  Linaclotide (LINZESS) 145 MCG CAPS capsule Take 1 capsule (145 mcg total) by mouth daily. Take 30 minutes before breakfast 02/06/15   Carlis Stable, NP  omeprazole (PRILOSEC) 20 MG capsule Take 20 mg by mouth daily.  02/20/15   Historical Provider, MD  oxyCODONE (OXY IR/ROXICODONE) 5 MG immediate release tablet Take 5 mg by mouth every 4 (four) hours as needed for severe pain.    Historical Provider, MD  Polyvinyl Alcohol-Povidone (REFRESH OP) Place 1 drop into both eyes at bedtime.    Historical Provider, MD  psyllium (METAMUCIL) 58.6 % powder Take 1 packet by mouth as needed (for stool).     Historical Provider, MD  QUEtiapine (SEROQUEL) 100 MG tablet Take 200 mg by mouth at bedtime.  03/05/15    Historical Provider, MD  saxagliptin HCl (ONGLYZA) 5 MG TABS tablet Take 5 mg by mouth daily with breakfast.     Historical Provider, MD  SENSIPAR 30 MG tablet Take 30 mg by mouth daily.  11/20/15   Historical Provider, MD  sevelamer carbonate (RENVELA) 800 MG tablet Take 800-1,600 mg by mouth 3 (three) times daily with meals.    Historical Provider, MD  simvastatin (ZOCOR) 20 MG tablet Take 20 mg by mouth daily.    Historical Provider, MD    Family History Family History  Problem Relation Age of Onset  . Heart disease Mother   . Diabetes Mother   . Hypertension Mother   . Stroke Mother   . Heart disease Father   . Diabetes Father   . Heart disease Sister   . Diabetes Brother   . Colon cancer Neg Hx     Social History Social History  Substance Use Topics  . Smoking status: Light Tobacco Smoker    Packs/day: 0.25    Years: 50.00    Types: Cigarettes  . Smokeless tobacco: Never Used     Comment: 4-5 cigarettes daily  . Alcohol use No     Allergies   Patient has no known allergies.   Review of Systems Review of Systems  Constitutional: Negative for fever.  Respiratory: Positive for cough and shortness of breath. Negative for hemoptysis and sputum production.   Cardiovascular: Negative for chest pain.  Gastrointestinal: Negative for abdominal pain and vomiting.  All other systems reviewed and are negative.    Physical Exam Updated Vital Signs BP (!) 211/106 (BP Location: Left Arm)   Pulse (!) 106   Temp 97.7 F (36.5 C) (Oral)   Resp (!) 24   Ht 5\' 9"  (1.753 m)   Wt 63.5 kg   SpO2 95%   BMI 20.67 kg/m   Physical Exam CONSTITUTIONAL: Elderly, chronically ill appearing HEAD: Normocephalic/atraumatic EYES: EOMI/PERRL ENMT: Mucous membranes moist NECK: supple no meningeal signs, +JVD SPINE/BACK:entire spine nontender CV: S1/S2 noted, no murmurs/rubs/gallops noted LUNGS:mild tachypnea noted, crackles noted bilaterally ABDOMEN: soft, nontender, no rebound or  guarding, bowel sounds noted throughout abdomen GU:no cva tenderness NEURO: Pt is  Resting with eyes close but arousable, moves all extremitiesx4.  No facial droop.   EXTREMITIES: pulses normal/equal, full ROM, dialysis access to right UE, right BKA with prosthesis noted SKIN: warm, color normal, dialysis catheter to right chest PSYCH: no abnormalities of mood noted, alert and oriented to situation   ED Treatments / Results  Labs (all labs ordered are listed, but only abnormal  results are displayed) Labs Reviewed  BASIC METABOLIC PANEL - Abnormal; Notable for the following:       Result Value   Chloride 95 (*)    Glucose, Bld 148 (*)    BUN 33 (*)    Creatinine, Ser 8.85 (*)    Calcium 8.7 (*)    GFR calc non Af Amer 5 (*)    GFR calc Af Amer 6 (*)    All other components within normal limits  CBC WITH DIFFERENTIAL/PLATELET - Abnormal; Notable for the following:    WBC 14.4 (*)    RBC 3.21 (*)    Hemoglobin 11.3 (*)    HCT 35.2 (*)    MCV 109.7 (*)    MCH 35.2 (*)    RDW 18.9 (*)    Neutro Abs 12.0 (*)    All other components within normal limits  BLOOD GAS, ARTERIAL - Abnormal; Notable for the following:    Acid-Base Excess 2.8 (*)    Allens test (pass/fail) POSITIVE (*)    All other components within normal limits    EKG  EKG Interpretation  Date/Time:  Friday January 23 2017 02:10:25 EDT Ventricular Rate:  106 PR Interval:    QRS Duration: 89 QT Interval:  386 QTC Calculation: 513 R Axis:   58 Text Interpretation:  Sinus tachycardia Borderline repolarization abnormality Prolonged QT interval Confirmed by Christy Gentles  MD, Geovonni Meyerhoff ([id number]) on 01/23/2017 2:14:08 AM       Radiology Dg Chest Port 1 View  Result Date: 01/23/2017 CLINICAL DATA:  76 year old male with shortness of breath. EXAM: PORTABLE CHEST 1 VIEW COMPARISON:  Chest radiograph dated 01/18/2017 FINDINGS: A right-sided dialysis catheter remains in stable position. Probable mild interval improvement of the  vascular congestion. No focal consolidation, or pneumothorax. Probable trace right pleural effusion. Stable mild cardiomegaly. No acute osseous pathology. A right axillary vascular stent noted. IMPRESSION: Slight interval improvement of the vascular congestion compared to the prior radiograph. No focal consolidation. Electronically Signed   By: Anner Crete M.D.   On: 01/23/2017 03:10    Procedures Procedures  CRITICAL CARE Performed by: Sharyon Cable Total critical care time: 31 minutes Critical care time was exclusive of separately billable procedures and treating other patients. Critical care was necessary to treat or prevent imminent or life-threatening deterioration. Critical care was time spent personally by me on the following activities: development of treatment plan with patient and/or surrogate as well as nursing, discussions with consultants, evaluation of patient's response to treatment, examination of patient, obtaining history from patient or surrogate, ordering and performing treatments and interventions, ordering and review of laboratory studies, ordering and review of radiographic studies, pulse oximetry and re-evaluation of patient's condition. PATIENT WITH ACUTE HYPOXIC RESPIRATORY FAILURE (PULSE OX =81%) WHO REQUIRED BIPAP AND ADMISSION   Medications Ordered in ED Medications  nitroGLYCERIN 50 mg in dextrose 5 % 250 mL (0.2 mg/mL) infusion (30 mcg/min Intravenous Rate/Dose Change 01/23/17 0327)     Initial Impression / Assessment and Plan / ED Course  I have reviewed the triage vital signs and the nursing notes.  Pertinent labs & imaging results that were available during my care of the patient were reviewed by me and considered in my medical decision making (see chart for details).     3:28 AM PT PRESENTED WITH ACUTE SHORTNESS OF BREATH, HYPOXIC, HYPERTENSIVE EMERGENCY PT GIVEN IV NITRO DRIP DUE TO HIS TACHYPNEA, I PLACED HIM ON BIPAP AND HE IMPROVED  HE  ALSO WAS NOTED TO HAVE  HYPOXIA ON ROOM AIR INITIALLY, THIS IS IMPROVED CLINICALLY HE APPEARS TO ACUTE RESPIRATORY FAILURE DUE TO PULMONARY EDEMA, THOUGH INITIAL CXR DOES NOT INDICATED THIS HOWEVER HE IS IMPROVED DOUBT ACS/PE AT THIS TIME  D/W DR Collier [place] LE FOR ADMISSION   Final Clinical Impressions(s) / ED Diagnoses   Final diagnoses:  Acute respiratory failure with hypoxia ([place])  Chronic renal failure, stage 5 ([facility])    New Prescriptions New Prescriptions   No medications on file     Ripley Fraise, MD 01/23/17 0330

## 2017-01-23 NOTE — H&P (Signed)
History and Physical    Gerald Davenport WGN:[id number] DOB: 07-11-1941 DOA: 01/23/2017  PCP: Donnie Coffin, MD  Patient coming from: Home.    Chief Complaint:   SOB.   HPI:  Gerald Davenport is a 76 y.o. male with h/o ESRD, HTN and DM who comes in today with SOB. He has not missed any HD sessions recently. He was recently admitted into the hospital and discharge a few days ago.  He has noticed increased orthopnea and having DOE.  No fever, chills, or productive coughs. He has not gotten any chest pain, and unfortunately still smoking.  Evaluation in the ER showed severe HTN with SBP 220/105, Cr of 8.8, normal K, BS 148, troponins of 0.09, and EKG showed ST with no acute ST T changes.  He was started on Bipap, and IV NTG along with IV Hydralazine.  His BP was resistant, and improved to about 784 systolic.   He is more comfortable with his breathing however.   ED Course:  See above.  Rewiew of Systems:  Constitutional: Negative for malaise, fever and chills. No significant weight loss or weight gain Eyes: Negative for eye pain, redness and discharge, diplopia, visual changes, or flashes of light. ENMT: Negative for ear pain, hoarseness, nasal congestion, sinus pressure and sore throat. No headaches; tinnitus, drooling, or problem swallowing. Cardiovascular: Negative for chest pain, palpitations, diaphoresis, and peripheral edema. ;  Respiratory: Negative for cough, hemoptysis, wheezing and stridor. No pleuritic chestpain. Gastrointestinal: Negative for diarrhea, constipation,  melena, blood in stool, hematemesis, jaundice and rectal bleeding.    Genitourinary: Negative for frequency, dysuria, incontinence,flank pain and hematuria; Musculoskeletal: Negative for back pain and neck pain. Negative for swelling and trauma.;  Skin: . Negative for pruritus, rash, abrasions, bruising and skin lesion.; ulcerations Neuro: Negative for headache, lightheadedness and neck stiffness. Negative for weakness, altered  level of consciousness , altered mental status, extremity weakness, burning feet, involuntary movement, seizure and syncope.  Psych: negative for anxiety, depression, insomnia, tearfulness, panic attacks, hallucinations, paranoia, suicidal or homicidal ideation   Past Medical History:  Diagnosis Date  . Anemia   . Anxiety   . Cancer [facility])    Prostate cancer treated with radiation therapy  . Chronic kidney disease    dialysis Mon, Wednes, Fri  . Clot 05/2006   Arterial clot right leg   . Constipation   . Diabetes mellitus    Type II  . Diverticulosis   . GERD (gastroesophageal reflux disease)   . Hemodialysis patient ([place]) 6/13  . Hyperlipidemia   . Hypertension   . Infection    drainage from distal incision of left upper arm arteriovenous graft  . Positive PPD   . Shortness of breath dyspnea    with exertion, when he has too much fluid  . Steal syndrome dialysis vascular access ([place])   . Thyroid disease    hyperparathyroidism  . Wears glasses     Past Surgical History:  Procedure Laterality Date  . AV FISTULA PLACEMENT  11/04/2010   left and left  . AV FISTULA PLACEMENT  11/20/2011   Procedure: INSERTION OF ARTERIOVENOUS (AV) GORE-TEX GRAFT ARM;  Surgeon: Angelia Mould, MD;  Location: [place];  Service: Vascular;  Laterality: Right;  . AV FISTULA PLACEMENT Right 04/19/2015   Procedure: Excision of infected RUA AVG;  Surgeon: Conrad Spotswood, MD;  Location: [place];  Service: Vascular;  Laterality: Right;  . AV FISTULA PLACEMENT Left 10/04/2015   Procedure: INSERTION OF ARTERIOVENOUS (  AV) GORE-TEX GRAFT ARM;  Surgeon: Conrad Lock Haven, MD;  Location: [place];  Service: Vascular;  Laterality: Left;  . [place] REMOVAL Left 10/13/2015   Procedure: REMOVAL OF INFECTED ARTERIOVENOUS GORETEX LEFT GRAFT ;  Surgeon: Conrad [place], MD;  Location: [place];  Service: Vascular;  Laterality: Left;  . BASCILIC VEIN TRANSPOSITION Left 06/19/2015   Procedure: FIRST STAGE BRACHIAL VEIN TRANSPOSITION;   Surgeon: Conrad Cerulean, MD;  Location: [place];  Service: Vascular;  Laterality: Left;  . 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 COLONOSCOPY N/A 11/10/2012   GEX:[id number] proctitis/Pancolonic diverticulosis/(TUBULAR ADENOMA)5 mm polyp in the transverse segment/4 mm polyps in the base of the cecum and diminutive polyp in the descending segment. INADEQUATE BOWEL PREP  . COLONOSCOPY N/A 11/28/2013   RMR: Radiation proctitis. Colonic diverticulosis. Colonic polyps-removed as described above.   . INSERTION OF DIALYSIS CATHETER Right 01/19/2016   Procedure: INSERTION OF Right internal jugular  DIALYSIS CATHETER.;  Surgeon: Elam Dutch, MD;  Location: [place];  Service: Vascular;  Laterality: Right;  . PERIPHERAL VASCULAR CATHETERIZATION Bilateral 12/06/2015   Procedure: Upper Extremity Venography/ central venogram;  Surgeon: Conrad [place], MD;  Location: [place] CV LAB;  Service: Cardiovascular;  Laterality: Bilateral;  . PERIPHERAL VASCULAR CATHETERIZATION N/A 05/29/2016   Procedure: Upper Extremity Angiography;  Surgeon: Conrad Hunnewell, MD;  Location: [place] [facility];  Service: Cardiovascular;  Laterality: N/A;  . PERIPHERAL VASCULAR CATHETERIZATION N/A 05/29/2016   Procedure: Lower Extremity Angiography;  Surgeon: Conrad Edgerton, MD;  Location: [place] [facility];  Service: Cardiovascular;  Laterality: N/A;  . PERIPHERAL VASCULAR CATHETERIZATION  05/29/2016   Procedure: Peripheral Vascular Intervention;  Surgeon: Conrad Blod[place] Mills, MD;  Location: [place] [facility];  Service: Cardiovascular;;  . REMOVAL OF A HERO DEVICE Right 01/19/2016   Procedure: REMOVAL OF A HERO DEVICE;  Surgeon: Elam Dutch, MD;  Location: [place];  Service: Vascular;  Laterality: Right;  . SHUNTOGRAM N/A 10/08/2012   Procedure: [name];  Surgeon: Elam Dutch, MD;  Location: [facility] CATH LAB;  Service: Cardiovascular;  Laterality: N/A;  . VASCULAR ACCESS DEVICE INSERTION Right 01/01/2016   Procedure: INSERTION OF HERO  VASCULAR ACCESS DEVICE - RIGHT;  Surgeon: Conrad Garland, MD;  Location: [place];  Service: Vascular;  Laterality: Right;     reports that he has been smoking Cigarettes.  He has a 12.50 pack-year smoking history. He has never used smokeless tobacco. He reports that he does not drink alcohol or use drugs.  No Known Allergies  Family History  Problem Relation Age of Onset  . Heart disease Mother   . Diabetes Mother   . Hypertension Mother   . Stroke Mother   . Heart disease Father   . Diabetes Father   . Heart disease Sister   . Diabetes Brother   . Colon cancer Neg Hx      Prior to Admission medications   Medication Sig Start Date End Date Taking? Authorizing Provider  acetaminophen-codeine (TYLENOL #3) 300-30 MG tablet Take 1 tablet by mouth every 4 (four) hours as needed for moderate pain. 01/19/16   Elam Dutch, MD  buPROPion (WELLBUTRIN XL) 150 MG 24 hr tablet Take 150 mg by mouth daily. 03/05/15   Historical Provider, MD  calcium acetate (PHOSLO) 667 MG capsule Take 667-2,001 mg by mouth See admin instructions. Take 2001 mg by mouth 3 times daily. Take 667 mg by mouth with snacks. 10/26/12  Historical Provider, MD  cloNIDine (CATAPRES) 0.2 MG tablet Take 1 tablet (0.2 mg total) by mouth 3 (three) times daily. 01/19/16   [name], MD  doxazosin (CARDURA) 8 MG tablet Take 8 mg by mouth daily. Reported on 02/26/2016    Historical Provider, MD  fluorometholone (FML) 0.1 % ophthalmic suspension Place 1 drop into both eyes at bedtime. 04/15/16   Historical Provider, MD  furosemide (LASIX) 40 MG tablet Take 40 mg by mouth 2 (two) times daily.     Historical Provider, MD  gabapentin (NEURONTIN) 100 MG capsule Take 100 mg by mouth 2 (two) times daily. 04/13/15   Historical Provider, MD  Insulin Detemir (LEVEMIR FLEXTOUCH) 100 UNIT/ML Pen Inject 20 Units into the skin daily at 10 pm.    Historical Provider, MD  Linaclotide (LINZESS) 145 MCG CAPS capsule Take 1 capsule (145 mcg total) by mouth  daily. Take 30 minutes before breakfast 02/06/15   Carlis Stable, NP  omeprazole (PRILOSEC) 20 MG capsule Take 20 mg by mouth daily.  02/20/15   Historical Provider, MD  oxyCODONE (OXY IR/ROXICODONE) 5 MG immediate release tablet Take 5 mg by mouth every 4 (four) hours as needed for severe pain.    Historical Provider, MD  Polyvinyl Alcohol-Povidone (REFRESH OP) Place 1 drop into both eyes at bedtime.    Historical Provider, MD  psyllium (METAMUCIL) 58.6 % powder Take 1 packet by mouth as needed (for stool).     Historical Provider, MD  QUEtiapine (SEROQUEL) 100 MG tablet Take 200 mg by mouth at bedtime.  03/05/15   Historical Provider, MD  saxagliptin HCl (ONGLYZA) 5 MG TABS tablet Take 5 mg by mouth daily with breakfast.     Historical Provider, MD  SENSIPAR 30 MG tablet Take 30 mg by mouth daily.  11/20/15   Historical Provider, MD  sevelamer carbonate (RENVELA) 800 MG tablet Take 800-1,600 mg by mouth 3 (three) times daily with meals.    Historical Provider, MD  simvastatin (ZOCOR) 20 MG tablet Take 20 mg by mouth daily.    Historical Provider, MD    Physical Exam: Vitals:   01/23/17 0230 01/23/17 0245 01/23/17 0300 01/23/17 0315  BP: (!) 226/105 (!) 230/103 (!) 221/101 (!) 218/98  Pulse: (!) 103 (!) 105 (!) 106 (!) 104  Resp: (!) 23 (!) 22 (!) 23 17  Temp:      TempSrc:      SpO2: 100% 100% 99% 97%  Weight:      Height:          Constitutional: NAD, calm, comfortable Vitals:   01/23/17 0230 01/23/17 0245 01/23/17 0300 01/23/17 0315  BP: (!) 226/105 (!) 230/103 (!) 221/101 (!) 218/98  Pulse: (!) 103 (!) 105 (!) 106 (!) 104  Resp: (!) 23 (!) 22 (!) 23 17  Temp:      TempSrc:      SpO2: 100% 100% 99% 97%  Weight:      Height:       Eyes: PERRL, lids and conjunctivae normal ENMT: Mucous membranes are moist. Posterior pharynx clear of any exudate or lesions.Normal dentition.  Neck: normal, supple, no masses, no thyromegaly Respiratory: clear to auscultation bilaterally, no  wheezing, bibasilar rales audible.  Normal respiratory effort. No accessory muscle use.  Cardiovascular: Regular rate and rhythm, no murmurs / rubs / gallops. No extremity edema. 2+ pedal pulses. No carotid bruits.  Abdomen: no tenderness, no masses palpated. No hepatosplenomegaly. Bowel sounds positive.  Musculoskeletal: no clubbing / cyanosis. No  joint deformity upper and lower extremities. Good ROM, no contractures. Normal muscle tone.  Skin: no rashes, lesions, ulcers. No induration Neurologic: CN 2-12 grossly intact. Sensation intact, DTR normal. Strength 5/5 in all 4.  Psychiatric: Normal judgment and insight. Alert and oriented x 3. Normal mood.     Labs on Admission: I have personally reviewed following labs and imaging studies  CBC:  Recent Labs Lab 01/18/17 0500 01/23/17 0231  WBC 14.3* 14.4*  NEUTROABS 11.2* 12.0*  HGB 10.6* 11.3*  HCT 32.7* 35.2*  MCV 109.0* 109.7*  PLT 258 921   Basic Metabolic Panel:  Recent Labs Lab 01/18/17 0500 01/19/17 0416 01/23/17 0231  NA 135 133* 136  K 4.0 4.1 4.7  CL 94* 93* 95*  CO2 28 29 27   GLUCOSE 177* 148* 148*  BUN 24* 17 33*  CREATININE 7.87* 6.24* 8.85*  CALCIUM 8.5* 8.3* 8.7*   GFR: Estimated Creatinine Clearance: 6.3 mL/min (A) (by C-G formula based on SCr of 8.85 mg/dL (H)). Liver Function Tests:  Recent Labs Lab 01/18/17 0500  AST 14*  ALT 8*  ALKPHOS 70  BILITOT 0.5  PROT 7.8  ALBUMIN 3.6   Cardiac Enzymes:  Recent Labs Lab 01/18/17 1318 01/18/17 1900 01/19/17 0038  TROPONINI 0.08* 0.08* 0.09*   BNP (last 3 results) CBG:  Recent Labs Lab 01/18/17 1221 01/18/17 1643 01/18/17 2057 01/19/17 0727 01/19/17 1130  GLUCAP 129* 144* 147* 126* 148*   Lipid Profile:  Recent Results (from the past 240 hour(s))  MRSA PCR Screening     Status: None   Collection Time: 01/18/17 12:27 PM  Result Value Ref Range Status   MRSA by PCR NEGATIVE NEGATIVE Final    Comment:        The GeneXpert MRSA  Assay (FDA approved for NASAL specimens only), is one component of a comprehensive MRSA colonization surveillance program. It is not intended to diagnose MRSA infection nor to guide or monitor treatment for MRSA infections.      Radiological Exams on Admission: Dg Chest Port 1 View  Result Date: 01/23/2017 CLINICAL DATA:  76 year old male with shortness of breath. EXAM: PORTABLE CHEST 1 VIEW COMPARISON:  Chest radiograph dated 01/18/2017 FINDINGS: A right-sided dialysis catheter remains in stable position. Probable mild interval improvement of the vascular congestion. No focal consolidation, or pneumothorax. Probable trace right pleural effusion. Stable mild cardiomegaly. No acute osseous pathology. A right axillary vascular stent noted. IMPRESSION: Slight interval improvement of the vascular congestion compared to the prior radiograph. No focal consolidation. Electronically Signed   By: Anner Crete M.D.   On: 01/23/2017 03:10    EKG: Independently reviewed.  Assessment/Plan Principal Problem:   SOB (shortness of breath) Active Problems:   Type 2 diabetes mellitus with ESRD (end-stage renal disease) (HCC)   CRF (chronic renal failure)   Mixed hyperlipidemia   ESRD on dialysis ([place])   Atherosclerosis of native arteries of extremity with intermittent claudication (HCC)   Hypertensive urgency    PLAN:   SOB:  I suspect he has HTN pulmonary edema.  Will continue with IV NTG, and Bipap.  Admit to the ICU.  He will benefit getting dialysis later today.  Will continue with his meds.  ESRD on HD:  Will consult nephrology for dialysis later today.  DM2:  Will continue meds and use sensitive insulin scale.   HTN:  Resistant.   WIll continue with his meds.  Further adjustment to avoid HTN pulmonary edema.  Elevated troponins:  Doubt ACS.  Tobacco abuse:  Advised stop.  Low probability.    DVT prophylaxis: subQ hep. Code Status: FULL CODE.  Family Communication: wife at  bedside.  Disposition Plan: home.  Consults called: None. Admission status: Inpatient.    Heiley Shaikh MD FACP. [facility]  If 7PM-7AM, please contact night-coverage [email or web address] Password [facility]  01/23/2017, 3:49 AM

## 2017-01-23 NOTE — Progress Notes (Signed)
Nephro consulted, Dr.Befekadu paged x 2 times this shift

## 2017-01-23 NOTE — ED Triage Notes (Signed)
Pt states he started feeling sob when he laid down to bed tonight.  Pt denies cp, states he has had a productive cough.

## 2017-01-24 DIAGNOSIS — K5900 Constipation, unspecified: Secondary | ICD-10-CM

## 2017-01-24 DIAGNOSIS — J9600 Acute respiratory failure, unspecified whether with hypoxia or hypercapnia: Secondary | ICD-10-CM

## 2017-01-24 DIAGNOSIS — K219 Gastro-esophageal reflux disease without esophagitis: Secondary | ICD-10-CM

## 2017-01-24 DIAGNOSIS — E1149 Type 2 diabetes mellitus with other diabetic neurological complication: Secondary | ICD-10-CM

## 2017-01-24 LAB — CBC WITH DIFFERENTIAL/PLATELET
BASOS ABS: 0 10*3/uL (ref 0.0–0.1)
Basophils Relative: 0 %
Eosinophils Absolute: 0 10*3/uL (ref 0.0–0.7)
Eosinophils Relative: 0 %
HEMATOCRIT: 31.5 % — AB (ref 39.0–52.0)
Hemoglobin: 10.5 g/dL — ABNORMAL LOW (ref 13.0–17.0)
Lymphocytes Relative: 6 %
Lymphs Abs: 0.6 10*3/uL — ABNORMAL LOW (ref 0.7–4.0)
MCH: 35.8 pg — ABNORMAL HIGH (ref 26.0–34.0)
MCHC: 33.3 g/dL (ref 30.0–36.0)
MCV: 107.5 fL — ABNORMAL HIGH (ref 78.0–100.0)
MONO ABS: 0.5 10*3/uL (ref 0.1–1.0)
Monocytes Relative: 6 %
NEUTROS ABS: 8.4 10*3/uL — AB (ref 1.7–7.7)
Neutrophils Relative %: 88 %
Platelets: 202 10*3/uL (ref 150–400)
RBC: 2.93 MIL/uL — AB (ref 4.22–5.81)
RDW: 18.3 % — AB (ref 11.5–15.5)
WBC: 9.6 10*3/uL (ref 4.0–10.5)

## 2017-01-24 LAB — COMPREHENSIVE METABOLIC PANEL
ALBUMIN: 3.2 g/dL — AB (ref 3.5–5.0)
ALT: 6 U/L — ABNORMAL LOW (ref 17–63)
AST: 13 U/L — AB (ref 15–41)
Alkaline Phosphatase: 59 U/L (ref 38–126)
Anion gap: 13 (ref 5–15)
BILIRUBIN TOTAL: 0.5 mg/dL (ref 0.3–1.2)
BUN: 43 mg/dL — AB (ref 6–20)
CHLORIDE: 93 mmol/L — AB (ref 101–111)
CO2: 25 mmol/L (ref 22–32)
Calcium: 8.7 mg/dL — ABNORMAL LOW (ref 8.9–10.3)
Creatinine, Ser: 10.9 mg/dL — ABNORMAL HIGH (ref 0.61–1.24)
GFR calc Af Amer: 5 mL/min — ABNORMAL LOW (ref >60)
GFR calc non Af Amer: 4 mL/min — ABNORMAL LOW (ref >60)
Glucose, Bld: 101 mg/dL — ABNORMAL HIGH (ref 65–99)
POTASSIUM: 5.1 mmol/L (ref 3.5–5.1)
Sodium: 131 mmol/L — ABNORMAL LOW (ref 135–145)
TOTAL PROTEIN: 6.9 g/dL (ref 6.5–8.1)

## 2017-01-24 LAB — GLUCOSE, CAPILLARY
GLUCOSE-CAPILLARY: 41 mg/dL — AB (ref 65–99)
GLUCOSE-CAPILLARY: 98 mg/dL (ref 65–99)
Glucose-Capillary: 133 mg/dL — ABNORMAL HIGH (ref 65–99)
Glucose-Capillary: 97 mg/dL (ref 65–99)
Glucose-Capillary: 56 mg/dL — ABNORMAL LOW (ref 65–99)
Glucose-Capillary: 133 mg/dL — ABNORMAL HIGH (ref 65–99)

## 2017-01-24 LAB — PHOSPHORUS: Phosphorus: 3.3 mg/dL (ref 2.5–4.6)

## 2017-01-24 LAB — MAGNESIUM: Magnesium: 2.2 mg/dL (ref 1.7–2.4)

## 2017-01-24 MED ORDER — HEPARIN SODIUM (PORCINE) 1000 UNIT/ML IJ SOLN
INTRAMUSCULAR | Status: AC
  Administered 2017-01-24: 1300 [IU] via INTRAVENOUS_CENTRAL
  Filled 2017-01-24: qty 8

## 2017-01-24 MED ORDER — HEPARIN SODIUM (PORCINE) 1000 UNIT/ML DIALYSIS
1000.0000 [IU] | INTRAMUSCULAR | Status: DC | PRN
  Filled 2017-01-24: qty 1

## 2017-01-24 MED ORDER — EPOETIN ALFA 2000 UNIT/ML IJ SOLN
INTRAMUSCULAR | Status: AC
  Administered 2017-01-24: 2000 [IU] via INTRAVENOUS
  Filled 2017-01-24: qty 1

## 2017-01-24 MED ORDER — ALTEPLASE 2 MG IJ SOLR
2.0000 mg | Freq: Once | INTRAMUSCULAR | Status: DC | PRN
  Filled 2017-01-24: qty 2

## 2017-01-24 MED ORDER — DEXTROSE 50 % IV SOLN
INTRAVENOUS | Status: AC
  Administered 2017-01-24: 25 mL
  Filled 2017-01-24: qty 50

## 2017-01-24 MED ORDER — SODIUM CHLORIDE 0.9 % IV SOLN
100.0000 mL | INTRAVENOUS | Status: DC | PRN
  Administered 2017-01-24: 100 mL via INTRAVENOUS

## 2017-01-24 MED ORDER — HEPARIN SODIUM (PORCINE) 1000 UNIT/ML DIALYSIS
20.0000 [IU]/kg | INTRAMUSCULAR | Status: DC | PRN
  Administered 2017-01-24: 1300 [IU] via INTRAVENOUS_CENTRAL
  Filled 2017-01-24 (×2): qty 2

## 2017-01-24 MED ORDER — EPOETIN ALFA 2000 UNIT/ML IJ SOLN
2000.0000 [IU] | Freq: Once | INTRAMUSCULAR | Status: AC
  Administered 2017-01-24: 2000 [IU] via INTRAVENOUS
  Filled 2017-01-24: qty 1

## 2017-01-24 MED ORDER — SODIUM CHLORIDE 0.9 % IV SOLN: 100.0000 mL | INTRAVENOUS | Status: DC | PRN

## 2017-01-24 NOTE — Progress Notes (Signed)
PROGRESS NOTE    Gerald Davenport  ZOX:[id number] DOB: 1941-06-05 DOA: 01/23/2017 PCP: Harriett Sine, MD  Brief Narrative:  Patient is a 76 yo AAM with a PMH of ESRD on HD MWF, HTN, DM, Hx of Prostate Cancer s/p treatment with Radiation Therapy, GERD, HLD, Anemia of Chronic Disease, Hyperparathyroidism and other comorbid conditions who presented to [facility] with a CC of SOB. Patient was recently discharged on 4/23 and presented again with SOB. States he has not missed any HD sessions but question compliance with Diet. Patient was found to be in Hypertensive Urgency and Volume overload (Likely from Flash Pulmonary Edema) so he was started on a NTG gtt and given IV Hydralazine. Patient also needed to be placed on BiPAP for his Acute Respiratory Failure and has now since weaned off of BiPAP. Nephrology was Consulted for maintence of ESRD he will be dialyzed today after discussion with Nephrologist Dr. Theador Hawthorne.   Assessment & Plan:   Principal Problem:   SOB (shortness of breath) Active Problems:   Type 2 diabetes mellitus with ESRD (end-stage renal disease) (HCC)   CRF (chronic renal failure)   Mixed hyperlipidemia   ESRD on dialysis [facility])   Atherosclerosis of native arteries of extremity with intermittent claudication (HCC)   Hypertensive urgency  Acute Respiratory Failure with Hypoxia in the setting of Volume Overload and Flash Pulmonary Edema from Hypertensive Urgency requiring NIPPV, improved -Patient weaned off of BiPAP and now off O2 via [place] saturating well on Room Air -Continuous Pulse Oximetry and Maintain O2 Saturations >92% -Was placed on IV NTG gtt on Admission and it is being continued -Nephrology Consulted for Maintenance of ESRD and for Hemodialysis  Flash Pulmonary Edema in the setting of Hypertensive Urgency and Volume Overload -C/w NTG gtt -C/w Lasix 40 mg po BID (patient still makes some urine) -Nephrology Consulted for Hemodialysis today  Hypertensive  Urgency/Accelerated Hypertension, improving -BP continues to be elevated -C/w NTG gtt -C/w Clonidine 0.2 mg TID, Doxsazosin 8 mg po Daily, Furosemide 40 mg po BID -When Necessary Add IV Hydralazine   ESRD on HD MWF -Patient's BUN/Cr went from 33/8.85 -> 43/10.90  -C/w Sevelamar Carbonate 1,600 po Daily and with Calcium Acetate 2,001 mg TIDwm and 667 po with Snacks -C/w Cinacalcet 30 mg po Daily -Nephrology Dr. 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 -Decrease Levemir 20 units sq to 15 units sq -C/w with Sensitive Novolog SSI AC; D/C'd HS component as patient was Hypoglycemic this AM -C/w Linagliptin 5 mg po Daily -CBG ranging from 41-133  -Check HbA1c; Last HbA1c was 5.1 in 09/08/16  -C/w Gabapentin 100 mg po BID -C/w Renal/Carb Modified Diet  Hyperlipidemia -C/w Simvastatin 20 mg po Daily  Hx of Prostate Cancer s/p Radiaton Treatment -C/w Doxazosin 8 mg po Daily  GERD -C/w Pantoprazole 40 mg po Daily  Anemia of Chronic Disease -Patient's Hb /Hct went from 11.3/35.2 on Admission to 10.5/31.5 -Patient getting Epo per Nephro -Repeat CBC in AM  Tobacco Abuse -Smoking Cessastion Counseling given  Mildly Elevated Troponin in the Setting of ESRD and Hypertensive Urgency -Troponin Level was 0.09 om 4/22 -Doubt ACS  Mild Leukocytosis, improved -Likely Reactive -Patient's WBC on Admission was 10.6 -> 9.6 -Repeat CMP in AM -Continue to Monitor for S/Sx of Infection   Constipation -C/w Linaclotide 145 mcg po Daily  before breakfast  DVT prophylaxis: Heparin 5,000 units sq q8h Code Status: FULL CODE Family Communication: No Family present present at bedside Disposition Plan: Home at D/C; Will get PT Evaluation after dialysis.  Consultants:   Nephrology Dr. Theador Hawthorne  Procedures:  Hemodialysis   Antimicrobials:  Anti-infectives    None     Subjective: Seen and examined at bedside and states SOB improved. Was tolerating breakfast fine. No nausea or vomiting. Complains of numbness in Right hand after attempted fistula. No CP or Lightheadedness.   Objective: Vitals:   01/24/17 0600 01/24/17 0630 01/24/17 0719 01/24/17 1123  BP: (!) 164/69 (!) 168/70    Pulse: 89 89 87 92  Resp: 18 16 (!) 21   Temp:   97.6 F (36.4 C) 98.1 F (36.7 C)  TempSrc:   Oral Oral  SpO2: 93% 94% 95% 97%  Weight:      Height:        Intake/Output Summary (Last 24 hours) at 01/24/17 1159 Last data filed at 01/24/17 1141  Gross per 24 hour  Intake          2323.07 ml  Output                0 ml  Net          2323.07 ml   Filed Weights   01/23/17 0208 01/23/17 0454 01/24/17 0400  Weight: 63.5 kg (140 lb) 63.5 kg (139 lb 15.9 oz) 64.1 kg (141 lb 5 oz)   Examination: Physical Exam:  Constitutional: NAD Cachectic 76 yo Male and appears calm and comfortable Eyes: Lids and conjunctivae normal, sclerae anicteric  ENMT: External Ears, Nose appear normal. Grossly normal hearing. Neck: Appears normal, supple, no cervical masses, normal ROM, no appreciable thyromegaly Chest Wall: Has right sided Perm-Cath Respiratory: Diminished to auscultation bilaterally, no wheezing, rales, rhonchi or crackles. Normal respiratory effort and patient is not tachypenic. No accessory muscle use. Not using any accessory muscles to breathe.  Cardiovascular: RRR, no murmurs / rubs / gallops. S1 and S2 auscultated. No extremity edema noted. Abdomen: Soft, non-tender, non-distended. No masses palpated. No appreciable hepatosplenomegaly. Bowel sounds positive x4.  GU: Deferred. Musculoskeletal: No clubbing / cyanosis of digits/nails. Right BKA Skin: No rashes, lesions, ulcers on limited skin eval. No induration; Warm and dry. Patient has scaling of left leg Neurologic: CN 2-12 grossly intact with no focal  deficits. Has some dysarthria. Sensation diminished in left arm and hand. Romberg sign cerebellar reflexes not assessed.  Psychiatric: Normal judgment and insight. Alert and oriented x 3. Normal mood and appropriate affect.   Data Reviewed: I have personally reviewed following labs and imaging studies  CBC:  Recent Labs Lab 01/18/17 0500 01/23/17 0231 01/23/17 0643 01/24/17 0827  WBC 14.3* 14.4* 10.6* 9.6  NEUTROABS 11.2* 12.0*  --  8.4*  HGB 10.6* 11.3* 10.8* 10.5*  HCT 32.7* 35.2* 33.2* 31.5*  MCV 109.0* 109.7* 108.9* 107.5*  PLT 258 259 217 433   Basic Metabolic Panel:  Recent Labs Lab 01/18/17 0500 01/19/17 0416 01/23/17 0231 01/23/17 0643 01/24/17 0827  NA 135 133* 136 132* 131*  K 4.0 4.1 4.7 5.3* 5.1  CL 94* 93* 95* 95* 93*  CO2 28 29 27 27 25   GLUCOSE 177* 148* 148* 135* 101*  BUN 24* 17 33* 35* 43*  CREATININE 7.87* 6.24* 8.85* 9.26* 10.90*  CALCIUM 8.5* 8.3* 8.7* 8.3* 8.7*  MG  --   --   --   --  2.2  PHOS  --   --   --   --  3.3   GFR: Estimated Creatinine Clearance: 5.1 mL/min (A) (by C-G formula based on SCr of 10.9 mg/dL (H)). Liver Function Tests:  Recent Labs Lab 01/18/17 0500 01/24/17 0827  AST 14* 13*  ALT 8* 6*  ALKPHOS 70 59  BILITOT 0.5 0.5  PROT 7.8 6.9  ALBUMIN 3.6 3.2*   No results for input(s): LIPASE, AMYLASE in the last 168 hours. No results for input(s): AMMONIA in the last 168 hours. Coagulation Profile: No results for input(s): INR, PROTIME in the last 168 hours. Cardiac Enzymes:  Recent Labs Lab 01/18/17 1318 01/18/17 1900 01/19/17 0038  TROPONINI 0.08* 0.08* 0.09*   BNP (last 3 results) No results for input(s): PROBNP in the last 8760 hours. HbA1C: No results for input(s): HGBA1C in the last 72 hours. CBG:  Recent Labs Lab 01/23/17 1626 01/23/17 2146 01/24/17 0718 01/24/17 0754 01/24/17 1113  GLUCAP 105* 163* 41* 98 133*   Lipid Profile: No results for input(s): CHOL, HDL, LDLCALC, TRIG, CHOLHDL,  LDLDIRECT in the last 72 hours. Thyroid Function Tests: No results for input(s): TSH, T4TOTAL, FREET4, T3FREE, THYROIDAB in the last 72 hours. Anemia Panel: No results for input(s): VITAMINB12, FOLATE, FERRITIN, TIBC, IRON, RETICCTPCT in the last 72 hours. Sepsis Labs: No results for input(s): PROCALCITON, LATICACIDVEN in the last 168 hours.  Recent Results (from the past 240 hour(s))  MRSA PCR Screening     Status: None   Collection Time: 01/18/17 12:27 PM  Result Value Ref Range Status   MRSA by PCR NEGATIVE NEGATIVE Final    Comment:        The GeneXpert MRSA Assay (FDA approved for NASAL specimens only), is one component of a comprehensive MRSA colonization surveillance program. It is not intended to diagnose MRSA infection nor to guide or monitor treatment for MRSA infections.     Radiology Studies: Dg Chest Port 1 View  Result Date: 01/23/2017 CLINICAL DATA:  76 year old male with shortness of breath. EXAM: PORTABLE CHEST 1 VIEW COMPARISON:  Chest radiograph dated 01/18/2017 FINDINGS: A right-sided dialysis catheter remains in stable position. Probable mild interval improvement of the vascular congestion. No focal consolidation, or pneumothorax. Probable trace right pleural effusion. Stable mild cardiomegaly. No acute osseous pathology. A right axillary vascular stent noted. IMPRESSION: Slight interval improvement of the vascular congestion compared to the prior radiograph. No focal consolidation. Electronically Signed   By: Anner Crete M.D.   On: 01/23/2017 03:10   Scheduled Meds: . buPROPion  150 mg Oral Daily  . calcium acetate  2,001 mg Oral TID WC  . calcium acetate  667 mg Oral With snacks  . cinacalcet  30 mg Oral Q breakfast  . cloNIDine  0.2 mg Oral TID  . doxazosin  8 mg Oral Daily  . epoetin (EPOGEN/PROCRIT) injection  2,000 Units Intravenous Once  . feeding supplement (NEPRO CARB STEADY)  237 mL Oral BID BM  . fluorometholone  1 drop Both Eyes QHS  .  furosemide  40 mg Oral BID  . gabapentin  100 mg Oral BID  . heparin  5,000 Units Subcutaneous Q8H  . insulin aspart  0-5 Units Subcutaneous QHS  . insulin aspart  0-9 Units Subcutaneous TID WC  . insulin detemir  20 Units Subcutaneous Q2200  . linaclotide  145 mcg Oral QAC breakfast  . linagliptin  5 mg Oral Daily  . pantoprazole  40 mg Oral Daily  . polyvinyl alcohol  1 drop Both Eyes BID  . psyllium  1 packet Oral Daily  .  QUEtiapine  200 mg Oral QHS  . sevelamer carbonate  1,600 mg Oral TID WC  . simvastatin  20 mg Oral Daily   Continuous Infusions: . nitroGLYCERIN 50 mcg/min (01/24/17 0954)    LOS: 1 day   Kerney Elbe, DO [facility] Pager [phone]  If 7PM-7AM, please contact night-coverage [email or web address] Password [email or web address] 01/24/2017, 11:59 AM

## 2017-01-24 NOTE — Consult Note (Signed)
Gerald Davenport MRN: [id number] DOB/AGE: Feb 03, 1941 76 y.o. Primary Care Physician:BEFEKADU,BELAYENH S, MD Admit date: 01/23/2017 Chief Complaint:  Chief Complaint  Patient presents with  . Shortness of Breath   HPI: [name] a 76 year old malewith past medical hx of  ESRD, HTN and DM who came in yesterday  With dyspnea.  HPI dates back to past 2-3 days when pt started feeling short of bretha, it ws progressive and pt came to ER.Pt  has not missed any HD sessions . Pt was recently admitted into the hospital and discharge a few days ago.   Upon Evaluation in the ER showed severe HTN with Systolic BP 833/825. Pt was admitted and  was started on Bipap and IV NTG along with IV Hydralazine.  Pt currently in ICU, off Bipap.  Pt offers  No fever/ chills/ productive coughs.  Pt offers no chest pain. Pt offers no c/o frequency/ dysuria/urgency. Pt main concern was " Am I going home after Dialysis?"  Past Medical History:  Diagnosis Date  . Anemia   . Anxiety   . Cancer [facility])    Prostate cancer treated with radiation therapy  . Chronic kidney disease    dialysis Mon, Wednes, Fri  . Clot 05/2006   Arterial clot right leg   . Constipation   . Diabetes mellitus    Type II  . Diverticulosis   . GERD (gastroesophageal reflux disease)   . Hemodialysis patient ([place]) 6/13  . Hyperlipidemia   . Hypertension   . Infection    drainage from distal incision of left upper arm arteriovenous graft  . Positive PPD   . Shortness of breath dyspnea    with exertion, when he has too much fluid  . Steal syndrome dialysis vascular access ([place])   . Thyroid disease    hyperparathyroidism  . Wears glasses         Family History  Problem Relation Age of Onset  . Heart disease Mother   . Diabetes Mother   . Hypertension Mother   . Stroke Mother   . Heart disease Father   . Diabetes Father   . Heart disease Sister   . Diabetes Brother   . Colon cancer Neg Hx     Social History:  reports  that he has been smoking Cigarettes.  He has a 12.50 pack-year smoking history. He has never used smokeless tobacco. He reports that he does not drink alcohol or use drugs.   Allergies: No Known Allergies  Medications Prior to Admission  Medication Sig Dispense Refill  . buPROPion (WELLBUTRIN XL) 150 MG 24 hr tablet Take 150 mg by mouth daily.    . cloNIDine (CATAPRES) 0.2 MG tablet Take 1 tablet (0.2 mg total) by mouth 3 (three) times daily. 42 tablet 0  . doxazosin (CARDURA) 8 MG tablet Take 8 mg by mouth daily. Reported on 02/26/2016    . furosemide (LASIX) 40 MG tablet Take 40 mg by mouth 2 (two) times daily.     . Insulin Detemir (LEVEMIR FLEXTOUCH) 100 UNIT/ML Pen Inject 20 Units into the skin daily at 10 pm.    . omeprazole (PRILOSEC) 20 MG capsule Take 20 mg by mouth daily.     . QUEtiapine (SEROQUEL) 100 MG tablet Take 200 mg by mouth at bedtime.     . saxagliptin HCl (ONGLYZA) 5 MG TABS tablet Take 5 mg by mouth daily with breakfast.     . simvastatin (ZOCOR) 20 MG tablet Take 20  mg by mouth daily.    Marland Kitchen acetaminophen-codeine (TYLENOL #3) 300-30 MG tablet Take 1 tablet by mouth every 4 (four) hours as needed for moderate pain. 10 tablet 0  . calcium acetate (PHOSLO) 667 MG capsule Take 667-2,001 mg by mouth See admin instructions. Take 2001 mg by mouth 3 times daily. Take 667 mg by mouth with snacks.    . fluorometholone (FML) 0.1 % ophthalmic suspension Place 1 drop into both eyes at bedtime.    . gabapentin (NEURONTIN) 100 MG capsule Take 100 mg by mouth 2 (two) times daily.    . Linaclotide (LINZESS) 145 MCG CAPS capsule Take 1 capsule (145 mcg total) by mouth daily. Take 30 minutes before breakfast 30 capsule 11  . oxyCODONE (OXY IR/ROXICODONE) 5 MG immediate release tablet Take 5 mg by mouth every 4 (four) hours as needed for severe pain.    . Polyvinyl Alcohol-Povidone (REFRESH OP) Place 1 drop into both eyes at bedtime.    . psyllium (METAMUCIL) 58.6 % powder Take 1 packet by  mouth as needed (for stool).     . SENSIPAR 30 MG tablet Take 30 mg by mouth daily.     . sevelamer carbonate (RENVELA) 800 MG tablet Take 800-1,600 mg by mouth 3 (three) times daily with meals.         HBZ:JIRCV from the symptoms mentioned above,there are no other symptoms referable to all systems reviewed.  Marland Kitchen buPROPion  150 mg Oral Daily  . calcium acetate  2,001 mg Oral TID WC  . calcium acetate  667 mg Oral With snacks  . cinacalcet  30 mg Oral Q breakfast  . cloNIDine  0.2 mg Oral TID  . doxazosin  8 mg Oral Daily  . feeding supplement (NEPRO CARB STEADY)  237 mL Oral BID BM  . fluorometholone  1 drop Both Eyes QHS  . furosemide  40 mg Oral BID  . gabapentin  100 mg Oral BID  . heparin  5,000 Units Subcutaneous Q8H  . insulin aspart  0-5 Units Subcutaneous QHS  . insulin aspart  0-9 Units Subcutaneous TID WC  . insulin detemir  20 Units Subcutaneous Q2200  . linaclotide  145 mcg Oral QAC breakfast  . linagliptin  5 mg Oral Daily  . pantoprazole  40 mg Oral Daily  . polyvinyl alcohol  1 drop Both Eyes BID  . psyllium  1 packet Oral Daily  . QUEtiapine  200 mg Oral QHS  . sevelamer carbonate  1,600 mg Oral TID WC  . simvastatin  20 mg Oral Daily     Physical Exam: Vital signs in last 24 hours: Temp:  [97.6 F (36.4 C)-98.8 F (37.1 C)] 97.6 F (36.4 C) (04/28 0719) Pulse Rate:  [87-100] 87 (04/28 0719) Resp:  [0-28] 21 (04/28 0719) BP: (161-197)/(65-99) 168/70 (04/28 0630) SpO2:  [88 %-98 %] 95 % (04/28 0719) Weight:  [141 lb 5 oz (64.1 kg)] 141 lb 5 oz (64.1 kg) (04/28 0400) Weight change: 1 lb 5 oz (0.596 kg) Last BM Date: 01/23/17  Intake/Output from previous day: 04/27 0701 - 04/28 0700 In: 1036.1 [P.O.:440; I.V.:359.1; NG/GT:237] Out: -  Total I/O In: 240 [P.O.:240] Out: -    Physical Exam: General- pt is awake,alert, follows commands. Resp- No acute REsp distress,  NO Rhonchi CVS- S1S2 regular in rate and rhythm GIT- BS+, soft, NT, ND EXT- NO LE  Edema, Cyanosis CNS- CN 2-12 grossly intact. Moving all 4 extremities Psych- normal mood and affect Access- PC  Lab Results: CBC  Recent Labs  01/23/17 0643 01/24/17 0827  WBC 10.6* 9.6  HGB 10.8* 10.5*  HCT 33.2* 31.5*  PLT 217 202    BMET  Recent Labs  01/23/17 0643 01/24/17 0827  NA 132* 131*  K 5.3* 5.1  CL 95* 93*  CO2 27 25  GLUCOSE 135* 101*  BUN 35* 43*  CREATININE 9.26* 10.90*  CALCIUM 8.3* 8.7*    MICRO Recent Results (from the past 240 hour(s))  MRSA PCR Screening     Status: None   Collection Time: 01/18/17 12:27 PM  Result Value Ref Range Status   MRSA by PCR NEGATIVE NEGATIVE Final    Comment:        The GeneXpert MRSA Assay (FDA approved for NASAL specimens only), is one component of a comprehensive MRSA colonization surveillance program. It is not intended to diagnose MRSA infection nor to guide or monitor treatment for MRSA infections.       Lab Results  Component Value Date   CALCIUM 8.7 (L) 01/24/2017   CAION 1.14 (L) 05/29/2016   PHOS 3.3 01/24/2017      Impression: 1)Renal  ESRD on HD                Pt is on MWF schedule.                Will dialyze today.                  2)HTN  Medication- On Diuretics On Vasodilators On Central Acting Sympatholytics   3)Anemia In ESRD the goal for HGb is 9--11. Pt Hgb is at goal Will eep on epo  4)CKD Mineral-Bone Disorder  Secondary Hyperparathyroidism present    On sensipar Phosphorus  at goal.   On binders  5)Resp- admitted  With resp distress  PMD following  6)Electrolytes  Normokalemic    Though on higher side  Hyponatremic    Sec to ESRD  7)Acid base Co2 at goal     Plan:   Will dilayze today Will use 2 k bath Will try to take 3 liters off Will keep on low dose epo  Addendum Pt seen on HD.   Jhade Berko S 01/24/2017, 9:51 AM

## 2017-01-24 NOTE — Procedures (Signed)
   HEMODIALYSIS TREATMENT NOTE:   4 hour low-heparin dialysis completed via right IJ tunneled PC. Exit site unremarkable. Goal nearly met: 2.8 liters removed.  Ultrafiltration rate was decreased twice in response to declining BP.  All blood was returned.  Report given to Terence Lux, RN.  Rockwell Alexandria, RN, CDN

## 2017-01-25 LAB — CBC WITH DIFFERENTIAL/PLATELET
Basophils Absolute: 0 10*3/uL (ref 0.0–0.1)
Basophils Relative: 0 %
EOS ABS: 0.2 10*3/uL (ref 0.0–0.7)
Eosinophils Relative: 3 %
HEMATOCRIT: 31.2 % — AB (ref 39.0–52.0)
Hemoglobin: 10.1 g/dL — ABNORMAL LOW (ref 13.0–17.0)
LYMPHS ABS: 1 10*3/uL (ref 0.7–4.0)
LYMPHS PCT: 15 %
MCH: 34.9 pg — AB (ref 26.0–34.0)
MCHC: 32.4 g/dL (ref 30.0–36.0)
MCV: 108 fL — AB (ref 78.0–100.0)
MONOS PCT: 11 %
Monocytes Absolute: 0.8 10*3/uL (ref 0.1–1.0)
NEUTROS PCT: 71 %
Neutro Abs: 4.7 10*3/uL (ref 1.7–7.7)
Platelets: 190 10*3/uL (ref 150–400)
RBC: 2.89 MIL/uL — ABNORMAL LOW (ref 4.22–5.81)
RDW: 17.8 % — ABNORMAL HIGH (ref 11.5–15.5)
WBC: 6.6 10*3/uL (ref 4.0–10.5)

## 2017-01-25 LAB — COMPREHENSIVE METABOLIC PANEL
ALK PHOS: 56 U/L (ref 38–126)
ALT: 6 U/L — ABNORMAL LOW (ref 17–63)
ANION GAP: 8 (ref 5–15)
AST: 11 U/L — ABNORMAL LOW (ref 15–41)
Albumin: 3 g/dL — ABNORMAL LOW (ref 3.5–5.0)
BILIRUBIN TOTAL: 0.5 mg/dL (ref 0.3–1.2)
BUN: 20 mg/dL (ref 6–20)
CHLORIDE: 96 mmol/L — AB (ref 101–111)
CO2: 29 mmol/L (ref 22–32)
Calcium: 8.6 mg/dL — ABNORMAL LOW (ref 8.9–10.3)
Creatinine, Ser: 6.76 mg/dL — ABNORMAL HIGH (ref 0.61–1.24)
GFR, EST AFRICAN AMERICAN: 8 mL/min — AB (ref >60)
GFR, EST NON AFRICAN AMERICAN: 7 mL/min — AB (ref >60)
Glucose, Bld: 104 mg/dL — ABNORMAL HIGH (ref 65–99)
Potassium: 4.2 mmol/L (ref 3.5–5.1)
Sodium: 133 mmol/L — ABNORMAL LOW (ref 135–145)
TOTAL PROTEIN: 6.6 g/dL (ref 6.5–8.1)

## 2017-01-25 LAB — GLUCOSE, CAPILLARY
GLUCOSE-CAPILLARY: 77 mg/dL (ref 65–99)
GLUCOSE-CAPILLARY: 99 mg/dL (ref 65–99)

## 2017-01-25 LAB — PHOSPHORUS: Phosphorus: 2.8 mg/dL (ref 2.5–4.6)

## 2017-01-25 LAB — MAGNESIUM: MAGNESIUM: 2 mg/dL (ref 1.7–2.4)

## 2017-01-25 MED ORDER — NEPRO/CARBSTEADY PO LIQD: 237.0000 mL | Freq: Two times a day (BID) | ORAL | 0 refills | Status: DC

## 2017-01-25 NOTE — Progress Notes (Signed)
Patient alert and oriented. Vital signs are stable. Denies pain. Discharge instructions given. Patient spouse verbalized understanding of instructions. Patient left floor via wheelchair with nursing staff.

## 2017-01-25 NOTE — Discharge Summary (Signed)
Physician Discharge Summary  Gerald Davenport:[id number] DOB: 29-Jun-1941 DOA: 01/23/2017  PCP: Harriett Sine, MD  Admit date: 01/23/2017 Discharge date: 01/25/2017  Admitted From: Home Disposition:  Home  Recommendations for Outpatient Follow-up:  1. Follow up with PCP Dr. Donnie Coffin in 1-2 weeks 2. Follow up with Nephrologist Dr. Lowanda Foster in 1 week and continue with regular dialysis treatments 3. Avoid  4. Please obtain CMP/CBC in one week  Home Health: No Equipment/Devices: None  Discharge Condition: Stable CODE STATUS: FULL CODE Diet recommendation: Heart Healthy / Renal/Carb Modified   Brief/Interim Summary: Patient is a 76 yo AAM with a PMH of ESRD on HD MWF, HTN, DM, Hx of Prostate Cancer s/p treatment with Radiation Therapy, GERD, HLD, Anemia of Chronic Disease, Hyperparathyroidism and other comorbid conditions who presented to [facility] with a CC of SOB. Patient was recently discharged on 4/23 and presented again with SOB. States he has not missed any HD sessions but question compliance with Diet. Patientwas found to be in Hypertensive Urgency and Volume overload (Likely from Flash Pulmonary Edema) so he was started on a NTG gtt and given IV Hydralazine. Patient also needed to be placed on BiPAP for his Acute Respiratory Failure and has now since weaned off of BiPAP. Nephrology was Consulted for maintence of ESRD and he was dialyzed yesterday. Patient improved significantly and had no complaints this AM. Patient deemed medically stable for D/C at this point and will need to follow up with PCP and with Nephrology as an outpatient. Advised strict dietary compliance.    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)   CRF (chronic renal failure)   GERD (gastroesophageal reflux disease)   Constipation   Mixed hyperlipidemia   Diabetic neuropathy associated with  type 2 diabetes mellitus (HCC)   ESRD on dialysis [facility])   Atherosclerosis of native arteries of extremity with intermittent claudication (HCC)   Hypertensive urgency   Acute respiratory failure (HCC)  Acute Respiratory Failure with Hypoxia in the setting of Volume Overload and Flash Pulmonary Edema from Hypertensive Urgency requiring NIPPV, improved -Patient weaned off of BiPAP and now off O2 via [place] saturating well on Room Air -Continuous Pulse Oximetry and Maintain O2 Saturations >92% while in the hospital  -Was placed on IV NTG gtt on Admission and it is being continued and now weaned off as patient is ready for D/C -Nephrology Consulted for Maintenance of ESRD and for Hemodialysis  Flash Pulmonary Edema in the setting of Hypertensive Urgency and Volume Overload, improved after Hemodialysis -NTG gtt weaned off -C/w Lasix 40 mg po BID (patient still makes some urine) -Nephrology Consulted for Hemodialysis yesterday -Advised Low Salt Low Fluid Renal Carb Modified Diet to try to prevent Volume Overload -Follow up with Nephrology as an outpatient and continue regularly scheduled Hemodialysis sessions   Hypertensive Urgency/Accelerated Hypertension, improved -BP improved after dialysis -NTG gtt weaned off -C/w Clonidine 0.2 mg TID, Doxsazosin 8 mg po Daily, Furosemide 40 mg po BID -Follow up with PCP and Nephrology as an outopatient   ESRD on HD MWF -Patient's BUN/Cr went from 33/8.85 -> 43/10.90 -> 20/6.76 -C/w Sevelamar Carbonate 1,600 po Daily and with Calcium Acetate 2,001 mg TIDwm and 667 po with Snacks -C/w Cinacalcet 30 mg po Daily -Nephrology Dr. Theador Hawthorne Conuslted and Appericated Rec's -Patient to underwent HD yesterday  -Patient got IV Epoetin Alfa 2,000 Units IV for Anemia due  to Renal Failure yesterday -Follow up with Normal Dialysis Sessions -Repeat CMP as an outpatient    Insulin Dependent Diabetes Mellitus Type 2 complicated by Neuropathy -C/w Home Insulin -C/w  Linagliptin 5 mg po Daily -CBG ranging from 56-133  -Check HbA1c; Last HbA1c was 5.1 in 09/08/16  -C/w Gabapentin 100 mg po BID -C/w Renal/Carb Modified Diet  Hyperlipidemia -C/w Simvastatin 20 mg po Daily  Hx of Prostate Cancer s/p Radiaton Treatment -C/w Doxazosin 8 mg po Daily  GERD -C/w Pantoprazole 40 mg po Daily  Anemia of Chronic Disease -Patient's Hb /Hct went from 11.3/35.2 on Admission to 10.5/31.5 -> 10.1/31.2 -Patient getting Epo per Nephro -Repeat CBC as an outopatient   Tobacco Abuse -Smoking Cessastion Counseling given  Mildly Elevated Troponin in the Setting of ESRD and Hypertensive Urgency -Troponin Level was 0.09 om 4/22 -Doubt ACS -Follow up as an outpatient  Mild Leukocytosis, improved -Likely Reactive to Volume Overload -Patient's WBC on Admission was 10.6 -> 9.6 -> 6.6 -Repeat CMP as an outpatient  Constipation -C/w Home Linaclotide 145 mcg po Daily before breakfast  Discharge Instructions  Discharge Instructions    Call MD for:  difficulty breathing, headache or visual disturbances    Complete by:  As directed    Call MD for:  extreme fatigue    Complete by:  As directed    Call MD for:  persistant dizziness or light-headedness    Complete by:  As directed    Call MD for:  persistant nausea and vomiting    Complete by:  As directed    Call MD for:  severe uncontrolled pain    Complete by:  As directed    Call MD for:  temperature >100.4    Complete by:  As directed    Diet - low sodium heart healthy    Complete by:  As directed    Discharge instructions    Complete by:  As directed    Follow up with PCP at discharge within 1 week. Please go to regularly scheduled Dialysis appointments MWF. Take all medications as prescribed. If symptoms change or worsen please return to the ED for evaluation.   Increase activity slowly    Complete by:  As directed      Allergies as of 01/25/2017   No Known Allergies     Medication List     TAKE these medications   buPROPion 150 MG 24 hr tablet Commonly known as:  WELLBUTRIN XL Take 150 mg by mouth daily.   calcium acetate 667 MG capsule Commonly known as:  PHOSLO Take 667-2,001 mg by mouth daily. Pt takes three capsules three times daily with meals and one capsule two times daily with snacks.   carboxymethylcellulose 0.5 % Soln Commonly known as:  REFRESH PLUS Place 1 drop into both eyes at bedtime.   cinacalcet 30 MG tablet Commonly known as:  SENSIPAR Take 30 mg by mouth daily.   cloNIDine 0.2 MG tablet Commonly known as:  CATAPRES Take 1 tablet (0.2 mg total) by mouth 3 (three) times daily.   doxazosin 8 MG tablet Commonly known as:  CARDURA Take 8 mg by mouth daily.   feeding supplement (NEPRO CARB STEADY) Liqd Take 237 mLs by mouth 2 (two) times daily between meals.   fluorometholone 0.1 % ophthalmic suspension Commonly known as:  FML Place 1 drop into both eyes at bedtime.   furosemide 40 MG tablet Commonly known as:  LASIX Take 40 mg by mouth 2 (two) times  daily.   gabapentin 100 MG capsule Commonly known as:  NEURONTIN Take 100 mg by mouth 2 (two) times daily.   LEVEMIR FLEXTOUCH 100 UNIT/ML Pen Generic drug:  Insulin Detemir Inject 20 Units into the skin at bedtime.   linaclotide 145 MCG Caps capsule Commonly known as:  LINZESS Take 1 capsule (145 mcg total) by mouth daily. Take 30 minutes before breakfast   omeprazole 20 MG capsule Commonly known as:  PRILOSEC Take 20 mg by mouth daily.   ONGLYZA 5 MG Tabs tablet Generic drug:  saxagliptin HCl Take 5 mg by mouth daily.   oxyCODONE 5 MG immediate release tablet Commonly known as:  Oxy IR/ROXICODONE Take 5 mg by mouth every 6 (six) hours as needed for severe pain.   psyllium 58.6 % powder Commonly known as:  METAMUCIL Take 1 packet by mouth 3 (three) times daily as needed (for constipation).   QUEtiapine 100 MG tablet Commonly known as:  SEROQUEL Take 200 mg by mouth at  bedtime.   sevelamer carbonate 800 MG tablet Commonly known as:  RENVELA Take 1,600 mg by mouth 3 (three) times daily with meals.   simvastatin 20 MG tablet Commonly known as:  ZOCOR Take 20 mg by mouth at bedtime.      Follow-up Information    [facility] S, MD. Call in 1 week(s).   Specialty:  Nephrology Why:  Follow up for Regularly scheduled Dialysis MWF Contact information: [name] [address] [phone]        Donnie Coffin, MD. Call in 1 week(s).   Specialty:  Family Medicine Why:  Call to schedule hospital follow up for volume overload. Contact information: [address] [place] [address] [phone]          No Known Allergies  Consultations:  Nephrology  Procedures/Studies: Dg Chest Port 1 View  Result Date: 01/23/2017 CLINICAL DATA:  76 year old male with shortness of breath. EXAM: PORTABLE CHEST 1 VIEW COMPARISON:  Chest radiograph dated 01/18/2017 FINDINGS: A right-sided dialysis catheter remains in stable position. Probable mild interval improvement of the vascular congestion. No focal consolidation, or pneumothorax. Probable trace right pleural effusion. Stable mild cardiomegaly. No acute osseous pathology. A right axillary vascular stent noted. IMPRESSION: Slight interval improvement of the vascular congestion compared to the prior radiograph. No focal consolidation. Electronically Signed   By: Anner Crete M.D.   On: 01/23/2017 03:10   Dg Chest Port 1 View  Result Date: 01/18/2017 CLINICAL DATA:  Acute onset of shortness of breath. Initial encounter. EXAM: PORTABLE CHEST 1 VIEW COMPARISON:  Chest radiograph from 09/08/2016 FINDINGS: The lungs are well-aerated. Vascular congestion is noted. Increased interstitial markings raise concern for pulmonary edema. Trace right-sided pleural fluid is noted. There is no evidence of pneumothorax. The cardiomediastinal silhouette is borderline enlarged. A  right-sided dual-lumen catheter is noted ending about the distal SVC. No acute osseous abnormalities are seen. IMPRESSION: Vascular congestion and borderline cardiomegaly. Increased interstitial markings raise concern for pulmonary edema. Trace right-sided pleural fluid noted. Electronically Signed   By: Garald Balding M.D.   On: 01/18/2017 05:45    Subjective: Seen and examined at bedside and was doing better and had no SOB or CP. States he is back to his baseline and wants to go home. No other concerns or complaints at this time.   Discharge Exam: Vitals:   01/25/17 0800 01/25/17 0900  BP: 133/62 (!) 162/79  Pulse: 83 86  Resp: (!) 21 19  Temp:  Vitals:   01/25/17 0700 01/25/17 0717 01/25/17 0800 01/25/17 0900  BP: (!) 132/58 132/82 133/62 (!) 162/79  Pulse: 81 82 83 86  Resp: 15 20 (!) 21 19  Temp:  98.7 F (37.1 C)    TempSrc:  Oral    SpO2: 97% 99% 99% 100%  Weight:      Height:       General: Pt is alert, awake, not in acute distress Cardiovascular: RRR, S1/S2 +, no rubs, no gallops Chest Wall: Has Perm-A-Cath on Right Side Respiratory: CTA bilaterally, no wheezing, no rhonchi Abdominal: Soft, NT, ND, bowel sounds + Extremities: Right BKA; Has scars from where Right Arm Fistula failed  The results of significant diagnostics from this hospitalization (including imaging, microbiology, ancillary and laboratory) are listed below for reference.    Microbiology: Recent Results (from the past 240 hour(s))  MRSA PCR Screening     Status: None   Collection Time: 01/18/17 12:27 PM  Result Value Ref Range Status   MRSA by PCR NEGATIVE NEGATIVE Final    Comment:        The GeneXpert MRSA Assay (FDA approved for NASAL specimens only), is one component of a comprehensive MRSA colonization surveillance program. It is not intended to diagnose MRSA infection nor to guide or monitor treatment for MRSA infections.     Labs: BNP (last 3 results) No results for input(s): BNP  in the last 8760 hours. Basic Metabolic Panel:  Recent Labs Lab 01/19/17 0416 01/23/17 0231 01/23/17 0643 01/24/17 0827 01/25/17 0441  NA 133* 136 132* 131* 133*  K 4.1 4.7 5.3* 5.1 4.2  CL 93* 95* 95* 93* 96*  CO2 29 27 27 25 29   GLUCOSE 148* 148* 135* 101* 104*  BUN 17 33* 35* 43* 20  CREATININE 6.24* 8.85* 9.26* 10.90* 6.76*  CALCIUM 8.3* 8.7* 8.3* 8.7* 8.6*  MG  --   --   --  2.2 2.0  PHOS  --   --   --  3.3 2.8   Liver Function Tests:  Recent Labs Lab 01/24/17 0827 01/25/17 0441  AST 13* 11*  ALT 6* 6*  ALKPHOS 59 56  BILITOT 0.5 0.5  PROT 6.9 6.6  ALBUMIN 3.2* 3.0*   No results for input(s): LIPASE, AMYLASE in the last 168 hours. No results for input(s): AMMONIA in the last 168 hours. CBC:  Recent Labs Lab 01/23/17 0231 01/23/17 0643 01/24/17 0827 01/25/17 0441  WBC 14.4* 10.6* 9.6 6.6  NEUTROABS 12.0*  --  8.4* 4.7  HGB 11.3* 10.8* 10.5* 10.1*  HCT 35.2* 33.2* 31.5* 31.2*  MCV 109.7* 108.9* 107.5* 108.0*  PLT 259 217 202 190   Cardiac Enzymes:  Recent Labs Lab 01/18/17 1318 01/18/17 1900 01/19/17 0038  TROPONINI 0.08* 0.08* 0.09*   BNP: Invalid input(s): POCBNP CBG:  Recent Labs Lab 01/24/17 1601 01/24/17 2107 01/24/17 2131 01/25/17 0152 01/25/17 0716  GLUCAP 97 56* 133* 77 99   D-Dimer No results for input(s): DDIMER in the last 72 hours. Hgb A1c No results for input(s): HGBA1C in the last 72 hours. Lipid Profile No results for input(s): CHOL, HDL, LDLCALC, TRIG, CHOLHDL, LDLDIRECT in the last 72 hours. Thyroid function studies No results for input(s): TSH, T4TOTAL, T3FREE, THYROIDAB in the last 72 hours.  Invalid input(s): FREET3 Anemia work up No results for input(s): VITAMINB12, FOLATE, FERRITIN, TIBC, IRON, RETICCTPCT in the last 72 hours. Urinalysis No results found for: COLORURINE, APPEARANCEUR, [place], [place], [place], [place], [place], [place], [place], [place], NITRITE, LEUKOCYTESUR  Sepsis  Labs Invalid input(s): PROCALCITONIN,  WBC,  LACTICIDVEN Microbiology Recent Results (from the past 240 hour(s))  MRSA PCR Screening     Status: None   Collection Time: 01/18/17 12:27 PM  Result Value Ref Range Status   MRSA by PCR NEGATIVE NEGATIVE Final    Comment:        The GeneXpert MRSA Assay (FDA approved for NASAL specimens only), is one component of a comprehensive MRSA colonization surveillance program. It is not intended to diagnose MRSA infection nor to guide or monitor treatment for MRSA infections.    Time coordinating discharge: 35 minutes  SIGNED:  Kerney Elbe, DO [facility] 01/25/2017, 11:44 AM Pager [phone]  If 7PM-7AM, please contact night-coverage [email or web address] Password [email or web address]

## 2017-01-25 NOTE — Progress Notes (Signed)
Gerald Davenport  MRN: [id number]  DOB/AGE: 78-17-1942 78 y.o.  Primary Care Physician:Gerald S, MD  Admit date: 01/23/2017  Chief Complaint:  Chief Complaint  Patient presents with  . Shortness of Breath    Davenport-Pt presented on  01/23/2017 with  Chief Complaint  Patient presents with  . Shortness of Breath  .    Pt today feels better  Meds . buPROPion  150 mg Oral Daily  . calcium acetate  2,001 mg Oral TID WC  . calcium acetate  667 mg Oral With snacks  . cinacalcet  30 mg Oral Q breakfast  . cloNIDine  0.2 mg Oral TID  . doxazosin  8 mg Oral Daily  . feeding supplement (NEPRO CARB STEADY)  237 mL Oral BID BM  . fluorometholone  1 drop Both Eyes QHS  . furosemide  40 mg Oral BID  . gabapentin  100 mg Oral BID  . heparin  5,000 Units Subcutaneous Q8H  . insulin aspart  0-9 Units Subcutaneous TID WC  . insulin detemir  20 Units Subcutaneous Q2200  . linaclotide  145 mcg Oral QAC breakfast  . linagliptin  5 mg Oral Daily  . pantoprazole  40 mg Oral Daily  . polyvinyl alcohol  1 drop Both Eyes BID  . psyllium  1 packet Oral Daily  . QUEtiapine  200 mg Oral QHS  . sevelamer carbonate  1,600 mg Oral TID WC  . simvastatin  20 mg Oral Daily        Physical Exam: Vital signs in last 24 hours: Temp:  [98 F (36.7 C)-99.7 F (37.6 C)] 98.7 F (37.1 C) (04/29 0717) Pulse Rate:  [81-101] 82 (04/29 0717) Resp:  [7-34] 20 (04/29 0717) BP: (125-211)/(53-88) 132/82 (04/29 0717) SpO2:  [93 %-100 %] 99 % (04/29 0717) Weight:  [137 lb 12.6 oz (62.5 kg)-141 lb 8.6 oz (64.2 kg)] 137 lb 12.6 oz (62.5 kg) (04/29 0500) Weight change: 3.5 oz (0.1 kg) Last BM Date: 01/24/17  Intake/Output from previous day: 04/28 0701 - 04/29 0700 In: 2107 [P.O.:1287; I.V.:820] Out: 2800  No intake/output data recorded.   Physical Exam: General- pt is awake,alert, follows commands. Resp- No acute REsp distress,  NO Rhonchi CVS- S1S2 regular in rate and rhythm GIT- BS+, soft, NT,  ND EXT- NO LE Edema, Cyanosis Access- PC    Lab Results: CBC  Recent Labs  01/24/17 0827 01/25/17 0441  WBC 9.6 6.6  HGB 10.5* 10.1*  HCT 31.5* 31.2*  PLT 202 190    BMET  Recent Labs  01/24/17 0827 01/25/17 0441  NA 131* 133*  K 5.1 4.2  CL 93* 96*  CO2 25 29  GLUCOSE 101* 104*  BUN 43* 20  CREATININE 10.90* 6.76*  CALCIUM 8.7* 8.6*    MICRO Recent Results (from the past 240 hour(Davenport))  MRSA PCR Screening     Status: None   Collection Time: 01/18/17 12:27 PM  Result Value Ref Range Status   MRSA by PCR NEGATIVE NEGATIVE Final    Comment:        The GeneXpert MRSA Assay (FDA approved for NASAL specimens only), is one component of a comprehensive MRSA colonization surveillance program. It is not intended to diagnose MRSA infection nor to guide or monitor treatment for MRSA infections.       Lab Results  Component Value Date   CALCIUM 8.6 (L) 01/25/2017   CAION 1.14 (L) 05/29/2016   PHOS 2.8 01/25/2017  Impression: 1)Renal  ESRD on HD                Pt is on MWF schedule as an outpt.                Ptwas last dialyzed yesterday                NO need of HD today                 2)HTN  Medication- On Diuretics On Vasodilators On Central Acting Sympatholytics   3)Anemia In ESRD the goal for HGb is 9--11. Pt Hgb is at goal Will eep on epo  4)CKD Mineral-Bone Disorder  Secondary Hyperparathyroidism present    On sensipar Phosphorus  at goal.   On binders  5)Resp- admitted  With resp distress  PMD following  6)Electrolytes  Normokalemic   Hyponatremic    Sec to ESRD     better  7)Acid base Co2 at goal    Plan:   NO need of HD today Will continue current care   Varetta Chavers Davenport 01/25/2017, 8:54 AM

## 2017-01-29 ENCOUNTER — Encounter: Payer: Self-pay | Admitting: Vascular Surgery

## 2017-02-04 ENCOUNTER — Ambulatory Visit (HOSPITAL_COMMUNITY)
Admission: RE | Admit: 2017-02-04 | Discharge: 2017-02-04 | Disposition: A | Discharge status: Home / Self Care | Payer: Medicare Other | Source: Ambulatory Visit | Attending: Vascular Surgery | Admitting: Vascular Surgery

## 2017-02-04 DIAGNOSIS — I10 Essential (primary) hypertension: Secondary | ICD-10-CM | POA: Insufficient documentation

## 2017-02-04 DIAGNOSIS — Z95820 Peripheral vascular angioplasty status with implants and grafts: Secondary | ICD-10-CM | POA: Diagnosis not present

## 2017-02-04 DIAGNOSIS — E1151 Type 2 diabetes mellitus with diabetic peripheral angiopathy without gangrene: Secondary | ICD-10-CM | POA: Diagnosis not present

## 2017-02-04 DIAGNOSIS — I739 Peripheral vascular disease, unspecified: Secondary | ICD-10-CM

## 2017-02-05 ENCOUNTER — Ambulatory Visit: Payer: Medicare Other | Admitting: Nurse Practitioner

## 2017-02-05 ENCOUNTER — Encounter: Payer: Self-pay | Admitting: Nurse Practitioner

## 2017-02-05 ENCOUNTER — Telehealth: Payer: Self-pay | Admitting: Nurse Practitioner

## 2017-02-05 NOTE — Progress Notes (Deleted)
Referring Provider: Alroy Dust, L.Marlou Sa, MD Primary Care Physician:  Fran Lowes, MD Primary GI:  Dr.   Rayne Du chief complaint on file.   HPI:   Gerald Davenport is a 76 y.o. male who presents for anemia and history of colon polyps. The patient was last seen in our office 05/10/2015 for constipation. 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 Colonoscopy had been completed in 2015 and next due for 5 year follow-up in 2020. At his last visit he was having a bowel movement every other day, still on Linzess except for dialysis days and stools range from [place] 3-5. Occasional straining noted at that time. Recent infected stent removal in his right upper extremity with associated wound VAC. Otherwise asymptomatic from a GI standpoint. Recommended continue current medications, start Colace once a day, return for follow-up in 3 months. Follow-up exam did not happen.  Today he states   Past Medical History:  Diagnosis Date  . Anemia   . Anxiety   . Cancer [facility])    Prostate cancer treated with radiation therapy  . Chronic kidney disease    dialysis Mon, Wednes, Fri  . Clot 05/2006   Arterial clot right leg   . Constipation   . Diabetes mellitus    Type II  . Diverticulosis   . GERD (gastroesophageal reflux disease)   . Hemodialysis patient ([place]) 6/13  . Hyperlipidemia   . Hypertension   . Infection    drainage from distal incision of left upper arm arteriovenous graft  . Positive PPD   . Shortness of breath dyspnea    with exertion, when he has too much fluid  . Steal syndrome dialysis vascular access ([place])   . Thyroid disease    hyperparathyroidism  . Wears glasses     Past Surgical History:  Procedure Laterality Date  . AV FISTULA PLACEMENT  11/04/2010   left and left  . AV FISTULA PLACEMENT  11/20/2011   Procedure: INSERTION OF ARTERIOVENOUS (AV) GORE-TEX GRAFT ARM;  Surgeon: Angelia Mould, MD;  Location: [place];  Service: Vascular;  Laterality:  Right;  . AV FISTULA PLACEMENT Right 04/19/2015   Procedure: Excision of infected RUA AVG;  Surgeon: Conrad Hinton, MD;  Location: [place];  Service: Vascular;  Laterality: Right;  . AV FISTULA PLACEMENT Left 10/04/2015   Procedure: INSERTION OF ARTERIOVENOUS (AV) GORE-TEX GRAFT ARM;  Surgeon: Conrad Mekoryuk, MD;  Location: [place];  Service: Vascular;  Laterality: Left;  . [place] REMOVAL Left 10/13/2015   Procedure: REMOVAL OF INFECTED ARTERIOVENOUS GORETEX LEFT GRAFT ;  Surgeon: Conrad [place], MD;  Location: [place];  Service: Vascular;  Laterality: Left;  . BASCILIC VEIN TRANSPOSITION Left 06/19/2015   Procedure: FIRST STAGE BRACHIAL VEIN TRANSPOSITION;  Surgeon: Conrad Raritan, MD;  Location: [place];  Service: Vascular;  Laterality: Left;  . 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 COLONOSCOPY N/A 11/10/2012   AUQ:[id number] proctitis/Pancolonic diverticulosis/(TUBULAR ADENOMA)5 mm polyp in the transverse segment/4 mm polyps in the base of the cecum and diminutive polyp in the descending segment. INADEQUATE BOWEL PREP  . COLONOSCOPY N/A 11/28/2013   RMR: Radiation proctitis. Colonic diverticulosis. Colonic polyps-removed as described above.   . INSERTION OF DIALYSIS CATHETER Right 01/19/2016   Procedure: INSERTION OF Right internal jugular  DIALYSIS CATHETER.;  Surgeon: Elam Dutch, MD;  Location: [place];  Service: Vascular;  Laterality: Right;  . PERIPHERAL VASCULAR CATHETERIZATION Bilateral 12/06/2015   Procedure:  Upper Extremity Venography/ central venogram;  Surgeon: Conrad Mellott, MD;  Location: [place] CV LAB;  Service: Cardiovascular;  Laterality: Bilateral;  . PERIPHERAL VASCULAR CATHETERIZATION N/A 05/29/2016   Procedure: Upper Extremity Angiography;  Surgeon: Conrad Clyde, MD;  Location: [place] CV LAB;  Service: Cardiovascular;  Laterality: N/A;  . PERIPHERAL VASCULAR CATHETERIZATION N/A 05/29/2016   Procedure: Lower Extremity Angiography;  Surgeon: Conrad Grover, MD;  Location: [place] CV LAB;  Service: Cardiovascular;  Laterality: N/A;  . PERIPHERAL VASCULAR CATHETERIZATION  05/29/2016   Procedure: Peripheral Vascular Intervention;  Surgeon: Conrad Wharton, MD;  Location: [place] CV LAB;  Service: Cardiovascular;;  . REMOVAL OF A HERO DEVICE Right 01/19/2016   Procedure: REMOVAL OF A HERO DEVICE;  Surgeon: Elam Dutch, MD;  Location: [place];  Service: Vascular;  Laterality: Right;  . SHUNTOGRAM N/A 10/08/2012   Procedure: [name];  Surgeon: Elam Dutch, MD;  Location: [facility] C[facility];  Service: Cardiovascular;  Laterality: N/A;  . VASCULAR ACCESS DEVICE INSERTION Right 01/01/2016   Procedure: INSERTION OF HERO VASCULAR ACCESS DEVICE - RIGHT;  Surgeon: Conrad Winnie, MD;  Location: [place];  Service: Vascular;  Laterality: Right;    Current Outpatient Prescriptions  Medication Sig Dispense Refill  . buPROPion (WELLBUTRIN XL) 150 MG 24 hr tablet Take 150 mg by mouth daily.    . calcium acetate (PHOSLO) 667 MG capsule Take 667-2,001 mg by mouth daily. Pt takes three capsules three times daily with meals and one capsule two times daily with snacks.    . carboxymethylcellulose (REFRESH PLUS) 0.5 % SOLN Place 1 drop into both eyes at bedtime.    . cinacalcet (SENSIPAR) 30 MG tablet Take 30 mg by mouth daily.    . cloNIDine (CATAPRES) 0.2 MG tablet Take 1 tablet (0.2 mg total) by mouth 3 (three) times daily. 42 tablet 0  . doxazosin (CARDURA) 8 MG tablet Take 8 mg by mouth daily.     . fluorometholone (FML) 0.1 % ophthalmic suspension Place 1 drop into both eyes at bedtime.    . furosemide (LASIX) 40 MG tablet Take 40 mg by mouth 2 (two) times daily.     Marland Kitchen gabapentin (NEURONTIN) 100 MG capsule Take 100 mg by mouth 2 (two) times daily.    . Insulin Detemir (LEVEMIR FLEXTOUCH) 100 UNIT/ML Pen Inject 20 Units into the skin at bedtime.     . Linaclotide (LINZESS) 145 MCG CAPS capsule Take 1 capsule (145 mcg total) by mouth daily. Take 30 minutes before breakfast 30 capsule  11  . Nutritional Supplements (FEEDING SUPPLEMENT, NEPRO CARB STEADY,) LIQD Take 237 mLs by mouth 2 (two) times daily between meals. 2 Can 0  . omeprazole (PRILOSEC) 20 MG capsule Take 20 mg by mouth daily.     Marland Kitchen oxyCODONE (OXY IR/ROXICODONE) 5 MG immediate release tablet Take 5 mg by mouth every 6 (six) hours as needed for severe pain.     Marland Kitchen psyllium (METAMUCIL) 58.6 % powder Take 1 packet by mouth 3 (three) times daily as needed (for constipation).     . QUEtiapine (SEROQUEL) 100 MG tablet Take 200 mg by mouth at bedtime.     . saxagliptin HCl (ONGLYZA) 5 MG TABS tablet Take 5 mg by mouth daily.     . sevelamer carbonate (RENVELA) 800 MG tablet Take 1,600 mg by mouth 3 (three) times daily with meals.     . simvastatin (ZOCOR) 20 MG tablet Take 20 mg by  mouth at bedtime.      No current facility-administered medications for this visit.     Allergies as of 02/05/2017  . (No Known Allergies)    Family History  Problem Relation Age of Onset  . Heart disease Mother   . Diabetes Mother   . Hypertension Mother   . Stroke Mother   . Heart disease Father   . Diabetes Father   . Heart disease Sister   . Diabetes Brother   . Colon cancer Neg Hx     Social History   Social History  . Marital status: Married    Spouse name: N/A  . Number of children: N/A  . Years of education: N/A   Social History Main Topics  . Smoking status: Light Tobacco Smoker    Packs/day: 0.25    Years: 50.00    Types: Cigarettes  . Smokeless tobacco: Never Used     Comment: 4-5 cigarettes daily  . Alcohol use No  . Drug use: No  . Sexual activity: Not on file   Other Topics Concern  . Not on file   Social History Narrative  . No narrative on file    Review of Systems: General: Negative for anorexia, weight loss, fever, chills, fatigue, weakness. Eyes: Negative for vision changes.  ENT: Negative for hoarseness, difficulty swallowing , nasal congestion. CV: Negative for chest pain, angina,  palpitations, dyspnea on exertion, peripheral edema.  Respiratory: Negative for dyspnea at rest, dyspnea on exertion, cough, sputum, wheezing.  GI: See history of present illness. GU:  Negative for dysuria, hematuria, urinary incontinence, urinary frequency, nocturnal urination.  MS: Negative for joint pain, low back pain.  Derm: Negative for rash or itching.  Neuro: Negative for weakness, abnormal sensation, seizure, frequent headaches, memory loss, confusion.  Psych: Negative for anxiety, depression, suicidal ideation, hallucinations.  Endo: Negative for unusual weight change.  Heme: Negative for bruising or bleeding. Allergy: Negative for rash or hives.   Physical Exam: There were no vitals taken for this visit. General:   Alert and oriented. Pleasant and cooperative. Well-nourished and well-developed.  Head:  Normocephalic and atraumatic. Eyes:  Without icterus, sclera clear and conjunctiva pink.  Ears:  Normal auditory acuity. Mouth:  No deformity or lesions, oral mucosa pink.  Throat/Neck:  Supple, without mass or thyromegaly. Cardiovascular:  S1, S2 present without murmurs appreciated. Normal pulses noted. Extremities without clubbing or edema. Respiratory:  Clear to auscultation bilaterally. No wheezes, rales, or rhonchi. No distress.  Gastrointestinal:  +BS, soft, non-tender and non-distended. No HSM noted. No guarding or rebound. No masses appreciated.  Rectal:  Deferred  Musculoskalatal:  Symmetrical without gross deformities. Normal posture. Skin:  Intact without significant lesions or rashes. Neurologic:  Alert and oriented x4;  grossly normal neurologically. Psych:  Alert and cooperative. Normal mood and affect. Heme/Lymph/Immune: No significant cervical adenopathy. No excessive bruising noted.    02/05/2017 10:53 AM   Disclaimer: This note was dictated with voice recognition software. Similar sounding words can inadvertently be transcribed and may not be corrected  upon review.

## 2017-02-05 NOTE — Telephone Encounter (Signed)
Patient was a no show and letter sent  °

## 2017-02-05 NOTE — Telephone Encounter (Signed)
Noted  

## 2017-02-06 ENCOUNTER — Ambulatory Visit (INDEPENDENT_AMBULATORY_CARE_PROVIDER_SITE_OTHER): Payer: Medicare Other | Admitting: Vascular Surgery

## 2017-02-06 ENCOUNTER — Encounter: Payer: Self-pay | Admitting: Vascular Surgery

## 2017-02-06 ENCOUNTER — Other Ambulatory Visit: Payer: Self-pay

## 2017-02-06 VITALS — BP 144/78 | HR 86 | Temp 97.6°F | Ht 69.0 in | Wt 142.8 lb

## 2017-02-06 DIAGNOSIS — M79641 Pain in right hand: Secondary | ICD-10-CM | POA: Diagnosis not present

## 2017-02-06 DIAGNOSIS — I70222 Atherosclerosis of native arteries of extremities with rest pain, left leg: Secondary | ICD-10-CM | POA: Diagnosis not present

## 2017-02-06 NOTE — Progress Notes (Signed)
Patient name: Gerald Davenport MRN: [id number] DOB: Jul 19, 1941 Sex: male  REASON FOR VISIT: follow-up  HPI: TAHJAE Davenport is a 76 y.o. male who presents for follow-up of his right arm digital and radial disease and left lower extremity PAD. The patient had bilateral upper extremity steal complications. At his last visit in January 2018, he was referred to [place] given progressive right hand pain and weakness. He underwent work-up but was not recommended surgery. He continues to have pain and numbness with his right hand. He also reports some mild pain with his left hand but this is tolerable.   He also reports pain in his left foot at night. He reports a wound on his left ankle, but this has healed. 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 He continues to smoke.  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  Past Medical History:  Diagnosis Date  . Anemia   . Anxiety   . Cancer [facility])    Prostate cancer treated with radiation therapy  . Chronic kidney disease    dialysis Mon, Wednes, Fri  . Clot 05/2006   Arterial clot right leg   . Constipation   . Diabetes mellitus    Type II  . Diverticulosis   . GERD (gastroesophageal reflux disease)   . Hemodialysis patient ([place]) 6/13  . Hyperlipidemia   . Hypertension   . Infection    drainage from distal incision of left upper arm arteriovenous graft  . Positive PPD   . Shortness of breath dyspnea    with exertion, when he has too much fluid  . Steal syndrome dialysis vascular access ([place])   . Thyroid disease    hyperparathyroidism  . Wears glasses     Family History  Problem Relation Age of Onset  . Heart disease Mother   . Diabetes Mother   . Hypertension Mother   . Stroke Mother   . Heart disease Father   . Diabetes Father   . Heart disease Sister   . Diabetes Brother   . Colon cancer Neg Hx     SOCIAL HISTORY: Social History  Substance Use Topics  . Smoking status: Light  Tobacco Smoker    Packs/day: 0.25    Years: 50.00    Types: Cigarettes  . Smokeless tobacco: Never Used     Comment: 4-5 cigarettes daily  . Alcohol use No    No Known Allergies  Current Outpatient Prescriptions  Medication Sig Dispense Refill  . buPROPion (WELLBUTRIN XL) 150 MG 24 hr tablet Take 150 mg by mouth daily.    . calcium acetate (PHOSLO) 667 MG capsule Take 667-2,001 mg by mouth daily. Pt takes three capsules three times daily with meals and one capsule two times daily with snacks.    . carboxymethylcellulose (REFRESH PLUS) 0.5 % SOLN Place 1 drop into both eyes at bedtime.    . cinacalcet (SENSIPAR) 30 MG tablet Take 30 mg by mouth daily.    . cloNIDine (CATAPRES) 0.2 MG tablet Take 1 tablet (0.2 mg total) by mouth 3 (three) times daily. 42 tablet 0  . doxazosin (CARDURA) 8 MG tablet Take 8 mg by mouth daily.     . fluorometholone (FML) 0.1 % ophthalmic suspension Place 1 drop into both eyes at bedtime.    . furosemide (LASIX) 40 MG tablet Take 40 mg by mouth 2 (two) times daily.     Marland Kitchen gabapentin (NEURONTIN)  100 MG capsule Take 100 mg by mouth 2 (two) times daily.    . Insulin Detemir (LEVEMIR FLEXTOUCH) 100 UNIT/ML Pen Inject 20 Units into the skin at bedtime.     . Linaclotide (LINZESS) 145 MCG CAPS capsule Take 1 capsule (145 mcg total) by mouth daily. Take 30 minutes before breakfast 30 capsule 11  . Nutritional Supplements (FEEDING SUPPLEMENT, NEPRO CARB STEADY,) LIQD Take 237 mLs by mouth 2 (two) times daily between meals. 2 Can 0  . omeprazole (PRILOSEC) 20 MG capsule Take 20 mg by mouth daily.     Marland Kitchen oxyCODONE (OXY IR/ROXICODONE) 5 MG immediate release tablet Take 5 mg by mouth every 6 (six) hours as needed for severe pain.     Marland Kitchen psyllium (METAMUCIL) 58.6 % powder Take 1 packet by mouth 3 (three) times daily as needed (for constipation).     . QUEtiapine (SEROQUEL) 100 MG tablet Take 200 mg by mouth at bedtime.     . saxagliptin HCl (ONGLYZA) 5 MG TABS tablet Take 5 mg  by mouth daily.     . sevelamer carbonate (RENVELA) 800 MG tablet Take 1,600 mg by mouth 3 (three) times daily with meals.     . simvastatin (ZOCOR) 20 MG tablet Take 20 mg by mouth at bedtime.      No current facility-administered medications for this visit.     REVIEW OF SYSTEMS:  [X]  denotes positive finding, [ ]  denotes negative finding Cardiac  Comments:  Chest pain or chest pressure:    Shortness of breath upon exertion:    Short of breath when lying flat:    Irregular heart rhythm:        Vascular    Pain in calf, thigh, or hip brought on by ambulation:    Pain in feet at night that wakes you up from your sleep:  x Left foot   Blood clot in your veins:    Leg swelling:         Pulmonary    Oxygen at home:    Productive cough:     Wheezing:         Neurologic    Sudden weakness in arms or legs:     Sudden numbness in arms or legs:     Sudden onset of difficulty speaking or slurred speech:    Temporary loss of vision in one eye:     Problems with dizziness:         Gastrointestinal    Blood in stool:     Vomited blood:         Genitourinary    Burning when urinating:     Blood in urine:        Psychiatric    Major depression:         Hematologic    Bleeding problems:    Problems with blood clotting too easily:        Skin    Rashes or ulcers:        Constitutional    Fever or chills:      PHYSICAL EXAM: Vitals:   02/06/17 1325  BP: (!) 144/78  Pulse: 86  Temp: 97.6 F (36.4 C)  SpO2: 98%  Weight: 142 lb 12.8 oz (64.8 kg)  Height: 5\' 9"  (1.753 m)    GENERAL: The patient is a well-nourished male, in no acute distress. The vital signs are documented above. HEENT: normocephalic, atraumatic. No abnormalities noted.  CARDIAC: There is a regular rate and rhythm.  No carotid bruits.  VASCULAR: 2+ brachial pulses bilaterally. Non palpable radial and ulnar pulses bilaterally. 2+ femoral pulses bilaterally. Non palpable left popliteal or pedal pulses.    PULMONARY: Decreased breath sounds bilaterally.  MUSCULOSKELETAL: Bilateral hand atrophy, right worse than left Right BKA NEUROLOGIC: Weakened grip strength bilaterally.  SKIN: onychomycosis left toes. No open wounds seen. Scaling of left leg.  PSYCHIATRIC: The patient has a normal affect.  DATA:  ABIs 02/04/17  R: BKA L: 0.66 with monophasic waveforms  MEDICAL ISSUES: Right arm digital and radial disease  Patient was referred to [place], Dr. Nicoletta Dress and underwent work-up. Was not recommended surgery. Continues to have pain and numbness with right hand with significant hand atrophy. No non healing wounds seen. Will refer to [facility] for revascularization as microsurgery will need to be involved.   Left lower extremity PAD Now having left foot rest pain. Plan for arteriogram with left leg runoff and possible intervention on 02/12/17 with Dr. Bridgett Larsson. On statin. Was previously on ASA but this was discontinued by one of his physicians.   Virgina Jock, PA-C [facility] MD: Bridgett Larsson  Addendum  I have independently interviewed and examined the patient, and I agree with the physician assistant's findings.  Right hand remains viable but his motor is attenuated and he has some clawing evident.  On his R arm angiogram, his radial artery occludes distally with perfusion via ulnar artery into a partial arch.  His arteries appears to be <2 mm in size.  I think a second opinion from [facility] is reasonable, as I don't have anything to offer as no one in my practice is trained in microsurgery.   Adele Barthel, MD, FACS [facility] [place] Office: [phone] Pager: [phone]  02/06/2017, 4:29 PM

## 2017-02-09 ENCOUNTER — Telehealth: Payer: Self-pay | Admitting: Vascular Surgery

## 2017-02-09 NOTE — Telephone Encounter (Signed)
Referral sent to [facility] for Revascularization of the R hand needing microsurgery

## 2017-02-12 ENCOUNTER — Observation Stay (HOSPITAL_COMMUNITY)
Admission: RE | Admit: 2017-02-12 | Discharge: 2017-02-13 | Disposition: A | Discharge status: Home / Self Care | Payer: Medicare Other | Source: Ambulatory Visit | Attending: Vascular Surgery | Admitting: Vascular Surgery

## 2017-02-12 ENCOUNTER — Encounter (HOSPITAL_COMMUNITY)
Admission: RE | Disposition: A | Discharge status: Home / Self Care | Payer: Self-pay | Source: Ambulatory Visit | Attending: Vascular Surgery

## 2017-02-12 ENCOUNTER — Encounter (HOSPITAL_COMMUNITY): Payer: Self-pay | Admitting: *Deleted

## 2017-02-12 DIAGNOSIS — D631 Anemia in chronic kidney disease: Secondary | ICD-10-CM | POA: Diagnosis not present

## 2017-02-12 DIAGNOSIS — F419 Anxiety disorder, unspecified: Secondary | ICD-10-CM | POA: Diagnosis not present

## 2017-02-12 DIAGNOSIS — E1122 Type 2 diabetes mellitus with diabetic chronic kidney disease: Secondary | ICD-10-CM | POA: Diagnosis not present

## 2017-02-12 DIAGNOSIS — Z992 Dependence on renal dialysis: Secondary | ICD-10-CM | POA: Insufficient documentation

## 2017-02-12 DIAGNOSIS — E11649 Type 2 diabetes mellitus with hypoglycemia without coma: Secondary | ICD-10-CM | POA: Insufficient documentation

## 2017-02-12 DIAGNOSIS — N189 Chronic kidney disease, unspecified: Secondary | ICD-10-CM | POA: Diagnosis not present

## 2017-02-12 DIAGNOSIS — Z8249 Family history of ischemic heart disease and other diseases of the circulatory system: Secondary | ICD-10-CM | POA: Insufficient documentation

## 2017-02-12 DIAGNOSIS — Z89511 Acquired absence of right leg below knee: Secondary | ICD-10-CM | POA: Diagnosis not present

## 2017-02-12 DIAGNOSIS — I70229 Atherosclerosis of native arteries of extremities with rest pain, unspecified extremity: Secondary | ICD-10-CM | POA: Diagnosis present

## 2017-02-12 DIAGNOSIS — I70222 Atherosclerosis of native arteries of extremities with rest pain, left leg: Secondary | ICD-10-CM | POA: Diagnosis present

## 2017-02-12 DIAGNOSIS — E079 Disorder of thyroid, unspecified: Secondary | ICD-10-CM | POA: Insufficient documentation

## 2017-02-12 DIAGNOSIS — Z794 Long term (current) use of insulin: Secondary | ICD-10-CM | POA: Insufficient documentation

## 2017-02-12 DIAGNOSIS — F17210 Nicotine dependence, cigarettes, uncomplicated: Secondary | ICD-10-CM | POA: Insufficient documentation

## 2017-02-12 DIAGNOSIS — E1151 Type 2 diabetes mellitus with diabetic peripheral angiopathy without gangrene: Secondary | ICD-10-CM | POA: Insufficient documentation

## 2017-02-12 DIAGNOSIS — I129 Hypertensive chronic kidney disease with stage 1 through stage 4 chronic kidney disease, or unspecified chronic kidney disease: Secondary | ICD-10-CM | POA: Diagnosis not present

## 2017-02-12 DIAGNOSIS — E785 Hyperlipidemia, unspecified: Secondary | ICD-10-CM | POA: Insufficient documentation

## 2017-02-12 DIAGNOSIS — K219 Gastro-esophageal reflux disease without esophagitis: Secondary | ICD-10-CM | POA: Diagnosis not present

## 2017-02-12 DIAGNOSIS — E213 Hyperparathyroidism, unspecified: Secondary | ICD-10-CM | POA: Insufficient documentation

## 2017-02-12 DIAGNOSIS — R7611 Nonspecific reaction to tuberculin skin test without active tuberculosis: Secondary | ICD-10-CM | POA: Diagnosis not present

## 2017-02-12 DIAGNOSIS — Z923 Personal history of irradiation: Secondary | ICD-10-CM | POA: Diagnosis not present

## 2017-02-12 HISTORY — PX: PERIPHERAL VASCULAR BALLOON ANGIOPLASTY: CATH118281

## 2017-02-12 HISTORY — PX: ABDOMINAL AORTOGRAM W/LOWER EXTREMITY: CATH118223

## 2017-02-12 LAB — POCT I-STAT, CHEM 8
BUN: 38 mg/dL — AB (ref 6–20)
CREATININE: 7.5 mg/dL — AB (ref 0.61–1.24)
Calcium, Ion: 1.02 mmol/L — ABNORMAL LOW (ref 1.15–1.40)
Chloride: 99 mmol/L — ABNORMAL LOW (ref 101–111)
GLUCOSE: 131 mg/dL — AB (ref 65–99)
HEMATOCRIT: 47 % (ref 39.0–52.0)
Hemoglobin: 16 g/dL (ref 13.0–17.0)
Potassium: 5.1 mmol/L (ref 3.5–5.1)
Sodium: 139 mmol/L (ref 135–145)
TCO2: 32 mmol/L (ref 0–100)

## 2017-02-12 LAB — POCT ACTIVATED CLOTTING TIME
ACTIVATED CLOTTING TIME: 202 s
Activated Clotting Time: 285 seconds
Activated Clotting Time: 235 seconds
Activated Clotting Time: 175 seconds

## 2017-02-12 LAB — GLUCOSE, CAPILLARY
GLUCOSE-CAPILLARY: 108 mg/dL — AB (ref 65–99)
GLUCOSE-CAPILLARY: 240 mg/dL — AB (ref 65–99)

## 2017-02-12 LAB — CBC
HCT: 41.1 % (ref 39.0–52.0)
Hemoglobin: 13.3 g/dL (ref 13.0–17.0)
MCH: 34.1 pg — AB (ref 26.0–34.0)
MCHC: 32.4 g/dL (ref 30.0–36.0)
MCV: 105.4 fL — ABNORMAL HIGH (ref 78.0–100.0)
Platelets: 176 10*3/uL (ref 150–400)
RBC: 3.9 MIL/uL — AB (ref 4.22–5.81)
RDW: 17.3 % — ABNORMAL HIGH (ref 11.5–15.5)
WBC: 9.2 10*3/uL (ref 4.0–10.5)

## 2017-02-12 LAB — CREATININE, SERUM
Creatinine, Ser: 8.12 mg/dL — ABNORMAL HIGH (ref 0.61–1.24)
GFR calc Af Amer: 6 mL/min — ABNORMAL LOW (ref >60)
GFR calc non Af Amer: 6 mL/min — ABNORMAL LOW (ref >60)

## 2017-02-12 SURGERY — ABDOMINAL AORTOGRAM W/LOWER EXTREMITY
Anesthesia: LOCAL

## 2017-02-12 MED ORDER — ONDANSETRON HCL 4 MG/2ML IJ SOLN: 4.0000 mg | Freq: Four times a day (QID) | INTRAMUSCULAR | Status: DC | PRN

## 2017-02-12 MED ORDER — HYDRALAZINE HCL 20 MG/ML IJ SOLN
INTRAMUSCULAR | Status: AC
  Filled 2017-02-12: qty 1

## 2017-02-12 MED ORDER — LABETALOL HCL 5 MG/ML IV SOLN
INTRAVENOUS | Status: AC
  Filled 2017-02-12: qty 4

## 2017-02-12 MED ORDER — CARBOXYMETHYLCELLULOSE SODIUM 0.5 % OP SOLN: 1.0000 [drp] | Freq: Every day | OPHTHALMIC | Status: DC

## 2017-02-12 MED ORDER — POLYVINYL ALCOHOL 1.4 % OP SOLN
1.0000 [drp] | Freq: Every day | OPHTHALMIC | Status: DC
  Administered 2017-02-12: 22:00:00 1 [drp] via OPHTHALMIC
  Filled 2017-02-12: qty 15

## 2017-02-12 MED ORDER — FENTANYL CITRATE (PF) 100 MCG/2ML IJ SOLN
INTRAMUSCULAR | Status: AC
  Filled 2017-02-12: qty 2

## 2017-02-12 MED ORDER — OXYCODONE HCL 5 MG PO TABS: 5.0000 mg | ORAL_TABLET | Freq: Four times a day (QID) | ORAL | Status: DC | PRN

## 2017-02-12 MED ORDER — HEPARIN (PORCINE) IN NACL 2-0.9 UNIT/ML-% IJ SOLN
INTRAMUSCULAR | Status: AC
  Filled 2017-02-12: qty 1000

## 2017-02-12 MED ORDER — INSULIN DETEMIR 100 UNIT/ML ~~LOC~~ SOLN
10.0000 [IU] | Freq: Every day | SUBCUTANEOUS | Status: DC
  Administered 2017-02-12: 10 [IU] via SUBCUTANEOUS
  Filled 2017-02-12 (×2): qty 0.1

## 2017-02-12 MED ORDER — SODIUM CHLORIDE 0.9% FLUSH: 3.0000 mL | INTRAVENOUS | Status: DC | PRN

## 2017-02-12 MED ORDER — DOXAZOSIN MESYLATE 8 MG PO TABS
8.0000 mg | ORAL_TABLET | Freq: Every day | ORAL | Status: DC
  Administered 2017-02-12: 8 mg via ORAL
  Filled 2017-02-12 (×2): qty 1

## 2017-02-12 MED ORDER — LIDOCAINE HCL 1 % IJ SOLN
INTRAMUSCULAR | Status: AC
  Filled 2017-02-12: qty 20

## 2017-02-12 MED ORDER — PANTOPRAZOLE SODIUM 40 MG PO TBEC
40.0000 mg | DELAYED_RELEASE_TABLET | Freq: Every day | ORAL | Status: DC
  Administered 2017-02-12: 22:00:00 40 mg via ORAL
  Filled 2017-02-12: qty 1

## 2017-02-12 MED ORDER — LIDOCAINE HCL (PF) 1 % IJ SOLN
INTRAMUSCULAR | Status: DC | PRN
  Administered 2017-02-12: 15 mL

## 2017-02-12 MED ORDER — FUROSEMIDE 40 MG PO TABS
40.0000 mg | ORAL_TABLET | Freq: Two times a day (BID) | ORAL | Status: DC
  Administered 2017-02-12 – 2017-02-13 (×2): 40 mg via ORAL
  Filled 2017-02-12 (×2): qty 1

## 2017-02-12 MED ORDER — QUETIAPINE FUMARATE 200 MG PO TABS
200.0000 mg | ORAL_TABLET | Freq: Every day | ORAL | Status: DC
  Administered 2017-02-12: 200 mg via ORAL
  Filled 2017-02-12: qty 1

## 2017-02-12 MED ORDER — SODIUM CHLORIDE 0.9 % IV SOLN: 250.0000 mL | INTRAVENOUS | Status: DC | PRN

## 2017-02-12 MED ORDER — SODIUM CHLORIDE 0.9% FLUSH: 3.0000 mL | Freq: Two times a day (BID) | INTRAVENOUS | Status: DC

## 2017-02-12 MED ORDER — ANGIOPLASTY BOOK
Freq: Once | Status: AC
  Administered 2017-02-12: 20:00:00
  Filled 2017-02-12: qty 1

## 2017-02-12 MED ORDER — OXYCODONE-ACETAMINOPHEN 5-325 MG PO TABS: 1.0000 | ORAL_TABLET | Freq: Four times a day (QID) | ORAL | Status: DC | PRN

## 2017-02-12 MED ORDER — SEVELAMER CARBONATE 800 MG PO TABS: 800.0000 mg | ORAL_TABLET | Freq: Three times a day (TID) | ORAL | Status: DC

## 2017-02-12 MED ORDER — HEPARIN SODIUM (PORCINE) 5000 UNIT/ML IJ SOLN
5000.0000 [IU] | Freq: Three times a day (TID) | INTRAMUSCULAR | Status: DC
  Administered 2017-02-13: 5000 [IU] via SUBCUTANEOUS
  Filled 2017-02-12: qty 1

## 2017-02-12 MED ORDER — FLUOROMETHOLONE 0.1 % OP SUSP
1.0000 [drp] | Freq: Every day | OPHTHALMIC | Status: DC
  Administered 2017-02-12: 22:00:00 1 [drp] via OPHTHALMIC
  Filled 2017-02-12: qty 5

## 2017-02-12 MED ORDER — LABETALOL HCL 5 MG/ML IV SOLN: 10.0000 mg | INTRAVENOUS | Status: DC | PRN

## 2017-02-12 MED ORDER — HYDRALAZINE HCL 20 MG/ML IJ SOLN: 10.0000 mg | INTRAMUSCULAR | Status: DC | PRN

## 2017-02-12 MED ORDER — HYDRALAZINE HCL 20 MG/ML IJ SOLN: 5.0000 mg | INTRAMUSCULAR | Status: DC | PRN

## 2017-02-12 MED ORDER — SIMVASTATIN 20 MG PO TABS
20.0000 mg | ORAL_TABLET | Freq: Every day | ORAL | Status: DC
  Administered 2017-02-12: 22:00:00 20 mg via ORAL
  Filled 2017-02-12: qty 1

## 2017-02-12 MED ORDER — HEPARIN SODIUM (PORCINE) 1000 UNIT/ML IJ SOLN
INTRAMUSCULAR | Status: DC | PRN
  Administered 2017-02-12: 7000 [IU] via INTRAVENOUS

## 2017-02-12 MED ORDER — METOPROLOL TARTRATE 5 MG/5ML IV SOLN: 2.0000 mg | INTRAVENOUS | Status: DC | PRN

## 2017-02-12 MED ORDER — FENTANYL CITRATE (PF) 100 MCG/2ML IJ SOLN
INTRAMUSCULAR | Status: DC | PRN
  Administered 2017-02-12: 50 ug via INTRAVENOUS

## 2017-02-12 MED ORDER — CLONIDINE HCL 0.1 MG PO TABS
0.2000 mg | ORAL_TABLET | Freq: Three times a day (TID) | ORAL | Status: DC
  Administered 2017-02-12 (×2): 0.2 mg via ORAL
  Filled 2017-02-12: qty 1
  Filled 2017-02-12: qty 2
  Filled 2017-02-12: qty 1

## 2017-02-12 MED ORDER — CLOPIDOGREL BISULFATE 75 MG PO TABS: 75.0000 mg | ORAL_TABLET | Freq: Every day | ORAL | 11 refills | Status: DC

## 2017-02-12 MED ORDER — MIDAZOLAM HCL 2 MG/2ML IJ SOLN
INTRAMUSCULAR | Status: DC | PRN
  Administered 2017-02-12: 1 mg via INTRAVENOUS

## 2017-02-12 MED ORDER — GABAPENTIN 100 MG PO CAPS
100.0000 mg | ORAL_CAPSULE | Freq: Two times a day (BID) | ORAL | Status: DC
  Administered 2017-02-12: 100 mg via ORAL
  Filled 2017-02-12: qty 1

## 2017-02-12 MED ORDER — BUPROPION HCL ER (XL) 150 MG PO TB24
150.0000 mg | ORAL_TABLET | Freq: Every day | ORAL | Status: DC
  Administered 2017-02-12: 150 mg via ORAL
  Filled 2017-02-12 (×2): qty 1

## 2017-02-12 MED ORDER — CALCIUM ACETATE (PHOS BINDER) 667 MG PO CAPS
667.0000 mg | ORAL_CAPSULE | Freq: Three times a day (TID) | ORAL | Status: DC
  Administered 2017-02-12 – 2017-02-13 (×2): 667 mg via ORAL
  Filled 2017-02-12 (×2): qty 1

## 2017-02-12 MED ORDER — LINACLOTIDE 145 MCG PO CAPS
145.0000 ug | ORAL_CAPSULE | Freq: Every day | ORAL | Status: DC
  Administered 2017-02-13: 06:00:00 145 ug via ORAL
  Filled 2017-02-12: qty 1

## 2017-02-12 MED ORDER — MIDAZOLAM HCL 2 MG/2ML IJ SOLN
INTRAMUSCULAR | Status: AC
  Filled 2017-02-12: qty 2

## 2017-02-12 MED ORDER — LINAGLIPTIN 5 MG PO TABS: 5.0000 mg | ORAL_TABLET | Freq: Every day | ORAL | Status: DC

## 2017-02-12 MED ORDER — HYDRALAZINE HCL 20 MG/ML IJ SOLN
INTRAMUSCULAR | Status: DC | PRN
  Administered 2017-02-12 (×2): 10 mg via INTRAVENOUS

## 2017-02-12 MED ORDER — IODIXANOL 320 MG/ML IV SOLN
INTRAVENOUS | Status: DC | PRN
  Administered 2017-02-12: 170 mL via INTRA_ARTERIAL

## 2017-02-12 MED ORDER — CINACALCET HCL 30 MG PO TABS
30.0000 mg | ORAL_TABLET | Freq: Every day | ORAL | Status: DC
  Administered 2017-02-13: 08:00:00 30 mg via ORAL
  Filled 2017-02-12: qty 1

## 2017-02-12 MED ORDER — HEPARIN (PORCINE) IN NACL 2-0.9 UNIT/ML-% IJ SOLN
INTRAMUSCULAR | Status: AC | PRN
  Administered 2017-02-12: 1000 mL

## 2017-02-12 MED ORDER — CLOPIDOGREL BISULFATE 75 MG PO TABS
300.0000 mg | ORAL_TABLET | Freq: Once | ORAL | Status: AC
  Administered 2017-02-12: 300 mg via ORAL
  Filled 2017-02-12: qty 4

## 2017-02-12 SURGICAL SUPPLY — 22 items
BALLN MUSTANG 3X100X135 (BALLOONS) ×3
BALLOON MUSTANG 3X100X135 (BALLOONS) IMPLANT
CATH OMNI FLUSH 5F 65CM (CATHETERS) ×1 IMPLANT
CATH QUICKCROSS .035X135CM (MICROCATHETER) ×1 IMPLANT
COVER PRB 48X5XTLSCP FOLD TPE (BAG) IMPLANT
COVER PROBE 5X48 (BAG) ×3
DEVICE CONTINUOUS FLUSH (MISCELLANEOUS) ×1 IMPLANT
DEVICE TORQUE .025-.038 (MISCELLANEOUS) ×1 IMPLANT
GUIDEWIRE ANGLED .035X260CM (WIRE) ×1 IMPLANT
KIT ENCORE 26 ADVANTAGE (KITS) ×1 IMPLANT
KIT MICROINTRODUCER STIFF 5F (SHEATH) ×1 IMPLANT
KIT PV (KITS) ×3 IMPLANT
SHEATH PINNACLE 5F 10CM (SHEATH) ×1 IMPLANT
SHEATH PINNACLE MP 6F 45CM (SHEATH) ×1 IMPLANT
SYR MEDRAD MARK V 150ML (SYRINGE) ×3 IMPLANT
TAPE VIPERTRACK RADIOPAQ (MISCELLANEOUS) IMPLANT
TAPE VIPERTRACK RADIOPAQUE (MISCELLANEOUS) ×3
TRANSDUCER W/STOPCOCK (MISCELLANEOUS) ×3 IMPLANT
TRAY PV CATH (CUSTOM PROCEDURE TRAY) ×3 IMPLANT
WIRE BENTSON .035X145CM (WIRE) ×1 IMPLANT
WIRE HI TORQ VERSACORE J 260CM (WIRE) ×1 IMPLANT
WIRE ROSEN-J .035X180CM (WIRE) ×1 IMPLANT

## 2017-02-12 NOTE — H&P (View-Only) (Signed)
Patient name: Gerald Davenport MRN: [id number] DOB: 1941-02-16 Sex: male  REASON FOR VISIT: follow-up  HPI: Gerald Davenport is a 76 y.o. male who presents for follow-up of his right arm digital and radial disease and left lower extremity PAD. The patient had bilateral upper extremity steal complications. At his last visit in January 2018, he was referred to [place] given progressive right hand pain and weakness. He underwent work-up but was not recommended surgery. He continues to have pain and numbness with his right hand. He also reports some mild pain with his left hand but this is tolerable.   He also reports pain in his left foot at night. He reports a wound on his left ankle, but this has healed. 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 He continues to smoke.  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  Past Medical History:  Diagnosis Date  . Anemia   . Anxiety   . Cancer [facility])    Prostate cancer treated with radiation therapy  . Chronic kidney disease    dialysis Mon, Wednes, Fri  . Clot 05/2006   Arterial clot right leg   . Constipation   . Diabetes mellitus    Type II  . Diverticulosis   . GERD (gastroesophageal reflux disease)   . Hemodialysis patient ([place]) 6/13  . Hyperlipidemia   . Hypertension   . Infection    drainage from distal incision of left upper arm arteriovenous graft  . Positive PPD   . Shortness of breath dyspnea    with exertion, when he has too much fluid  . Steal syndrome dialysis vascular access ([place])   . Thyroid disease    hyperparathyroidism  . Wears glasses     Family History  Problem Relation Age of Onset  . Heart disease Mother   . Diabetes Mother   . Hypertension Mother   . Stroke Mother   . Heart disease Father   . Diabetes Father   . Heart disease Sister   . Diabetes Brother   . Colon cancer Neg Hx     SOCIAL HISTORY: Social History  Substance Use Topics  . Smoking status: Light  Tobacco Smoker    Packs/day: 0.25    Years: 50.00    Types: Cigarettes  . Smokeless tobacco: Never Used     Comment: 4-5 cigarettes daily  . Alcohol use No    No Known Allergies  Current Outpatient Prescriptions  Medication Sig Dispense Refill  . buPROPion (WELLBUTRIN XL) 150 MG 24 hr tablet Take 150 mg by mouth daily.    . calcium acetate (PHOSLO) 667 MG capsule Take 667-2,001 mg by mouth daily. Pt takes three capsules three times daily with meals and one capsule two times daily with snacks.    . carboxymethylcellulose (REFRESH PLUS) 0.5 % SOLN Place 1 drop into both eyes at bedtime.    . cinacalcet (SENSIPAR) 30 MG tablet Take 30 mg by mouth daily.    . cloNIDine (CATAPRES) 0.2 MG tablet Take 1 tablet (0.2 mg total) by mouth 3 (three) times daily. 42 tablet 0  . doxazosin (CARDURA) 8 MG tablet Take 8 mg by mouth daily.     . fluorometholone (FML) 0.1 % ophthalmic suspension Place 1 drop into both eyes at bedtime.    . furosemide (LASIX) 40 MG tablet Take 40 mg by mouth 2 (two) times daily.     Marland Kitchen gabapentin (NEURONTIN)  100 MG capsule Take 100 mg by mouth 2 (two) times daily.    . Insulin Detemir (LEVEMIR FLEXTOUCH) 100 UNIT/ML Pen Inject 20 Units into the skin at bedtime.     . Linaclotide (LINZESS) 145 MCG CAPS capsule Take 1 capsule (145 mcg total) by mouth daily. Take 30 minutes before breakfast 30 capsule 11  . Nutritional Supplements (FEEDING SUPPLEMENT, NEPRO CARB STEADY,) LIQD Take 237 mLs by mouth 2 (two) times daily between meals. 2 Can 0  . omeprazole (PRILOSEC) 20 MG capsule Take 20 mg by mouth daily.     Marland Kitchen oxyCODONE (OXY IR/ROXICODONE) 5 MG immediate release tablet Take 5 mg by mouth every 6 (six) hours as needed for severe pain.     Marland Kitchen psyllium (METAMUCIL) 58.6 % powder Take 1 packet by mouth 3 (three) times daily as needed (for constipation).     . QUEtiapine (SEROQUEL) 100 MG tablet Take 200 mg by mouth at bedtime.     . saxagliptin HCl (ONGLYZA) 5 MG TABS tablet Take 5 mg  by mouth daily.     . sevelamer carbonate (RENVELA) 800 MG tablet Take 1,600 mg by mouth 3 (three) times daily with meals.     . simvastatin (ZOCOR) 20 MG tablet Take 20 mg by mouth at bedtime.      No current facility-administered medications for this visit.     REVIEW OF SYSTEMS:  [X]  denotes positive finding, [ ]  denotes negative finding Cardiac  Comments:  Chest pain or chest pressure:    Shortness of breath upon exertion:    Short of breath when lying flat:    Irregular heart rhythm:        Vascular    Pain in calf, thigh, or hip brought on by ambulation:    Pain in feet at night that wakes you up from your sleep:  x Left foot   Blood clot in your veins:    Leg swelling:         Pulmonary    Oxygen at home:    Productive cough:     Wheezing:         Neurologic    Sudden weakness in arms or legs:     Sudden numbness in arms or legs:     Sudden onset of difficulty speaking or slurred speech:    Temporary loss of vision in one eye:     Problems with dizziness:         Gastrointestinal    Blood in stool:     Vomited blood:         Genitourinary    Burning when urinating:     Blood in urine:        Psychiatric    Major depression:         Hematologic    Bleeding problems:    Problems with blood clotting too easily:        Skin    Rashes or ulcers:        Constitutional    Fever or chills:      PHYSICAL EXAM: Vitals:   02/06/17 1325  BP: (!) 144/78  Pulse: 86  Temp: 97.6 F (36.4 C)  SpO2: 98%  Weight: 142 lb 12.8 oz (64.8 kg)  Height: 5\' 9"  (1.753 m)    GENERAL: The patient is a well-nourished male, in no acute distress. The vital signs are documented above. HEENT: normocephalic, atraumatic. No abnormalities noted.  CARDIAC: There is a regular rate and rhythm.  No carotid bruits.  VASCULAR: 2+ brachial pulses bilaterally. Non palpable radial and ulnar pulses bilaterally. 2+ femoral pulses bilaterally. Non palpable left popliteal or pedal pulses.    PULMONARY: Decreased breath sounds bilaterally.  MUSCULOSKELETAL: Bilateral hand atrophy, right worse than left Right BKA NEUROLOGIC: Weakened grip strength bilaterally.  SKIN: onychomycosis left toes. No open wounds seen. Scaling of left leg.  PSYCHIATRIC: The patient has a normal affect.  DATA:  ABIs 02/04/17  R: BKA L: 0.66 with monophasic waveforms  MEDICAL ISSUES: Right arm digital and radial disease  Patient was referred to [place], Dr. Nicoletta Dress and underwent work-up. Was not recommended surgery. Continues to have pain and numbness with right hand with significant hand atrophy. No non healing wounds seen. Will refer to [facility] for revascularization as microsurgery will need to be involved.   Left lower extremity PAD Now having left foot rest pain. Plan for arteriogram with left leg runoff and possible intervention on 02/12/17 with Dr. Bridgett Larsson. On statin. Was previously on ASA but this was discontinued by one of his physicians.   Virgina Jock, PA-C [facility] MD: Bridgett Larsson  Addendum  I have independently interviewed and examined the patient, and I agree with the physician assistant's findings.  Right hand remains viable but his motor is attenuated and he has some clawing evident.  On his R arm angiogram, his radial artery occludes distally with perfusion via ulnar artery into a partial arch.  His arteries appears to be <2 mm in size.  I think a second opinion from [facility] is reasonable, as I don't have anything to offer as no one in my practice is trained in microsurgery.   Adele Barthel, MD, FACS [facility] [place] Office: [phone] Pager: [phone]  02/06/2017, 4:29 PM

## 2017-02-12 NOTE — Progress Notes (Addendum)
Site area: RFA Site Prior to Removal:  Level 0 Pressure Applied For:25 min Manual:  yes  Patient Status During Pull:  stable Post Pull Site:  Level 0 Post Pull Instructions Given:  yes Post Pull Pulses Present: popliteal Dressing Applied:  pressure Bedrest begins @ 1825 till 2225 Comments: removed by Suella Broad

## 2017-02-12 NOTE — Interval H&P Note (Signed)
   History and Physical Update  The patient was interviewed and re-examined.  The patient's previous History and Physical has been reviewed and is unchanged from my consult.  There is no change in the plan of care: aortogram, left leg runoff, and possible intervention.   I discussed with the patient the nature of angiographic procedures, especially the limited patencies of any endovascular intervention.    The patient is aware of that the risks of an angiographic procedure include but are not limited to: bleeding, infection, access site complications, renal failure, embolization, rupture of vessel, dissection, arteriovenous fistula, possible need for emergent surgical intervention, possible need for surgical procedures to treat the patient's pathology, anaphylactic reaction to contrast, and stroke and death.    The patient is aware of the risks and agrees to proceed.   Adele Barthel, MD, FACS [facility] [place] Office: [phone] Pager: [phone]  02/12/2017, 12:24 PM

## 2017-02-12 NOTE — Progress Notes (Addendum)
   Daily Progress Note   Discharge after heparin reversal likely to be 8-9 pm this evening.  Per pt's wife, his ride, she has to drive a family friend home and would prefer to keep him admitted overnight  - Will tenatively admit overnight per hospital policy - Discharge orders also in system if pt decides to refuse admission   Adele Barthel, MD, [place] [facility] [place] Office: [phone] Pager: [phone]  02/12/2017, 4:17 PM

## 2017-02-12 NOTE — Op Note (Signed)
OPERATIVE NOTE   PROCEDURE: 1.  Right common femoral artery cannulation under ultrasound guidance 2.  Placement of catheter in aorta 3.  Aortogram 4.  Second order arterial selection 5.  Left leg runoff via catheter 6.  Angioplasty of left superficial femoral artery (3 mm x 100 mm) 7.  Conscious sedation for 54 minutes  PRE-OPERATIVE DIAGNOSIS: left leg rest pain  POST-OPERATIVE DIAGNOSIS: same as above   SURGEON: Adele Barthel, MD  ANESTHESIA: conscious sedation  ESTIMATED BLOOD LOSS: 50 cc  CONTRAST: 170 cc  FINDING(S):  Diffuse calcification through aortic system  Aorta: patent  Superior mesenteric artery: patent   Right Left  RA Patent, patent accessory renal artery also present with sub-total occlusion, no nephrogram Patent, patent accessory renal artery, no nephrogram   CIA Patent stent Patent  EIA Patent Patent  IIA Occluded Occluded  CFA Patent, <50% stenosis Patent  SFA  Patent, diffusely diseased with several segments <2 mm: improved to 3 mm lumen throughout after serial angioplasty  PFA  Patent  Pop  Patent  Trif  Patent  AT  Patent  Pero  Patent  PT  Flush occlusion  Foot  Extensive digital artery disease, minimal to no perfusion of heel and posterior mid-foot visualized   SPECIMEN(S):  none  INDICATIONS:   Gerald Davenport is a 76 y.o. male who presents with known chronic peripheral arterial disease in left leg with extensive superficial femoral artery disease.  The patient has progressed into left leg rest pain.  The patient presents for: aortogram, left leg angiogram, and possible intervention on Left leg.  I discussed with the patient the nature of angiographic procedures, especially the limited patencies of any endovascular intervention.  The patient is aware of that the risks of an angiographic procedure include but are not limited to: bleeding, infection, access site complications, renal failure, embolization, rupture of vessel, dissection, possible  need for emergent surgical intervention, possible need for surgical procedures to treat the patient's pathology, and stroke and death.  The patient is aware of the risks and agrees to proceed.  DESCRIPTION: After full informed consent was obtained from the patient, the patient was brought back to the angiography suite.  The patient was placed supine upon the angiography table and connected to cardiopulmonary monitoring equipment.  The patient was then given conscious sedation, the amounts of which are documented in the patient's chart.  A circulating radiologic technician maintained continuous monitoring of the patient's cardiopulmonary status.  Additionally, the control room radiologic technician provided backup monitoring throughout the procedure.  The patient was prepped and drape in the standard fashion for an angiographic procedure.    At this point, attention was turned to the right groin.  Under ultrasound guidance, the subcutaneous tissue surrounding the right common femoral artery was anesthesized with 1% lidocaine with epinephrine.  The artery was then cannulated with a micropuncture needle.  The microwire was advanced into the iliac arterial system.  The needle was exchanged for a microsheath, which was loaded into the common femoral artery over the wire.  The microwire was exchanged for a Bentson wire which was advanced into the aorta.  The microsheath was then exchanged for a 5-Fr sheath which was loaded into the common femoral artery.  The Omniflush catheter was then loaded over the wire up to the level of L1.  The catheter was connected to the power injector circuit.  After de-airring and de-clotting the circuit, a power injector aortogram was completed.    Using  a Bentson wire and Omniflush catheter, the left common iliac artery was selected.  The catheter and wire were advanced into the external iliac artery.  The wire was removed and then the catheter connected to the power injector.  An  automated left leg runoff was completed.  The findings are listed above. The flow was surprisingly brisk so I suspect that his rest pain is due to serial stenoses in a small left superficial femoral artery and poor digital artery flow in the left foot.  I felt an attempt at improving inflow to the foot was indicated as this patient is looking a palliative amputation otherwise.  The wire was exchanged for a Rosen wire.  The patient's right femoral sheath was exchanged for a 6-Fr Destination sheath, which was lodged in the left superficial femoral artery.  The dilator was removed.  The patient was given 7000 units of Heparin intravenously, which was a therapeutic bolus.  I placed a Quickcross catheter over the wire and then exchanged the wire for a Glidewire.  Using this combination, I crossed the diffusely disease left superficial femoral artery.  I was able to get down to the popliteal artery with some difficulty.  The wire was obturating some segments of the superficial femoral artery, demonstrating <2 mm lumens in a segment proximally and distally in the superficial femoral artery.  I exchanged the wire for a Versacore wire.  I loaded a 3 mm x 100 mm balloon and inflated the balloon at 8 atm for 2 minutes throughout the entire length of this artery.  On completion injections, I could see improved luminal diameter throughout, even measuring 3 mm lumen at the most stenotic segments.  There was some hazy appearance to the distal superficial femoral artery stenosis without frank flow limiting dissection.  I felt further dilation would likely result in dissection, so I elected to stop further intervention.  I did a tibial injection to demonstrate continued tibial artery flow via peroneal and anterior tibial arteries without evidence of embolization.  I also did a lateral foot injection to confirm the limited blood flow in that foot.  The wire and catheter were removed.  I pulled the sheath back into the right  external iliac artery.  The sheath was aspirated.  No clots were present and the sheath was reloaded with heparinized saline.     COMPLICATIONS: none  CONDITION: stable   Adele Barthel, MD, [facility] [place] Office: [phone] Pager: [phone]  02/12/2017, 3:43 PM

## 2017-02-13 ENCOUNTER — Encounter (HOSPITAL_COMMUNITY): Payer: Self-pay | Admitting: Vascular Surgery

## 2017-02-13 DIAGNOSIS — I70222 Atherosclerosis of native arteries of extremities with rest pain, left leg: Secondary | ICD-10-CM | POA: Diagnosis not present

## 2017-02-13 LAB — GLUCOSE, CAPILLARY
GLUCOSE-CAPILLARY: 57 mg/dL — AB (ref 65–99)
Glucose-Capillary: 72 mg/dL (ref 65–99)

## 2017-02-13 MED ORDER — CLOPIDOGREL BISULFATE 75 MG PO TABS: 75.0000 mg | ORAL_TABLET | Freq: Every day | ORAL | 11 refills | Status: AC

## 2017-02-13 NOTE — Care Management Note (Addendum)
Case Management Note  Patient Details  Name: Gerald Davenport MRN: [id number] Date of Birth: 22-Nov-1940  Subjective/Objective:   From home with spouse,  s/p pv balloon angioplasty, for dc today.                 Action/Plan: NCM will follow for dc needs.  Expected Discharge Date:  02/13/17               Expected Discharge Plan:  Home/Self Care  In-House Referral:     Discharge planning Services     Post Acute Care Choice:    Choice offered to:     DME Arranged:    DME Agency:     HH Arranged:    HH Agency:     Status of Service:  Completed, signed off  If discussed at [employer] of Stay Meetings, dates discussed:    Additional Comments:  Zenon Mayo, RN 02/13/2017, 8:54 AM

## 2017-02-13 NOTE — Progress Notes (Signed)
Hypoglycemic Event  CBG: CBG=57 (05:57 am)  Treatment: 15 GM carbohydrate snack  Symptoms: None  Follow-up CBG: Time:06:12 am CBG Result:72  Possible Reasons for Event: Medication regimen  Comments/MD notified: Dr. Scot Dock

## 2017-02-13 NOTE — Care Management Obs Status (Signed)
[facility] NOTIFICATION   Patient Details  Name: Gerald Davenport MRN: [id number] Date of Birth: [birth year]   Medicare Observation Status Notification Given:  Yes    Zenon Mayo, RN 02/13/2017, 9:04 AM

## 2017-02-13 NOTE — Discharge Summary (Signed)
[facility] Discharge Summary   Patient ID:  Gerald Davenport MRN: [id number] DOB/AGE: [birth year] 78 y.o.  Admit date: 02/12/2017 Discharge date: 02/13/2017 Date of Surgery: 02/12/2017 Surgeon: Juliann Mule): Conrad Weston, MD  Admission Diagnosis: Atherosclerosis of native artery of left lower extremity with rest pain [facility]) [I70.222]  Discharge Diagnoses:  Atherosclerosis of native artery of left lower extremity with rest pain ([place]) [I70.222]  Secondary Diagnoses: Past Medical History:  Diagnosis Date  . Anemia   . Anxiety   . Cancer [facility])    Prostate cancer treated with radiation therapy  . Chronic kidney disease    dialysis Mon, Wednes, Fri  . Clot 05/2006   Arterial clot right leg   . Constipation   . Diabetes mellitus    Type II  . Diverticulosis   . GERD (gastroesophageal reflux disease)   . Hemodialysis patient ([place]) 6/13  . Hyperlipidemia   . Hypertension   . Infection    drainage from distal incision of left upper arm arteriovenous graft  . Positive PPD   . Shortness of breath dyspnea    with exertion, when he has too much fluid  . Steal syndrome dialysis vascular access ([place])   . Thyroid disease    hyperparathyroidism  . Wears glasses     Procedure(s): Abdominal Aortogram w/Lower Extremity Peripheral Vascular Balloon Angioplasty  Discharged Condition: good  HPI:  Gerald Davenport is a 76 y.o. male who presents for follow-up of his right arm digital and radial disease and left lower extremity PAD. The patient had bilateral upper extremity steal complications. At his last visit in January 2018, he was referred to [place] given progressive right hand pain and weakness. He underwent work-up but was not recommended surgery. He continues to have pain and numbness with his right hand. He also reports some mild pain with his left hand but this is tolerable.   He also reports pain in his left foot at night. He reports a wound on his  left ankle, but this has healed. 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 He continues to smoke.  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  ABIs 02/04/17  R: BKA L: 0.66 with monophasic waveforms  Now having left foot rest pain. Plan for arteriogram with left leg runoff and possible intervention on 02/12/17 with Dr. Bridgett Larsson. On statin.   Hospital Course:  Gerald Davenport is a 76 y.o. male is S/P  Procedure(s): Abdominal Aortogram w/Lower Extremity Peripheral Vascular Balloon Angioplasty  POD #1 s/p L SFA PTA   No obvious complications  Ok to D/C on Plavix 75 mg daily  Follow up in office in 4-6 weeks with L ABI  Significant Diagnostic Studies: CBC Lab Results  Component Value Date   WBC 9.2 02/12/2017   HGB 13.3 02/12/2017   HCT 41.1 02/12/2017   MCV 105.4 (H) 02/12/2017   PLT 176 02/12/2017    BMET    Component Value Date/Time   NA 139 02/12/2017 1141   K 5.1 02/12/2017 1141   CL 99 (L) 02/12/2017 1141   CO2 29 01/25/2017 0441   GLUCOSE 131 (H) 02/12/2017 1141   BUN 38 (H) 02/12/2017 1141   CREATININE 8.12 (H) 02/12/2017 1845   CREATININE 9.19 (H) 02/06/2015 1206   CALCIUM 8.6 (L) 01/25/2017 0441   GFRNONAA 6 (L) 02/12/2017 1845   GFRAA 6 (L) 02/12/2017 1845   COAG Lab Results  Component Value  Date   INR 1.01 06/27/2016   INR 1.03 10/13/2015     Disposition:  Discharge to :Home Discharge Instructions    Call MD for:  redness, tenderness, or signs of infection (pain, swelling, bleeding, redness, odor or green/yellow discharge around incision site)    Complete by:  As directed    Call MD for:  severe or increased pain, loss or decreased feeling  in affected limb(s)    Complete by:  As directed    Call MD for:  temperature >100.5    Complete by:  As directed    Discharge instructions    Complete by:  As directed    Remove dressing from groin tomorrow and shower daily as needed.   Driving Restrictions    Complete by:  As directed     No driving for 24 hours   Increase activity slowly    Complete by:  As directed    Walk with assistance use walker or cane as needed   Lifting restrictions    Complete by:  As directed    No heavy lifting for 2-3 weeks   Resume previous diet    Complete by:  As directed      Allergies as of 02/13/2017   No Known Allergies     Medication List    TAKE these medications   buPROPion 150 MG 24 hr tablet Commonly known as:  WELLBUTRIN XL Take 150 mg by mouth daily.   calcium acetate 667 MG capsule Commonly known as:  PHOSLO Take 667-2,001 mg by mouth 3 (three) times daily with meals.   carboxymethylcellulose 0.5 % Soln Commonly known as:  REFRESH PLUS Place 1 drop into both eyes at bedtime.   cinacalcet 30 MG tablet Commonly known as:  SENSIPAR Take 30 mg by mouth daily.   cloNIDine 0.2 MG tablet Commonly known as:  CATAPRES Take 1 tablet (0.2 mg total) by mouth 3 (three) times daily.   clopidogrel 75 MG tablet Commonly known as:  PLAVIX Take 1 tablet (75 mg total) by mouth daily.   doxazosin 8 MG tablet Commonly known as:  CARDURA Take 8 mg by mouth daily.   feeding supplement (NEPRO CARB STEADY) Liqd Take 237 mLs by mouth 2 (two) times daily between meals.   fluorometholone 0.1 % ophthalmic suspension Commonly known as:  FML Place 1 drop into both eyes at bedtime.   furosemide 40 MG tablet Commonly known as:  LASIX Take 40 mg by mouth 2 (two) times daily.   gabapentin 100 MG capsule Commonly known as:  NEURONTIN Take 100 mg by mouth 2 (two) times daily.   LEVEMIR FLEXTOUCH 100 UNIT/ML Pen Generic drug:  Insulin Detemir Inject 10 Units into the skin at bedtime.   linaclotide 145 MCG Caps capsule Commonly known as:  LINZESS Take 1 capsule (145 mcg total) by mouth daily. Take 30 minutes before breakfast   omeprazole 20 MG capsule Commonly known as:  PRILOSEC Take 20 mg by mouth daily.   ONGLYZA 5 MG Tabs tablet Generic drug:  saxagliptin HCl Take  5 mg by mouth daily.   oxyCODONE 5 MG immediate release tablet Commonly known as:  Oxy IR/ROXICODONE Take 5 mg by mouth every 6 (six) hours as needed for severe pain.   psyllium 58.6 % powder Commonly known as:  METAMUCIL Take 1 packet by mouth 3 (three) times daily as needed (for constipation).   QUEtiapine 100 MG tablet Commonly known as:  SEROQUEL Take 200 mg by mouth at  bedtime.   sevelamer carbonate 800 MG tablet Commonly known as:  RENVELA Take 800 mg by mouth 3 (three) times daily with meals.   simvastatin 20 MG tablet Commonly known as:  ZOCOR Take 20 mg by mouth at bedtime.      Verbal and written Discharge instructions given to the patient. Wound care per Discharge AVS Follow-up Information    Conrad Opheim, MD Follow up in 2 week(s).   Specialties:  Vascular Surgery, Cardiology Why:  office will call for appointment Contact information: [place] [address] [phone]           Signed: Laurence Slate [facility] 02/13/2017, 10:37 AM  Addendum  Pt underwent uneventful L SFA PTA.  He was admitted overnight due late time of sheath removal and inability for his wife to pick up late at night.  The patient had no bleeding completion from his procedure.  He will be seen in the office in 4-6 weeks.   Adele Barthel, MD, FACS [facility] [employer] Office: [phone] Pager: [phone]  02/13/2017, 11:49 AM

## 2017-02-13 NOTE — Progress Notes (Signed)
   Daily Progress Note   Assessment/Planning:   POD #1 s/p L SFA PTA   No obvious complications  Ok to D/C  Follow up in office in 4-6 weeks with L ABI   Subjective  - 1 Day Post-Op   No complaints, no events overnight   Objective   Vitals:   02/12/17 2005 02/13/17 0129 02/13/17 0400 02/13/17 0729  BP: (!) 176/60 (!) 161/74 (!) 155/48 (!) 155/46  Pulse: 99 86 89 89  Resp: 16 10 12  (!) 21  Temp:  98.2 F (36.8 C)  97.6 F (36.4 C)  TempSrc:  Axillary  Oral  SpO2: 100% 100% 100% 100%  Weight:  147 lb 14.9 oz (67.1 kg)    Height:         Intake/Output Summary (Last 24 hours) at 02/13/17 0801 Last data filed at 02/13/17 0732  Gross per 24 hour  Intake              120 ml  Output                0 ml  Net              120 ml     PULM  CTAB  CV  RRR  GI  soft, NTND  VASC R groin without hematoma or echymosis, no bruit  NEURO LLE DNVI, viable L foot    Laboratory CBC CBC Latest Ref Rng & Units 02/12/2017 02/12/2017 01/25/2017  WBC 4.0 - 10.5 K/uL 9.2 - 6.6  Hemoglobin 13.0 - 17.0 g/dL 13.3 16.0 10.1(L)  Hematocrit 39.0 - 52.0 % 41.1 47.0 31.2(L)  Platelets 150 - 400 K/uL 176 - 190     BMET    Component Value Date/Time   NA 139 02/12/2017 1141   K 5.1 02/12/2017 1141   CL 99 (L) 02/12/2017 1141   CO2 29 01/25/2017 0441   GLUCOSE 131 (H) 02/12/2017 1141   BUN 38 (H) 02/12/2017 1141   CREATININE 8.12 (H) 02/12/2017 1845   CREATININE 9.19 (H) 02/06/2015 1206   CALCIUM 8.6 (L) 01/25/2017 0441   GFRNONAA 6 (L) 02/12/2017 1845   GFRAA 6 (L) 02/12/2017 1845      Adele Barthel, MD, FACS [facility] [place] Office: [phone] Pager: [phone]  02/13/2017, 8:01 AM

## 2017-02-16 ENCOUNTER — Telehealth: Payer: Self-pay | Admitting: Vascular Surgery

## 2017-02-16 NOTE — Telephone Encounter (Signed)
Sched appt 03/13/17, lab at 3:00 and MD 3:30. Person who picked up the phone said we have the wrong number. Mailed appt letter.

## 2017-02-16 NOTE — Telephone Encounter (Signed)
-----   Message from Mena Goes, RN sent at 02/12/2017  4:04 PM EDT ----- Regarding: 4 weeks w/  left ABI   ----- Message ----- From: Conrad , MD Sent: 02/12/2017   4:02 PM To: [address]  Gerald Davenport [id number] Aug 18, 1941  PROCEDURE: 1.  Right common femoral artery cannulation under ultrasound guidance 2.  Placement of catheter in aorta 3.  Aortogram 4.  Second order arterial selection 5.  Left leg runoff via catheter 6.  Angioplasty of left superficial femoral artery (3 mm x 100 mm) 7.  Conscious sedation for 54 minutes  F/U: 4 weeks, L ABI

## 2017-03-02 ENCOUNTER — Encounter: Payer: Self-pay | Admitting: Vascular Surgery

## 2017-03-04 NOTE — Addendum Note (Signed)
Addended by: Lianne Cure A on: 03/04/2017 09:36 AM   Modules accepted: Orders

## 2017-03-05 ENCOUNTER — Encounter: Payer: Medicare Other | Admitting: Vascular Surgery

## 2017-03-05 ENCOUNTER — Other Ambulatory Visit (HOSPITAL_COMMUNITY): Payer: Medicare Other

## 2017-03-05 ENCOUNTER — Encounter (HOSPITAL_COMMUNITY): Payer: Medicare Other

## 2017-03-05 ENCOUNTER — Encounter: Payer: Self-pay | Admitting: Vascular Surgery

## 2017-03-05 NOTE — Progress Notes (Signed)
Postoperative Visit   History of Present Illness   Gerald Davenport is a 76 y.o. male who presents for postoperative follow-up from procedure on 02/12/17.    Procedure: 1.  PTA L SFA  (02/12/17) for rest pain.  The patient notes great improvement in L leg sx since procedure.  The patient is not able to complete their activities of daily living due to R >> L hand pain.  The patient's current symptoms are: R hand equivalent to Rest pain.  The patient has been seen at [facility].  Reportedly they have offer him some reconstruction procedure but have concerns of the success rate.  The patient is going to try to manage his pain non-operatively for now.  Past Medical History, Past Surgical History, Social History, Family History, Medications, Allergies, and Review of Systems are unchanged from previous evaluation on 02/12/17.  Current Outpatient Prescriptions  Medication Sig Dispense Refill  . buPROPion (WELLBUTRIN XL) 150 MG 24 hr tablet Take 150 mg by mouth daily.    . calcium acetate (PHOSLO) 667 MG capsule Take 667-2,001 mg by mouth 3 (three) times daily with meals.     . carboxymethylcellulose (REFRESH PLUS) 0.5 % SOLN Place 1 drop into both eyes at bedtime.    . cinacalcet (SENSIPAR) 30 MG tablet Take 30 mg by mouth daily.    . cloNIDine (CATAPRES) 0.2 MG tablet Take 1 tablet (0.2 mg total) by mouth 3 (three) times daily. 42 tablet 0  . clopidogrel (PLAVIX) 75 MG tablet Take 1 tablet (75 mg total) by mouth daily. 30 tablet 11  . doxazosin (CARDURA) 8 MG tablet Take 8 mg by mouth daily.     . fluorometholone (FML) 0.1 % ophthalmic suspension Place 1 drop into both eyes at bedtime.    . furosemide (LASIX) 40 MG tablet Take 40 mg by mouth 2 (two) times daily.     Marland Kitchen gabapentin (NEURONTIN) 100 MG capsule Take 100 mg by mouth 2 (two) times daily.    . Insulin Detemir (LEVEMIR FLEXTOUCH) 100 UNIT/ML Pen Inject 10 Units into the skin at bedtime.     . Linaclotide (LINZESS) 145 MCG CAPS capsule Take 1  capsule (145 mcg total) by mouth daily. Take 30 minutes before breakfast 30 capsule 11  . Nutritional Supplements (FEEDING SUPPLEMENT, NEPRO CARB STEADY,) LIQD Take 237 mLs by mouth 2 (two) times daily between meals. (Patient not taking: Reported on 02/12/2017) 2 Can 0  . omeprazole (PRILOSEC) 20 MG capsule Take 20 mg by mouth daily.     Marland Kitchen oxyCODONE (OXY IR/ROXICODONE) 5 MG immediate release tablet Take 5 mg by mouth every 6 (six) hours as needed for severe pain.     Marland Kitchen psyllium (METAMUCIL) 58.6 % powder Take 1 packet by mouth 3 (three) times daily as needed (for constipation).     . QUEtiapine (SEROQUEL) 100 MG tablet Take 200 mg by mouth at bedtime.     . saxagliptin HCl (ONGLYZA) 5 MG TABS tablet Take 5 mg by mouth daily.     . sevelamer carbonate (RENVELA) 800 MG tablet Take 800 mg by mouth 3 (three) times daily with meals.     . simvastatin (ZOCOR) 20 MG tablet Take 20 mg by mouth at bedtime.      Current Facility-Administered Medications  Medication Dose Route Frequency Provider Last Rate Last Dose  . labetalol (NORMODYNE,TRANDATE) injection 10 mg  10 mg Intravenous Q2H PRN Conrad [place], MD        ROS:  no ulcers, resolution of L leg rest pain   For VQI Use Only   PRE-ADM LIVING: Home  AMB STATUS: Ambulatory   Physical Examination   Vitals:   03/13/17 1601  BP: (!) 148/70  Pulse: 78  Resp: 16  Temp: (!) 96.8 F (36 C)  TempSrc: Oral  SpO2: 98%  Weight: 147 lb (66.7 kg)  Height: 5\' 9"  (1.753 m)    Body mass index is 21.71 kg/m.  General Alert, O x 3, WD, NAD  Pulmonary Sym exp, good B air movt, CTA B  Cardiac RRR, Nl S1, S2, no Murmurs, No rubs, No S3,S4  Vascular Vessel Right Left  Radial Palpable Palpable  Brachial Palpable Palpable  Carotid Palpable, No Bruit Palpable, No Bruit  Aorta Not palpable N/A  Femoral Palpable Palpable  Popliteal Not palpable Not palpable  PT BKA Not palpable  DP BKA Faintly palpable    Gastrointestinal soft, non-distended,  non-tender to palpation, No guarding or rebound, no HSM, no masses, no CVAT B, No palpable prominent aortic pulse,    Musculoskeletal M/S 5/5 throughout  , Extremities without ischemic changes except B hand test limited due to pain, No edema present, B thenar atrophy evident  Neurologic  Pain and light touch intact in extremities , Motor exam as listed above   Non-invasive Vascular Imaging     L ABI (03/13/2017 )  L:   ABI: 0.87 (0.66),   PT: mono  DP: mono  TBI: 0.39   Medical Decision Making   Gerald Davenport is a 76 y.o. 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 I discussed in depth with the patient the nature of atherosclerosis, and emphasized the importance of maximal medical management including strict control of blood pressure, blood glucose, and lipid levels, obtaining regular exercise, and cessation of smoking.  The patient is aware that without maximal medical management the underlying atherosclerotic disease process will progress, limiting the benefit of any interventions. The patient is currently on a statin: Zocor.  The patient is currently on an anti-platelet: Plavix.  I am also referring the patient to Pain Clinic for multi-modality therapy.  Unfortunately, the ulnar flow in the R hand does not appear to be adequate to alleviate this patient's sx, hence the refer to a Hand microsurgeon for possible revascularization.  Thank you for allowing [place] to participate in this patient's care.   Gerald Barthel, MD, FACS [facility] [place] Office: [phone] Pager: [phone]

## 2017-03-13 ENCOUNTER — Ambulatory Visit (INDEPENDENT_AMBULATORY_CARE_PROVIDER_SITE_OTHER): Payer: Medicare Other | Admitting: Vascular Surgery

## 2017-03-13 ENCOUNTER — Encounter: Payer: Self-pay | Admitting: Vascular Surgery

## 2017-03-13 ENCOUNTER — Ambulatory Visit (HOSPITAL_COMMUNITY)
Admit: 2017-03-13 | Discharge: 2017-03-13 | Disposition: A | Discharge status: Home / Self Care | Payer: Medicare Other | Attending: Vascular Surgery | Admitting: Vascular Surgery

## 2017-03-13 VITALS — BP 148/70 | HR 78 | Temp 96.8°F | Resp 16 | Ht 69.0 in | Wt 147.0 lb

## 2017-03-13 DIAGNOSIS — I70222 Atherosclerosis of native arteries of extremities with rest pain, left leg: Secondary | ICD-10-CM

## 2017-03-20 NOTE — Addendum Note (Signed)
Addended by: Lianne Cure A on: 03/20/2017 02:47 PM   Modules accepted: Orders

## 2017-04-17 ENCOUNTER — Telehealth: Payer: Self-pay | Admitting: Orthotics

## 2017-04-21 ENCOUNTER — Telehealth: Payer: Self-pay | Admitting: Podiatry

## 2017-04-21 NOTE — Telephone Encounter (Signed)
Yes, I called one day last week and left a message in regards to my dad's diabetic shoes and have not heard anything. I told the pt per telephone message from [place] that his doctor has not signed off on the paperwork yet and until he/she does we cannot move forward. Pt's daughter asked who her father's doctor is that follows his diabetes. I told her I did not know. She stated she would ask her dad and contact that doctors office. Pt's daughter called right back saying her mom stated that her dad was already measured for his shoes. Requested a call back to pt or his wife Gerald Davenport at [phone].

## 2017-04-22 NOTE — Telephone Encounter (Signed)
PCP still hasn't signed off..I called and talked to [place]. She said she will come by [facility] to p/u paperwork to take to Dr

## 2017-05-01 ENCOUNTER — Encounter: Payer: Self-pay | Admitting: Family

## 2017-05-04 ENCOUNTER — Ambulatory Visit (INDEPENDENT_AMBULATORY_CARE_PROVIDER_SITE_OTHER): Payer: Medicare Other | Admitting: Family

## 2017-05-04 ENCOUNTER — Encounter: Payer: Self-pay | Admitting: Family

## 2017-05-04 VITALS — BP 126/61 | HR 79 | Temp 96.9°F | Resp 18 | Ht 69.0 in | Wt 144.0 lb

## 2017-05-04 DIAGNOSIS — Z992 Dependence on renal dialysis: Secondary | ICD-10-CM | POA: Diagnosis not present

## 2017-05-04 DIAGNOSIS — Z89511 Acquired absence of right leg below knee: Secondary | ICD-10-CM

## 2017-05-04 DIAGNOSIS — T8789 Other complications of amputation stump: Secondary | ICD-10-CM

## 2017-05-04 DIAGNOSIS — N186 End stage renal disease: Secondary | ICD-10-CM | POA: Diagnosis not present

## 2017-05-04 DIAGNOSIS — G8918 Other acute postprocedural pain: Secondary | ICD-10-CM

## 2017-05-04 DIAGNOSIS — I779 Disorder of arteries and arterioles, unspecified: Secondary | ICD-10-CM

## 2017-05-04 DIAGNOSIS — M79604 Pain in right leg: Principal | ICD-10-CM

## 2017-05-04 NOTE — Progress Notes (Signed)
[facility] [place]   CC: Painful, ill-fitting right BKA prosthesis, worsening over 6-8 months, 40 pounds weight loss since 2013  History of Present Illness Gerald Davenport is a 76 y.o. male who is s/p PTA L SFA  (02/12/17) for rest pain.  The patient notes great improvement in L leg sx since procedure.  The patient is not able to complete their activities of daily living due to R >> L hand pain.  The patient's current symptoms are: R hand equivalent to Rest pain.  The patient has been seen at [facility].  Reportedly they have offer him some reconstruction procedure but have concerns of the success rate.    The pt was last evaluated by Dr. Bridgett Larsson on 03-13-17. At that time, based on his angiographic findings, this patient needs: q3 month ABI and LLE arterial duplex.. Dr. Bridgett Larsson referred the patient to Pain Clinic for multi-modality therapy.  Unfortunately, the ulnar flow in the R hand does not appear to be adequate to alleviate this patient's sx, hence the refer to a Hand microsurgeon for possible revascularization.  The patient returns today at the request of his nephrologist, for worsening pain in right BKA stump for 6-8 months with walking and putting pressure on the stump with his BKA prosthesis; he reports signficant weight loss since starting HD. He denies open sores on his right BKA stump. He had been working with [employer] for his right BKA prosthesis.   Pt states he shaved a callus on his left heel, and wears a protective dressing over this. He states his podiatrist has also shaved this callus.   Wife states pt has been losing weight ever since he was diagnosed with DM in the 1980's. He states he lost the most weight when he started HD in 2013. Pt states he has lost about 40 pounds since 2013.   Pt dialyzes M-W-F via right upper chest catheter. He has had accesses in both upper arms, states he was told that the next permanent access would be his left thigh.   He denies any known  history of stroke or TIA.   Pt Diabetic: Yes, 5.1 A1C in January 2018 Pt smoker: smoker  (5 cigarettes/day, started about age 57 yrs)  Pt meds include: Statin :Yes Betablocker: No ASA: No Other anticoagulants/antiplatelets: Plavix   Past Medical History:  Diagnosis Date  . Anemia   . Anxiety   . Cancer [facility])    Prostate cancer treated with radiation therapy  . Chronic kidney disease    dialysis Mon, Wednes, Fri  . Clot 05/2006   Arterial clot right leg   . Constipation   . Diabetes mellitus    Type II  . Diverticulosis   . GERD (gastroesophageal reflux disease)   . Hemodialysis patient ([place]) 6/13  . Hyperlipidemia   . Hypertension   . Infection    drainage from distal incision of left upper arm arteriovenous graft  . Positive PPD   . Shortness of breath dyspnea    with exertion, when he has too much fluid  . Steal syndrome dialysis vascular access ([place])   . Thyroid disease    hyperparathyroidism  . Wears glasses     Social History Social History  Substance Use Topics  . Smoking status: Light Tobacco Smoker    Packs/day: 0.25    Years: 50.00    Types: Cigarettes  . Smokeless tobacco: Never Used     Comment: 4-5 cigarettes daily  . Alcohol use No  Family History Family History  Problem Relation Age of Onset  . Heart disease Mother   . Diabetes Mother   . Hypertension Mother   . Stroke Mother   . Heart disease Father   . Diabetes Father   . Heart disease Sister   . Diabetes Brother   . Colon cancer Neg Hx     Past Surgical History:  Procedure Laterality Date  . ABDOMINAL AORTOGRAM W/LOWER EXTREMITY N/A 02/12/2017   Procedure: Abdominal Aortogram w/Lower Extremity;  Surgeon: Conrad Arjay, MD;  Location: [place] [facility];  Service: Cardiovascular;  Laterality: N/A;  . AV FISTULA PLACEMENT  11/04/2010   left and left  . AV FISTULA PLACEMENT  11/20/2011   Procedure: INSERTION OF ARTERIOVENOUS (AV) GORE-TEX GRAFT ARM;  Surgeon: Angelia Mould,  MD;  Location: [place];  Service: Vascular;  Laterality: Right;  . AV FISTULA PLACEMENT Right 04/19/2015   Procedure: Excision of infected RUA AVG;  Surgeon: Conrad [place], MD;  Location: [place];  Service: Vascular;  Laterality: Right;  . AV FISTULA PLACEMENT Left 10/04/2015   Procedure: INSERTION OF ARTERIOVENOUS (AV) GORE-TEX GRAFT ARM;  Surgeon: Conrad Ar[place], MD;  Location: [place];  Service: Vascular;  Laterality: Left;  . [place] REMOVAL Left 10/13/2015   Procedure: REMOVAL OF INFECTED ARTERIOVENOUS GORETEX LEFT GRAFT ;  Surgeon: Conrad [place], MD;  Location: [place];  Service: Vascular;  Laterality: Left;  . BASCILIC VEIN TRANSPOSITION Left 06/19/2015   Procedure: FIRST STAGE BRACHIAL VEIN TRANSPOSITION;  Surgeon: Conrad [place], MD;  Location: [place];  Service: Vascular;  Laterality: Left;  . 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 COLONOSCOPY N/A 11/10/2012   WUJ:[id number] proctitis/Pancolonic diverticulosis/(TUBULAR ADENOMA)5 mm polyp in the transverse segment/4 mm polyps in the base of the cecum and diminutive polyp in the descending segment. INADEQUATE BOWEL PREP  . COLONOSCOPY N/A 11/28/2013   RMR: Radiation proctitis. Colonic diverticulosis. Colonic polyps-removed as described above.   . INSERTION OF DIALYSIS CATHETER Right 01/19/2016   Procedure: INSERTION OF Right internal jugular  DIALYSIS CATHETER.;  Surgeon: Elam Dutch, MD;  Location: [place];  Service: Vascular;  Laterality: Right;  . PERIPHERAL VASCULAR BALLOON ANGIOPLASTY Left 02/12/2017   Procedure: Peripheral Vascular Balloon Angioplasty;  Surgeon: Conrad [place], MD;  Location: [place] [facility];  Service: Cardiovascular;  Laterality: Left;  SFA  . PERIPHERAL VASCULAR CATHETERIZATION Bilateral 12/06/2015   Procedure: Upper Extremity Venography/ central venogram;  Surgeon: Conrad [place], MD;  Location: [place] [facility];  Service: Cardiovascular;  Laterality: Bilateral;  . PERIPHERAL VASCULAR CATHETERIZATION N/A 05/29/2016    Procedure: Upper Extremity Angiography;  Surgeon: Conrad [place], MD;  Location: [place] [facility];  Service: Cardiovascular;  Laterality: N/A;  . PERIPHERAL VASCULAR CATHETERIZATION N/A 05/29/2016   Procedure: Lower Extremity Angiography;  Surgeon: Conrad De Motte, MD;  Location: [place] [facility];  Service: Cardiovascular;  Laterality: N/A;  . PERIPHERAL VASCULAR CATHETERIZATION  05/29/2016   Procedure: Peripheral Vascular Intervention;  Surgeon: Conrad [place], MD;  Location: [place] [facility];  Service: Cardiovascular;;  . REMOVAL OF A HERO DEVICE Right 01/19/2016   Procedure: REMOVAL OF A HERO DEVICE;  Surgeon: Elam Dutch, MD;  Location: [place];  Service: Vascular;  Laterality: Right;  . SHUNTOGRAM N/A 10/08/2012   Procedure: [name];  Surgeon: Elam Dutch, MD;  Location: [facility];  Service: Cardiovascular;  Laterality: N/A;  . VASCULAR ACCESS DEVICE INSERTION  Right 01/01/2016   Procedure: INSERTION OF HERO VASCULAR ACCESS DEVICE - RIGHT;  Surgeon: Conrad [place], MD;  Location: [place];  Service: Vascular;  Laterality: Right;    No Known Allergies  Current Outpatient Prescriptions  Medication Sig Dispense Refill  . buPROPion (WELLBUTRIN XL) 150 MG 24 hr tablet Take 150 mg by mouth daily.    . calcium acetate (PHOSLO) 667 MG capsule Take 667-2,001 mg by mouth 3 (three) times daily with meals.     . carboxymethylcellulose (REFRESH PLUS) 0.5 % SOLN Place 1 drop into both eyes at bedtime.    . cinacalcet (SENSIPAR) 30 MG tablet Take 30 mg by mouth daily.    . cloNIDine (CATAPRES) 0.2 MG tablet Take 1 tablet (0.2 mg total) by mouth 3 (three) times daily. 42 tablet 0  . clopidogrel (PLAVIX) 75 MG tablet Take 1 tablet (75 mg total) by mouth daily. 30 tablet 11  . doxazosin (CARDURA) 8 MG tablet Take 8 mg by mouth daily.     . fluorometholone (FML) 0.1 % ophthalmic suspension Place 1 drop into both eyes at bedtime.    . furosemide (LASIX) 40 MG tablet Take 40 mg by mouth 2 (two) times daily.      Marland Kitchen gabapentin (NEURONTIN) 100 MG capsule Take 100 mg by mouth 2 (two) times daily.    . Insulin Detemir (LEVEMIR FLEXTOUCH) 100 UNIT/ML Pen Inject 10 Units into the skin at bedtime.     . Linaclotide (LINZESS) 145 MCG CAPS capsule Take 1 capsule (145 mcg total) by mouth daily. Take 30 minutes before breakfast 30 capsule 11  . Nutritional Supplements (FEEDING SUPPLEMENT, NEPRO CARB STEADY,) LIQD Take 237 mLs by mouth 2 (two) times daily between meals. 2 Can 0  . omeprazole (PRILOSEC) 20 MG capsule Take 20 mg by mouth daily.     Marland Kitchen oxyCODONE (OXY IR/ROXICODONE) 5 MG immediate release tablet Take 5 mg by mouth every 6 (six) hours as needed for severe pain.     Marland Kitchen psyllium (METAMUCIL) 58.6 % powder Take 1 packet by mouth 3 (three) times daily as needed (for constipation).     . QUEtiapine (SEROQUEL) 100 MG tablet Take 200 mg by mouth at bedtime.     . saxagliptin HCl (ONGLYZA) 5 MG TABS tablet Take 5 mg by mouth daily.     . sevelamer carbonate (RENVELA) 800 MG tablet Take 800 mg by mouth 3 (three) times daily with meals.     . simvastatin (ZOCOR) 20 MG tablet Take 20 mg by mouth at bedtime.      Current Facility-Administered Medications  Medication Dose Route Frequency Provider Last Rate Last Dose  . labetalol (NORMODYNE,TRANDATE) injection 10 mg  10 mg Intravenous Q2H PRN Conrad Sparks, MD        ROS: See HPI for pertinent positives and negatives.   Physical Examination  Vitals:   05/04/17 1330  BP: 126/61  Pulse: 79  Resp: 18  Temp: (!) 96.9 F (36.1 C)  TempSrc: Oral  SpO2: 100%  Weight: 144 lb (65.3 kg)  Height: 5\' 9"  (1.753 m)   Body mass index is 21.27 kg/m.  General: A&O x 3, thin male. Gait: not observed, in w/c Eyes: PERRLA. Pulmonary: Respirations are non labored, faint rales in bilateral bases, no rhonchi or wheezes. Cardiac: regular rhythm and rate, no detected murmur.         Carotid Bruits Right Left   Negative Negative   Radial pulses are not palpable  bilaterally, brachial  pulses are palpable bilaterally  Adominal aortic pulse is not palpable                         VASCULAR EXAM: Extremities without ischemic changes,  without Gangrene; without open wounds. Shaved callus of plantar aspect of left heel. No open wounds of right BKA stump, but a callus has formed at the distal/medial/anterior aspect of his stump.                                                                                                           LE Pulses Right Left       FEMORAL  2+ palpable  2+ palpable        POPLITEAL  not palpable   not palpable       POSTERIOR TIBIAL BKA   not palpable        DORSALIS PEDIS      ANTERIOR TIBIAL BKA  not palpable    Abdomen: soft, NT, no palpable masses. Skin: no rashes, see Extremities.  Musculoskeletal: Mild generalized muscle wasting or atrophy.  Neurologic: A&O X 3; Appropriate Affect ; SENSATION: normal; MOTOR FUNCTION:  moving all extremities equally, motor strength 4/5 throughout. Speech is garbled/dificult to understand. CN 2-12 intact.    ASSESSMENT: Gerald Davenport is a 76 y.o. male who presents with painful, ill-fitting right BKA prosthesis, worsening over the last 6-8 months, callus formed at stump where prosthesis rubs, no open wounds on right AKA stump.  Pt is s/p PTA L SFA  (02/12/17) for rest pain. He is also s/p right BKA in 2004.  He has ESRD on HD.  He continues to smoke.  He sees a podiatrist.   DATA  ABI (Date: 03-13-17)  R: BKA  L:   ABI: 0.87 (was 0.66 on 02-04-17),   PT: mono  DP: mono  TBI: 0.39   PLAN:  Prescription to pt and wife for [employer], to refit right BKA prosthesis to prevent abrasion of left BKA, callus has formed; pt has lost about 40 pound since 2013, which may have been the primary factor affecting the fit of the prosthesis.   The patient was counseled re smoking cessation and given several free resources re smoking cessation.   Based on the patient's vascular studies  and examination, pt will return to clinic on 06-26-17 with left ABI and arterial duplex, see Dr. Bridgett Larsson, as scheduled.   I discussed in depth with the patient the nature of atherosclerosis, and emphasized the importance of maximal medical management including strict control of blood pressure, blood glucose, and lipid levels, obtaining regular exercise, and cessation of smoking.  The patient is aware that without maximal medical management the underlying atherosclerotic disease process will progress, limiting the benefit of any interventions.  The patient was given information about PAD including signs, symptoms, treatment, what symptoms should prompt the patient to seek immediate medical care, and risk reduction measures to take.  Clemon Chambers, RN, MSN, FNP-C [facility] Phone: [phone]  Clinic MD: Trula Slade  05/04/17 1:40 PM

## 2017-05-04 NOTE — Patient Instructions (Signed)
Steps to Quit Smoking Smoking tobacco can be bad for your health. It can also affect almost every organ in your body. Smoking puts you and people around you at risk for many serious long-lasting (chronic) diseases. Quitting smoking is hard, but it is one of the best things that you can do for your health. It is never too late to quit. What are the benefits of quitting smoking? When you quit smoking, you lower your risk for getting serious diseases and conditions. They can include:  Lung cancer or lung disease.  Heart disease.  Stroke.  Heart attack.  Not being able to have children (infertility).  Weak bones (osteoporosis) and broken bones (fractures).  If you have coughing, wheezing, and shortness of breath, those symptoms may get better when you quit. You may also get sick less often. If you are pregnant, quitting smoking can help to lower your chances of having a baby of low birth weight. What can I do to help me quit smoking? Talk with your doctor about what can help you quit smoking. Some things you can do (strategies) include:  Quitting smoking totally, instead of slowly cutting back how much you smoke over a period of time.  Going to in-person counseling. You are more likely to quit if you go to many counseling sessions.  Using resources and support systems, such as: ? Online chats with a counselor. ? Phone quitlines. ? Printed self-help materials. ? Support groups or group counseling. ? Text messaging programs. ? Mobile phone apps or applications.  Taking medicines. Some of these medicines may have nicotine in them. If you are pregnant or breastfeeding, do not take any medicines to quit smoking unless your doctor says it is okay. Talk with your doctor about counseling or other things that can help you.  Talk with your doctor about using more than one strategy at the same time, such as taking medicines while you are also going to in-person counseling. This can help make  quitting easier. What things can I do to make it easier to quit? Quitting smoking might feel very hard at first, but there is a lot that you can do to make it easier. Take these steps:  Talk to your family and friends. Ask them to support and encourage you.  Call phone quitlines, reach out to support groups, or work with a counselor.  Ask people who smoke to not smoke around you.  Avoid places that make you want (trigger) to smoke, such as: ? Bars. ? Parties. ? Smoke-break areas at work.  Spend time with people who do not smoke.  Lower the stress in your life. Stress can make you want to smoke. Try these things to help your stress: ? Getting regular exercise. ? Deep-breathing exercises. ? Yoga. ? Meditating. ? Doing a body scan. To do this, close your eyes, focus on one area of your body at a time from head to toe, and notice which parts of your body are tense. Try to relax the muscles in those areas.  Download or buy apps on your mobile phone or tablet that can help you stick to your quit plan. There are many free apps, such as QuitGuide from the CDC (Centers for Disease Control and Prevention). You can find more support from smokefree.gov and other websites.  This information is not intended to replace advice given to you by your health care provider. Make sure you discuss any questions you have with your health care provider. Document Released: 07/12/2009 Document   Revised: 05/13/2016 Document Reviewed: 01/30/2015 Elsevier Interactive Patient Education  2018 Elsevier Inc.     Peripheral Vascular Disease Peripheral vascular disease (PVD) is a disease of the blood vessels that are not part of your heart and brain. A simple term for PVD is poor circulation. In most cases, PVD narrows the blood vessels that carry blood from your heart to the rest of your body. This can result in a decreased supply of blood to your arms, legs, and internal organs, like your stomach or kidneys.  However, it most often affects a person's lower legs and feet. There are two types of PVD.  Organic PVD. This is the more common type. It is caused by damage to the structure of blood vessels.  Functional PVD. This is caused by conditions that make blood vessels contract and tighten (spasm).  Without treatment, PVD tends to get worse over time. PVD can also lead to acute ischemic limb. This is when an arm or limb suddenly has trouble getting enough blood. This is a medical emergency. Follow these instructions at home:  Take medicines only as told by your doctor.  Do not use any tobacco products, including cigarettes, chewing tobacco, or electronic cigarettes. If you need help quitting, ask your doctor.  Lose weight if you are overweight, and maintain a healthy weight as told by your doctor.  Eat a diet that is low in fat and cholesterol. If you need help, ask your doctor.  Exercise regularly. Ask your doctor for some good activities for you.  Take good care of your feet. ? Wear comfortable shoes that fit well. ? Check your feet often for any cuts or sores. Contact a doctor if:  You have cramps in your legs while walking.  You have leg pain when you are at rest.  You have coldness in a leg or foot.  Your skin changes.  You are unable to get or have an erection (erectile dysfunction).  You have cuts or sores on your feet that are not healing. Get help right away if:  Your arm or leg turns cold and blue.  Your arms or legs become red, warm, swollen, painful, or numb.  You have chest pain or trouble breathing.  You suddenly have weakness in your face, arm, or leg.  You become very confused or you cannot speak.  You suddenly have a very bad headache.  You suddenly cannot see. This information is not intended to replace advice given to you by your health care provider. Make sure you discuss any questions you have with your health care provider. Document Released:  12/10/2009 Document Revised: 02/21/2016 Document Reviewed: 02/23/2014 Elsevier Interactive Patient Education  2017 Elsevier Inc.  

## 2017-05-13 ENCOUNTER — Telehealth: Payer: Self-pay | Admitting: Vascular Surgery

## 2017-05-13 NOTE — Telephone Encounter (Signed)
Per instructions from [place] , I spoke w/ Thurmond Butts @ [facility] and they have accepted the patient.  They LVM for him to call to schedule an appt on 05/07/17. I spoke w/ the patient's wife today and gave her the phone [phone] and the address [address] and asked her or her husband (the pt) to call directly to schedule an appt. awt

## 2017-06-07 ENCOUNTER — Inpatient Hospital Stay (HOSPITAL_COMMUNITY)
Admission: EM | Admit: 2017-06-07 | Discharge: 2017-06-29 | DRG: 246 | Disposition: E | Payer: Medicare Other | Attending: Internal Medicine | Admitting: Internal Medicine

## 2017-06-07 ENCOUNTER — Emergency Department (HOSPITAL_COMMUNITY): Payer: Medicare Other

## 2017-06-07 ENCOUNTER — Encounter (HOSPITAL_COMMUNITY): Payer: Self-pay | Admitting: *Deleted

## 2017-06-07 DIAGNOSIS — I2510 Atherosclerotic heart disease of native coronary artery without angina pectoris: Secondary | ICD-10-CM | POA: Diagnosis present

## 2017-06-07 DIAGNOSIS — D72829 Elevated white blood cell count, unspecified: Secondary | ICD-10-CM | POA: Diagnosis not present

## 2017-06-07 DIAGNOSIS — Z923 Personal history of irradiation: Secondary | ICD-10-CM | POA: Diagnosis not present

## 2017-06-07 DIAGNOSIS — Z992 Dependence on renal dialysis: Secondary | ICD-10-CM | POA: Diagnosis not present

## 2017-06-07 DIAGNOSIS — F17210 Nicotine dependence, cigarettes, uncomplicated: Secondary | ICD-10-CM | POA: Diagnosis present

## 2017-06-07 DIAGNOSIS — I469 Cardiac arrest, cause unspecified: Secondary | ICD-10-CM | POA: Diagnosis not present

## 2017-06-07 DIAGNOSIS — J9601 Acute respiratory failure with hypoxia: Secondary | ICD-10-CM | POA: Diagnosis not present

## 2017-06-07 DIAGNOSIS — I214 Non-ST elevation (NSTEMI) myocardial infarction: Secondary | ICD-10-CM | POA: Diagnosis not present

## 2017-06-07 DIAGNOSIS — Z7902 Long term (current) use of antithrombotics/antiplatelets: Secondary | ICD-10-CM | POA: Diagnosis not present

## 2017-06-07 DIAGNOSIS — Z89511 Acquired absence of right leg below knee: Secondary | ICD-10-CM | POA: Diagnosis not present

## 2017-06-07 DIAGNOSIS — N2581 Secondary hyperparathyroidism of renal origin: Secondary | ICD-10-CM | POA: Diagnosis present

## 2017-06-07 DIAGNOSIS — I160 Hypertensive urgency: Secondary | ICD-10-CM

## 2017-06-07 DIAGNOSIS — I10 Essential (primary) hypertension: Secondary | ICD-10-CM | POA: Diagnosis not present

## 2017-06-07 DIAGNOSIS — E1122 Type 2 diabetes mellitus with diabetic chronic kidney disease: Secondary | ICD-10-CM | POA: Diagnosis present

## 2017-06-07 DIAGNOSIS — R402434 Glasgow coma scale score 3-8, 24 hours or more after hospital admission: Secondary | ICD-10-CM | POA: Diagnosis not present

## 2017-06-07 DIAGNOSIS — E1151 Type 2 diabetes mellitus with diabetic peripheral angiopathy without gangrene: Secondary | ICD-10-CM | POA: Diagnosis present

## 2017-06-07 DIAGNOSIS — I519 Heart disease, unspecified: Secondary | ICD-10-CM | POA: Diagnosis not present

## 2017-06-07 DIAGNOSIS — I120 Hypertensive chronic kidney disease with stage 5 chronic kidney disease or end stage renal disease: Secondary | ICD-10-CM | POA: Diagnosis not present

## 2017-06-07 DIAGNOSIS — G9340 Encephalopathy, unspecified: Secondary | ICD-10-CM | POA: Diagnosis not present

## 2017-06-07 DIAGNOSIS — G931 Anoxic brain damage, not elsewhere classified: Secondary | ICD-10-CM | POA: Diagnosis not present

## 2017-06-07 DIAGNOSIS — Z720 Tobacco use: Secondary | ICD-10-CM

## 2017-06-07 DIAGNOSIS — Z8546 Personal history of malignant neoplasm of prostate: Secondary | ICD-10-CM

## 2017-06-07 DIAGNOSIS — Z66 Do not resuscitate: Secondary | ICD-10-CM | POA: Diagnosis not present

## 2017-06-07 DIAGNOSIS — E785 Hyperlipidemia, unspecified: Secondary | ICD-10-CM | POA: Diagnosis present

## 2017-06-07 DIAGNOSIS — I200 Unstable angina: Secondary | ICD-10-CM | POA: Diagnosis not present

## 2017-06-07 DIAGNOSIS — R748 Abnormal levels of other serum enzymes: Secondary | ICD-10-CM | POA: Diagnosis not present

## 2017-06-07 DIAGNOSIS — R001 Bradycardia, unspecified: Secondary | ICD-10-CM | POA: Diagnosis not present

## 2017-06-07 DIAGNOSIS — J9690 Respiratory failure, unspecified, unspecified whether with hypoxia or hypercapnia: Secondary | ICD-10-CM

## 2017-06-07 DIAGNOSIS — J9600 Acute respiratory failure, unspecified whether with hypoxia or hypercapnia: Secondary | ICD-10-CM

## 2017-06-07 DIAGNOSIS — I161 Hypertensive emergency: Secondary | ICD-10-CM | POA: Diagnosis not present

## 2017-06-07 DIAGNOSIS — Z833 Family history of diabetes mellitus: Secondary | ICD-10-CM | POA: Diagnosis not present

## 2017-06-07 DIAGNOSIS — N186 End stage renal disease: Secondary | ICD-10-CM | POA: Diagnosis not present

## 2017-06-07 DIAGNOSIS — Z823 Family history of stroke: Secondary | ICD-10-CM

## 2017-06-07 DIAGNOSIS — G936 Cerebral edema: Secondary | ICD-10-CM | POA: Diagnosis not present

## 2017-06-07 DIAGNOSIS — E11649 Type 2 diabetes mellitus with hypoglycemia without coma: Secondary | ICD-10-CM | POA: Diagnosis present

## 2017-06-07 DIAGNOSIS — F329 Major depressive disorder, single episode, unspecified: Secondary | ICD-10-CM | POA: Diagnosis present

## 2017-06-07 DIAGNOSIS — D638 Anemia in other chronic diseases classified elsewhere: Secondary | ICD-10-CM | POA: Diagnosis present

## 2017-06-07 DIAGNOSIS — I5030 Unspecified diastolic (congestive) heart failure: Secondary | ICD-10-CM | POA: Diagnosis not present

## 2017-06-07 DIAGNOSIS — K219 Gastro-esophageal reflux disease without esophagitis: Secondary | ICD-10-CM

## 2017-06-07 DIAGNOSIS — Z8249 Family history of ischemic heart disease and other diseases of the circulatory system: Secondary | ICD-10-CM | POA: Diagnosis not present

## 2017-06-07 DIAGNOSIS — E782 Mixed hyperlipidemia: Secondary | ICD-10-CM | POA: Diagnosis present

## 2017-06-07 DIAGNOSIS — R109 Unspecified abdominal pain: Secondary | ICD-10-CM

## 2017-06-07 DIAGNOSIS — K5900 Constipation, unspecified: Secondary | ICD-10-CM | POA: Diagnosis not present

## 2017-06-07 DIAGNOSIS — K5790 Diverticulosis of intestine, part unspecified, without perforation or abscess without bleeding: Secondary | ICD-10-CM | POA: Diagnosis present

## 2017-06-07 DIAGNOSIS — J449 Chronic obstructive pulmonary disease, unspecified: Secondary | ICD-10-CM | POA: Diagnosis present

## 2017-06-07 DIAGNOSIS — M898X9 Other specified disorders of bone, unspecified site: Secondary | ICD-10-CM | POA: Diagnosis present

## 2017-06-07 DIAGNOSIS — I5189 Other ill-defined heart diseases: Secondary | ICD-10-CM

## 2017-06-07 DIAGNOSIS — I959 Hypotension, unspecified: Secondary | ICD-10-CM | POA: Diagnosis not present

## 2017-06-07 DIAGNOSIS — Z978 Presence of other specified devices: Secondary | ICD-10-CM

## 2017-06-07 DIAGNOSIS — R079 Chest pain, unspecified: Secondary | ICD-10-CM | POA: Diagnosis present

## 2017-06-07 DIAGNOSIS — R9431 Abnormal electrocardiogram [ECG] [EKG]: Secondary | ICD-10-CM | POA: Diagnosis present

## 2017-06-07 DIAGNOSIS — E875 Hyperkalemia: Secondary | ICD-10-CM | POA: Diagnosis not present

## 2017-06-07 DIAGNOSIS — R072 Precordial pain: Secondary | ICD-10-CM

## 2017-06-07 DIAGNOSIS — R4189 Other symptoms and signs involving cognitive functions and awareness: Secondary | ICD-10-CM | POA: Diagnosis not present

## 2017-06-07 DIAGNOSIS — F419 Anxiety disorder, unspecified: Secondary | ICD-10-CM | POA: Diagnosis present

## 2017-06-07 LAB — I-STAT CHEM 8, ED
BUN: 51 mg/dL — ABNORMAL HIGH (ref 6–20)
CALCIUM ION: 0.85 mmol/L — AB (ref 1.15–1.40)
Chloride: 91 mmol/L — ABNORMAL LOW (ref 101–111)
Creatinine, Ser: 11.5 mg/dL — ABNORMAL HIGH (ref 0.61–1.24)
GLUCOSE: 281 mg/dL — AB (ref 65–99)
HCT: 54 % — ABNORMAL HIGH (ref 39.0–52.0)
HEMOGLOBIN: 18.4 g/dL — AB (ref 13.0–17.0)
Potassium: 4.5 mmol/L (ref 3.5–5.1)
SODIUM: 137 mmol/L (ref 135–145)
TCO2: 34 mmol/L — AB (ref 22–32)

## 2017-06-07 LAB — CBC WITH DIFFERENTIAL/PLATELET
Basophils Absolute: 0 10*3/uL (ref 0.0–0.1)
Basophils Relative: 0 %
EOS PCT: 0 %
Eosinophils Absolute: 0 10*3/uL (ref 0.0–0.7)
HCT: 45.9 % (ref 39.0–52.0)
Hemoglobin: 15.4 g/dL (ref 13.0–17.0)
Lymphocytes Relative: 5 %
Lymphs Abs: 0.6 10*3/uL — ABNORMAL LOW (ref 0.7–4.0)
MCH: 35.8 pg — AB (ref 26.0–34.0)
MCHC: 33.6 g/dL (ref 30.0–36.0)
MCV: 106.7 fL — AB (ref 78.0–100.0)
MONOS PCT: 7 %
Monocytes Absolute: 1 10*3/uL (ref 0.1–1.0)
NEUTROS PCT: 88 %
Neutro Abs: 12.2 10*3/uL — ABNORMAL HIGH (ref 1.7–7.7)
PLATELETS: 235 10*3/uL (ref 150–400)
RBC: 4.3 MIL/uL (ref 4.22–5.81)
RDW: 17 % — ABNORMAL HIGH (ref 11.5–15.5)
WBC: 13.9 10*3/uL — ABNORMAL HIGH (ref 4.0–10.5)

## 2017-06-07 LAB — I-STAT TROPONIN, ED: TROPONIN I, POC: 0.02 ng/mL (ref 0.00–0.08)

## 2017-06-07 LAB — MRSA PCR SCREENING: MRSA by PCR: NEGATIVE

## 2017-06-07 LAB — COMPREHENSIVE METABOLIC PANEL
ALK PHOS: 56 U/L (ref 38–126)
ALT: 12 U/L — AB (ref 17–63)
ANION GAP: 24 — AB (ref 5–15)
AST: 23 U/L (ref 15–41)
Albumin: 4.2 g/dL (ref 3.5–5.0)
BUN: 44 mg/dL — ABNORMAL HIGH (ref 6–20)
CALCIUM: 9.8 mg/dL (ref 8.9–10.3)
CO2: 27 mmol/L (ref 22–32)
CREATININE: 11.71 mg/dL — AB (ref 0.61–1.24)
Chloride: 88 mmol/L — ABNORMAL LOW (ref 101–111)
GFR, EST AFRICAN AMERICAN: 4 mL/min — AB (ref >60)
GFR, EST NON AFRICAN AMERICAN: 4 mL/min — AB (ref >60)
Glucose, Bld: 282 mg/dL — ABNORMAL HIGH (ref 65–99)
Potassium: 4.6 mmol/L (ref 3.5–5.1)
Sodium: 139 mmol/L (ref 135–145)
Total Bilirubin: 0.9 mg/dL (ref 0.3–1.2)
Total Protein: 9.3 g/dL — ABNORMAL HIGH (ref 6.5–8.1)

## 2017-06-07 LAB — CBG MONITORING, ED: GLUCOSE-CAPILLARY: 203 mg/dL — AB (ref 65–99)

## 2017-06-07 LAB — TROPONIN I
TROPONIN I: 0.09 ng/mL — AB (ref <0.03)
Troponin I: 0.08 ng/mL (ref <0.03)

## 2017-06-07 LAB — GLUCOSE, CAPILLARY: Glucose-Capillary: 213 mg/dL — ABNORMAL HIGH (ref 65–99)

## 2017-06-07 LAB — LIPASE, BLOOD: LIPASE: 47 U/L (ref 11–51)

## 2017-06-07 MED ORDER — ACETAMINOPHEN 325 MG PO TABS: 650.0000 mg | ORAL_TABLET | ORAL | Status: DC | PRN

## 2017-06-07 MED ORDER — FLUOROMETHOLONE 0.1 % OP SUSP
1.0000 [drp] | Freq: Every day | OPHTHALMIC | Status: DC
  Administered 2017-06-08 – 2017-06-10 (×3): 1 [drp] via OPHTHALMIC
  Filled 2017-06-07: qty 5

## 2017-06-07 MED ORDER — ASPIRIN 325 MG PO TABS
325.0000 mg | ORAL_TABLET | Freq: Once | ORAL | Status: AC
  Administered 2017-06-07: 325 mg via ORAL
  Filled 2017-06-07: qty 1

## 2017-06-07 MED ORDER — ONDANSETRON HCL 4 MG/2ML IJ SOLN
4.0000 mg | Freq: Once | INTRAMUSCULAR | Status: AC
  Administered 2017-06-07: 4 mg via INTRAVENOUS
  Filled 2017-06-07: qty 2

## 2017-06-07 MED ORDER — PSYLLIUM 95 % PO PACK
1.0000 | PACK | Freq: Three times a day (TID) | ORAL | Status: DC | PRN
  Administered 2017-06-09: 1 via ORAL
  Filled 2017-06-07 (×2): qty 1

## 2017-06-07 MED ORDER — NEPRO/CARBSTEADY PO LIQD
237.0000 mL | Freq: Two times a day (BID) | ORAL | Status: DC
  Administered 2017-06-09 – 2017-06-10 (×2): 237 mL via ORAL
  Filled 2017-06-07 (×10): qty 237

## 2017-06-07 MED ORDER — NITROGLYCERIN IN D5W 200-5 MCG/ML-% IV SOLN
0.0000 ug/min | Freq: Once | INTRAVENOUS | Status: AC
  Administered 2017-06-07: 5 ug/min via INTRAVENOUS
  Filled 2017-06-07: qty 250

## 2017-06-07 MED ORDER — CALCIUM ACETATE (PHOS BINDER) 667 MG PO CAPS
667.0000 mg | ORAL_CAPSULE | Freq: Three times a day (TID) | ORAL | Status: DC
  Administered 2017-06-08 – 2017-06-11 (×8): 667 mg via ORAL
  Filled 2017-06-07: qty 1
  Filled 2017-06-07: qty 3
  Filled 2017-06-07 (×7): qty 1

## 2017-06-07 MED ORDER — FUROSEMIDE 20 MG PO TABS
40.0000 mg | ORAL_TABLET | Freq: Two times a day (BID) | ORAL | Status: DC
  Administered 2017-06-07 – 2017-06-08 (×2): 40 mg via ORAL
  Filled 2017-06-07 (×2): qty 2

## 2017-06-07 MED ORDER — INSULIN ASPART 100 UNIT/ML ~~LOC~~ SOLN
0.0000 [IU] | Freq: Every day | SUBCUTANEOUS | Status: DC
  Administered 2017-06-07: 2 [IU] via SUBCUTANEOUS

## 2017-06-07 MED ORDER — NITROGLYCERIN IN D5W 200-5 MCG/ML-% IV SOLN
0.0000 ug/min | INTRAVENOUS | Status: DC
  Administered 2017-06-07: 50 ug/min via INTRAVENOUS
  Administered 2017-06-08 (×2): 30 ug/min via INTRAVENOUS
  Filled 2017-06-07: qty 250

## 2017-06-07 MED ORDER — CINACALCET HCL 30 MG PO TABS
30.0000 mg | ORAL_TABLET | Freq: Every day | ORAL | Status: DC
  Administered 2017-06-09 – 2017-06-11 (×3): 30 mg via ORAL
  Filled 2017-06-07 (×5): qty 1

## 2017-06-07 MED ORDER — ONDANSETRON HCL 4 MG/2ML IJ SOLN: 4.0000 mg | Freq: Four times a day (QID) | INTRAMUSCULAR | Status: DC | PRN

## 2017-06-07 MED ORDER — QUETIAPINE FUMARATE 200 MG PO TABS
200.0000 mg | ORAL_TABLET | Freq: Every day | ORAL | Status: DC
  Administered 2017-06-07 – 2017-06-10 (×4): 200 mg via ORAL
  Filled 2017-06-07: qty 1
  Filled 2017-06-07 (×3): qty 4
  Filled 2017-06-07 (×2): qty 1
  Filled 2017-06-07: qty 4

## 2017-06-07 MED ORDER — ASPIRIN EC 325 MG PO TBEC: 325.0000 mg | DELAYED_RELEASE_TABLET | Freq: Every day | ORAL | Status: DC

## 2017-06-07 MED ORDER — CLONIDINE HCL 0.2 MG PO TABS
0.2000 mg | ORAL_TABLET | Freq: Three times a day (TID) | ORAL | Status: DC
  Administered 2017-06-07 – 2017-06-08 (×2): 0.2 mg via ORAL
  Filled 2017-06-07 (×2): qty 1

## 2017-06-07 MED ORDER — GABAPENTIN 100 MG PO CAPS
100.0000 mg | ORAL_CAPSULE | Freq: Two times a day (BID) | ORAL | Status: DC
  Administered 2017-06-07 – 2017-06-11 (×8): 100 mg via ORAL
  Filled 2017-06-07 (×8): qty 1

## 2017-06-07 MED ORDER — PANTOPRAZOLE SODIUM 40 MG PO TBEC
40.0000 mg | DELAYED_RELEASE_TABLET | Freq: Every day | ORAL | Status: DC
  Administered 2017-06-08 – 2017-06-11 (×4): 40 mg via ORAL
  Filled 2017-06-07 (×4): qty 1

## 2017-06-07 MED ORDER — LINAGLIPTIN 5 MG PO TABS
5.0000 mg | ORAL_TABLET | Freq: Every day | ORAL | Status: DC
  Administered 2017-06-08: 5 mg via ORAL
  Filled 2017-06-07: qty 1

## 2017-06-07 MED ORDER — LORAZEPAM 2 MG/ML IJ SOLN
0.5000 mg | Freq: Once | INTRAMUSCULAR | Status: AC
  Administered 2017-06-07: 0.5 mg via INTRAVENOUS
  Filled 2017-06-07: qty 1

## 2017-06-07 MED ORDER — BUPROPION HCL ER (XL) 150 MG PO TB24
150.0000 mg | ORAL_TABLET | Freq: Every day | ORAL | Status: DC
  Administered 2017-06-08 – 2017-06-11 (×4): 150 mg via ORAL
  Filled 2017-06-07 (×4): qty 1

## 2017-06-07 MED ORDER — OXYCODONE HCL 5 MG PO TABS: 5.0000 mg | ORAL_TABLET | Freq: Four times a day (QID) | ORAL | Status: DC | PRN

## 2017-06-07 MED ORDER — POLYVINYL ALCOHOL 1.4 % OP SOLN
1.0000 [drp] | Freq: Every day | OPHTHALMIC | Status: DC
  Administered 2017-06-07 – 2017-06-09 (×3): 1 [drp] via OPHTHALMIC
  Filled 2017-06-07 (×2): qty 15

## 2017-06-07 MED ORDER — INSULIN DETEMIR 100 UNIT/ML ~~LOC~~ SOLN
10.0000 [IU] | Freq: Every day | SUBCUTANEOUS | Status: DC
  Administered 2017-06-07 – 2017-06-10 (×4): 10 [IU] via SUBCUTANEOUS
  Filled 2017-06-07 (×4): qty 0.1

## 2017-06-07 MED ORDER — HEPARIN SODIUM (PORCINE) 5000 UNIT/ML IJ SOLN
5000.0000 [IU] | Freq: Three times a day (TID) | INTRAMUSCULAR | Status: DC
  Administered 2017-06-07 – 2017-06-11 (×9): 5000 [IU] via SUBCUTANEOUS
  Filled 2017-06-07 (×9): qty 1

## 2017-06-07 MED ORDER — INSULIN ASPART 100 UNIT/ML ~~LOC~~ SOLN
0.0000 [IU] | Freq: Three times a day (TID) | SUBCUTANEOUS | Status: DC
  Administered 2017-06-08 – 2017-06-09 (×2): 2 [IU] via SUBCUTANEOUS
  Administered 2017-06-09: 1 [IU] via SUBCUTANEOUS
  Administered 2017-06-10 – 2017-06-11 (×2): 3 [IU] via SUBCUTANEOUS

## 2017-06-07 MED ORDER — DOXAZOSIN MESYLATE 8 MG PO TABS
8.0000 mg | ORAL_TABLET | Freq: Every day | ORAL | Status: DC
  Administered 2017-06-09 – 2017-06-11 (×2): 8 mg via ORAL
  Filled 2017-06-07 (×5): qty 1

## 2017-06-07 MED ORDER — CARVEDILOL 6.25 MG PO TABS
6.2500 mg | ORAL_TABLET | Freq: Two times a day (BID) | ORAL | Status: DC
  Administered 2017-06-07 – 2017-06-11 (×6): 6.25 mg via ORAL
  Filled 2017-06-07 (×2): qty 2
  Filled 2017-06-07: qty 1
  Filled 2017-06-07 (×2): qty 2
  Filled 2017-06-07 (×2): qty 1
  Filled 2017-06-07: qty 2

## 2017-06-07 MED ORDER — LINACLOTIDE 145 MCG PO CAPS
145.0000 ug | ORAL_CAPSULE | Freq: Every day | ORAL | Status: DC
  Administered 2017-06-08 – 2017-06-11 (×4): 145 ug via ORAL
  Filled 2017-06-07 (×5): qty 1

## 2017-06-07 MED ORDER — CLOPIDOGREL BISULFATE 75 MG PO TABS
75.0000 mg | ORAL_TABLET | Freq: Every day | ORAL | Status: DC
  Administered 2017-06-08 – 2017-06-11 (×4): 75 mg via ORAL
  Filled 2017-06-07 (×4): qty 1

## 2017-06-07 MED ORDER — GI COCKTAIL ~~LOC~~
30.0000 mL | Freq: Four times a day (QID) | ORAL | Status: DC | PRN
  Filled 2017-06-07: qty 30

## 2017-06-07 MED ORDER — SIMVASTATIN 20 MG PO TABS
20.0000 mg | ORAL_TABLET | Freq: Every day | ORAL | Status: DC
  Administered 2017-06-07 – 2017-06-11 (×5): 20 mg via ORAL
  Filled 2017-06-07 (×5): qty 1

## 2017-06-07 MED ORDER — SEVELAMER CARBONATE 800 MG PO TABS
800.0000 mg | ORAL_TABLET | Freq: Three times a day (TID) | ORAL | Status: DC
  Administered 2017-06-08 – 2017-06-10 (×6): 800 mg via ORAL
  Filled 2017-06-07 (×9): qty 1

## 2017-06-07 NOTE — ED Notes (Signed)
Admit provider at bedside 

## 2017-06-07 NOTE — ED Provider Notes (Signed)
[facility] DEPT Provider Note   CSN: [id number] Arrival date & time: 06/15/2017  5361     History   Chief Complaint Chief Complaint  Patient presents with  . Chest Pain  . Emesis    HPI Gerald Davenport is a 76 y.o. 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 Patient lives at home with wife. Per the wife, he has been having epigastric pain and had poor appetite for 2 days. This morning he vomited and had constant chest pain. Per the wife, patient had clear vomiting and may be one episode of blood tinge vomit or blood in phlegm. Has no fevers at home. EMS was called and EKG showed diffuse changes and he was brought into the hospital for evaluation. He was given 3 nitros and now the pain has resolved. Patient had dialysis 2 days ago and never has an history of coronary artery disease. Of note, patient had admission in April this year for hypertensive urgency, pulmonary edema.   The history is provided by the patient.    Past Medical History:  Diagnosis Date  . Anemia   . Anxiety   . Cancer [facility])    Prostate cancer treated with radiation therapy  . Chronic kidney disease    dialysis Mon, Wednes, Fri  . Clot 05/2006   Arterial clot right leg   . Constipation   . Diabetes mellitus    Type II  . Diverticulosis   . GERD (gastroesophageal reflux disease)   . Hemodialysis patient ([place]) 6/13  . Hyperlipidemia   . Hypertension   . Infection    drainage from distal incision of left upper arm arteriovenous graft  . Positive PPD   . Shortness of breath dyspnea    with exertion, when he has too much fluid  . Steal syndrome dialysis vascular access ([place])   . Thyroid disease    hyperparathyroidism  . Wears glasses     Patient Active Problem List   Diagnosis Date Noted  . Atherosclerosis of native artery of left lower extremity with rest pain ([place]) 02/12/2017  . Acute  respiratory failure ([place]) 01/24/2017  . SOB (shortness of breath) 01/18/2017  . Hypertensive urgency 01/18/2017  . Confusion 09/08/2016  . Hypoglycemia   . Atherosclerosis of native arteries of extremity with rest pain ([place]) 05/23/2016  . 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 Infection 10/13/2015  . Infection of arteriovenous graft for hemodialysis ([place]) 10/12/2015  . 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 Diabetic neuropathy associated with type 2 diabetes mellitus ([place]) 05/25/2015  . Infection, dialysis vascular access ([place]) 04/19/2015  . Rectal bleeding 02/06/2015  . Dyspnea 03/24/2014  . Abnormal EKG 03/24/2014  . Essential hypertension, benign 03/15/2014  . Mixed hyperlipidemia 03/15/2014  . H/O adenomatous polyp of colon 11/07/2013  . Constipation 06/15/2013  . Encounter for screening colonoscopy 11/02/2012  . Fecal incontinence 09/10/2012  . GERD (gastroesophageal reflux disease) 09/10/2012  . Type 2 diabetes mellitus with ESRD (end-stage renal disease) ([place]) 08/06/2011  . CRF (chronic renal failure) 08/06/2011    Past Surgical History:  Procedure Laterality Date  . ABDOMINAL AORTOGRAM W/LOWER EXTREMITY N/A 02/12/2017   Procedure: Abdominal Aortogram w/Lower Extremity;  Surgeon: Conrad Ashippun, MD;  Location: [place] CV [facility];  Service: Cardiovascular;  Laterality: N/A;  . AV FISTULA PLACEMENT  11/04/2010   left and left  . AV FISTULA PLACEMENT  11/20/2011   Procedure: INSERTION OF ARTERIOVENOUS (AV) GORE-TEX GRAFT ARM;  Surgeon: Angelia Mould, MD;  Location: [place];  Service: Vascular;  Laterality: Right;  . AV FISTULA PLACEMENT Right 04/19/2015   Procedure: Excision of infected RUA AVG;  Surgeon: Conrad Lo[place], MD;  Location: [place];  Service: Vascular;  Laterality: Right;  . AV FISTULA PLACEMENT Left 10/04/2015   Procedure: INSERTION OF ARTERIOVENOUS (AV) GORE-TEX GRAFT ARM;  Surgeon: Conrad Harveyville, MD;  Location: [place];  Service: Vascular;   Laterality: Left;  . Winter REMOVAL Left 10/13/2015   Procedure: REMOVAL OF INFECTED ARTERIOVENOUS GORETEX LEFT GRAFT ;  Surgeon: Conrad Canalou, MD;  Location: [place];  Service: Vascular;  Laterality: Left;  . BASCILIC VEIN TRANSPOSITION Left 06/19/2015   Procedure: FIRST STAGE BRACHIAL VEIN TRANSPOSITION;  Surgeon: Conrad Ballard, MD;  [place];  Service: Vascular;  Laterality: Left;  . 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 COLONOSCOPY N/A 11/10/2012   [id number] proctitis/Pancolonic diverticulosis/(TUBULAR ADENOMA)5 mm polyp in the transverse segment/4 mm polyps in the base of the cecum and diminutive polyp in the descending segment. INADEQUATE BOWEL PREP  . COLONOSCOPY N/A 11/28/2013   RMR: Radiation proctitis. Colonic diverticulosis. Colonic polyps-removed as described above.   . INSERTION OF DIALYSIS CATHETER Right 01/19/2016   Procedure: INSERTION OF Right internal jugular  DIALYSIS CATHETER.;  Surgeon: Elam Dutch, MD;  Location: [place];  Service: Vascular;  Laterality: Right;  . PERIPHERAL VASCULAR BALLOON ANGIOPLASTY Left 02/12/2017   Procedure: Peripheral Vascular Balloon Angioplasty;  Surgeon: Conrad Barton, MD;  Location: [place] [facility];  Service: Cardiovascular;  Laterality: Left;  SFA  . PERIPHERAL VASCULAR CATHETERIZATION Bilateral 12/06/2015   Procedure: Upper Extremity Venography/ central venogram;  Surgeon: Conrad Perry, MD;  Location: [place] [facility];  Service: Cardiovascular;  Laterality: Bilateral;  . PERIPHERAL VASCULAR CATHETERIZATION N/A 05/29/2016   Procedure: Upper Extremity Angiography;  Surgeon: Conrad Luther, MD;  Location: [place] [facility];  Service: Cardiovascular;  Laterality: N/A;  . PERIPHERAL VASCULAR CATHETERIZATION N/A 05/29/2016   Procedure: Lower Extremity Angiography;  Surgeon: Conrad [place], MD;  Location: [place] [facility];  Service: Cardiovascular;  Laterality: N/A;  . PERIPHERAL VASCULAR CATHETERIZATION  05/29/2016   Procedure:  Peripheral Vascular Intervention;  Surgeon: Conrad Weiner, MD;  Location: [place] [facility];  Service: Cardiovascular;;  . REMOVAL OF A HERO DEVICE Right 01/19/2016   Procedure: REMOVAL OF A HERO DEVICE;  Surgeon: Elam Dutch, MD;  Location: [place];  Service: Vascular;  Laterality: Right;  . SHUNTOGRAM N/A 10/08/2012   Procedure: [name];  Surgeon: Elam Dutch, MD;  Location[facility];  Service: Cardiovascular;  Laterality: N/A;  . VASCULAR ACCESS DEVICE INSERTION Right 01/01/2016   Procedure: INSERTION OF HERO VASCULAR ACCESS DEVICE - RIGHT;  Surgeon: Conrad Gang Mills, MD;  Location: [place];  Service: Vascular;  Laterality: Right;       Home Medications    Prior to Admission medications   Medication Sig Start Date End Date Taking? Authorizing Provider  buPROPion (WELLBUTRIN XL) 150 MG 24 hr tablet Take 150 mg by mouth daily.    [provider]  calcium acetate (PHOSLO) 667 MG capsule Take 667-2,001 mg by mouth 3 (three) times daily with meals.     [provider]  carboxymethylcellulose (REFRESH PLUS) 0.5 % SOLN Place 1 drop into both eyes at bedtime.    [provider]  cinacalcet (SENSIPAR) 30 MG tablet Take 30 mg by mouth daily.    [provider]  cloNIDine (CATAPRES) 0.2 MG tablet Take 1 tablet (0.2 mg total) by mouth 3 (three) times daily. 01/19/16   [name], MD  clopidogrel (PLAVIX) 75 MG tablet Take 1 tablet (75 mg total) by mouth daily. 02/13/17 02/13/18  Ulyses Amor, PA-C  doxazosin (CARDURA) 8 MG tablet Take 8 mg by mouth daily.     [provider]  fluorometholone (FML) 0.1 % ophthalmic suspension Place 1 drop into both eyes at bedtime.    [provider]  furosemide (LASIX) 40 MG tablet Take 40 mg by mouth 2 (two) times daily.     [provider]  gabapentin (NEURONTIN) 100 MG capsule Take 100 mg by mouth 2 (two) times daily.    [provider]  Insulin Detemir (LEVEMIR FLEXTOUCH) 100 UNIT/ML  Pen Inject 10 Units into the skin at bedtime.     [provider]  Linaclotide Rolan Lipa) 145 MCG CAPS capsule Take 1 capsule (145 mcg total) by mouth daily. Take 30 minutes before breakfast 02/06/15   Carlis Stable, NP  Nutritional Supplements (FEEDING SUPPLEMENT, NEPRO CARB STEADY,) LIQD Take 237 mLs by mouth 2 (two) times daily between meals. 01/25/17   Raiford Noble Latif, DO  omeprazole (PRILOSEC) 20 MG capsule Take 20 mg by mouth daily.     [provider]  oxyCODONE (OXY IR/ROXICODONE) 5 MG immediate release tablet Take 5 mg by mouth every 6 (six) hours as needed for severe pain.     [provider]  psyllium (METAMUCIL) 58.6 % powder Take 1 packet by mouth 3 (three) times daily as needed (for constipation).     [provider]  QUEtiapine (SEROQUEL) 100 MG tablet Take 200 mg by mouth at bedtime.     [provider]  saxagliptin HCl (ONGLYZA) 5 MG TABS tablet Take 5 mg by mouth daily.     [provider]  sevelamer carbonate (RENVELA) 800 MG tablet Take 800 mg by mouth 3 (three) times daily with meals.     [provider]  simvastatin (ZOCOR) 20 MG tablet Take 20 mg by mouth at bedtime.     [provider]    Family History Family History  Problem Relation Age of Onset  . Heart disease Mother   . Diabetes Mother   . Hypertension Mother   . Stroke Mother   . Heart disease Father   . Diabetes Father   . Heart disease Sister   . Diabetes Brother   . Colon cancer Neg Hx     Social History Social History  Substance Use Topics  . Smoking status: Light Tobacco Smoker    Packs/day: 0.25    Years: 50.00    Types: Cigarettes  . Smokeless tobacco: Never Used     Comment: 4-5 cigarettes daily  . Alcohol use No     Allergies   Patient has no known allergies.   Review of Systems Review of Systems  Cardiovascular: Positive for chest pain.  Gastrointestinal: Positive for vomiting.  All other systems reviewed and  are negative.    Physical Exam Updated Vital Signs BP (!) 154/89 (BP Location: Right Arm)   Pulse (!) 108   Temp 97.7 F (36.5 C) (Oral)   Resp (!) 22   SpO2 99%   Physical Exam  Constitutional: He is oriented to person, place, and time.  Chronically ill, uncomfortable  HENT:  Head: Normocephalic.  MM slightly dry   Eyes: Pupils are equal, round, and reactive to light. Conjunctivae and EOM are normal.  Neck: Normal range of motion. Neck supple.  Cardiovascular: Normal rate, regular rhythm and normal heart sounds.   Pulmonary/Chest: Effort normal.  Diminished bilateral bases. R perma cath in place   Abdominal: Soft. Bowel sounds are normal. He exhibits no distension. There is no tenderness. There is no guarding.  Musculoskeletal: Normal range of motion.  R BKA   Neurological: He is alert and oriented to person, place, and time.  Skin: Skin is warm.  Psychiatric: He has a normal mood and affect.  Nursing note and vitals reviewed.    ED Treatments / Results  Labs (all labs ordered are listed, but only abnormal results are displayed) Labs Reviewed  CBC WITH DIFFERENTIAL/PLATELET - Abnormal; Notable for the following:       Result Value   WBC 13.9 (*)    MCV 106.7 (*)    MCH 35.8 (*)    RDW 17.0 (*)    Neutro Abs 12.2 (*)    Lymphs Abs 0.6 (*)    All other components within normal limits  I-STAT CHEM 8, ED - Abnormal; Notable for the following:    Chloride 91 (*)    BUN 51 (*)    Creatinine, Ser 11.50 (*)    Glucose, Bld 281 (*)    Calcium, Ion 0.85 (*)    TCO2 34 (*)    Hemoglobin 18.4 (*)    HCT 54.0 (*)    All other components within normal limits  COMPREHENSIVE METABOLIC PANEL  LIPASE, BLOOD  I-STAT TROPONIN, ED    EKG  EKG Interpretation  Date/Time:  Sunday June 07 2017 10:57:29 EDT Ventricular Rate:  103 PR Interval:    QRS Duration: 78 QT Interval:  389 QTC Calculation: 510 R Axis:   70 Text Interpretation:  Sinus tachycardia Probable  left atrial enlargement Repol abnrm suggests ischemia, inferior leads Prolonged QT interval No significant change since last tracing Confirmed by Wandra Arthurs [phone]) on 06/12/2017 11:04:41 AM       Radiology Dg Chest 2 View  Result Date: 06/20/2017 CLINICAL DATA:  Chest pain, nausea and vomiting. Coffee ground emesis per family. On dialysis. EXAM: CHEST  2 VIEW COMPARISON:  Chest x-ray dated 01/23/2017. FINDINGS: Cardiomediastinal silhouette is within normal limits. Atherosclerotic changes noted at the aortic arch. Lungs are clear. No pleural effusion or pneumothorax seen. Right-sided dialysis catheter appears appropriately position. No acute or suspicious osseous finding. IMPRESSION: No active cardiopulmonary disease. No evidence of pneumonia or pulmonary edema. Aortic atherosclerosis. Electronically Signed   By: Franki Cabot M.D.   On: 06/01/2017 10:32    Procedures Procedures (including critical care time)  CRITICAL CARE Performed by: Wandra Arthurs   Total critical care time: 30 minutes  Critical care time was exclusive of separately billable procedures and treating other patients.  Critical care was necessary to treat or prevent imminent or life-threatening deterioration.  Critical care was time spent personally by me on the following activities: development of treatment plan with patient and/or surrogate as well as nursing, discussions with consultants, evaluation of patient's response to treatment, examination of patient, obtaining history from patient or surrogate, ordering and performing treatments and interventions, ordering and review of laboratory studies, ordering and review of radiographic studies, pulse oximetry and re-evaluation of patient's condition.   Medications Ordered in ED Medications  nitroGLYCERIN 50 mg in dextrose 5 %  250 mL (0.2 mg/mL) infusion (not administered)  aspirin tablet 325 mg (not administered)  ondansetron (ZOFRAN) injection 4 mg (4 mg Intravenous  Given 06/01/2017 1059)     Initial Impression / Assessment and Plan / ED Course  I have reviewed the triage vital signs and the nursing notes.  Pertinent labs & imaging results that were available during my care of the patient were reviewed by me and considered in my medical decision making (see chart for details).    Gerald Davenport is a 76 y.o. male here with epigastric pain, chest pain. Has diffuse ST depressions that are new on the EKG. No active pain after nitro. He is hypertensive and has hx of hypertensive urgency previously. Consider hypertensive urgency vs ACS vs pancreatitis. Will get labs, trop, CXR, lipase. Will start nitro drip and consult cardiology.   11 am  Trop 0.02. Cr 11, baseline. CXR showed no obvious pulmonary edema. BP went up to 200 again. Started on nitro drip given EKG changes. Called Dr. Wynonia Lawman from cardiology who will consult on patient. Hospitalist to admit to stepdown.    Final Clinical Impressions(s) / ED Diagnoses   Final diagnoses:  None    New Prescriptions New Prescriptions   No medications on file     Drenda Freeze, MD 06/03/2017 1157

## 2017-06-07 NOTE — ED Notes (Signed)
Cardiology at bedside. Notified of patient's blood pressure. Will be admitted and bring blood pressure down slowly.

## 2017-06-07 NOTE — ED Notes (Signed)
Paged and spoke with admit Doctor regarding patient's symptoms and EKG sign off by EDP.

## 2017-06-07 NOTE — ED Notes (Signed)
Patient states chest pain 10/10 "it hurts"

## 2017-06-07 NOTE — Consult Note (Signed)
Cardiology Consultation:   Patient ID: Gerald Davenport; [id number]; 02/24/1941   Admit date: 06/25/2017 Date of Consult: 06/20/2017  Primary Care Provider: Alroy Dust, L.Marlou Sa, MD Primary Cardiologist: Dr. Radford Pax Primary Electrophysiologist:  NA   Patient Profile:   Gerald Davenport is a 76 y.o. male with a hx of HTN, HLD, DM and ESRD on HD  who is being seen today for the evaluation of chest pain at the request of Dr. Nehemiah Settle.  History of Present Illness:   Gerald Davenport  With a hx of HTN, HLD, DM and ESRD on HD and was seen by Dr. Radford Pax in 2015 for SOB with Dialysis, his nuc was normal and Echo with normal EF and mildly leaky AV.  He is RBKA for PAD and ulcer.  He has dialysis MWF  Now with chest pain- information per his wife pt is confused.  But beginning Friday he had nausea after getting hot, he also complained of chest pain and SOB.    These symptoms would come and go but by Sat night he was vomiting- brown liquid.  The chest pain and SOB also come and go.  Today it continued so he came here.  His wife notes that he is confused at home at times.  Here he is pulling off clothes at times.  He complains of severe pain and then no pain.  Currently no pain.  He is receiving IV ativan.  He is on IV NTG drip    EKG I personally reviewed and now with increased ST depression inf lat leads new from 12/2016.     Topronin 0.02 this AM  Cr is 11.50, BUN 51, glucose 281 K+ 4.5  Hgb is 18.4 and Hct is 54  CXR: No active cardiopulmonary disease. No evidence of pneumonia or pulmonary edema.  Aortic atherosclerosis  Past Medical History:  Diagnosis Date  . Anemia   . Anxiety   . Cancer [facility])    Prostate cancer treated with radiation therapy  . Chronic kidney disease    dialysis Mon, Wednes, Fri  . Clot 05/2006   Arterial clot right leg   . Constipation   . Diabetes mellitus    Type II  . Diverticulosis   . GERD (gastroesophageal reflux disease)   . Hemodialysis patient ([place]) 6/13  . Hyperlipidemia     . Hypertension   . Infection    drainage from distal incision of left upper arm arteriovenous graft  . Positive PPD   . Shortness of breath dyspnea    with exertion, when he has too much fluid  . Steal syndrome dialysis vascular access ([place])   . Thyroid disease    hyperparathyroidism  . Wears glasses     Past Surgical History:  Procedure Laterality Date  . ABDOMINAL AORTOGRAM W/LOWER EXTREMITY N/A 02/12/2017   Procedure: Abdominal Aortogram w/Lower Extremity;  Surgeon: Conrad Saluda, MD;  Location: [facility];  Service: Cardiovascular;  Laterality: N/A;  . AV FISTULA PLACEMENT  11/04/2010   left and left  . AV FISTULA PLACEMENT  11/20/2011   Procedure: INSERTION OF ARTERIOVENOUS (AV) GORE-TEX GRAFT ARM;  Surgeon: Angelia Mould, MD;  Location: [facility];  Service: Vascular;  Laterality: Right;  . AV FISTULA PLACEMENT Right 04/19/2015   Procedure: Excision of infected RUA AVG;  Surgeon: Conrad Nerstrand, MD;  Location: [facility];  Service: Vascular;  Laterality: Right;  . AV FISTULA PLACEMENT Left 10/04/2015   Procedure: INSERTION OF ARTERIOVENOUS (AV) GORE-TEX GRAFT ARM;  Surgeon: Conrad Fenwood, MD;  Location: [facility];  Service: Vascular;  Laterality: Left;  . [place] REMOVAL Left 10/13/2015   Procedure: REMOVAL OF INFECTED ARTERIOVENOUS GORETEX LEFT GRAFT ;  Surgeon: Conrad Weogufka, MD;  Location: [facility];  Service: Vascular;  Laterality: Left;  . BASCILIC VEIN TRANSPOSITION Left 06/19/2015   Procedure: FIRST STAGE BRACHIAL VEIN TRANSPOSITION;  Surgeon: Conrad Livingston, MD;  Location: [facility] [place];  Service: Vascular;  Laterality: Left;  . 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 COLONOSCOPY N/A 11/10/2012   ZOX:[id number] proctitis/Pancolonic diverticulosis/(TUBULAR ADENOMA)5 mm polyp in the transverse segment/4 mm polyps in the base of the cecum and diminutive polyp in the descending segment. INADEQUATE BOWEL PREP  . COLONOSCOPY N/A 11/28/2013   RMR: Radiation proctitis. Colonic diverticulosis.  Colonic polyps-removed as described above.   . INSERTION OF DIALYSIS CATHETER Right 01/19/2016   Procedure: INSERTION OF Right internal jugular  DIALYSIS CATHETER.;  Surgeon: Elam Dutch, MD;  Location: [facility];  Service: Vascular;  Laterality: Right;  . PERIPHERAL VASCULAR BALLOON ANGIOPLASTY Left 02/12/2017   Procedure: Peripheral Vascular Balloon Angioplasty;  Surgeon: Conrad Roberts, MD;  Location: [facility];  Service: Cardiovascular;  Laterality: Left;  SFA  . PERIPHERAL VASCULAR CATHETERIZATION Bilateral 12/06/2015   Procedure: Upper Extremity Venography/ central venogram;  Surgeon: Conrad Utuado, MD;  Location: [facility];  Service: Cardiovascular;  Laterality: Bilateral;  . PERIPHERAL VASCULAR CATHETERIZATION N/A 05/29/2016   Procedure: Upper Extremity Angiography;  Surgeon: Conrad Lostant, MD;  Location: [facility];  Service: Cardiovascular;  Laterality: N/A;  . PERIPHERAL VASCULAR CATHETERIZATION N/A 05/29/2016   Procedure: Lower Extremity Angiography;  Surgeon: Conrad Providence, MD;  Location: [facility];  Service: Cardiovascular;  Laterality: N/A;  . PERIPHERAL VASCULAR CATHETERIZATION  05/29/2016   Procedure: Peripheral Vascular Intervention;  Surgeon: Conrad Ledyard, MD;  Location: [facility];  Service: Cardiovascular;;  . REMOVAL OF A HERO DEVICE Right 01/19/2016   Procedure: REMOVAL OF A HERO DEVICE;  Surgeon: Elam Dutch, MD;  Location: [facility];  Service: Vascular;  Laterality: Right;  . SHUNTOGRAM N/A 10/08/2012   Procedure: [name];  Surgeon: Elam Dutch, MD;  Location: [facility];  Service: Cardiovascular;  Laterality: N/A;  . VASCULAR ACCESS DEVICE INSERTION Right 01/01/2016   Procedure: INSERTION OF HERO VASCULAR ACCESS DEVICE - RIGHT;  Surgeon: Conrad Trimont, MD;  Location: [facility];  Service: Vascular;  Laterality: Right;     Home Medications:  Prior to Admission medications   Medication Sig Start Date End Date Taking? Authorizing Provider   buPROPion (WELLBUTRIN XL) 150 MG 24 hr tablet Take 150 mg by mouth daily.    [provider]  calcium acetate (PHOSLO) 667 MG capsule Take 667-2,001 mg by mouth 3 (three) times daily with meals.     [provider]  carboxymethylcellulose (REFRESH PLUS) 0.5 % SOLN Place 1 drop into both eyes at bedtime.    [provider]  cinacalcet (SENSIPAR) 30 MG tablet Take 30 mg by mouth daily.    [provider]  cloNIDine (CATAPRES) 0.2 MG tablet Take 1 tablet (0.2 mg total) by mouth 3 (three) times daily. 01/19/16   [name], MD  clopidogrel (PLAVIX) 75 MG tablet Take 1 tablet (75 mg total) by mouth daily. 02/13/17 02/13/18  Ulyses Amor, PA-C  doxazosin (CARDURA) 8 MG tablet Take 8 mg by mouth daily.  [provider]  fluorometholone (FML) 0.1 % ophthalmic suspension Place 1 drop into both eyes at bedtime.    [provider]  furosemide (LASIX) 40 MG tablet Take 40 mg by mouth 2 (two) times daily.     [provider]  gabapentin (NEURONTIN) 100 MG capsule Take 100 mg by mouth 2 (two) times daily.    [provider]  Insulin Detemir (LEVEMIR FLEXTOUCH) 100 UNIT/ML Pen Inject 10 Units into the skin at bedtime.     [provider]  Linaclotide Rolan Lipa) 145 MCG CAPS capsule Take 1 capsule (145 mcg total) by mouth daily. Take 30 minutes before breakfast 02/06/15   Carlis Stable, NP  Nutritional Supplements (FEEDING SUPPLEMENT, NEPRO CARB STEADY,) LIQD Take 237 mLs by mouth 2 (two) times daily between meals. 01/25/17   Raiford Noble Latif, DO  omeprazole (PRILOSEC) 20 MG capsule Take 20 mg by mouth daily.     [provider]  oxyCODONE (OXY IR/ROXICODONE) 5 MG immediate release tablet Take 5 mg by mouth every 6 (six) hours as needed for severe pain.     [provider]  psyllium (METAMUCIL) 58.6 % powder Take 1 packet by mouth 3 (three) times daily as needed (for constipation).     [provider]    QUEtiapine (SEROQUEL) 100 MG tablet Take 200 mg by mouth at bedtime.     [provider]  saxagliptin HCl (ONGLYZA) 5 MG TABS tablet Take 5 mg by mouth daily.     [provider]  sevelamer carbonate (RENVELA) 800 MG tablet Take 800 mg by mouth 3 (three) times daily with meals.     [provider]  simvastatin (ZOCOR) 20 MG tablet Take 20 mg by mouth at bedtime.     [provider]    Inpatient Medications: Scheduled Meds:  Continuous Infusions:  PRN Meds:   Allergies:   No Known Allergies  Social History:   Social History   Social History  . Marital status: Married    Spouse name: N/A  . Number of children: N/A  . Years of education: N/A   Occupational History  . Not on file.   Social History Main Topics  . Smoking status: Light Tobacco Smoker    Packs/day: 0.25    Years: 50.00    Types: Cigarettes  . Smokeless tobacco: Never Used     Comment: 4-5 cigarettes daily  . Alcohol use No  . Drug use: No  . Sexual activity: Not on file   Other Topics Concern  . Not on file   Social History Narrative  . No narrative on file    Family History:    Family History  Problem Relation Age of Onset  . Heart disease Mother   . Diabetes Mother   . Hypertension Mother   . Stroke Mother   . Heart disease Father   . Diabetes Father   . Heart disease Sister   . Diabetes Brother   . Colon cancer Neg Hx      ROS:  Please see the history of present illness.  ROS  General:no colds or fevers, no weight changes Skin:no rashes or ulcers HEENT:no blurred vision, no congestion CV:see HPI PUL:see HPI GI:no diarrhea constipation or melena, no indigestion GU:no hematuria, no dysuria MS:no joint pain, no claudication, Rt BKA Neuro:no syncope, no lightheadedness Endo:+ diabetes, no thyroid disease    Physical Exam/Data:   Vitals:   06/08/2017 1400 06/06/2017 1415 06/09/2017 1430 06/04/2017 1530  BP: (!) 175/74 (!) 183/79 (!) 180/90 (!) 172/111   Pulse: (!) 103 (!) 106 (!) 106 (!) 108  Resp: 18 16 20  (!) 21  Temp:      TempSrc:      SpO2: 96% 98% 97% 100%   No intake or output data in the 24 hours ending 06/17/2017 1541 There were no vitals filed for this visit. There is no height or weight on file to calculate BMI.  General:  Thin , frail male, in no acute distress HEENT: normal Lymph: no adenopathy Neck: no JVD Endocrine:  No thryomegaly Vascular: No carotid bruits;  Cardiac:  normal S1, S2; RRR; no murmur gallup rub or click Lungs:  Breath sounds present but diminished  to auscultation bilaterally, no wheezing, rhonchi or rales  Abd: soft, nontender, no hepatomegaly  Ext: no edema Musculoskeletal:  RT BKA, BUE and BLE strength normal and equal Skin: warm and dry  Neuro:  Oriented to person, not date, MAE, follows commands, +facial symmetry. Psych:  Normal affect    Relevant CV Studies: 2015  ECHO Study Conclusions  - Left ventricle: The cavity size was normal. Wall thickness was increased in a pattern of mild LVH. Systolic function was normal. The estimated ejection fraction was in the range of 55% to 60%. Wall motion was normal; there were no regional wall motion abnormalities. Features are consistent with a pseudonormal left ventricular filling pattern, with concomitant abnormal relaxation and increased filling pressure (grade 2 diastolic dysfunction). - Aortic valve: There was mild regurgitation. - Left atrium: The atrium was mildly dilated. - Atrial septum: No defect or patent foramen ovale was identified.   NUC 2015 see above. Laboratory Data:  Chemistry Recent Labs Lab 06/12/2017 1014 06/18/2017 1047  NA 139 137  K 4.6 4.5  CL 88* 91*  CO2 27  --   GLUCOSE 282* 281*  BUN 44* 51*  CREATININE 11.71* 11.50*  CALCIUM 9.8  --   GFRNONAA 4*  --   GFRAA 4*  --   ANIONGAP 24*  --      Recent Labs Lab 06/22/2017 1014  PROT 9.3*  ALBUMIN 4.2  AST 23  ALT 12*  ALKPHOS 56  BILITOT 0.9    Hematology Recent Labs Lab 06/25/2017 1014 06/05/2017 1047  WBC 13.9*  --   RBC 4.30  --   HGB 15.4 18.4*  HCT 45.9 54.0*  MCV 106.7*  --   MCH 35.8*  --   MCHC 33.6  --   RDW 17.0*  --   PLT 235  --    Cardiac EnzymesNo results for input(s): TROPONINI in the last 168 hours.  Recent Labs Lab 06/12/2017 1030  TROPIPOC 0.02    BNPNo results for input(s): BNP, PROBNP in the last 168 hours.  DDimer No results for input(s): DDIMER in the last 168 hours.  Radiology/Studies:  Dg Chest 2 View  Result Date: 06/17/2017 CLINICAL DATA:  Chest pain, nausea and vomiting. Coffee ground emesis per family. On dialysis. EXAM: CHEST  2 VIEW COMPARISON:  Chest x-ray dated 01/23/2017. FINDINGS: Cardiomediastinal silhouette is within normal limits. Atherosclerotic changes noted at the aortic arch. Lungs are clear. No pleural effusion or pneumothorax seen. Right-sided dialysis catheter appears appropriately position. No acute or suspicious osseous finding. IMPRESSION: No active cardiopulmonary disease. No evidence of pneumonia or pulmonary edema. Aortic atherosclerosis. Electronically Signed   By: Franki Cabot M.D.   On: 06/24/2017 10:32    Assessment and Plan:   1. Chest pain with  abnormal EKG, follow serial troponins, hold heparin unless Troponin elevates then would recommend beginning IV heparin monitoring H/H.  Agree with IV NTG--  Will try to manage medically with confusion for now.  If symptoms persist will reconsider.   Dr. Rayann Heman has seen 2. HTN control, elevated now, controlling BP may help.  May be increased with aggitation. 3. HLD on zocor 4. DM per IM 5. Tobacco use 1 ppd needs to stop briefly discussed.      For questions or updates, please contact [facility] Please consult [email or web address] for contact info under Cardiology/STEMI. Daytime calls, contact the Day Call APP (6a-8a) or assigned team (Teams A-D) provider (7:30a - 5p). All other daytime calls (7:30-5p), contact the Card  Master @ [phone].   Nighttime calls, contact the assigned APP (5p-8p) or MD (6:30p-8p). Overnight calls (8p-6a), contact the on call Fellow @ [phone].   Signed, Cecilie Kicks, NP  06/12/2017 3:41 PM   I have seen, examined the patient, and reviewed the above assessment and plan. On exam, elderly, frail, and confused.  Changes to above are made where necessary.  Chest pain and ST depression in the setting of hypertensive urgency.  Would advise treatment of blood pressure and then reassess.  Given advanced age and comorbidities, a conservative approach may be best.  Cardiology to follow-up.  Co Sign: Thompson Grayer, MD 06/14/2017 5:16 PM

## 2017-06-07 NOTE — ED Notes (Signed)
Gave pt water, per Dr. Darl Householder.

## 2017-06-07 NOTE — ED Notes (Signed)
Patient starting to remove gown and leads. Replace leads and gown. Patient alert answering and following commands appropriate.

## 2017-06-07 NOTE — ED Triage Notes (Addendum)
Pt arrived by [facility] from home. Pt having cp and n/v. Per ems, family member reported coffee ground emesis. Pt is dialysis pt, last treatment was Friday. Pt also reports dizziness since last night. Was hypertensive on ems arrival, given two nitro pta and last bp was 154/76.

## 2017-06-07 NOTE — H&P (Signed)
History and Physical  CLAYBORNE DIVIS YNW:[id number] DOB: Nov 20, 1940 DOA: 06/06/2017  Referring physician: Dr Darl Householder, ED physician PCP: Alroy Dust, Carlean Jews.Marlou Sa, MD  Outpatient Specialists:   Bridgett Larsson (Vascular)  Lowanda Foster (Nephrology)  Patient Coming From: home  Chief Complaint: chest pain  HPI: Gerald Davenport is a 76 y.o. male with a history of a stage renal disease on hemodialysis Monday Wednesday Friday, type 2 diabetes on insulin, right BKA, hypertension, hyperlipidemia, GERD, peripheral vascular disease with peripheral angioplasties. Patient brought to the hospital by EMS due to severe substernal chest pain without radiation. He has never had this episode before. EMS was immediately called. He was given 3 doses of nitroglycerin with improvement of his chest pain after the third dose. His EKG showed some T-wave inversions and the patient was brought to [facility]. Here, the patient is pain-free. He does report a few episodes of nausea and vomiting yesterday, continued nausea today. He did have diaphoresis earlier, but this has resolved.  Emergency Department Course: Blood pressure 216/89. First troponin normal. Potassium normal. Glucose elevated. Patient started on nitro drip.  Review of Systems:   Pt denies any fevers, chills, nausea, vomiting, diarrhea, constipation, abdominal pain, shortness of breath, dyspnea on exertion, orthopnea, cough, wheezing, palpitations, headache, vision changes, lightheadedness, dizziness, melena, rectal bleeding.  Review of systems are otherwise negative  Past Medical History:  Diagnosis Date  . Anemia   . Anxiety   . Cancer [facility])    Prostate cancer treated with radiation therapy  . Chronic kidney disease    dialysis Mon, Wednes, Fri  . Clot 05/2006   Arterial clot right leg   . Constipation   . Diabetes mellitus    Type II  . Diverticulosis   . GERD (gastroesophageal reflux disease)   . Hemodialysis patient ([place]) 6/13  . Hyperlipidemia   . Hypertension   .  Infection    drainage from distal incision of left upper arm arteriovenous graft  . Positive PPD   . Shortness of breath dyspnea    with exertion, when he has too much fluid  . Steal syndrome dialysis vascular access ([place])   . Thyroid disease    hyperparathyroidism  . Wears glasses    Past Surgical History:  Procedure Laterality Date  . ABDOMINAL AORTOGRAM W/LOWER EXTREMITY N/A 02/12/2017   Procedure: Abdominal Aortogram w/Lower Extremity;  Surgeon: Conrad Norwalk, MD;  Location: [place] [facility];  Service: Cardiovascular;  Laterality: N/A;  . AV FISTULA PLACEMENT  11/04/2010   left and left  . AV FISTULA PLACEMENT  11/20/2011   Procedure: INSERTION OF ARTERIOVENOUS (AV) GORE-TEX GRAFT ARM;  Surgeon: Angelia Mould, MD;  Location: [place];  Service: Vascular;  Laterality: Right;  . AV FISTULA PLACEMENT Right 04/19/2015   Procedure: Excision of infected RUA AVG;  Surgeon: Conrad [place], MD;  Location: [place];  Service: Vascular;  Laterality: Right;  . AV FISTULA PLACEMENT Left 10/04/2015   Procedure: INSERTION OF ARTERIOVENOUS (AV) GORE-TEX GRAFT ARM;  Surgeon: Conrad Wrangell, MD;  Location: [place];  Service: Vascular;  Laterality: Left;  . Chicken REMOVAL Left 10/13/2015   Procedure: REMOVAL OF INFECTED ARTERIOVENOUS GORETEX LEFT GRAFT ;  Surgeon: Conrad [place], MD;  Location: [place];  Service: Vascular;  Laterality: Left;  . BASCILIC VEIN TRANSPOSITION Left 06/19/2015   Procedure: FIRST STAGE BRACHIAL VEIN TRANSPOSITION;  Surgeon: Conrad Nash, MD;  Location: [place];  Service: Vascular;  Laterality: Left;  . 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 COLONOSCOPY N/A 11/10/2012   ELF:[id number] proctitis/Pancolonic diverticulosis/(TUBULAR ADENOMA)5 mm polyp in the transverse segment/4 mm polyps in the base of the cecum and diminutive polyp in the descending segment. INADEQUATE BOWEL PREP  . COLONOSCOPY N/A 11/28/2013   RMR: Radiation proctitis. Colonic diverticulosis. Colonic polyps-removed as  described above.   . INSERTION OF DIALYSIS CATHETER Right 01/19/2016   Procedure: INSERTION OF Right internal jugular  DIALYSIS CATHETER.;  Surgeon: Elam Dutch, MD;  Location: [place];  Service: Vascular;  Laterality: Right;  . PERIPHERAL VASCULAR BALLOON ANGIOPLASTY Left 02/12/2017   Procedure: Peripheral Vascular Balloon Angioplasty;  Surgeon: Conrad Shirley, MD;  Location: [place] [facility];  Service: Cardiovascular;  Laterality: Left;  SFA  . PERIPHERAL VASCULAR CATHETERIZATION Bilateral 12/06/2015   Procedure: Upper Extremity Venography/ central venogram;  Surgeon: Conrad [employer], MD;  Location: [place] [facility];  Service: Cardiovascular;  Laterality: Bilateral;  . PERIPHERAL VASCULAR CATHETERIZATION N/A 05/29/2016   Procedure: Upper Extremity Angiography;  Surgeon: Conrad [place], MD;  Location: [place] [facility];  Service: Cardiovascular;  Laterality: N/A;  . PERIPHERAL VASCULAR CATHETERIZATION N/A 05/29/2016   Procedure: Lower Extremity Angiography;  Surgeon: Conrad Fronton, MD;  Location: [place] [facility];  Service: Cardiovascular;  Laterality: N/A;  . PERIPHERAL VASCULAR CATHETERIZATION  05/29/2016   Procedure: Peripheral Vascular Intervention;  Surgeon: Conrad McDowell, MD;  Location: [place] [facility];  Service: Cardiovascular;;  . REMOVAL OF A HERO DEVICE Right 01/19/2016   Procedure: REMOVAL OF A HERO DEVICE;  Surgeon: Elam Dutch, MD;  Location: [place];  Service: Vascular;  Laterality: Right;  . SHUNTOGRAM N/A 10/08/2012   Procedure: [name];  Surgeon: Elam Dutch, MD;  Location: [facility] CATH LAB;  Service: Cardiovascular;  Laterality: N/A;  . VASCULAR ACCESS DEVICE INSERTION Right 01/01/2016   Procedure: INSERTION OF HERO VASCULAR ACCESS DEVICE - RIGHT;  Surgeon: Conrad Whitewright, MD;  Location: [place];  Service: Vascular;  Laterality: Right;   Social History:  reports that he has been smoking Cigarettes.  He has a 12.50 pack-year smoking history. He has never used smokeless tobacco. He  reports that he does not drink alcohol or use drugs. Patient lives at Home  No Known Allergies  Family History  Problem Relation Age of Onset  . Heart disease Mother   . Diabetes Mother   . Hypertension Mother   . Stroke Mother   . Heart disease Father   . Diabetes Father   . Heart disease Sister   . Diabetes Brother   . Colon cancer Neg Hx       Prior to Admission medications   Medication Sig Start Date End Date Taking? Authorizing Provider  buPROPion (WELLBUTRIN XL) 150 MG 24 hr tablet Take 150 mg by mouth daily.    [provider]  calcium acetate (PHOSLO) 667 MG capsule Take 667-2,001 mg by mouth 3 (three) times daily with meals.     [provider]  carboxymethylcellulose (REFRESH PLUS) 0.5 % SOLN Place 1 drop into both eyes at bedtime.    [provider]  cinacalcet (SENSIPAR) 30 MG tablet Take 30 mg by mouth daily.    [provider]  cloNIDine (CATAPRES) 0.2 MG tablet Take 1 tablet (0.2 mg total) by mouth 3 (three) times daily. 01/19/16   [name], MD  clopidogrel (PLAVIX) 75 MG tablet Take 1 tablet (75 mg total) by mouth daily. 02/13/17 02/13/18  Ulyses Amor,  PA-C  doxazosin (CARDURA) 8 MG tablet Take 8 mg by mouth daily.     [provider]  fluorometholone (FML) 0.1 % ophthalmic suspension Place 1 drop into both eyes at bedtime.    [provider]  furosemide (LASIX) 40 MG tablet Take 40 mg by mouth 2 (two) times daily.     [provider]  gabapentin (NEURONTIN) 100 MG capsule Take 100 mg by mouth 2 (two) times daily.    [provider]  Insulin Detemir (LEVEMIR FLEXTOUCH) 100 UNIT/ML Pen Inject 10 Units into the skin at bedtime.     [provider]  Linaclotide Rolan Lipa) 145 MCG CAPS capsule Take 1 capsule (145 mcg total) by mouth daily. Take 30 minutes before breakfast 02/06/15   Carlis Stable, NP  Nutritional Supplements (FEEDING SUPPLEMENT, NEPRO CARB STEADY,) LIQD Take 237 mLs by mouth  2 (two) times daily between meals. 01/25/17   Raiford Noble Latif, DO  omeprazole (PRILOSEC) 20 MG capsule Take 20 mg by mouth daily.     [provider]  oxyCODONE (OXY IR/ROXICODONE) 5 MG immediate release tablet Take 5 mg by mouth every 6 (six) hours as needed for severe pain.     [provider]  psyllium (METAMUCIL) 58.6 % powder Take 1 packet by mouth 3 (three) times daily as needed (for constipation).     [provider]  QUEtiapine (SEROQUEL) 100 MG tablet Take 200 mg by mouth at bedtime.     [provider]  saxagliptin HCl (ONGLYZA) 5 MG TABS tablet Take 5 mg by mouth daily.     [provider]  sevelamer carbonate (RENVELA) 800 MG tablet Take 800 mg by mouth 3 (three) times daily with meals.     [provider]  simvastatin (ZOCOR) 20 MG tablet Take 20 mg by mouth at bedtime.     [provider]    Physical Exam: BP (!) 204/94   Pulse (!) 105   Temp 97.8 F (36.6 C)   Resp 20   SpO2 99%   General: Elderly black male. Awake and alert and oriented x3. No acute cardiopulmonary distress.  HEENT: Normocephalic atraumatic.  Right and left ears normal in appearance.  Pupils equal, round, reactive to light. Extraocular muscles are intact. Sclerae anicteric and noninjected.  Moist mucosal membranes. No mucosal lesions.  Neck: Neck supple without lymphadenopathy. No carotid bruits. No masses palpated.  Cardiovascular: Regular rate with normal S1-S2 sounds. No murmurs, rubs, gallops auscultated. No JVD.  Respiratory: Good respiratory effort with no wheezes, rales, rhonchi. Lungs clear to auscultation bilaterally.  No accessory muscle use. Abdomen: Soft, nontender, nondistended. Active bowel sounds. No masses or hepatosplenomegaly  Skin: No rashes, lesions, or ulcerations.  Dry, warm to touch. 2+ dorsalis pedis and radial pulses. Musculoskeletal: No calf or leg pain. All major joints not erythematous nontender.  No upper or lower  joint deformation.  Good ROM.  No contractures  Psychiatric: Intact judgment and insight. Pleasant and cooperative. Neurologic: No focal neurological deficits. Strength is 5/5 and symmetric in upper and lower extremities.  Cranial nerves II through XII are grossly intact.           Labs on Admission: I have personally reviewed following labs and imaging studies  CBC:  Recent Labs Lab 06/05/2017 1014 06/09/2017 1047  WBC 13.9*  --   NEUTROABS 12.2*  --   HGB 15.4 18.4*  HCT 45.9 54.0*  MCV 106.7*  --   PLT 235  --  Basic Metabolic Panel:  Recent Labs Lab 06/27/2017 1014 06/06/2017 1047  NA 139 137  K 4.6 4.5  CL 88* 91*  CO2 27  --   GLUCOSE 282* 281*  BUN 44* 51*  CREATININE 11.71* 11.50*  CALCIUM 9.8  --    GFR: CrCl cannot be calculated (Unknown ideal weight.). Liver Function Tests:  Recent Labs Lab 06/19/2017 1014  AST 23  ALT 12*  ALKPHOS 56  BILITOT 0.9  PROT 9.3*  ALBUMIN 4.2    Recent Labs Lab 06/09/2017 1014  LIPASE 47   No results for input(s): AMMONIA in the last 168 hours. Coagulation Profile: No results for input(s): INR, PROTIME in the last 168 hours. Cardiac Enzymes: No results for input(s): CKTOTAL, CKMB, CKMBINDEX, TROPONINI in the last 168 hours. BNP (last 3 results) No results for input(s): PROBNP in the last 8760 hours. HbA1C: No results for input(s): HGBA1C in the last 72 hours. CBG: No results for input(s): GLUCAP in the last 168 hours. Lipid Profile: No results for input(s): CHOL, HDL, LDLCALC, TRIG, CHOLHDL, LDLDIRECT in the last 72 hours. Thyroid Function Tests: No results for input(s): TSH, T4TOTAL, FREET4, T3FREE, THYROIDAB in the last 72 hours. Anemia Panel: No results for input(s): VITAMINB12, FOLATE, FERRITIN, TIBC, IRON, RETICCTPCT in the last 72 hours. Urine analysis: No results found for: COLORURINE, APPEARANCEUR, LABSPEC, PHURINE, GLUCOSEU, HGBUR, BILIRUBINUR, KETONESUR, PROTEINUR, UROBILINOGEN, NITRITE,  LEUKOCYTESUR Sepsis Labs: @LABRCNTIP (procalcitonin:4,lacticidven:4) )No results found for this or any previous visit (from the past 240 hour(s)).   Radiological Exams on Admission: Dg Chest 2 View  Result Date: 06/23/2017 CLINICAL DATA:  Chest pain, nausea and vomiting. Coffee ground emesis per family. On dialysis. EXAM: CHEST  2 VIEW COMPARISON:  Chest x-ray dated 01/23/2017. FINDINGS: Cardiomediastinal silhouette is within normal limits. Atherosclerotic changes noted at the aortic arch. Lungs are clear. No pleural effusion or pneumothorax seen. Right-sided dialysis catheter appears appropriately position. No acute or suspicious osseous finding. IMPRESSION: No active cardiopulmonary disease. No evidence of pneumonia or pulmonary edema. Aortic atherosclerosis. Electronically Signed   By: Franki Cabot M.D.   On: 06/17/2017 10:32    EKG: Independently reviewed. Sinus tachycardia. Inferior and lateral T-wave inversions with possible ST depression. These are new since 01/23/17.  Assessment/Plan: Principal Problem:   Chest pain Active Problems:   Type 2 diabetes mellitus with ESRD (end-stage renal disease) (HCC)   GERD (gastroesophageal reflux disease)   T wave inversion in EKG   ESRD on dialysis [facility])   Hypertensive urgency    This patient was discussed with the ED physician, including pertinent vitals, physical exam findings, labs, and imaging.  We also discussed care given by the ED provider.  #1 chest pain  Admit to stepdown  Continue nitro drip  Continue telemetry  Troponins #2 T-wave inversion in EKG  Cardiology consultant #3 hypertension urgency  Continue nitro drip  Restart home medications  Patient is on clonidine - may be experiencing rebound hypertension #4 end-stage renal disease on dialysis  Nephrology consulted for routine dialysis #5 type 2 diabetes  Continue home hypoglycemics #6 GERD  Continue home reflux medication  DVT prophylaxis:  Heparin Consultants: Nephrology and cardiology Code Status: Full code Family Communication: Wife and daughter in the room  Disposition Plan: Patient should be able to return home following admission   Davier Tramell Moores [facility] Pager [phone]  If 7PM-7AM, please contact night-coverage [email or web address] Password [email or web address]

## 2017-06-07 NOTE — ED Notes (Signed)
Patient states feels more relaxed family at bedside.

## 2017-06-07 NOTE — ED Notes (Signed)
Patient transported to X-ray 

## 2017-06-07 NOTE — ED Notes (Signed)
Doctor at bedside.

## 2017-06-07 NOTE — ED Notes (Signed)
While provider in room patient stated currently denies chest pain.

## 2017-06-07 NOTE — ED Notes (Signed)
Patient family member arrived explained patient will be admitted and she stated she will go to patients house to get his medications for confirmation.

## 2017-06-07 NOTE — ED Notes (Signed)
EKG given to EDP. No changes from previous EKG.

## 2017-06-07 NOTE — Plan of Care (Signed)
Problem: Safety: Goal: Ability to remain free from injury will improve Outcome: Progressing Bed alarm implemented. Patient educated on utilization of call light system to call for assistance when necessary. Frequently reminded to call for assistance

## 2017-06-08 DIAGNOSIS — R748 Abnormal levels of other serum enzymes: Secondary | ICD-10-CM

## 2017-06-08 DIAGNOSIS — R9431 Abnormal electrocardiogram [ECG] [EKG]: Secondary | ICD-10-CM

## 2017-06-08 LAB — RENAL FUNCTION PANEL
ALBUMIN: 4 g/dL (ref 3.5–5.0)
ANION GAP: 21 — AB (ref 5–15)
BUN: 69 mg/dL — AB (ref 6–20)
CALCIUM: 9.1 mg/dL (ref 8.9–10.3)
CO2: 31 mmol/L (ref 22–32)
Chloride: 86 mmol/L — ABNORMAL LOW (ref 101–111)
Creatinine, Ser: 14.18 mg/dL — ABNORMAL HIGH (ref 0.61–1.24)
GFR calc Af Amer: 3 mL/min — ABNORMAL LOW (ref >60)
GFR calc non Af Amer: 3 mL/min — ABNORMAL LOW (ref >60)
GLUCOSE: 152 mg/dL — AB (ref 65–99)
PHOSPHORUS: 8 mg/dL — AB (ref 2.5–4.6)
Potassium: 4.7 mmol/L (ref 3.5–5.1)
SODIUM: 138 mmol/L (ref 135–145)

## 2017-06-08 LAB — GLUCOSE, CAPILLARY
GLUCOSE-CAPILLARY: 118 mg/dL — AB (ref 65–99)
GLUCOSE-CAPILLARY: 139 mg/dL — AB (ref 65–99)
Glucose-Capillary: 190 mg/dL — ABNORMAL HIGH (ref 65–99)
Glucose-Capillary: 162 mg/dL — ABNORMAL HIGH (ref 65–99)

## 2017-06-08 LAB — TROPONIN I: TROPONIN I: 0.08 ng/mL — AB (ref <0.03)

## 2017-06-08 MED ORDER — CALCITRIOL 0.25 MCG PO CAPS
0.7500 ug | ORAL_CAPSULE | ORAL | Status: DC
  Administered 2017-06-08 – 2017-06-10 (×2): 0.75 ug via ORAL
  Filled 2017-06-08 (×2): qty 1

## 2017-06-08 MED ORDER — RENA-VITE PO TABS
1.0000 | ORAL_TABLET | Freq: Every day | ORAL | Status: DC
  Administered 2017-06-08 – 2017-06-12 (×5): 1 via ORAL
  Filled 2017-06-08 (×5): qty 1

## 2017-06-08 MED ORDER — ASPIRIN EC 81 MG PO TBEC
81.0000 mg | DELAYED_RELEASE_TABLET | Freq: Every day | ORAL | Status: DC
  Administered 2017-06-08 – 2017-06-11 (×4): 81 mg via ORAL
  Filled 2017-06-08 (×4): qty 1

## 2017-06-08 MED ORDER — SODIUM CHLORIDE 0.9 % IV SOLN
62.5000 mg | INTRAVENOUS | Status: DC
  Administered 2017-06-10: 62.5 mg via INTRAVENOUS
  Filled 2017-06-08 (×2): qty 5

## 2017-06-08 MED ORDER — ISOSORBIDE MONONITRATE ER 30 MG PO TB24
30.0000 mg | ORAL_TABLET | Freq: Every day | ORAL | Status: DC
  Administered 2017-06-08 – 2017-06-11 (×3): 30 mg via ORAL
  Filled 2017-06-08 (×4): qty 1

## 2017-06-08 MED ORDER — HYDRALAZINE HCL 50 MG PO TABS
50.0000 mg | ORAL_TABLET | Freq: Two times a day (BID) | ORAL | Status: DC
  Administered 2017-06-08 – 2017-06-09 (×2): 50 mg via ORAL
  Filled 2017-06-08 (×2): qty 1

## 2017-06-08 MED ORDER — CLONIDINE HCL 0.2 MG PO TABS
0.2000 mg | ORAL_TABLET | Freq: Two times a day (BID) | ORAL | Status: DC
  Administered 2017-06-08: 0.2 mg via ORAL
  Filled 2017-06-08 (×2): qty 1

## 2017-06-08 NOTE — Procedures (Signed)
I was present at this session.  I have reviewed the session itself and made appropriate changes.  Hd via UA AVF.  tol HD well.   Guinevere Stephenson L 9/10/20181:51 PM

## 2017-06-08 NOTE — Consult Note (Signed)
[facility] Renal Consultation Note  Indication for Consultation:  Management of ESRD/hemodialysis; anemia, hypertension/volume and secondary hyperparathyroidism  HPI: Gerald Davenport is a 76 y.o. 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 Admitted witth Chest pain and HTN Urgency with CXR showing no excess volume . He is somewhat poor historian but tells me  now remembers having N/V  And then chest pain yesterday .  Denies Fevers , chills, sob, abd pain . His last HD was Friday 9/07 on schedule at [place] [facility]. Currently with out cos. Noted cardiology seeing  In  Consult= past HO seen by Dr. Radford Pax in 2015 for SOB with Dialysis, his nuc was normal and Echo with normal EF and mildly leaky AV.  V x 3 yest.  Coughing up brown phlegm.  We will plan for HD today on schedule     -       Past Medical History:  Diagnosis Date  . Anemia   . Anxiety   . Cancer [facility])    Prostate cancer treated with radiation therapy  . Chronic kidney disease    dialysis Mon, Wednes, Fri  . Clot 05/2006   Arterial clot right leg   . Constipation   . Diabetes mellitus    Type II  . Diverticulosis   . GERD (gastroesophageal reflux disease)   . Hemodialysis patient ([place]) 6/13  . Hyperlipidemia   . Hypertension   . Infection    drainage from distal incision of left upper arm arteriovenous graft  . Positive PPD   . Shortness of breath dyspnea    with exertion, when he has too much fluid  . Steal syndrome dialysis vascular access ([place])   . Thyroid disease    hyperparathyroidism  . Wears glasses     Past Surgical History:  Procedure Laterality Date  . ABDOMINAL AORTOGRAM W/LOWER EXTREMITY N/A 02/12/2017   Procedure: Abdominal Aortogram w/Lower Extremity;  Surgeon: Conrad Sterling, MD;  Location: [place] [facility];  Service: Cardiovascular;  Laterality: N/A;  . AV FISTULA PLACEMENT  11/04/2010   left and left  . AV FISTULA PLACEMENT  11/20/2011   Procedure: INSERTION OF ARTERIOVENOUS (AV) GORE-TEX GRAFT ARM;  Surgeon: Angelia Mould, MD;  Location: [place];  Service: Vascular;  Laterality: Right;  . AV FISTULA PLACEMENT Right 04/19/2015   Procedure: Excision of infected RUA AVG;  Surgeon: Conrad [place], MD;  Location: [place];  Service: Vascular;  Laterality: Right;  . AV FISTULA PLACEMENT Left 10/04/2015   Procedure: INSERTION OF ARTERIOVENOUS (AV) GORE-TEX GRAFT ARM;  Surgeon: Conrad Gholson, MD;  Location: [place];  Service: Vascular;  Laterality: Left;  . [place] REMOVAL Left 10/13/2015   Procedure: REMOVAL OF INFECTED ARTERIOVENOUS GORETEX LEFT GRAFT ;  Surgeon: Conrad [place], MD;  Location: [place];  Service: Vascular;  Laterality: Left;  . BASCILIC VEIN TRANSPOSITION Left 06/19/2015   Procedure: FIRST STAGE BRACHIAL VEIN TRANSPOSITION;  Surgeon: Conrad [place], MD;  Location: [place];  Service: Vascular;  Laterality: Left;  . 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 COLONOSCOPY N/A 11/10/2012   JSE:[id number] proctitis/Pancolonic diverticulosis/(TUBULAR ADENOMA)5 mm polyp in the transverse segment/4 mm polyps in the base of the cecum and diminutive polyp in the descending segment. INADEQUATE  BOWEL PREP  . COLONOSCOPY N/A 11/28/2013   RMR: Radiation proctitis. Colonic diverticulosis. Colonic polyps-removed as described above.   . INSERTION OF DIALYSIS CATHETER Right 01/19/2016   Procedure: INSERTION OF Right internal jugular  DIALYSIS CATHETER.;  Surgeon: Elam Dutch, MD;  Location: [place];  Service: Vascular;  Laterality: Right;  . PERIPHERAL VASCULAR BALLOON ANGIOPLASTY Left 02/12/2017   Procedure: Peripheral Vascular Balloon Angioplasty;  Surgeon: Conrad [place], MD;  Location: [place] [facility];  Service: Cardiovascular;  Laterality: Left;  SFA  . PERIPHERAL VASCULAR CATHETERIZATION  Bilateral 12/06/2015   Procedure: Upper Extremity Venography/ central venogram;  Surgeon: Conrad [place], MD;  Location: [place] [facility];  Service: Cardiovascular;  Laterality: Bilateral;  . PERIPHERAL VASCULAR CATHETERIZATION N/A 05/29/2016   Procedure: Upper Extremity Angiography;  Surgeon: Conrad [place], MD;  Location: [place] [facility];  Service: Cardiovascular;  Laterality: N/A;  . PERIPHERAL VASCULAR CATHETERIZATION N/A 05/29/2016   Procedure: Lower Extremity Angiography;  Surgeon: Conrad [place], MD;  Location: [place] [facility];  Service: Cardiovascular;  Laterality: N/A;  . PERIPHERAL VASCULAR CATHETERIZATION  05/29/2016   Procedure: Peripheral Vascular Intervention;  Surgeon: Conrad [place], MD;  Location: [place] [facility];  Service: Cardiovascular;;  . REMOVAL OF A HERO DEVICE Right 01/19/2016   Procedure: REMOVAL OF A HERO DEVICE;  Surgeon: Elam Dutch, MD;  Location: [place];  Service: Vascular;  Laterality: Right;  . SHUNTOGRAM N/A 10/08/2012   Procedure: [name];  Surgeon: Elam Dutch, MD;  Location: [facility];  Service: Cardiovascular;  Laterality: N/A;  . VASCULAR ACCESS DEVICE INSERTION Right 01/01/2016   Procedure: INSERTION OF HERO VASCULAR ACCESS DEVICE - RIGHT;  Surgeon: Conrad Horace, MD;  Location: [place];  Service: Vascular;  Laterality: Right;      Family History  Problem Relation Age of Onset  . Heart disease Mother   . Diabetes Mother   . Hypertension Mother   . Stroke Mother   . Heart disease Father   . Diabetes Father   . Heart disease Sister   . Diabetes Brother   . Colon cancer Neg Hx       reports that he has been smoking Cigarettes.  He has a 12.50 pack-year smoking history. He has never used smokeless tobacco. He reports that he does not drink alcohol or use drugs.  No Known Allergies  Prior to Admission medications   Medication Sig Start Date End Date Taking? Authorizing Provider  buPROPion (WELLBUTRIN XL) 150 MG 24 hr tablet Take 150 mg by  mouth daily.   Yes [provider]  calcium acetate (PHOSLO) 667 MG capsule Take 1 tablet by mouth daily.   Yes [provider]  cinacalcet (SENSIPAR) 30 MG tablet Take 30 mg by mouth daily.   Yes [provider]  doxazosin (CARDURA) 8 MG tablet Take 8 mg by mouth daily.    Yes [provider]  furosemide (LASIX) 40 MG tablet Take 40 mg by mouth 2 (two) times daily.    Yes [provider]  omeprazole (PRILOSEC) 20 MG capsule Take 20 mg by mouth daily.    Yes [provider]  QUEtiapine (SEROQUEL) 100 MG tablet Take 200 mg by mouth at bedtime.    Yes [provider]  sevelamer carbonate (RENVELA) 800 MG tablet Take 800 mg by mouth 3 (three) times daily with meals.    Yes [provider]  simvastatin (ZOCOR) 20 MG tablet Take 20  mg by mouth at bedtime.    Yes [provider]  calcium acetate (PHOSLO) 667 MG capsule Take 667-2,001 mg by mouth 3 (three) times daily with meals.     [provider]  carboxymethylcellulose (REFRESH PLUS) 0.5 % SOLN Place 1 drop into both eyes at bedtime.    [provider]  cloNIDine (CATAPRES) 0.2 MG tablet Take 1 tablet (0.2 mg total) by mouth 3 (three) times daily. 01/19/16   [name], MD  clopidogrel (PLAVIX) 75 MG tablet Take 1 tablet (75 mg total) by mouth daily. 02/13/17 02/13/18  Ulyses Amor, PA-C  fluorometholone (FML) 0.1 % ophthalmic suspension Place 1 drop into both eyes at bedtime.    [provider]  gabapentin (NEURONTIN) 100 MG capsule Take 100 mg by mouth 2 (two) times daily.    [provider]  Insulin Detemir (LEVEMIR FLEXTOUCH) 100 UNIT/ML Pen Inject 10 Units into the skin at bedtime.     [provider]  Linaclotide Rolan Lipa) 145 MCG CAPS capsule Take 1 capsule (145 mcg total) by mouth daily. Take 30 minutes before breakfast 02/06/15   Carlis Stable, NP  Nutritional Supplements (FEEDING SUPPLEMENT, NEPRO CARB STEADY,) LIQD  Take 237 mLs by mouth 2 (two) times daily between meals. 01/25/17   Raiford Noble Latif, DO  oxyCODONE (OXY IR/ROXICODONE) 5 MG immediate release tablet Take 5 mg by mouth every 6 (six) hours as needed for severe pain.     [provider]  psyllium (METAMUCIL) 58.6 % powder Take 1 packet by mouth 3 (three) times daily as needed (for constipation).     [provider]  saxagliptin HCl (ONGLYZA) 5 MG TABS tablet Take 5 mg by mouth daily.     [provider]    WLN:LGXQJJHERDEYC, gi cocktail, ondansetron (ZOFRAN) IV, oxyCODONE, psyllium  Results for orders placed or performed during the hospital encounter of 06/15/2017 (from the past 48 hour(s))  CBC with Differential/Platelet     Status: Abnormal   Collection Time: 06/27/2017 10:14 AM  Result Value Ref Range   WBC 13.9 (H) 4.0 - 10.5 K/uL   RBC 4.30 4.22 - 5.81 MIL/uL   Hemoglobin 15.4 13.0 - 17.0 g/dL   HCT 45.9 39.0 - 52.0 %   MCV 106.7 (H) 78.0 - 100.0 fL   MCH 35.8 (H) 26.0 - 34.0 pg   MCHC 33.6 30.0 - 36.0 g/dL   RDW 17.0 (H) 11.5 - 15.5 %   Platelets 235 150 - 400 K/uL   Neutrophils Relative % 88 %   Neutro Abs 12.2 (H) 1.7 - 7.7 K/uL   Lymphocytes Relative 5 %   Lymphs Abs 0.6 (L) 0.7 - 4.0 K/uL   Monocytes Relative 7 %   Monocytes Absolute 1.0 0.1 - 1.0 K/uL   Eosinophils Relative 0 %   Eosinophils Absolute 0.0 0.0 - 0.7 K/uL   Basophils Relative 0 %   Basophils Absolute 0.0 0.0 - 0.1 K/uL  Comprehensive metabolic panel     Status: Abnormal   Collection Time: 06/06/2017 10:14 AM  Result Value Ref Range   Sodium 139 135 - 145 mmol/L   Potassium 4.6 3.5 - 5.1 mmol/L   Chloride 88 (L) 101 - 111 mmol/L   CO2 27 22 - 32 mmol/L   Glucose, Bld 282 (H) 65 - 99 mg/dL   BUN 44 (H) 6 - 20 mg/dL   Creatinine, Ser 11.71 (H) 0.61 - 1.24 mg/dL   Calcium 9.8 8.9 - 10.3 mg/dL  Total Protein 9.3 (H) 6.5 - 8.1 g/dL   Albumin 4.2 3.5 - 5.0 g/dL   AST 23 15 - 41 U/L   ALT 12 (L) 17 - 63 U/L   Alkaline Phosphatase 56  38 - 126 U/L   Total Bilirubin 0.9 0.3 - 1.2 mg/dL   GFR calc non Af Amer 4 (L) >60 mL/min   GFR calc Af Amer 4 (L) >60 mL/min    Comment: (NOTE) The eGFR has been calculated using the CKD EPI equation. This calculation has not been validated in all clinical situations. eGFR's persistently <60 mL/min signify possible Chronic Kidney Disease.    Anion gap 24 (H) 5 - 15  Lipase, blood     Status: None   Collection Time: 06/14/2017 10:14 AM  Result Value Ref Range   Lipase 47 11 - 51 U/L  I-stat troponin, ED     Status: None   Collection Time: 06/01/2017 10:30 AM  Result Value Ref Range   Troponin i, poc 0.02 0.00 - 0.08 ng/mL   Comment 3            Comment: Due to the release kinetics of cTnI, a negative result within the first hours of the onset of symptoms does not rule out myocardial infarction with certainty. If myocardial infarction is still suspected, repeat the test at appropriate intervals.   I-stat chem 8, ed     Status: Abnormal   Collection Time: 06/08/2017 10:47 AM  Result Value Ref Range   Sodium 137 135 - 145 mmol/L   Potassium 4.5 3.5 - 5.1 mmol/L   Chloride 91 (L) 101 - 111 mmol/L   BUN 51 (H) 6 - 20 mg/dL   Creatinine, Ser 23.57 (H) 0.61 - 1.24 mg/dL   Glucose, Bld 502 (H) 65 - 99 mg/dL   Calcium, Ion 6.80 (LL) 1.15 - 1.40 mmol/L   TCO2 34 (H) 22 - 32 mmol/L   Hemoglobin 18.4 (H) 13.0 - 17.0 g/dL   HCT 61.4 (H) 14.9 - 24.3 %   Comment NOTIFIED PHYSICIAN   CBG monitoring, ED     Status: Abnormal   Collection Time: 05/31/2017  3:33 PM  Result Value Ref Range   Glucose-Capillary 203 (H) 65 - 99 mg/dL  Troponin I-serum (0, 3, 6 hours)     Status: Abnormal   Collection Time: 06/10/2017  6:46 PM  Result Value Ref Range   Troponin I 0.09 (HH) <0.03 ng/mL    Comment: CRITICAL RESULT CALLED TO, READ BACK BY AND VERIFIED WITH: RN Etheleen Mayhew AT 2009 [id number] MARTINB   MRSA PCR Screening     Status: None   Collection Time: 06/17/2017  6:55 PM  Result Value Ref Range    MRSA by PCR NEGATIVE NEGATIVE    Comment:        The GeneXpert MRSA Assay (FDA approved for NASAL specimens only), is one component of a comprehensive MRSA colonization surveillance program. It is not intended to diagnose MRSA infection nor to guide or monitor treatment for MRSA infections.   Troponin I-serum (0, 3, 6 hours)     Status: Abnormal   Collection Time: 06/04/2017  9:36 PM  Result Value Ref Range   Troponin I 0.08 (HH) <0.03 ng/mL    Comment: CRITICAL VALUE NOTED.  VALUE IS CONSISTENT WITH PREVIOUSLY REPORTED AND CALLED VALUE.  Glucose, capillary     Status: Abnormal   Collection Time: 06/06/2017  9:41 PM  Result Value Ref Range   Glucose-Capillary 213 (H)  65 - 99 mg/dL  Troponin I     Status: Abnormal   Collection Time: 06/08/17 12:17 AM  Result Value Ref Range   Troponin I 0.08 (HH) <0.03 ng/mL    Comment: CRITICAL VALUE NOTED.  VALUE IS CONSISTENT WITH PREVIOUSLY REPORTED AND CALLED VALUE.  Glucose, capillary     Status: Abnormal   Collection Time: 06/08/17  8:04 AM  Result Value Ref Range   Glucose-Capillary 118 (H) 65 - 99 mg/dL   .  ROS: as in hpi  Physical Exam: Vitals:   06/08/17 0806 06/08/17 0912  BP: 129/83 (!) 144/79  Pulse: 99   Resp: 18   Temp: (!) 97.5 F (36.4 C)   SpO2: 100%      General: alert thin chronically ill AAM , nad , Oriented now to person and place only HEENT: [place] ,EOMI  DM retinal dz. Neck: no jvd, supple PCL Heart: RRR 1/6 sem lsb , no rub or gallop  Lungs: CTA , no labored breathing expir wheezing , decreased bs Abdomen: BS pos ^ ,soft NT, ND Liver down 4 cm Extremities: R BKA no edema /healed stump/ no L pedal edema Retracted area on R stump Skin: L lower Etrm. Dry scaling skin with sore on planter surface of L heel and on L medial mallelous  no erythema, drainage.  Neuro: alert Disoreinted to date only now/ no acute focal deficits appreciated  Dialysis Access: R IJ perm cath  NT /   Dialysis Orders: Center: [facility]  [place]  on   MonWedFri, 4 hrs 0 min, 180NRe BFR 400, DFR Manual 800 mL/min, EDW 61 (kg), Dialysate 2.0 K, 2.25 Ca,  Access:  R IJ  PermCatheter- ( REfusing any other access)_ Vitamin D (Calcitriol) Oral 0.75 mcg  Venofer '50mg'$  wkly hd   Assessment/Plan  1. ESRD - HD  MWF / ESRD Pt can  dc lasix Vol xs 2. Chest pain -card wu / Med tx for now 3. Hypertension/volume  - HD and home meds  Was on IV NTG Drip in er /Lower bp and meds. 4. Anemia  - HGB 15.4 no esa needs/ fe weekly wed hd COPD 5. Metabolic bone disease -  On po Vit d on HD / binder= Renvela , sensipar  6. Nutrition - Rena/carb diet / renal vit  7. GERD- Per Admit  8. PVD- R BKA 9. Tobacco Abuse -"1 PPD " 10. HLD on zocor  Gerald Haber, PA-C [place] [phone] 06/08/2017, 9:28 AM  I have seen and examined this patient and agree with the plan of care seen ,eval, examined, discussed with patient and PA, changes made. .  Delshon Blanchfield L 06/08/2017, 11:55 AM

## 2017-06-08 NOTE — Progress Notes (Signed)
PROGRESS NOTE    Gerald Davenport  QMG:[id number] DOB: Jan 12, 1941 DOA: 06/08/2017 PCP: Alroy Dust, L.Marlou Sa, MD    Brief Narrative:  76 yo male who presented for chest pain. Patient is known to have end-stage renal disease on hemodialysis, type 2 diabetes mellitus, hypertension, dyslipidemia, and peripheral vascular disease. He developed severe substernal chest pain, without radiation, improving with sublingual nitroglycerin, by the time he reached the emergency department he was chest pain-free. On his initial physical examination blood pressure 204/94, heart rate 105, temperature 97.8, respiratory 20, oxygen saturation 99%. Moist mucous membranes, heart S1-S2 present and rhythmic, rales, rubs or murmurs, lungs were clear to auscultation bilaterally, no wheezing, rales or rhonchi, abdomen was soft, nontender, nondistended, no lower extremity edema. Sodium 137, potassium 4.5, chloride 91, glucose 21, BUN 51, creatinine 11.5, troponin 0.09, white count 13.9, hemoglobin 15.4, hematocrit 45.9, platelets 235, chest x-ray with right subclavian hemodialysis catheter in place, no effusions, infiltrates or signs of pneumothorax. EKG sinus rhythm, rate 111 bpm, positive S2 depressions in the inferior lateral leads, more pronounced that baseline.  Patient admitted to the hospital with the working diagnosis of chest pain, to rule out non-ST elevation myocardial infarction.   Assessment & Plan:   Principal Problem:   Chest pain Active Problems:   Type 2 diabetes mellitus with ESRD (end-stage renal disease) (HCC)   GERD (gastroesophageal reflux disease)   T wave inversion in EKG   ESRD on dialysis [facility])   Hypertensive urgency  1. Chest pain. EKG with worsening ST depressions on inferior-lateral leads, no chest pain and troponins are flat at 0.08. Lo pretest probability for acute coronary syndrome, will continue medical care, wean off nitroglycerin drip, follow on echocardiogram and cardiology recommendations. Will  continue antiplatelet therapy with asa and clopidogrel, statin with simvastatin.    2. End-stage renal disease on hemodialysis. Nephrology contacted, patient will have hemodialysis today, follow on renal pane in am. No signs of volume overload. Will continue sevelamer, cinacalcet, phoslo, calcitriol. Serum calcium at 9,8, check phosphorus in am.   3. Uncontrolled hypertension. Systolic blood pressure at 180, patient will have hd with ultrafiltration today, will continue coreg, and clonidine. Added isosorbide and hydralazine.   4. Type 2 diabetes mellitus. Will continue glucose cover and monitoring with insulin sliding scale, patient tolerating po well, capillary glucose at 203, 213, 118, 190. Basal insulin therapy with levimir 10 units qhs.   5. GERD. Continue antiacid therapy with pantoprazole.   6. Depression. Continue bupropion and seroquel.    DVT prophylaxis: heparin  Code Status: Full Family Communication:  Disposition Plan: home   Consultants:   Cardiology   Nephrology  Procedures:     Antimicrobials:       Subjective: Patient chest pain free, does not recall the events associated with chest pain, apparently it happen at rest, with no associated symptoms. Patient has a right BKA.   Objective: Vitals:   06/08/17 0331 06/08/17 0439 06/08/17 0547 06/08/17 0646  BP: 140/75 125/69 104/67 135/77  Pulse: 90 92 92 94  Resp: 14 12 15 20   Temp: (!) 97.4 F (36.3 C)     TempSrc: Axillary     SpO2: 100% 99% 100% 100%  Weight: 57.7 kg (127 lb 3.2 oz)     Height:        Intake/Output Summary (Last 24 hours) at 06/08/17 0756 Last data filed at 06/08/17 0636  Gross per 24 hour  Intake           315.58 ml  Output                0 ml  Net           315.58 ml   Filed Weights   06/24/2017 1845 06/08/17 0331  Weight: 57.2 kg (126 lb 3.2 oz) 57.7 kg (127 lb 3.2 oz)    Examination:  General: Not in pain or dyspnea Neurology: Awake and alert, non focal  E ENT: no  pallor, no icterus, oral mucosa moist Cardiovascular: S1-S2 present, rhythmic, no gallops, rubs, or murmurs. No jugular venous distention, no lower extremity edema. Pulmonary: scattered rhonchi and wheezing bilaterally, adequate air movement, no rales. Gastrointestinal. Abdomen flat, no organomegaly, non tender, no rebound or guarding Skin. No rashes Musculoskeletal: no joint deformities. Right BKA.      Data Reviewed: I have personally reviewed following labs and imaging studies  CBC:  Recent Labs Lab 06/26/2017 1014 06/12/2017 1047  WBC 13.9*  --   NEUTROABS 12.2*  --   HGB 15.4 18.4*  HCT 45.9 54.0*  MCV 106.7*  --   PLT 235  --    Basic Metabolic Panel:  Recent Labs Lab 06/12/2017 1014 06/20/2017 1047  NA 139 137  K 4.6 4.5  CL 88* 91*  CO2 27  --   GLUCOSE 282* 281*  BUN 44* 51*  CREATININE 11.71* 11.50*  CALCIUM 9.8  --    GFR: Estimated Creatinine Clearance: 4.3 mL/min (A) (by C-G formula based on SCr of 11.5 mg/dL (H)). Liver Function Tests:  Recent Labs Lab 06/27/2017 1014  AST 23  ALT 12*  ALKPHOS 56  BILITOT 0.9  PROT 9.3*  ALBUMIN 4.2    Recent Labs Lab 06/11/2017 1014  LIPASE 47   No results for input(s): AMMONIA in the last 168 hours. Coagulation Profile: No results for input(s): INR, PROTIME in the last 168 hours. Cardiac Enzymes:  Recent Labs Lab 06/06/2017 1846 06/23/2017 2136 06/08/17 0017  TROPONINI 0.09* 0.08* 0.08*   BNP (last 3 results) No results for input(s): PROBNP in the last 8760 hours. HbA1C: No results for input(s): HGBA1C in the last 72 hours. CBG:  Recent Labs Lab 06/15/2017 1533 06/28/2017 2141  GLUCAP 203* 213*   Lipid Profile: No results for input(s): CHOL, HDL, LDLCALC, TRIG, CHOLHDL, LDLDIRECT in the last 72 hours. Thyroid Function Tests: No results for input(s): TSH, T4TOTAL, FREET4, T3FREE, THYROIDAB in the last 72 hours. Anemia Panel: No results for input(s): VITAMINB12, FOLATE, FERRITIN, TIBC, IRON,  RETICCTPCT in the last 72 hours.    Radiology Studies: I have reviewed all of the imaging during this hospital visit personally     Scheduled Meds: . aspirin EC  325 mg Oral Daily  . buPROPion  150 mg Oral Daily  . calcium acetate  667-2,001 mg Oral TID WC  . carvedilol  6.25 mg Oral BID WC  . cinacalcet  30 mg Oral Q breakfast  . cloNIDine  0.2 mg Oral TID  . clopidogrel  75 mg Oral Daily  . doxazosin  8 mg Oral Daily  . feeding supplement (NEPRO CARB STEADY)  237 mL Oral BID BM  . fluorometholone  1 drop Both Eyes QHS  . furosemide  40 mg Oral BID  . gabapentin  100 mg Oral BID  . heparin  5,000 Units Subcutaneous Q8H  . insulin aspart  0-5 Units Subcutaneous QHS  . insulin aspart  0-9 Units Subcutaneous TID WC  . insulin detemir  10 Units Subcutaneous QHS  . linaclotide  145 mcg Oral QAC breakfast  . linagliptin  5 mg Oral Daily  . pantoprazole  40 mg Oral Daily  . polyvinyl alcohol  1 drop Both Eyes QHS  . QUEtiapine  200 mg Oral QHS  . sevelamer carbonate  800 mg Oral TID WC  . simvastatin  20 mg Oral QHS   Continuous Infusions: . nitroGLYCERIN 30 mcg/min (06/08/17 0636)     LOS: 1 day        Mauricio Gerome Apley, MD [facility] Pager [phone]

## 2017-06-08 NOTE — Progress Notes (Addendum)
Progress Note  Patient Name: Gerald Davenport Date of Encounter: 06/08/2017  Primary Cardiologist: Dr. Radford Pax  Subjective   Denies any chest pain but remains confused.  He thinks he is at [place], that it is Sunday and is 2019  Inpatient Medications    Scheduled Meds: . aspirin EC  325 mg Oral Daily  . buPROPion  150 mg Oral Daily  . calcium acetate  667-2,001 mg Oral TID WC  . carvedilol  6.25 mg Oral BID WC  . cinacalcet  30 mg Oral Q breakfast  . cloNIDine  0.2 mg Oral TID  . clopidogrel  75 mg Oral Daily  . doxazosin  8 mg Oral Daily  . feeding supplement (NEPRO CARB STEADY)  237 mL Oral BID BM  . fluorometholone  1 drop Both Eyes QHS  . furosemide  40 mg Oral BID  . gabapentin  100 mg Oral BID  . heparin  5,000 Units Subcutaneous Q8H  . insulin aspart  0-5 Units Subcutaneous QHS  . insulin aspart  0-9 Units Subcutaneous TID WC  . insulin detemir  10 Units Subcutaneous QHS  . linaclotide  145 mcg Oral QAC breakfast  . linagliptin  5 mg Oral Daily  . pantoprazole  40 mg Oral Daily  . polyvinyl alcohol  1 drop Both Eyes QHS  . QUEtiapine  200 mg Oral QHS  . sevelamer carbonate  800 mg Oral TID WC  . simvastatin  20 mg Oral QHS   Continuous Infusions: . nitroGLYCERIN 30 mcg/min (06/08/17 0636)   PRN Meds: acetaminophen, gi cocktail, ondansetron (ZOFRAN) IV, oxyCODONE, psyllium   Vital Signs    Vitals:   06/08/17 0439 06/08/17 0547 06/08/17 0646 06/08/17 0806  BP: 125/69 104/67 135/77 129/83  Pulse: 92 92 94 99  Resp: 12 15 20 18  Temp:    (!) 97.5 F (36.4 C)  TempSrc:    Axillary  SpO2: 99% 100% 100% 100%  Weight:      Height:        Intake/Output Summary (Last 24 hours) at 06/08/17 0833 Last data filed at 06/08/17 0636  Gross per 24 hour  Intake           315.58 ml  Output                0 ml  Net           31 5.58 ml   Filed Weights   06/19/2017 1845 06/08/17 0331  Weight: 126 lb 3.2 oz (57.2 kg) 127 lb 3.2 oz (57.7 kg)    Telemetry    NSR -  Personally Reviewed  ECG    No new EKG to review - Personally Reviewed  Physical Exam   GEN: No acute distress.   Neck: No JVD Cardiac: RRR, no murmurs, rubs, or gallops.  Respiratory: Clear to auscultation bilaterally. GI: Soft, nontender, non-distended  MS: No edema; No deformity. Neuro:  Nonfocal  Psych: Normal affect   Labs    Chemistry Recent Labs Lab 06/24/2017 1014 06/02/2017 1047  NA 139 137  K 4.6 4.5  CL 88* 91*  CO2 27  --   GLUCOSE 282* 281*  BUN 44* 51*  CREATININE 11.71* 11.50*  CALCIUM 9.8  --   PROT 9.3*  --   ALBUMIN 4.2  --   AST 23  --   ALT 12*  --   ALKPHOS 56  --   BILITOT 0.9  --   GFRNONAA 4*  --  GFRAA 4*  --   ANIONGAP 24*  --      Hematology Recent Labs Lab 06/06/2017 1014 06/08/2017 1047  WBC 13.9*  --   RBC 4.30  --   HGB 15.4 18.4*  HCT 45.9 54.0*  MCV 106.7*  --   MCH 35.8*  --   MCHC 33.6  --   RDW 17.0*  --   PLT 235  --     Cardiac Enzymes Recent Labs Lab 06/22/2017 1846 06/28/2017 2136 06/08/17 0017  TROPONINI 0.09* 0.08* 0.08*    Recent Labs Lab 06/23/2017 1030  TROPIPOC 0.02     BNPNo results for input(s): BNP, PROBNP in the last 168 hours.   DDimer No results for input(s): DDIMER in the last 168 hours.   Radiology    Dg Chest 2 View  Result Date: 06/06/2017 CLINICAL DATA:  Chest pain, nausea and vomiting. Coffee ground emesis per family. On dialysis. EXAM: CHEST  2 VIEW COMPARISON:  Chest x-ray dated 01/23/2017. FINDINGS: Cardiomediastinal silhouette is within normal limits. Atherosclerotic changes noted at the aortic arch. Lungs are clear. No pleural effusion or pneumothorax seen. Right-sided dialysis catheter appears appropriately position. No acute or suspicious osseous finding. IMPRESSION: No active cardiopulmonary disease. No evidence of pneumonia or pulmonary edema. Aortic atherosclerosis. Electronically Signed   By: Franki Cabot M.D.   On: 06/10/2017 10:32    Cardiac Studies   none  Patient Profile      76 y.o. male with a hx of HTN, HLD, DM and ESRD on HD and was seen by Dr. Radford Pax in 2015 for SOB with Dialysis, his nuc was normal and Echo with normal EF and mildly leaky AV.  He is RBKA for PAD and ulcer.  He has dialysis MWF. Initially  he had nausea after getting hot and also complained of chest pain and SOB. These symptoms would come and go but by Sat night he was vomiting- brown liquid.  The chest pain and SOB also come and go.  His wife notes that he is confused at home at times.  Here he is pulling off clothes at times.  He complains of severe pain and then no pain.    Assessment & Plan    1. Chest pain with abnormal EKG concerning for ischemia but could also be due to subendocardial ischemia from HTN urgency.   - Trop mildly elevated but flat trend and likely related to demand ischemia in the setting of HTN urgency as well as ESRD. - wean off IV NTG gtt and change to Imdur 30mg  daily.  - change ASA to 81mg  daily and continue Plavix 75mg  daily. - continue carvedilol 6.25mg  BID - continue statin - will try to manage medically with confusion for now.   - 2D echo pending to assess LVF - if normal then for now would pursue medical management as he is very confused and does not remember having any chest pain.    2. HTN  - May have been increased with aggitation. - BP much better today. - continue carvedilol 6.25mg  BID, Catapres 0.2mg  TID, doxazosin 8mg  daily.  3. HLD  - continue statin - check FLP  4.  ESRD on HD - per renal  5.  DM  - per IM  6.  Tobacco use 1 ppd  - needs to stop - we discussed.    For questions or updates, please contact [place] Please consult [email or web address] for contact info under Cardiology/STEMI. Daytime calls, contact the Day Call APP (6a-8a)  or assigned team (Teams A-D) provider (7:30a - 5p). All other daytime calls (7:30-5p), contact the Card Master @ [phone].   Nighttime calls, contact the assigned APP (5p-8p) or MD  (6:30p-8p). Overnight calls (8p-6a), contact the on call Fellow @ [phone].      Signed, Fransico Him, MD  06/08/2017, 8:33 AM

## 2017-06-09 ENCOUNTER — Inpatient Hospital Stay (HOSPITAL_COMMUNITY): Payer: Medicare Other

## 2017-06-09 DIAGNOSIS — I5030 Unspecified diastolic (congestive) heart failure: Secondary | ICD-10-CM

## 2017-06-09 LAB — BASIC METABOLIC PANEL
Anion gap: 17 — ABNORMAL HIGH (ref 5–15)
BUN: 38 mg/dL — ABNORMAL HIGH (ref 6–20)
CO2: 24 mmol/L (ref 22–32)
Calcium: 8.3 mg/dL — ABNORMAL LOW (ref 8.9–10.3)
Chloride: 92 mmol/L — ABNORMAL LOW (ref 101–111)
Creatinine, Ser: 8.49 mg/dL — ABNORMAL HIGH (ref 0.61–1.24)
GFR calc Af Amer: 6 mL/min — ABNORMAL LOW (ref >60)
GFR calc non Af Amer: 5 mL/min — ABNORMAL LOW (ref >60)
Glucose, Bld: 63 mg/dL — ABNORMAL LOW (ref 65–99)
Potassium: 4.6 mmol/L (ref 3.5–5.1)
Sodium: 133 mmol/L — ABNORMAL LOW (ref 135–145)

## 2017-06-09 LAB — CBC WITH DIFFERENTIAL/PLATELET
BASOS ABS: 0 10*3/uL (ref 0.0–0.1)
BASOS PCT: 0 %
EOS ABS: 0 10*3/uL (ref 0.0–0.7)
EOS PCT: 0 %
HCT: 44.1 % (ref 39.0–52.0)
Hemoglobin: 15 g/dL (ref 13.0–17.0)
LYMPHS ABS: 1 10*3/uL (ref 0.7–4.0)
LYMPHS PCT: 7 %
MCH: 36.2 pg — ABNORMAL HIGH (ref 26.0–34.0)
MCHC: 34 g/dL (ref 30.0–36.0)
MCV: 106.5 fL — ABNORMAL HIGH (ref 78.0–100.0)
MONO ABS: 1.5 10*3/uL — AB (ref 0.1–1.0)
Monocytes Relative: 11 %
NEUTROS ABS: 11.1 10*3/uL — AB (ref 1.7–7.7)
Neutrophils Relative %: 82 %
Platelets: 197 10*3/uL (ref 150–400)
RBC: 4.14 MIL/uL — AB (ref 4.22–5.81)
RDW: 17.4 % — AB (ref 11.5–15.5)
WBC: 13.6 10*3/uL — AB (ref 4.0–10.5)

## 2017-06-09 LAB — LIPID PANEL
CHOL/HDL RATIO: 2.4 ratio
Cholesterol: 171 mg/dL (ref 0–200)
HDL: 72 mg/dL (ref >40)
LDL CALC: 64 mg/dL (ref 0–99)
Triglycerides: 174 mg/dL — ABNORMAL HIGH (ref <150)
VLDL: 35 mg/dL (ref 0–40)

## 2017-06-09 LAB — COMPREHENSIVE METABOLIC PANEL
ALBUMIN: 4 g/dL (ref 3.5–5.0)
ALT: 13 U/L — ABNORMAL LOW (ref 17–63)
AST: 25 U/L (ref 15–41)
Alkaline Phosphatase: 56 U/L (ref 38–126)
Anion gap: 21 — ABNORMAL HIGH (ref 5–15)
BUN: 50 mg/dL — AB (ref 6–20)
CHLORIDE: 91 mmol/L — AB (ref 101–111)
CO2: 20 mmol/L — ABNORMAL LOW (ref 22–32)
Calcium: 8.3 mg/dL — ABNORMAL LOW (ref 8.9–10.3)
Creatinine, Ser: 9.97 mg/dL — ABNORMAL HIGH (ref 0.61–1.24)
GFR calc Af Amer: 5 mL/min — ABNORMAL LOW (ref >60)
GFR, EST NON AFRICAN AMERICAN: 4 mL/min — AB (ref >60)
Glucose, Bld: 140 mg/dL — ABNORMAL HIGH (ref 65–99)
POTASSIUM: 5.3 mmol/L — AB (ref 3.5–5.1)
Sodium: 132 mmol/L — ABNORMAL LOW (ref 135–145)
Total Bilirubin: UNDETERMINED mg/dL (ref 0.3–1.2)
Total Protein: 8.8 g/dL — ABNORMAL HIGH (ref 6.5–8.1)

## 2017-06-09 LAB — GLUCOSE, CAPILLARY
GLUCOSE-CAPILLARY: 85 mg/dL (ref 65–99)
GLUCOSE-CAPILLARY: 130 mg/dL — AB (ref 65–99)
GLUCOSE-CAPILLARY: 146 mg/dL — AB (ref 65–99)
GLUCOSE-CAPILLARY: 170 mg/dL — AB (ref 65–99)
GLUCOSE-CAPILLARY: 133 mg/dL — AB (ref 65–99)

## 2017-06-09 LAB — ECHOCARDIOGRAM COMPLETE
HEIGHTINCHES: 68 in
Weight: 2014.12 oz

## 2017-06-09 LAB — PHOSPHORUS: Phosphorus: 5.9 mg/dL — ABNORMAL HIGH (ref 2.5–4.6)

## 2017-06-09 MED ORDER — CLONIDINE HCL 0.1 MG PO TABS
0.1000 mg | ORAL_TABLET | Freq: Two times a day (BID) | ORAL | Status: DC
  Administered 2017-06-09: 0.1 mg via ORAL
  Filled 2017-06-09: qty 1

## 2017-06-09 MED ORDER — DOCUSATE SODIUM 100 MG PO CAPS
100.0000 mg | ORAL_CAPSULE | Freq: Two times a day (BID) | ORAL | Status: DC
  Administered 2017-06-09 – 2017-06-10 (×4): 100 mg via ORAL
  Filled 2017-06-09 (×4): qty 1

## 2017-06-09 MED ORDER — PERFLUTREN LIPID MICROSPHERE
1.0000 mL | INTRAVENOUS | Status: AC | PRN
  Administered 2017-06-09: 2 mL via INTRAVENOUS
  Filled 2017-06-09: qty 10

## 2017-06-09 MED ORDER — HYDRALAZINE HCL 25 MG PO TABS
25.0000 mg | ORAL_TABLET | Freq: Two times a day (BID) | ORAL | Status: DC
  Administered 2017-06-09 – 2017-06-11 (×3): 25 mg via ORAL
  Filled 2017-06-09 (×3): qty 1

## 2017-06-09 MED ORDER — SORBITOL 70 % SOLN
30.0000 mL | Freq: Three times a day (TID) | Status: DC | PRN
  Administered 2017-06-09: 30 mL via ORAL
  Filled 2017-06-09 (×2): qty 30

## 2017-06-09 MED ORDER — PERFLUTREN LIPID MICROSPHERE
INTRAVENOUS | Status: AC
  Filled 2017-06-09: qty 10

## 2017-06-09 MED ORDER — POLYETHYLENE GLYCOL 3350 17 G PO PACK
17.0000 g | PACK | Freq: Once | ORAL | Status: AC
  Administered 2017-06-09: 17 g via ORAL
  Filled 2017-06-09: qty 1

## 2017-06-09 NOTE — Progress Notes (Signed)
  Echocardiogram 2D Echocardiogram with Definity has been performed.  Tresa Res 06/09/2017, 4:04 PM

## 2017-06-09 NOTE — Plan of Care (Signed)
Problem: Health Behavior/Discharge Planning: Goal: Ability to manage health-related needs will improve Outcome: Not Progressing Requires reinforcement of prescribed therapeutic regimen and importance of complying with prescribed regimen

## 2017-06-09 NOTE — Progress Notes (Addendum)
Progress Note  Patient Name: Gerald Davenport Date of Encounter: 06/09/2017  Primary Cardiologist: Dr. Radford Pax  Subjective   Continues to denies that he ever had any CP.  He complains of lower abdominal pain and nausea this am  Inpatient Medications    Scheduled Meds: . aspirin EC  81 mg Oral Daily  . buPROPion  150 mg Oral Daily  . calcitRIOL  0.75 mcg Oral Q M,W,F-HD  . calcium acetate  667-2,001 mg Oral TID WC  . carvedilol  6.25 mg Oral BID WC  . cinacalcet  30 mg Oral Q breakfast  . cloNIDine  0.2 mg Oral BID  . clopidogrel  75 mg Oral Daily  . doxazosin  8 mg Oral Daily  . feeding supplement (NEPRO CARB STEADY)  237 mL Oral BID BM  . fluorometholone  1 drop Both Eyes QHS  . gabapentin  100 mg Oral BID  . heparin  5,000 Units Subcutaneous Q8H  . hydrALAZINE  50 mg Oral BID  . insulin aspart  0-5 Units Subcutaneous QHS  . insulin aspart  0-9 Units Subcutaneous TID WC  . insulin detemir  10 Units Subcutaneous QHS  . isosorbide mononitrate  30 mg Oral Daily  . linaclotide  145 mcg Oral QAC breakfast  . multivitamin  1 tablet Oral QHS  . pantoprazole  40 mg Oral Daily  . polyvinyl alcohol  1 drop Both Eyes QHS  . QUEtiapine  200 mg Oral QHS  . sevelamer carbonate  800 mg Oral TID WC  . simvastatin  20 mg Oral QHS   Continuous Infusions: . [START ON 06/10/2017] ferric gluconate (FERRLECIT/NULECIT) IV     PRN Meds: acetaminophen, gi cocktail, ondansetron (ZOFRAN) IV, oxyCODONE, psyllium   Vital Signs    Vitals:   06/08/17 1832 06/08/17 2020 06/09/17 0011 06/09/17 0332  BP:  124/82 (!) 99/55 (!) 91/56  Pulse: 91 91 90 86  Resp:  18 15 15   Temp:  97.6 F (36.4 C) 97.9 F (36.6 C) 98 F (36.7 C)  TempSrc:  Oral Oral Oral  SpO2:  98% 99% 99%  Weight:    125 lb 14.1 oz (57.1 kg)  Height:        Intake/Output Summary (Last 24 hours) at 06/09/17 0750 Last data filed at 06/08/17 2200  Gross per 24 hour  Intake            142.8 ml  Output              687 ml  Net            -544.2 ml   Filed Weights   06/08/17 1300 06/08/17 1711 06/09/17 0332  Weight: 128 lb 8.5 oz (58.3 kg) 126 lb 12.2 oz (57.5 kg) 125 lb 14.1 oz (57.1 kg)    Telemetry    NSR - Personally Reviewed  ECG    No new EKG to review - Personally Reviewed  Physical Exam   GEN: thin frail in NAD Neck: no JVD Cardiac: RRR with no M/R/G Respiratory: CTA GI: soft, NT, ND  MS: no edema Neuro:  alert but confused Psych:normal affect  Labs    Chemistry  Recent Labs Lab 06/14/2017 1014 06/02/2017 1047 06/08/17 1407 06/09/17 0430  NA 139 137 138 133*  K 4.6 4.5 4.7 4.6  CL 88* 91* 86* 92*  CO2 27  --  31 24  GLUCOSE 282* 281* 152* 63*  BUN 44* 51* 69* 38*  CREATININE 11.71* 11.50* 14.18*  8.49*  CALCIUM 9.8  --  9.1 8.3*  PROT 9.3*  --   --   --   ALBUMIN 4.2  --  4.0  --   AST 23  --   --   --   ALT 12*  --   --   --   ALKPHOS 56  --   --   --   BILITOT 0.9  --   --   --   GFRNONAA 4*  --  3* 5*  GFRAA 4*  --  3* 6*  ANIONGAP 24*  --  21* 17*     Hematology  Recent Labs Lab 06/02/2017 1014 06/09/2017 1047  WBC 13.9*  --   RBC 4.30  --   HGB 15.4 18.4*  HCT 45.9 54.0*  MCV 106.7*  --   MCH 35.8*  --   MCHC 33.6  --   RDW 17.0*  --   PLT 235  --     Cardiac Enzymes  Recent Labs Lab 06/09/2017 1846 06/27/2017 2136 06/08/17 0017  TROPONINI 0.09* 0.08* 0.08*     Recent Labs Lab 06/11/2017 1030  TROPIPOC 0.02     BNPNo results for input(s): BNP, PROBNP in the last 168 hours.   DDimer No results for input(s): DDIMER in the last 168 hours.   Radiology    Dg Chest 2 View  Result Date: 06/27/2017 CLINICAL DATA:  Chest pain, nausea and vomiting. Coffee ground emesis per family. On dialysis. EXAM: CHEST  2 VIEW COMPARISON:  Chest x-ray dated 01/23/2017. FINDINGS: Cardiomediastinal silhouette is within normal limits. Atherosclerotic changes noted at the aortic arch. Lungs are clear. No pleural effusion or pneumothorax seen. Right-sided dialysis catheter  appears appropriately position. No acute or suspicious osseous finding. IMPRESSION: No active cardiopulmonary disease. No evidence of pneumonia or pulmonary edema. Aortic atherosclerosis. Electronically Signed   By: Franki Cabot M.D.   On: 06/19/2017 10:32    Cardiac Studies   none  Patient Profile     76 y.o. male with a hx of HTN, HLD, DM and ESRD on HD and was seen by Dr. Radford Pax in 2015 for SOB with Dialysis, his nuc was normal and Echo with normal EF and mildly leaky AV.  He is RBKA for PAD and ulcer.  He has dialysis MWF. Initially  he had nausea after getting hot and also complained of chest pain and SOB. These symptoms would come and go but by Sat night he was vomiting- brown liquid.  The chest pain and SOB also come and go.  His wife notes that he is confused at home at times.  Here he is pulling off clothes at times.  He complains of severe pain and then no pain.    Assessment & Plan    1. Chest pain with abnormal EKG concerning for ischemia but could also be due to subendocardial ischemia from HTN urgency.   - Trop mildly elevated but flat trend and likely related to demand ischemia in the setting of HTN urgency as well as ESRD. - started Imdur 30mg  daily yesterday and no complaints of CP.  - continue  ASA 81mg  daily and Plavix 75mg  daily. - continue carvedilol 6.25mg  BID - continue statin - will try to manage medically with confusion for now.   - Await 2D echo  to assess LVF - if normal then for now would pursue medical management as he is very confused and does not remember having any chest pain.    2.  HTN  - May have been increased with aggitation. - BP soft this am - continue carvedilol 6.25mg  BID, Catapres 0.2mg  TID, doxazosin 8mg  daily.  3. HLD  - continue statin - check FLP  4.  ESRD on HD - per renal  5.  DM  - per IM  6.  Tobacco use 1 ppd  - needs to stop - we discussed.    For questions or updates, please contact [place] Please consult  [email or web address] for contact info under Cardiology/STEMI. Daytime calls, contact the Day Call APP (6a-8a) or assigned team (Teams A-D) provider (7:30a - 5p). All other daytime calls (7:30-5p), contact the Card Master @ [phone].   Nighttime calls, contact the assigned APP (5p-8p) or MD (6:30p-8p). Overnight calls (8p-6a), contact the on call Fellow @ [phone].      Signed, Fransico Him, MD  06/09/2017, 7:50 AM

## 2017-06-09 NOTE — Progress Notes (Signed)
PROGRESS NOTE [facility]   TRENTIN KNAPPENBERGER   SWN:[id number] DOB: [birth year]  DOA: 06/24/2017 PCP: Alroy Dust, L.Marlou Sa, MD   Brief Narrative:  Gerald Davenport  Is a 76 year old male who presented for chest pain. Patient is noted to have end-stage renal disease on hemodialysis, type 2 diabetes mellitus, hypertension, hyperlipidemia and peripheral vascular disease. Patient developed severe substernal chest pain which improved with nitroglycerin. Upon ED evaluation he was found systolic blood pressure > 200's and tachycardia up to 110. EKG with sinus rhythm and ST depressions in the inferior lateral leads, more pronounced from baseline. Patient was admitted to the hospital for working diagnosis was chest pain to rule out ACS and cardiology was consulted.   Subjective: Patient seen and examined, complaining of abdominal pain. No bowel movement in the past 3 days. Denies any chest pain, chest of breath, palpitation and weakness. Around 2 PM nurse report the patient was lethargic but arousable. Temperature was taken and was low.  Assessment & Plan: Chest pain rule out ACS EKG with worsening ST depression on inferior lateral leads, chest pain-free at this point. Troponin flat at 0.08. Cardiology consulted and recommendations appreciated. Patient is awaiting echocardiogram for further recommendations. Will continue antiplatelet therapy with aspirin and clopidogrel for now. Nitroglycerin when necessary and statin.   End-stage renal disease on hemodialysis Nephrology was consulted and hemodialysis has been scheduled Monitor renal panel in the morning. Management per renal   Uncontrolled hypertension  Improved after hemodialysis, nephrology has discontinue clonidine as patient is not compliant with this medication. Blood pressures now soft. Continue to monitor patient has been started on isosorbide and hydralazine.Will continue with Coreg.  Type 2 diabetes mellitus CBG is stable Continue Levemir 10 units  daily at bedtime SSI and monitor CBGs.  Abdominal pain Likely secondary to constipation Abdominal x-ray negative for obstruction MiraLAX added Patient with some hyperthermia will add blood cultures and lab work up  DVT prophylaxis: heparin Code Status: full code Family Communication: none at bedside Disposition Plan: home in the next 24-48 hours, if echocardiogram negative   Consultants:   Cardiology  Nephrology  Procedures:   Hemodialysis on 06/08/2017  Antimicrobials: Anti-infectives    None       Objective: Vitals:   06/09/17 0905 06/09/17 1327 06/09/17 1333 06/09/17 1337  BP: (!) 107/55 (!) 92/49 (!) 102/58   Pulse:  92    Resp:  18    Temp:    (!) 96.5 F (35.8 C)  TempSrc:    Rectal  SpO2:  98% 98%   Weight:      Height:        Intake/Output Summary (Last 24 hours) at 06/09/17 1506 Last data filed at 06/09/17 1100  Gross per 24 hour  Intake              380 ml  Output              687 ml  Net             -307 ml   Filed Weights   06/08/17 1300 06/08/17 1711 06/09/17 0332  Weight: 58.3 kg (128 lb 8.5 oz) 57.5 kg (126 lb 12.2 oz) 57.1 kg (125 lb 14.1 oz)    Examination:  General exam: Appears calm and comfortable  HEENT: OP moist and clear Respiratory system: breath sounds decreased bilaterally no wheezing or rales Cardiovascular system: S1 & S2 heard, RRR. No JVD, murmurs, rubs or gallops Gastrointestinal system: Abdomen is nondistended, soft and nontender.  Positive hepatomegaly. Normal bowel sounds heard.  Central nervous system: Alert and oriented. No focal neurological deficits. 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 Mood & affect appropriate.   Data Reviewed: I have personally reviewed following labs and imaging studies  CBC:  Recent Labs Lab 05/30/2017 1014 06/27/2017 1047  WBC 13.9*  --   NEUTROABS 12.2*  --   HGB 15.4 18.4*  HCT 45.9  54.0*  MCV 106.7*  --   PLT 235  --    Basic Metabolic Panel:  Recent Labs Lab 06/18/2017 1014 06/01/2017 1047 06/08/17 1407 06/09/17 0430  NA 139 137 138 133*  K 4.6 4.5 4.7 4.6  CL 88* 91* 86* 92*  CO2 27  --  31 24  GLUCOSE 282* 281* 152* 63*  BUN 44* 51* 69* 38*  CREATININE 11.71* 11.50* 14.18* 8.49*  CALCIUM 9.8  --  9.1 8.3*  PHOS  --   --  8.0* 5.9*   GFR: Estimated Creatinine Clearance: 5.8 mL/min (A) (by C-G formula based on SCr of 8.49 mg/dL (H)). Liver Function Tests:  Recent Labs Lab 06/08/2017 1014 06/08/17 1407  AST 23  --   ALT 12*  --   ALKPHOS 56  --   BILITOT 0.9  --   PROT 9.3*  --   ALBUMIN 4.2 4.0    Recent Labs Lab 06/25/2017 1014  LIPASE 47   No results for input(s): AMMONIA in the last 168 hours. Coagulation Profile: No results for input(s): INR, PROTIME in the last 168 hours. Cardiac Enzymes:  Recent Labs Lab 06/01/2017 1846 06/23/2017 2136 06/08/17 0017  TROPONINI 0.09* 0.08* 0.08*   BNP (last 3 results) No results for input(s): PROBNP in the last 8760 hours. HbA1C: No results for input(s): HGBA1C in the last 72 hours. CBG:  Recent Labs Lab 06/08/17 1833 06/08/17 2019 06/09/17 0719 06/09/17 1121 06/09/17 1335  GLUCAP 162* 139* 85 130* 146*   Lipid Profile:  Recent Labs  06/09/17 0430  CHOL 171  HDL 72  LDLCALC 64  TRIG 174*  CHOLHDL 2.4   Thyroid Function Tests: No results for input(s): TSH, T4TOTAL, FREET4, T3FREE, THYROIDAB in the last 72 hours. Anemia Panel: No results for input(s): VITAMINB12, FOLATE, FERRITIN, TIBC, IRON, RETICCTPCT in the last 72 hours. Sepsis Labs: No results for input(s): PROCALCITON, LATICACIDVEN in the last 168 hours.  Recent Results (from the past 240 hour(s))  MRSA PCR Screening     Status: None   Collection Time: 05/31/2017  6:55 PM  Result Value Ref Range Status   MRSA by PCR NEGATIVE NEGATIVE Final    Comment:        The GeneXpert MRSA Assay (FDA approved for NASAL  specimens only), is one component of a comprehensive MRSA colonization surveillance program. It is not intended to diagnose MRSA infection nor to guide or monitor treatment for MRSA infections.      Radiology Studies: Dg Abd 1 View  Result Date: 06/09/2017 CLINICAL DATA:  Multi enters four-day loose, history of constipation EXAM: ABDOMEN - 1 VIEW COMPARISON:  None. FINDINGS: As a of the abdomen shows no obstruction. Gas is seen to the rectum. Only a mild to moderate amount of feces is noted throughout the colon. No opaque calculi noted. No acute bony abnormality is seen. IMPRESSION: No bowel obstruction. Only mild to moderate amount of feces is present throughout the colon. Electronically Signed   By: Eddie Dibbles  Alvester Chou M.D.   On: 06/09/2017 13:27    Scheduled Meds: . aspirin EC  81 mg Oral Daily  . buPROPion  150 mg Oral Daily  . calcitRIOL  0.75 mcg Oral Q M,W,F-HD  . calcium acetate  667-2,001 mg Oral TID WC  . carvedilol  6.25 mg Oral BID WC  . cinacalcet  30 mg Oral Q breakfast  . clopidogrel  75 mg Oral Daily  . docusate sodium  100 mg Oral BID  . doxazosin  8 mg Oral Daily  . feeding supplement (NEPRO CARB STEADY)  237 mL Oral BID BM  . fluorometholone  1 drop Both Eyes QHS  . gabapentin  100 mg Oral BID  . heparin  5,000 Units Subcutaneous Q8H  . hydrALAZINE  25 mg Oral BID  . insulin aspart  0-5 Units Subcutaneous QHS  . insulin aspart  0-9 Units Subcutaneous TID WC  . insulin detemir  10 Units Subcutaneous QHS  . isosorbide mononitrate  30 mg Oral Daily  . linaclotide  145 mcg Oral QAC breakfast  . multivitamin  1 tablet Oral QHS  . pantoprazole  40 mg Oral Daily  . polyvinyl alcohol  1 drop Both Eyes QHS  . QUEtiapine  200 mg Oral QHS  . sevelamer carbonate  800 mg Oral TID WC  . simvastatin  20 mg Oral QHS   Continuous Infusions: . [START ON 06/10/2017] ferric gluconate (FERRLECIT/NULECIT) IV       LOS: 2 days    Time spent: Total of 25 minutes spent with pt,  greater than 50% of which was spent in discussion of  treatment, counseling and coordination of care    Chipper Oman, MD Pager: Text Page via [email or web address]   If 7PM-7AM, please contact night-coverage [email or web address] 06/09/2017, 3:06 PM

## 2017-06-09 NOTE — Progress Notes (Signed)
Subjective:  Complains of abdomianl pain with constipation  No bm 2 days / tolerated HD yest  On schedule   Objective Vital signs in last 24 hours: Vitals:   06/09/17 0011 06/09/17 0332 06/09/17 0805 06/09/17 0813  BP: (!) 99/55 (!) 91/56 (!) 131/97   Pulse: 90 86 88   Resp: 15 15    Temp: 97.9 F (36.6 C) 98 F (36.7 C)  (!) 97.5 F (36.4 C)  TempSrc: Oral Oral  Axillary  SpO2: 99% 99%  94%  Weight:  57.1 kg (125 lb 14.1 oz)    Height:       Weight change: 1.056 kg (2 lb 5.3 oz)  Physical Exam: General: alert O X3  This am , thin chronically ill AAM, NAD but sl anxious co abd discomfort  Heart: RRR 1/6 sem lsb , no rub or gallop  Lungs: Bilat decreased BS  But otherwise CTA , no labored breathing Abdomen: BS pos . Soft tender epigastric area  Liver down 4 cm Extremities: R BKA no edema /healed stump/ no L pedal edema Retracted area on R stump  Dialysis Access: R IJ perm cath  NT /   Dialysis Orders: Center: [facility].  on  MonWedFri, 4 hrs 0 min, 180NRe BFR 400, DFR Manual 800 mL/min, EDW 61 (kg), Dialysate 2.0 K, 2.25 Ca,  Access:  R IJ  PermCatheter- ( REfusing any other access)_ Vitamin D (Calcitriol) Oral 0.75 mcg  Venofer 50mg  wkly hd   Problem/Plan  1. ESRD - HD  MWF /yest per bed wts below edw . Lower bp meds 2. Chest pain - resolved  card wu / Med tx for now 3. Abd  Pain / constipation / sorbital  Prn/ colace bid   4. Hypertension/volume  -below edw with hd yest / am bps labile  Stop clonidine   (probably  not taking at Home)  Below edw and not eating much with gi pain=  tomor  hd  No uf  5. Anemia  - HGB 18 <15  no esa needs/ fe weekly wed hd 6. Metabolic bone disease -  On po Vit d on HD / binder= Renvela , sensipar  7. Nutrition - Rena/carb diet / renal vit  Alb 4.0 !!  8. GERD- Per Admit  9. PVD- R BKA 10. Copd /Tobacco Abuse -"1 PPD ' discussed need to stop  11. HLD on zocorGeneral:       Ernest Haber, PA-C [facility]  [phone] 06/09/2017,8:28 AM  LOS: 2 days  I have seen and examined this patient and agree with the plan of care seen , eval , examined, counseled, dicussed with primary, PA, pharmacy, . Counseled on smoking,  .  Dacy Enrico L 06/09/2017, 12:59 PM   Labs: Basic Metabolic Panel:  Recent Labs Lab 06/08/2017 1014 06/10/2017 1047 06/08/17 1407 06/09/17 0430  NA 139 137 138 133*  K 4.6 4.5 4.7 4.6  CL 88* 91* 86* 92*  CO2 27  --  31 24  GLUCOSE 282* 281* 152* 63*  BUN 44* 51* 69* 38*  CREATININE 11.71* 11.50* 14.18* 8.49*  CALCIUM 9.8  --  9.1 8.3*  PHOS  --   --  8.0* 5.9*   Liver Function Tests:  Recent Labs Lab 06/03/2017 1014 06/08/17 1407  AST 23  --   ALT 12*  --   ALKPHOS 56  --   BILITOT 0.9  --   PROT 9.3*  --   ALBUMIN 4.2 4.0  Recent Labs Lab 05/31/2017 1014  LIPASE 47   No results for input(s): AMMONIA in the last 168 hours. CBC:  Recent Labs Lab 06/18/2017 1014 06/22/2017 1047  WBC 13.9*  --   NEUTROABS 12.2*  --   HGB 15.4 18.4*  HCT 45.9 54.0*  MCV 106.7*  --   PLT 235  --    Cardiac Enzymes:  Recent Labs Lab 06/28/2017 1846 06/09/2017 2136 06/08/17 0017  TROPONINI 0.09* 0.08* 0.08*   CBG:  Recent Labs Lab 06/08/17 0804 06/08/17 1130 06/08/17 1833 06/08/17 2019 06/09/17 0719  GLUCAP 118* 190* 162* 139* 85    Studies/Results: Dg Chest 2 View  Result Date: 06/03/2017 CLINICAL DATA:  Chest pain, nausea and vomiting. Coffee ground emesis per family. On dialysis. EXAM: CHEST  2 VIEW COMPARISON:  Chest x-ray dated 01/23/2017. FINDINGS: Cardiomediastinal silhouette is within normal limits. Atherosclerotic changes noted at the aortic arch. Lungs are clear. No pleural effusion or pneumothorax seen. Right-sided dialysis catheter appears appropriately position. No acute or suspicious osseous finding. IMPRESSION: No active cardiopulmonary disease. No evidence of pneumonia or pulmonary edema. Aortic atherosclerosis. Electronically Signed   By: Franki Cabot M.D.   On: 06/12/2017 10:32   Medications: . [START ON 06/10/2017] ferric gluconate (FERRLECIT/NULECIT) IV     . aspirin EC  81 mg Oral Daily  . buPROPion  150 mg Oral Daily  . calcitRIOL  0.75 mcg Oral Q M,W,F-HD  . calcium acetate  667-2,001 mg Oral TID WC  . carvedilol  6.25 mg Oral BID WC  . cinacalcet  30 mg Oral Q breakfast  . cloNIDine  0.2 mg Oral BID  . clopidogrel  75 mg Oral Daily  . doxazosin  8 mg Oral Daily  . feeding supplement (NEPRO CARB STEADY)  237 mL Oral BID BM  . fluorometholone  1 drop Both Eyes QHS  . gabapentin  100 mg Oral BID  . heparin  5,000 Units Subcutaneous Q8H  . hydrALAZINE  50 mg Oral BID  . insulin aspart  0-5 Units Subcutaneous QHS  . insulin aspart  0-9 Units Subcutaneous TID WC  . insulin detemir  10 Units Subcutaneous QHS  . isosorbide mononitrate  30 mg Oral Daily  . linaclotide  145 mcg Oral QAC breakfast  . multivitamin  1 tablet Oral QHS  . pantoprazole  40 mg Oral Daily  . polyvinyl alcohol  1 drop Both Eyes QHS  . QUEtiapine  200 mg Oral QHS  . sevelamer carbonate  800 mg Oral TID WC  . simvastatin  20 mg Oral QHS

## 2017-06-10 ENCOUNTER — Other Ambulatory Visit: Payer: Self-pay | Admitting: Cardiology

## 2017-06-10 DIAGNOSIS — D72829 Elevated white blood cell count, unspecified: Secondary | ICD-10-CM

## 2017-06-10 DIAGNOSIS — N186 End stage renal disease: Secondary | ICD-10-CM

## 2017-06-10 DIAGNOSIS — R1011 Right upper quadrant pain: Secondary | ICD-10-CM

## 2017-06-10 DIAGNOSIS — E1122 Type 2 diabetes mellitus with diabetic chronic kidney disease: Secondary | ICD-10-CM

## 2017-06-10 DIAGNOSIS — Z992 Dependence on renal dialysis: Secondary | ICD-10-CM

## 2017-06-10 DIAGNOSIS — I10 Essential (primary) hypertension: Secondary | ICD-10-CM

## 2017-06-10 DIAGNOSIS — Z720 Tobacco use: Secondary | ICD-10-CM

## 2017-06-10 DIAGNOSIS — R079 Chest pain, unspecified: Secondary | ICD-10-CM

## 2017-06-10 DIAGNOSIS — I200 Unstable angina: Secondary | ICD-10-CM

## 2017-06-10 DIAGNOSIS — I5189 Other ill-defined heart diseases: Secondary | ICD-10-CM

## 2017-06-10 DIAGNOSIS — Z89511 Acquired absence of right leg below knee: Secondary | ICD-10-CM

## 2017-06-10 DIAGNOSIS — I519 Heart disease, unspecified: Secondary | ICD-10-CM

## 2017-06-10 LAB — CBC WITH DIFFERENTIAL/PLATELET
BASOS ABS: 0 10*3/uL (ref 0.0–0.1)
BASOS PCT: 0 %
EOS PCT: 0 %
Eosinophils Absolute: 0 10*3/uL (ref 0.0–0.7)
HCT: 46.9 % (ref 39.0–52.0)
Hemoglobin: 15.9 g/dL (ref 13.0–17.0)
Lymphocytes Relative: 7 %
Lymphs Abs: 0.9 10*3/uL (ref 0.7–4.0)
MCH: 35.3 pg — ABNORMAL HIGH (ref 26.0–34.0)
MCHC: 33.9 g/dL (ref 30.0–36.0)
MCV: 104.2 fL — ABNORMAL HIGH (ref 78.0–100.0)
MONO ABS: 1.7 10*3/uL — AB (ref 0.1–1.0)
MONOS PCT: 13 %
Neutro Abs: 10.6 10*3/uL — ABNORMAL HIGH (ref 1.7–7.7)
Neutrophils Relative %: 80 %
PLATELETS: 202 10*3/uL (ref 150–400)
RBC: 4.5 MIL/uL (ref 4.22–5.81)
RDW: 17.1 % — AB (ref 11.5–15.5)
WBC: 13.2 10*3/uL — ABNORMAL HIGH (ref 4.0–10.5)

## 2017-06-10 LAB — RENAL FUNCTION PANEL
ALBUMIN: 3.9 g/dL (ref 3.5–5.0)
Anion gap: 21 — ABNORMAL HIGH (ref 5–15)
BUN: 65 mg/dL — AB (ref 6–20)
CHLORIDE: 90 mmol/L — AB (ref 101–111)
CO2: 19 mmol/L — ABNORMAL LOW (ref 22–32)
CREATININE: 11.03 mg/dL — AB (ref 0.61–1.24)
Calcium: 8.4 mg/dL — ABNORMAL LOW (ref 8.9–10.3)
GFR calc Af Amer: 4 mL/min — ABNORMAL LOW (ref >60)
GFR, EST NON AFRICAN AMERICAN: 4 mL/min — AB (ref >60)
GLUCOSE: 110 mg/dL — AB (ref 65–99)
PHOSPHORUS: 8.8 mg/dL — AB (ref 2.5–4.6)
Potassium: 5.7 mmol/L — ABNORMAL HIGH (ref 3.5–5.1)
Sodium: 130 mmol/L — ABNORMAL LOW (ref 135–145)

## 2017-06-10 LAB — GLUCOSE, CAPILLARY
GLUCOSE-CAPILLARY: 196 mg/dL — AB (ref 65–99)
Glucose-Capillary: 111 mg/dL — ABNORMAL HIGH (ref 65–99)
Glucose-Capillary: 240 mg/dL — ABNORMAL HIGH (ref 65–99)

## 2017-06-10 MED ORDER — ISOSORBIDE MONONITRATE ER 30 MG PO TB24: 30.0000 mg | ORAL_TABLET | Freq: Every day | ORAL | 0 refills | Status: AC

## 2017-06-10 MED ORDER — ASPIRIN 81 MG PO TBEC
81.0000 mg | DELAYED_RELEASE_TABLET | Freq: Every day | ORAL | 0 refills | Status: AC
Start: 2017-06-10 — End: 2017-07-10

## 2017-06-10 MED ORDER — HYDRALAZINE HCL 25 MG PO TABS: 25.0000 mg | ORAL_TABLET | Freq: Two times a day (BID) | ORAL | 0 refills | Status: AC

## 2017-06-10 MED ORDER — CALCITRIOL 0.25 MCG PO CAPS: 0.7500 ug | ORAL_CAPSULE | ORAL | 0 refills | Status: AC

## 2017-06-10 MED ORDER — ALTEPLASE 2 MG IJ SOLR
INTRAMUSCULAR | Status: AC
  Administered 2017-06-10: 4 mg via ARTERIOVENOUS_FISTULA
  Filled 2017-06-10: qty 4

## 2017-06-10 MED ORDER — CARVEDILOL 6.25 MG PO TABS: 6.2500 mg | ORAL_TABLET | Freq: Two times a day (BID) | ORAL | 0 refills | Status: AC

## 2017-06-10 NOTE — Consult Note (Signed)
Physical Medicine and Rehabilitation Consult Reason for Consult: Decreased functional mobility with a history of right BKA Referring Physician: [facility]   HPI: Gerald Davenport is a 76 y.o. right handed male with history of end-stage renal disease with hemodialysis, right BKA 2004, hypertension. History taken from chat review and patient. Patient lives with spouse. One level home with a ramped entrance. Use a prosthesis for ambulation as well as a walker prior to admission. Presented 06/26/2017 with nonspecific chest pain, mild altered mental status changes as well as one episode of vomiting. He was given 3 nitroglycerin. EKG showed T-wave inversions. Blood pressure 216/89. He was started on nitro drip. Troponin 0.9. Echocardiogram with ejection fraction of 84% grade 1 diastolic dysfunction. Cardiology follow-up present recommendations are for medical management. Plan set up outpatient nuclear stress test. Hemodialysis ongoing as per renal services. Subcutaneous heparin for DVT prophylaxis. Physical therapy evaluation noting decreased functional mobility with recommendations of physical medicine rehabilitation consult. Family at bedside, who agree to outpatient rehab.   Review of Systems  Constitutional: Negative for chills and fever.  HENT: Negative for hearing loss.   Eyes: Negative for blurred vision and double vision.  Respiratory: Positive for shortness of breath.   Gastrointestinal: Positive for constipation and nausea.  Skin: Negative for rash.  Neurological: Positive for weakness.  Psychiatric/Behavioral:       Anxiety  All other systems reviewed and are negative.  Past Medical History:  Diagnosis Date  . Anemia   . Anxiety   . Cancer [facility])    Prostate cancer treated with radiation therapy  . Chronic kidney disease    dialysis Mon, Wednes, Fri  . Clot 05/2006   Arterial clot right leg   . Constipation   . Diabetes mellitus    Type II  . Diverticulosis   . GERD  (gastroesophageal reflux disease)   . Hemodialysis patient ([place]) 6/13  . Hyperlipidemia   . Hypertension   . Infection    drainage from distal incision of left upper arm arteriovenous graft  . Positive PPD   . Shortness of breath dyspnea    with exertion, when he has too much fluid  . Steal syndrome dialysis vascular access ([place])   . Thyroid disease    hyperparathyroidism  . Wears glasses    Past Surgical History:  Procedure Laterality Date  . ABDOMINAL AORTOGRAM W/LOWER EXTREMITY N/A 02/12/2017   Procedure: Abdominal Aortogram w/Lower Extremity;  Surgeon: Conrad Snyderville, MD;  Location: [place] CV LAB;  Service: Cardiovascular;  Laterality: N/A;  . AV FISTULA PLACEMENT  11/04/2010   left and left  . AV FISTULA PLACEMENT  11/20/2011   Procedure: INSERTION OF ARTERIOVENOUS (AV) GORE-TEX GRAFT ARM;  Surgeon: Angelia Mould, MD;  Location: [place];  Service: Vascular;  Laterality: Right;  . AV FISTULA PLACEMENT Right 04/19/2015   Procedure: Excision of infected RUA AVG;  Surgeon: Conrad El Rancho, MD;  Location: [place];  Service: Vascular;  Laterality: Right;  . AV FISTULA PLACEMENT Left 10/04/2015   Procedure: INSERTION OF ARTERIOVENOUS (AV) GORE-TEX GRAFT ARM;  Surgeon: Conrad Butte, MD;  Location: [place];  Service: Vascular;  Laterality: Left;  . [place] REMOVAL Left 10/13/2015   Procedure: REMOVAL OF INFECTED ARTERIOVENOUS GORETEX LEFT GRAFT ;  Surgeon: Conrad Rickardsville, MD;  Location: [place];  Service: Vascular;  Laterality: Left;  . BASCILIC VEIN TRANSPOSITION Left 06/19/2015   Procedure: FIRST STAGE BRACHIAL VEIN TRANSPOSITION;  Surgeon: Conrad Peoria,  MD;  Location: [place];  Service: Vascular;  Laterality: Left;  . 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 COLONOSCOPY N/A 11/10/2012   DUK:[id number] proctitis/Pancolonic diverticulosis/(TUBULAR ADENOMA)5 mm polyp in the transverse segment/4 mm polyps in the base of the cecum and diminutive polyp in the descending segment. INADEQUATE BOWEL  PREP  . COLONOSCOPY N/A 11/28/2013   RMR: Radiation proctitis. Colonic diverticulosis. Colonic polyps-removed as described above.   . INSERTION OF DIALYSIS CATHETER Right 01/19/2016   Procedure: INSERTION OF Right internal jugular  DIALYSIS CATHETER.;  Surgeon: Elam Dutch, MD;  Location: [place];  Service: Vascular;  Laterality: Right;  . PERIPHERAL VASCULAR BALLOON ANGIOPLASTY Left 02/12/2017   Procedure: Peripheral Vascular Balloon Angioplasty;  Surgeon: Conrad Century, MD;  Location: [place] [facility];  Service: Cardiovascular;  Laterality: Left;  SFA  . PERIPHERAL VASCULAR CATHETERIZATION Bilateral 12/06/2015   Procedure: Upper Extremity Venography/ central venogram;  Surgeon: Conrad Trinity, MD;  Location: [place] [facility];  Service: Cardiovascular;  Laterality: Bilateral;  . PERIPHERAL VASCULAR CATHETERIZATION N/A 05/29/2016   Procedure: Upper Extremity Angiography;  Surgeon: Conrad Red Creek, MD;  Location: [place] [facility];  Service: Cardiovascular;  Laterality: N/A;  . PERIPHERAL VASCULAR CATHETERIZATION N/A 05/29/2016   Procedure: Lower Extremity Angiography;  Surgeon: Conrad [place] Center, MD;  Location: [place] CV LAB;  Service: Cardiovascular;  Laterality: N/A;  . PERIPHERAL VASCULAR CATHETERIZATION  05/29/2016   Procedure: Peripheral Vascular Intervention;  Surgeon: Conrad Nichols, MD;  Location: [place] [facility] LAB;  Service: Cardiovascular;;  . REMOVAL OF A HERO DEVICE Right 01/19/2016   Procedure: REMOVAL OF A HERO DEVICE;  Surgeon: Elam Dutch, MD;  Location: [place];  Service: Vascular;  Laterality: Right;  . SHUNTOGRAM N/A 10/08/2012   Procedure: [name];  Surgeon: Elam Dutch, MD;  Location: [facility] CATH LAB;  Service: Cardiovascular;  Laterality: N/A;  . VASCULAR ACCESS DEVICE INSERTION Right 01/01/2016   Procedure: INSERTION OF HERO VASCULAR ACCESS DEVICE - RIGHT;  Surgeon: Conrad Woodside, MD;  Location: [place];  Service: Vascular;  Laterality: Right;   Family History  Problem Relation  Age of Onset  . Heart disease Mother   . Diabetes Mother   . Hypertension Mother   . Stroke Mother   . Heart disease Father   . Diabetes Father   . Heart disease Sister   . Diabetes Brother   . Colon cancer Neg Hx    Social History:  reports that he has been smoking Cigarettes.  He has a 12.50 pack-year smoking history. He has never used smokeless tobacco. He reports that he does not drink alcohol or use drugs. Allergies: No Known Allergies Facility-Administered Medications Prior to Admission  Medication Dose Route Frequency Provider Last Rate Last Dose  . labetalol (NORMODYNE,TRANDATE) injection 10 mg  10 mg Intravenous Q2H PRN Conrad Yankton, MD       Medications Prior to Admission  Medication Sig Dispense Refill  . buPROPion (WELLBUTRIN XL) 150 MG 24 hr tablet Take 150 mg by mouth daily.    . calcium acetate (PHOSLO) 667 MG capsule Take 667-2,001 mg by mouth See admin instructions. Take 3 capsules (2001 mg) by mouth three times daily with meals and 1 capsule (667 mg) with snacks    . carboxymethylcellulose (REFRESH PLUS) 0.5 % SOLN Place 1 drop into both eyes 3 (three) times daily as needed (dry eyes/ itching).     . cinacalcet (SENSIPAR) 30 MG tablet  Take 30 mg by mouth daily with breakfast.     . cloNIDine (CATAPRES) 0.2 MG tablet Take 1 tablet (0.2 mg total) by mouth 3 (three) times daily. 42 tablet 0  . clopidogrel (PLAVIX) 75 MG tablet Take 1 tablet (75 mg total) by mouth daily. 30 tablet 11  . doxazosin (CARDURA) 8 MG tablet Take 8 mg by mouth daily.     . fluorometholone (FML) 0.1 % ophthalmic suspension Place 1 drop into both eyes at bedtime.    . furosemide (LASIX) 40 MG tablet Take 40 mg by mouth 2 (two) times daily.     Marland Kitchen gabapentin (NEURONTIN) 100 MG capsule Take 100 mg by mouth 2 (two) times daily.    . Insulin Detemir (LEVEMIR FLEXTOUCH) 100 UNIT/ML Pen Inject 10 Units into the skin daily before breakfast.     . Linaclotide (LINZESS) 145 MCG CAPS capsule Take 1 capsule  (145 mcg total) by mouth daily. Take 30 minutes before breakfast (Patient taking differently: Take 145 mcg by mouth daily before breakfast. Take 30 minutes before breakfast) 30 capsule 11  . omeprazole (PRILOSEC) 20 MG capsule Take 20 mg by mouth daily.     . Psyllium (METAMUCIL PO) Take by mouth See admin instructions. Mix 1 capful in 8 oz liquid and drink daily as needed for constipation    . QUEtiapine (SEROQUEL) 200 MG tablet Take 200 mg by mouth at bedtime.  3  . saxagliptin HCl (ONGLYZA) 5 MG TABS tablet Take 5 mg by mouth daily.     . sevelamer carbonate (RENVELA) 800 MG tablet Take 1,600 mg by mouth 3 (three) times daily with meals.     . simvastatin (ZOCOR) 20 MG tablet Take 20 mg by mouth at bedtime.     . Nutritional Supplements (FEEDING SUPPLEMENT, NEPRO CARB STEADY,) LIQD Take 237 mLs by mouth 2 (two) times daily between meals. (Patient not taking: Reported on 06/08/2017) 2 Can 0    Home: [place] expects to be discharged to:: Private residence Living Arrangements: Spouse/significant other Available Help at Discharge: Family, Available 24 hours/day Type of Home: House Home Access: Ramped entrance Home Layout: One level Bathroom Shower/Tub: Multimedia programmer: Weaver: Environmental consultant - 2 wheels, [employer] - single point, Careers adviser History: Prior Function Level of Independence: Needs assistance Gait / Transfers Assistance Needed: Pt uses prosthesis for ambulation, has a walker and cane but rarely uses Comments: Per chart review, as of December of last year, Mr. Deshotel was driving, unlimited community ambulator Functional Status:  Mobility: Bed Mobility Overal bed mobility: Needs Assistance Bed Mobility: Rolling, Sidelying to Sit, Supine to Sit, Sit to Supine Rolling: Supervision Sidelying to sit: Min assist Supine to sit: Min assist Sit to supine: Min assist General bed mobility comments: Initially got to EOB straight from  supine to sit, min handheld assist to pull to sit; better with rolling technqiue; Required min assist to help his legs back into bed Transfers Overall transfer level: Needs assistance Equipment used: Rolling walker (2 wheeled) Transfers: Sit to/from Stand Sit to Stand: Mod assist General transfer comment: Heavy mod assist to power up to stand; cues for hand placement and use of RW; very fatigued in standing, head and shoulders forward; unable to get a BP reading Ambulation/Gait General Gait Details: Too fatigued today    ADL:    Cognition: Cognition Overall Cognitive Status: No family/caregiver present to determine baseline cognitive functioning Orientation Level: Oriented to person, Oriented to place, Disoriented to  time, Disoriented to situation Cognition Arousal/Alertness: Awake/alert (Sleepy) Behavior During Therapy: WFL for tasks assessed/performed Overall Cognitive Status: No family/caregiver present to determine baseline cognitive functioning General Comments: Difficult to understand  Blood pressure 125/80, pulse 99, temperature 98.2 F (36.8 C), temperature source Oral, resp. rate (!) 21, height 5\' 8"  (1.727 m), weight 58 kg (127 lb 13.9 oz), SpO2 98 %. Physical Exam  Vitals reviewed. Constitutional: He appears well-developed.  Frail  HENT:  Head: Normocephalic and atraumatic.  Eyes: EOM are normal. Right eye exhibits no discharge. Left eye exhibits no discharge.  Neck: Normal range of motion. Neck supple. No thyromegaly present.  Cardiovascular: Normal rate, regular rhythm and normal heart sounds.   Respiratory: Effort normal and breath sounds normal. No respiratory distress.  GI: Soft. Bowel sounds are normal. He exhibits no distension.  Musculoskeletal: He exhibits no edema or tenderness.  Neurological: He is alert.  Patient is alert.  He does follow simple commands.  Provides his name and date of birth.  Motor: 4/5 grossly throughout  Skin: Skin is warm and dry.   Right BKA site well-healed.  Psychiatric: He has a normal mood and affect. His behavior is normal.    Results for orders placed or performed during the hospital encounter of 06/12/2017 (from the past 24 hour(s))  Glucose, capillary     Status: Abnormal   Collection Time: 06/09/17  1:35 PM  Result Value Ref Range   Glucose-Capillary 146 (H) 65 - 99 mg/dL  Comprehensive metabolic panel     Status: Abnormal   Collection Time: 06/09/17  2:42 PM  Result Value Ref Range   Sodium 132 (L) 135 - 145 mmol/L   Potassium 5.3 (H) 3.5 - 5.1 mmol/L   Chloride 91 (L) 101 - 111 mmol/L   CO2 20 (L) 22 - 32 mmol/L   Glucose, Bld 140 (H) 65 - 99 mg/dL   BUN 50 (H) 6 - 20 mg/dL   Creatinine, Ser 9.97 (H) 0.61 - 1.24 mg/dL   Calcium 8.3 (L) 8.9 - 10.3 mg/dL   Total Protein 8.8 (H) 6.5 - 8.1 g/dL   Albumin 4.0 3.5 - 5.0 g/dL   AST 25 15 - 41 U/L   ALT 13 (L) 17 - 63 U/L   Alkaline Phosphatase 56 38 - 126 U/L   Total Bilirubin QUANTITY NOT SUFFICIENT, UNABLE TO PERFORM TEST 0.3 - 1.2 mg/dL   GFR calc non Af Amer 4 (L) >60 mL/min   GFR calc Af Amer 5 (L) >60 mL/min   Anion gap 21 (H) 5 - 15  CBC with Differential/Platelet     Status: Abnormal   Collection Time: 06/09/17  2:42 PM  Result Value Ref Range   WBC 13.6 (H) 4.0 - 10.5 K/uL   RBC 4.14 (L) 4.22 - 5.81 MIL/uL   Hemoglobin 15.0 13.0 - 17.0 g/dL   HCT 44.1 39.0 - 52.0 %   MCV 106.5 (H) 78.0 - 100.0 fL   MCH 36.2 (H) 26.0 - 34.0 pg   MCHC 34.0 30.0 - 36.0 g/dL   RDW 17.4 (H) 11.5 - 15.5 %   Platelets 197 150 - 400 K/uL   Neutrophils Relative % 82 %   Neutro Abs 11.1 (H) 1.7 - 7.7 K/uL   Lymphocytes Relative 7 %   Lymphs Abs 1.0 0.7 - 4.0 K/uL   Monocytes Relative 11 %   Monocytes Absolute 1.5 (H) 0.1 - 1.0 K/uL   Eosinophils Relative 0 %   Eosinophils Absolute 0.0 0.0 -  0.7 K/uL   Basophils Relative 0 %   Basophils Absolute 0.0 0.0 - 0.1 K/uL  Culture, blood (Routine X 2) w Reflex to ID Panel     Status: None (Preliminary result)    Collection Time: 06/09/17  2:42 PM  Result Value Ref Range   Specimen Description BLOOD LEFT ARM    Special Requests IN PEDIATRIC BOTTLE Blood Culture adequate volume    Culture NO GROWTH < 24 HOURS    Report Status PENDING   Culture, blood (Routine X 2) w Reflex to ID Panel     Status: None (Preliminary result)   Collection Time: 06/09/17  2:42 PM  Result Value Ref Range   Specimen Description BLOOD RIGHT ARM    Special Requests IN PEDIATRIC BOTTLE Blood Culture adequate volume    Culture NO GROWTH < 24 HOURS    Report Status PENDING   Glucose, capillary     Status: Abnormal   Collection Time: 06/09/17  4:10 PM  Result Value Ref Range   Glucose-Capillary 170 (H) 65 - 99 mg/dL  Glucose, capillary     Status: Abnormal   Collection Time: 06/09/17  8:43 PM  Result Value Ref Range   Glucose-Capillary 133 (H) 65 - 99 mg/dL  CBC with Differential/Platelet     Status: Abnormal   Collection Time: 06/10/17  3:42 AM  Result Value Ref Range   WBC 13.2 (H) 4.0 - 10.5 K/uL   RBC 4.50 4.22 - 5.81 MIL/uL   Hemoglobin 15.9 13.0 - 17.0 g/dL   HCT 46.9 39.0 - 52.0 %   MCV 104.2 (H) 78.0 - 100.0 fL   MCH 35.3 (H) 26.0 - 34.0 pg   MCHC 33.9 30.0 - 36.0 g/dL   RDW 17.1 (H) 11.5 - 15.5 %   Platelets 202 150 - 400 K/uL   Neutrophils Relative % 80 %   Neutro Abs 10.6 (H) 1.7 - 7.7 K/uL   Lymphocytes Relative 7 %   Lymphs Abs 0.9 0.7 - 4.0 K/uL   Monocytes Relative 13 %   Monocytes Absolute 1.7 (H) 0.1 - 1.0 K/uL   Eosinophils Relative 0 %   Eosinophils Absolute 0.0 0.0 - 0.7 K/uL   Basophils Relative 0 %   Basophils Absolute 0.0 0.0 - 0.1 K/uL  Renal function panel     Status: Abnormal   Collection Time: 06/10/17  3:42 AM  Result Value Ref Range   Sodium 130 (L) 135 - 145 mmol/L   Potassium 5.7 (H) 3.5 - 5.1 mmol/L   Chloride 90 (L) 101 - 111 mmol/L   CO2 19 (L) 22 - 32 mmol/L   Glucose, Bld 110 (H) 65 - 99 mg/dL   BUN 65 (H) 6 - 20 mg/dL   Creatinine, Ser 11.03 (H) 0.61 - 1.24 mg/dL    Calcium 8.4 (L) 8.9 - 10.3 mg/dL   Phosphorus 8.8 (H) 2.5 - 4.6 mg/dL   Albumin 3.9 3.5 - 5.0 g/dL   GFR calc non Af Amer 4 (L) >60 mL/min   GFR calc Af Amer 4 (L) >60 mL/min   Anion gap 21 (H) 5 - 15  Glucose, capillary     Status: Abnormal   Collection Time: 06/10/17  9:52 AM  Result Value Ref Range   Glucose-Capillary 111 (H) 65 - 99 mg/dL  Glucose, capillary     Status: Abnormal   Collection Time: 06/10/17 11:38 AM  Result Value Ref Range   Glucose-Capillary 240 (H) 65 - 99 mg/dL   Dg  Abd 1 View  Result Date: 06/09/2017 CLINICAL DATA:  Multi enters four-day loose, history of constipation EXAM: ABDOMEN - 1 VIEW COMPARISON:  None. FINDINGS: As a of the abdomen shows no obstruction. Gas is seen to the rectum. Only a mild to moderate amount of feces is noted throughout the colon. No opaque calculi noted. No acute bony abnormality is seen. IMPRESSION: No bowel obstruction. Only mild to moderate amount of feces is present throughout the colon. Electronically Signed   By: Ivar Drape M.D.   On: 06/09/2017 13:27    Assessment/Plan: Diagnosis: Debility Labs and images independently reviewed.  Records reviewed and summated above.  1. Does the need for close, 24 hr/day medical supervision in concert with the patient's rehab needs make it unreasonable for this patient to be served in a less intensive setting? Yes  2. Co-Morbidities requiring supervision/potential complications: end-stage renal disease with hemodialysis (recs per Nephro), right BKA 2004 (cont prosthesis), HTN (monitor and provide prns in accordance with increased physical exertion and pain), diastolic dysfunction (monitor for signs and symptoms of fluid overload), Tobacco abuse (counsel), leukocytosis (cont to monitor for signs and symptoms of infection, further workup if indicated) 3. Due to safety, disease management and patient education, does the patient require 24 hr/day rehab nursing? Potentially 4. Does the patient require  coordinated care of a physician, rehab nurse, PT (1-2 hrs/day, 5 days/week) and OT (1-2 hrs/day, 5 days/week) to address physical and functional deficits in the context of the above medical diagnosis(es)? Yes Addressing deficits in the following areas: balance, endurance, locomotion, strength, transferring, bathing, dressing, toileting and psychosocial support 5. Can the patient actively participate in an intensive therapy program of at least 3 hrs of therapy per day at least 5 days per week? Yes 6. The potential for patient to make measurable gains while on inpatient rehab is excellent 7. Anticipated functional outcomes upon discharge from inpatient rehab are supervision  with PT, supervision with OT, n/a with SLP. 8. Estimated rehab length of stay to reach the above functional goals is: 13-16 days. 9. Anticipated D/C setting: Home 10. Anticipated post D/C treatments: HH therapy and Home excercise program 11. Overall Rehab/Functional Prognosis: good  RECOMMENDATIONS: This patient's condition is appropriate for continued rehabilitative care in the following setting: Recommend CIR, however, pt refusing stating he is going home.  WIll consider CIR if pt ammenable. Patient has agreed to participate in recommended program. Potentially Note that insurance prior authorization may be required for reimbursement for recommended care.  Comment: Rehab Admissions Coordinator to follow up.  Delice Lesch, MD, Mellody Drown Cathlyn Parsons., PA-C 06/10/2017

## 2017-06-10 NOTE — Progress Notes (Signed)
Subjective: Interval History: has no complaint .  Objective: Vital signs in last 24 hours: Temp:  [96.5 F (35.8 C)-97.7 F (36.5 C)] 97.7 F (36.5 C) (09/12 0658) Pulse Rate:  [83-92] 88 (09/12 0830) Resp:  [13-26] 18 (09/12 0830) BP: (92-159)/(44-83) 154/44 (09/12 0830) SpO2:  [98 %-100 %] 100 % (09/12 0658) Weight:  [57.8 kg (127 lb 6.8 oz)-58 kg (127 lb 13.9 oz)] 58 kg (127 lb 13.9 oz) (09/12 0658) Weight change: -0.5 kg (-1 lb 1.6 oz)  Intake/Output from previous day: 09/11 0701 - 09/12 0700 In: 500 [P.O.:500] Out: 0  Intake/Output this shift: No intake/output data recorded.  General appearance: cooperative and no distress Resp: diminished breath sounds bilaterally and wheezes bibasilar Chest wall: RIJ cath Cardio: S1, S2 normal and systolic murmur: systolic ejection 2/6, crescendo and decrescendo at 2nd left intercostal space GI: soft, non-tender; bowel sounds normal; no masses,  no organomegaly ExtremitieR BKAR BKA  Lab Results:  Recent Labs  06/09/17 1442 06/10/17 0342  WBC 13.6* 13.2*  HGB 15.0 15.9  HCT 44.1 46.9  PLT 197 202   BMET:  Recent Labs  06/09/17 1442 06/10/17 0342  NA 132* 130*  K 5.3* 5.7*  CL 91* 90*  CO2 20* 19*  GLUCOSE 140* 110*  BUN 50* 65*  CREATININE 9.97* 11.03*  CALCIUM 8.3* 8.4*   No results for input(s): PTH in the last 72 hours. Iron Studies: No results for input(s): IRON, TIBC, TRANSFERRIN, FERRITIN in the last 72 hours.  Studies/Results: Dg Abd 1 View  Result Date: 06/09/2017 CLINICAL DATA:  Multi enters four-day loose, history of constipation EXAM: ABDOMEN - 1 VIEW COMPARISON:  None. FINDINGS: As a of the abdomen shows no obstruction. Gas is seen to the rectum. Only a mild to moderate amount of feces is noted throughout the colon. No opaque calculi noted. No acute bony abnormality is seen. IMPRESSION: No bowel obstruction. Only mild to moderate amount of feces is present throughout the colon. Electronically Signed   By:  Ivar Drape M.D.   On: 06/09/2017 13:27    I have reviewed the patient's current medications.  Assessment/Plan: 1 ESRD for HD 2 HTN lower vol cont meds 3 DM controlled 4 PVD  5 CAD 6 Anemia stable 7 HPTH vit D 8 CP resolved P HD, bp meds,     LOS: 3 days   Gerald Davenport L 06/10/2017,8:54 AM

## 2017-06-10 NOTE — Plan of Care (Signed)
Problem: Nutrition: Goal: Adequate nutrition will be maintained Outcome: Progressing Patient endorses poor appetite. Consumes less than 25% of meals. Educated on the importance of maintaining adequate intake for strength and healing

## 2017-06-10 NOTE — Progress Notes (Signed)
tx stopped sytem clotted d/t frequent stops; HD cath not working good; cath not aspirating good from both ports.

## 2017-06-10 NOTE — Progress Notes (Addendum)
Progress Note  Patient Name: Gerald Davenport Date of Encounter: 06/10/2017  Primary Cardiologist: Dr. Radford Pax  Subjective   Denies any chest pain or pressure.  No SOB.  Appears less confused today.  Currently in HD  Inpatient Medications    Scheduled Meds: . aspirin EC  81 mg Oral Daily  . buPROPion  150 mg Oral Daily  . calcitRIOL  0.75 mcg Oral Q M,W,F-HD  . calcium acetate  667-2,001 mg Oral TID WC  . carvedilol  6.25 mg Oral BID WC  . cinacalcet  30 mg Oral Q breakfast  . clopidogrel  75 mg Oral Daily  . docusate sodium  100 mg Oral BID  . doxazosin  8 mg Oral Daily  . feeding supplement (NEPRO CARB STEADY)  237 mL Oral BID BM  . fluorometholone  1 drop Both Eyes QHS  . gabapentin  100 mg Oral BID  . heparin  5,000 Units Subcutaneous Q8H  . hydrALAZINE  25 mg Oral BID  . insulin aspart  0-5 Units Subcutaneous QHS  . insulin aspart  0-9 Units Subcutaneous TID WC  . insulin detemir  10 Units Subcutaneous QHS  . isosorbide mononitrate  30 mg Oral Daily  . linaclotide  145 mcg Oral QAC breakfast  . multivitamin  1 tablet Oral QHS  . pantoprazole  40 mg Oral Daily  . polyvinyl alcohol  1 drop Both Eyes QHS  . QUEtiapine  200 mg Oral QHS  . sevelamer carbonate  800 mg Oral TID WC  . simvastatin  20 mg Oral QHS   Continuous Infusions: . ferric gluconate (FERRLECIT/NULECIT) IV     PRN Meds: acetaminophen, gi cocktail, ondansetron (ZOFRAN) IV, oxyCODONE, psyllium, sorbitol   Vital Signs    Vitals:   06/10/17 0433 06/10/17 0658 06/10/17 0702 06/10/17 0707  BP: 139/73 (!) 149/80 (!) 159/57   Pulse: 84 86 83   Resp: (!) 21 13 20 17   Temp: 97.7 F (36.5 C) 97.7 F (36.5 C)    TempSrc: Oral Oral    SpO2: 100% 100%    Weight: 127 lb 6.8 oz (57.8 kg) 127 lb 13.9 oz (58 kg)    Height:        Intake/Output Summary (Last 24 hours) at 06/10/17 0835 Last data filed at 06/10/17 0434  Gross per 24 hour  Intake              500 ml  Output                0 ml  Net               500 ml   Filed Weights   06/09/17 0332 06/10/17 0433 06/10/17 0658  Weight: 125 lb 14.1 oz (57.1 kg) 127 lb 6.8 oz (57.8 kg) 127 lb 13.9 oz (58 kg)    Telemetry    NSR - Personally Reviewed  ECG    No new EKG to review - Personally Reviewed  Physical Exam   GEN: No acute distress.   Neck: No JVD Cardiac: RRR, no murmurs, rubs, or gallops.  Respiratory: Clear to auscultation bilaterally. GI: Soft, nontender, non-distended  MS: No edema; No deformity. Neuro:  Nonfocal  Psych: Normal affect   Labs    Chemistry Recent Labs Lab 06/17/2017 1014  06/08/17 1407 06/09/17 0430 06/09/17 1442 06/10/17 0342  NA 139  < > 138 133* 132* 130*  K 4.6  < > 4.7 4.6 5.3* 5.7*  CL 88*  < >  86* 92* 91* 90*  CO2 27  --  31 24 20* 19*  GLUCOSE 282*  < > 152* 63* 140* 110*  BUN 44*  < > 69* 38* 50* 65*  CREATININE 11.71*  < > 14.18* 8.49* 9.97* 11.03*  CALCIUM 9.8  --  9.1 8.3* 8.3* 8.4*  PROT 9.3*  --   --   --  8.8*  --   ALBUMIN 4.2  --  4.0  --  4.0 3.9  AST 23  --   --   --  25  --   ALT 12*  --   --   --  13*  --   ALKPHOS 56  --   --   --  56  --   BILITOT 0.9  --   --   --  QUANTITY NOT SUFFICIENT, UNABLE TO PERFORM TEST  --   GFRNONAA 4*  --  3* 5* 4* 4*  GFRAA 4*  --  3* 6* 5* 4*  ANIONGAP 24*  --  21* 17* 21* 21*  < > = values in this interval not displayed.   Hematology Recent Labs Lab 06/26/2017 1014 06/12/2017 1047 06/09/17 1442 06/10/17 0342  WBC 13.9*  --  13.6* 13.2*  RBC 4.30  --  4.14* 4.50  HGB 15.4 18.4* 15.0 15.9  HCT 45.9 54.0* 44.1 46.9  MCV 106.7*  --  106.5* 104.2*  MCH 35.8*  --  36.2* 35.3*  MCHC 33.6  --  34.0 33.9  RDW 17.0*  --  17.4* 17.1*  PLT 235  --  197 202    Cardiac Enzymes Recent Labs Lab 06/22/2017 1846 06/14/2017 2136 06/08/17 0017  TROPONINI 0.09* 0.08* 0.08*    Recent Labs Lab 06/08/2017 1030  TROPIPOC 0.02     BNPNo results for input(s): BNP, PROBNP in the last 168 hours.   DDimer No results for input(s): DDIMER in the  last 168 hours.   Radiology    Dg Abd 1 View  Result Date: 06/09/2017 CLINICAL DATA:  Multi enters four-day loose, history of constipation EXAM: ABDOMEN - 1 VIEW COMPARISON:  None. FINDINGS: As a of the abdomen shows no obstruction. Gas is seen to the rectum. Only a mild to moderate amount of feces is noted throughout the colon. No opaque calculi noted. No acute bony abnormality is seen. IMPRESSION: No bowel obstruction. Only mild to moderate amount of feces is present throughout the colon. Electronically Signed   By: Ivar Drape M.D.   On: 06/09/2017 13:27    Cardiac Studies   2D echo 06/09/2017 Study Conclusions  - Left ventricle: The cavity size was normal. Wall thickness was   increased in a pattern of moderate LVH. Systolic function was   normal. The estimated ejection fraction was in the range of 55%   to 60%. Wall motion was normal; there were no regional wall   motion abnormalities. Doppler parameters are consistent with   abnormal left ventricular relaxation (grade 1 diastolic   dysfunction). - Mitral valve: Calcified annulus.  Impressions:  - Technically difficult; definity used; normal LV systolic   function; moderate LVH; mild diastolic dysfunction.  Patient Profile     76 y.o. male with a hx of HTN, HLD, DM and ESRD on HD and was seen by Dr. Radford Pax in 2015 for SOB with Dialysis, his nuc was normal and Echo with normal EF and mildly leaky AV. He is RBKA for PAD and ulcer. He has dialysis MWF. Initially  he  had nausea after getting hot and also complained of chest pain and SOB. These symptoms would come and go but by Sat night he was vomiting- brown liquid. His wife notes that he is confused at home at times. Here he is pulling off clothes at times. He complains of severe pain and then no pain.   Assessment & Plan    1. Chest pain with abnormal EKG concerning for ischemia but could also be due to subendocardial ischemia from HTN urgency that he presented with.  -  Trop mildly elevated but flat trend and likely related to demand ischemia in the setting of HTN urgency as well as ESRD. - started Imdur 30mg  daily and no complaints of CP.  - continue  ASA 81mg  daily and Plavix 75mg  daily. - continue carvedilol 6.25mg  BID - continue statin - 2D echo with normal LVF -  would pursue medical management at this time and does not remember having any chest pain.   - will set up for outpt nuclear stress test  2. HTN  - May have been increased with aggitation. - BP borderline controlled - continue carvedilol 6.25mg  BID, Catapres 0.2mg  TID, doxazosin 8mg  daily.  3. HLD  - continue statin - LDL at goal at 64  4.  ESRD on HD - per renal  5.  DM  - per IM  6.  Prolonged QTc - I will stop zofran - consider D/C of Seroquel and other QT prolonging drugs  No new recs at this time. Will set up for outpt nuclear stress test.  Will sign off.  Call with any questions.   For questions or updates, please contact [place] Please consult [email or web address] for contact info under Cardiology/STEMI.      Signed, Fransico Him, MD  06/10/2017, 8:35 AM

## 2017-06-10 NOTE — Progress Notes (Signed)
Rehab Admissions Coordinator Note:  Patient was screened by Cleatrice Burke for appropriateness for an Inpatient Acute Rehab Consult per PT recommendation. Noted pt is planned for discharge today.   At this time, we are recommending Inpatient Rehab consult if medically justified to remain in hospital for consultation.   Cleatrice Burke 06/10/2017, 12:44 PM  I can be reached at [phone].

## 2017-06-10 NOTE — Discharge Summary (Signed)
Physician Discharge Summary  Gerald Davenport HYW:[id number] DOB: 07-13-1941 DOA: 06/18/2017  PCP: Alroy Dust, L.Marlou Sa, MD  Admit date: 05/30/2017 Discharge date: 06/10/2017  Admitted From: Home Disposition:  Home  Recommendations for Outpatient Follow-up:  1. Follow up with PCP in 1- weeks 2. Clonidine has been discontinued, patient has been placed on coreg, hydralazine and isosorbide 3. Aspirin was added to clopidogrel 4. Calcitriol added, to take on hemodialysis days  Home Health: NA Equipment/Devices:NA   Discharge Condition: Stable CODE STATUS: Full  Diet recommendation: Cardiac, diabetic and renal  Brief/Interim Summary: 75 yo male who presented for chest pain. Patient is known to have end-stage renal disease on hemodialysis, type 2 diabetes mellitus, hypertension, dyslipidemia, and peripheral vascular disease with right BKA. He developed severe substernal chest pain, without radiation, improving with sublingual nitroglycerin, by the time he reached the emergency department he was chest pain-free. On his initial physical examination blood pressure 204/94, heart rate 105, temperature 97.8, respiratory rate 20, oxygen saturation 99%. Moist mucous membranes, heart S1-S2 present and rhythmic, no gallop, rubs or murmurs, lungs were clear to auscultation bilaterally, no wheezing, rales or rhonchi, abdomen was soft, nontender, nondistended, no lower extremity edema. Sodium 137, potassium 4.5, chloride 91, glucose 21, BUN 51, creatinine 11.5, troponin 0.09, white count 13.9, hemoglobin 15.4, hematocrit 45.9, platelets 235, chest x-ray with right internal jougular hemodialysis catheter in place, no effusions, infiltrates or signs of pneumothorax. EKG sinus rhythm, rate 111 bpm, positive ST depressions in the inferior lateral leads, more pronounced that baseline.  Patient admitted to the hospital with the working diagnosis of chest pain, to rule out non-ST elevation myocardial infarction.  1. Atypical  chest pain. Patient was admitted to the medical floor, he was placed in the remote telemetry monitor, he remained chest pain-free, initially started on a nitroglycerin drip that was successfully discontinued. Cardiac enzymes remained flat, no further EKG changes, patient had echocardiography with normal systolic LV function, no wall motion abnormalities, he ruled out for acute coronary syndrome. His EKG changes were attributed to uncontrol hypertension, repolarization changes. Patient will continue to all the therapy with aspirin and Plavix. Statin therapy.  2. Uncontrolled hypertension. Patient was placed on hydralazine and isosorbide, continue carvedilol, clonidine was discontinued.blood pressure improved with systolic 485 to 462. Patient was instructed about compliance with his medical therapy.  3. End-stage renal disease on hemodialysis. Patient received renal replacement therapy during his hospitalization with no major complications. Continue Sevelamer, Cinalcet, phsolo and calcitriol. Serum phosphorus 8 was calcium 8.4.  4. Type 2 diabetes mellitus. Patient was based on insulin sliding scale for glucose coverage and monitoring, basal insulin with Levemir 10 units daily at bedtime, capillary glucose 139, 85, 130, 146, 170, 133 over last 24 hours.  5. GERD. Continue antiacid therapy with pantoprazole.  6. Depression. Stable, no confusion or agitation, continue bupropion and Seroquel.      Discharge Diagnoses:  Principal Problem:   Chest pain Active Problems:   Type 2 diabetes mellitus with ESRD (end-stage renal disease) (HCC)   GERD (gastroesophageal reflux disease)   T wave inversion in EKG   ESRD on dialysis [facility])   Hypertensive urgency    Discharge Instructions   Allergies as of 06/10/2017   No Known Allergies     Medication List    STOP taking these medications   cloNIDine 0.2 MG tablet Commonly known as:  CATAPRES   feeding supplement (NEPRO CARB STEADY) Liqd      TAKE these medications   aspirin 81 MG  EC tablet Take 1 tablet (81 mg total) by mouth daily.   buPROPion 150 MG 24 hr tablet Commonly known as:  WELLBUTRIN XL Take 150 mg by mouth daily.   calcitRIOL 0.25 MCG capsule Commonly known as:  ROCALTROL Take 3 capsules (0.75 mcg total) by mouth every Monday, Wednesday, and Friday with hemodialysis.   calcium acetate 667 MG capsule Commonly known as:  PHOSLO Take 667-2,001 mg by mouth See admin instructions. Take 3 capsules (2001 mg) by mouth three times daily with meals and 1 capsule (667 mg) with snacks   carboxymethylcellulose 0.5 % Soln Commonly known as:  REFRESH PLUS Place 1 drop into both eyes 3 (three) times daily as needed (dry eyes/ itching).   carvedilol 6.25 MG tablet Commonly known as:  COREG Take 1 tablet (6.25 mg total) by mouth 2 (two) times daily with a meal.   cinacalcet 30 MG tablet Commonly known as:  SENSIPAR Take 30 mg by mouth daily with breakfast.   clopidogrel 75 MG tablet Commonly known as:  PLAVIX Take 1 tablet (75 mg total) by mouth daily.   doxazosin 8 MG tablet Commonly known as:  CARDURA Take 8 mg by mouth daily.   fluorometholone 0.1 % ophthalmic suspension Commonly known as:  FML Place 1 drop into both eyes at bedtime.   furosemide 40 MG tablet Commonly known as:  LASIX Take 40 mg by mouth 2 (two) times daily.   gabapentin 100 MG capsule Commonly known as:  NEURONTIN Take 100 mg by mouth 2 (two) times daily.   hydrALAZINE 25 MG tablet Commonly known as:  APRESOLINE Take 1 tablet (25 mg total) by mouth 2 (two) times daily.   isosorbide mononitrate 30 MG 24 hr tablet Commonly known as:  IMDUR Take 1 tablet (30 mg total) by mouth daily.   LEVEMIR FLEXTOUCH 100 UNIT/ML Pen Generic drug:  Insulin Detemir Inject 10 Units into the skin daily before breakfast.   linaclotide 145 MCG Caps capsule Commonly known as:  LINZESS Take 1 capsule (145 mcg total) by mouth daily. Take 30 minutes  before breakfast What changed:  when to take this  additional instructions   METAMUCIL PO Take by mouth See admin instructions. Mix 1 capful in 8 oz liquid and drink daily as needed for constipation   omeprazole 20 MG capsule Commonly known as:  PRILOSEC Take 20 mg by mouth daily.   ONGLYZA 5 MG Tabs tablet Generic drug:  saxagliptin HCl Take 5 mg by mouth daily.   QUEtiapine 200 MG tablet Commonly known as:  SEROQUEL Take 200 mg by mouth at bedtime.   sevelamer carbonate 800 MG tablet Commonly known as:  RENVELA Take 1,600 mg by mouth 3 (three) times daily with meals.   simvastatin 20 MG tablet Commonly known as:  ZOCOR Take 20 mg by mouth at bedtime.            Discharge Care Instructions        Start     Ordered   06/10/17 0000  aspirin 81 MG EC tablet  Daily     06/10/17 1021   06/10/17 0000  carvedilol (COREG) 6.25 MG tablet  2 times daily with meals     06/10/17 1021   06/10/17 0000  calcitRIOL (ROCALTROL) 0.25 MCG capsule  Every M-W-F (Hemodialysis)     06/10/17 1021   06/10/17 0000  hydrALAZINE (APRESOLINE) 25 MG tablet  2 times daily     06/10/17 1021   06/10/17 0000  isosorbide  mononitrate (IMDUR) 30 MG 24 hr tablet  Daily     06/10/17 1021   06/10/17 0000  Increase activity slowly     06/10/17 1021   06/10/17 0000  Diet - low sodium heart healthy     06/10/17 1021   06/10/17 0000  Discharge instructions    Comments:  Please follow with primary care in 7 days.   06/10/17 1021      No Known Allergies  Consultations:  Cardiology    Procedures/Studies: Dg Chest 2 View  Result Date: 06/02/2017 CLINICAL DATA:  Chest pain, nausea and vomiting. Coffee ground emesis per family. On dialysis. EXAM: CHEST  2 VIEW COMPARISON:  Chest x-ray dated 01/23/2017. FINDINGS: Cardiomediastinal silhouette is within normal limits. Atherosclerotic changes noted at the aortic arch. Lungs are clear. No pleural effusion or pneumothorax seen. Right-sided dialysis  catheter appears appropriately position. No acute or suspicious osseous finding. IMPRESSION: No active cardiopulmonary disease. No evidence of pneumonia or pulmonary edema. Aortic atherosclerosis. Electronically Signed   By: Franki Cabot M.D.   On: 06/28/2017 10:32   Dg Abd 1 View  Result Date: 06/09/2017 CLINICAL DATA:  Multi enters four-day loose, history of constipation EXAM: ABDOMEN - 1 VIEW COMPARISON:  None. FINDINGS: As a of the abdomen shows no obstruction. Gas is seen to the rectum. Only a mild to moderate amount of feces is noted throughout the colon. No opaque calculi noted. No acute bony abnormality is seen. IMPRESSION: No bowel obstruction. Only mild to moderate amount of feces is present throughout the colon. Electronically Signed   By: Ivar Drape M.D.   On: 06/09/2017 13:27       Subjective: Patient feeling better, mild abdominal pain with no nausea or vomiting, no chest pain or dyspnea.   Discharge Exam: Vitals:   06/10/17 0800 06/10/17 0830  BP: 135/78 (!) 154/44  Pulse: 90 88  Resp: 16 18  Temp:    SpO2:     Vitals:   06/10/17 0707 06/10/17 0730 06/10/17 0800 06/10/17 0830  BP:  132/68 135/78 (!) 154/44  Pulse:  89 90 88  Resp: 17 16 16 18   Temp:      TempSrc:      SpO2:      Weight:      Height:        General: Pt is alert, awake, not in acute distress E ENT: mild pallor, no icterus, oral mucosa moist.  Cardiovascular: RRR, S1/S2 +, no rubs, no gallops Respiratory: CTA bilaterally, no wheezing, no rhonchi Abdominal: Soft, NT, ND, bowel sounds + Extremities: no edema, no cyanosis, right BKA.     The results of significant diagnostics from this hospitalization (including imaging, microbiology, ancillary and laboratory) are listed below for reference.     Microbiology: Recent Results (from the past 240 hour(s))  MRSA PCR Screening     Status: None   Collection Time: 06/21/2017  6:55 PM  Result Value Ref Range Status   MRSA by PCR NEGATIVE NEGATIVE  Final    Comment:        The GeneXpert MRSA Assay (FDA approved for NASAL specimens only), is one component of a comprehensive MRSA colonization surveillance program. It is not intended to diagnose MRSA infection nor to guide or monitor treatment for MRSA infections.      Labs: BNP (last 3 results) No results for input(s): BNP in the last 8760 hours. Basic Metabolic Panel:  Recent Labs Lab 06/20/2017 1014 06/01/2017 1047 06/08/17 1407 06/09/17 0430 06/09/17  1442 06/10/17 0342  NA 139 137 138 133* 132* 130*  K 4.6 4.5 4.7 4.6 5.3* 5.7*  CL 88* 91* 86* 92* 91* 90*  CO2 27  --  31 24 20* 19*  GLUCOSE 282* 281* 152* 63* 140* 110*  BUN 44* 51* 69* 38* 50* 65*  CREATININE 11.71* 11.50* 14.18* 8.49* 9.97* 11.03*  CALCIUM 9.8  --  9.1 8.3* 8.3* 8.4*  PHOS  --   --  8.0* 5.9*  --  8.8*   Liver Function Tests:  Recent Labs Lab 06/02/2017 1014 06/08/17 1407 06/09/17 1442 06/10/17 0342  AST 23  --  25  --   ALT 12*  --  13*  --   ALKPHOS 56  --  56  --   BILITOT 0.9  --  QUANTITY NOT SUFFICIENT, UNABLE TO PERFORM TEST  --   PROT 9.3*  --  8.8*  --   ALBUMIN 4.2 4.0 4.0 3.9    Recent Labs Lab 05/30/2017 1014  LIPASE 47   No results for input(s): AMMONIA in the last 168 hours. CBC:  Recent Labs Lab 06/28/2017 1014 06/14/2017 1047 06/09/17 1442 06/10/17 0342  WBC 13.9*  --  13.6* 13.2*  NEUTROABS 12.2*  --  11.1* 10.6*  HGB 15.4 18.4* 15.0 15.9  HCT 45.9 54.0* 44.1 46.9  MCV 106.7*  --  106.5* 104.2*  PLT 235  --  197 202   Cardiac Enzymes:  Recent Labs Lab 06/25/2017 1846 06/05/2017 2136 06/08/17 0017  TROPONINI 0.09* 0.08* 0.08*   BNP: Invalid input(s): POCBNP CBG:  Recent Labs Lab 06/09/17 0719 06/09/17 1121 06/09/17 1335 06/09/17 1610 06/09/17 2043  GLUCAP 85 130* 146* 170* 133*   D-Dimer No results for input(s): DDIMER in the last 72 hours. Hgb A1c No results for input(s): HGBA1C in the last 72 hours. Lipid Profile  Recent Labs   06/09/17 0430  CHOL 171  HDL 72  LDLCALC 64  TRIG 174*  CHOLHDL 2.4   Thyroid function studies No results for input(s): TSH, T4TOTAL, T3FREE, THYROIDAB in the last 72 hours.  Invalid input(s): FREET3 Anemia work up No results for input(s): VITAMINB12, FOLATE, FERRITIN, TIBC, IRON, RETICCTPCT in the last 72 hours. Urinalysis No results found for: COLORURINE, APPEARANCEUR, [place], [place], [place], [place], [place], [place], PROTEINUR, UROBILINOGEN, NITRITE, LEUKOCYTESUR Sepsis Labs Invalid input(s): PROCALCITONIN,  WBC,  LACTICIDVEN Microbiology Recent Results (from the past 240 hour(s))  MRSA PCR Screening     Status: None   Collection Time: 06/17/2017  6:55 PM  Result Value Ref Range Status   MRSA by PCR NEGATIVE NEGATIVE Final    Comment:        The GeneXpert MRSA Assay (FDA approved for NASAL specimens only), is one component of a comprehensive MRSA colonization surveillance program. It is not intended to diagnose MRSA infection nor to guide or monitor treatment for MRSA infections.      Time coordinating discharge: 45 minutes  SIGNED:   Tawni Millers, MD  [facility] 06/10/2017, 9:12 AM Pager [phone]  If 7PM-7AM, please contact night-coverage [email or web address] Password [email or web address]

## 2017-06-10 NOTE — Discharge Instructions (Signed)
For stress test 06/24/17 do not eat or drink after MN the night before the test , no caffeine for the day before test.

## 2017-06-10 NOTE — Evaluation (Signed)
Physical Therapy Evaluation Patient Details Name: Gerald Davenport MRN: [id number] DOB: 07-20-1941 Today's Date: 06/10/2017   History of Present Illness  76 y.o. male with a hx of HTN, HLD, DM and ESRD on HD and was seen by Gerald Davenport in 2015 for SOB with Dialysis, his nuc was normal and Echo with normal EF and mildly leaky AV.  He is RBKA for PAD and ulcer.   Clinical Impression  Pt admitted with above diagnosis. Pt currently with functional limitations due to the deficits listed below (see PT Problem List). Per chart review and discussion with RN, Gerald Davenport has been managing independenlty with his prosthesis prior to this admission; Today he presented very fatigued and weak, needing mod assist to poer up to stand, and needed RW for stability in standing; attempted orthostatics, but unable to get standing BP; This represents quite a functional decline, and I have safety concerns re: going home today; Discussed with Dr. Cathlean Davenport, and with Tine, RN;  Pt will benefit from skilled PT to increase their independence and safety with mobility to allow discharge to the venue listed below.       Follow Up Recommendations Other (comment) (Today, Gerald Davenport presents with a significant functional decline compared to baseline; Worth looking into post-acute rehab options; will place Rehab Screen; Still, if family and pt opt for going home, recommend HHPT/OT, and consider wheelchair -- per chart review, they had a ramp as of 12/17, it will need ot be confirmed they still have it)    Equipment Recommendations  Wheelchair (measurements PT);Wheelchair cushion (measurements PT);Hospital bed    Recommendations for Other Services OT consult     Precautions / Restrictions Precautions Precautions: Fall Restrictions Weight Bearing Restrictions: No      Mobility  Bed Mobility Overal bed mobility: Needs Assistance Bed Mobility: Rolling;Sidelying to Sit;Supine to Sit;Sit to Supine Rolling: Supervision Sidelying to sit:  Min assist Supine to sit: Min assist Sit to supine: Min assist   General bed mobility comments: Initially got to EOB straight from supine to sit, min handheld assist to pull to sit; better with rolling technqiue; Required min assist to help his legs back into bed  Transfers Overall transfer level: Needs assistance Equipment used: Rolling walker (2 wheeled) Transfers: Sit to/from Stand Sit to Stand: Mod assist         General transfer comment: Heavy mod assist to power up to stand; cues for hand placement and use of RW; very fatigued in standing, head and shoulders forward; unable to get a BP reading  Ambulation/Gait             General Gait Details: Too fatigued today  Stairs            Wheelchair Mobility    Modified Rankin (Stroke Patients Only)       Balance                                             Pertinent Vitals/Pain Pain Assessment: Faces Faces Pain Scale: Hurts a little bit Pain Location: Pt gestured towards lower abdomen, suprapubic area Pain Descriptors / Indicators: Grimacing Pain Intervention(s): Monitored during session;Repositioned    Home Living Family/patient expects to be discharged to:: Private residence Living Arrangements: Spouse/significant other Available Help at Discharge: Family;Available 24 hours/day Type of Home: House Home Access: Ramped entrance     Home Layout: One  level Home Equipment: Walker - 2 wheels;Cane - single point;Shower seat      Prior Function Level of Independence: Needs assistance   Gait / Transfers Assistance Needed: Pt uses prosthesis for ambulation, has a walker and cane but rarely uses     Comments: Per chart review, as of December of last year, Gerald Davenport was driving, unlimited community ambulator     Hand Dominance   Dominant Hand: Right    Extremity/Trunk Assessment   Upper Extremity Assessment Upper Extremity Assessment: Overall WFL for tasks assessed    Lower  Extremity Assessment Lower Extremity Assessment: Generalized weakness;RLE deficits/detail RLE Deficits / Details: Below the knee amputation; Required assist to don his prosthesis, tells me his wife assists him as needed       Communication   Communication: Expressive difficulties (slurred speech/difficult to understand at baseline)  Cognition Arousal/Alertness: Awake/alert (Sleepy) Behavior During Therapy: WFL for tasks assessed/performed Overall Cognitive Status: No family/caregiver present to determine baseline cognitive functioning                                 General Comments: Difficult to understand      General Comments General comments (skin integrity, edema, etc.):   06/10/17 1145 06/10/17 1155 06/10/17 1157  Vital Signs  Patient Position (if appropriate) Orthostatic Vitals --  --   Orthostatic Lying   BP- Lying 140/79 --  149/64  Pulse- Lying 102 --  99  Orthostatic Sitting  BP- Sitting 114/83 121/69 --   Pulse- Sitting 102 102 --   Orthostatic Standing at 0 minutes  Pulse- Standing at 0 minutes (Attempted; BP did not read) --  --        Exercises     Assessment/Plan    PT Assessment Patient needs continued PT services  PT Problem List Decreased strength;Decreased activity tolerance;Decreased balance;Decreased mobility;Decreased cognition;Decreased knowledge of use of DME;Decreased safety awareness;Decreased knowledge of precautions       PT Treatment Interventions DME instruction;Gait training;Stair training;Functional mobility training;Therapeutic activities;Therapeutic exercise;Balance training;Cognitive remediation;Patient/family education    PT Goals (Current goals can be found in the Care Plan section)  Acute Rehab PT Goals Patient Stated Goal: go home PT Goal Formulation: With patient Time For Goal Achievement: 06/24/17 Potential to Achieve Goals: Good    Frequency Min 3X/week   Barriers to discharge Decreased caregiver  support Pt lives with wife, who uses a cane herself    Co-evaluation               AM-PAC PT "6 Clicks" Daily Activity  Outcome Measure Difficulty turning over in bed (including adjusting bedclothes, sheets and blankets)?: A Little Difficulty moving from lying on back to sitting on the side of the bed? : A Little Difficulty sitting down on and standing up from a chair with arms (e.g., wheelchair, bedside commode, etc,.)?: Unable Help needed moving to and from a bed to chair (including a wheelchair)?: A Lot Help needed walking in hospital room?: A Lot Help needed climbing 3-5 steps with a railing? : Total 6 Click Score: 12    End of Session Equipment Utilized During Treatment: Gait belt;Other (comment) (R prosthesis) Activity Tolerance: Patient limited by fatigue Patient left: in bed;with call bell/phone within reach;with bed alarm set Nurse Communication: Mobility status;Other (comment) (Concern re: safety with dc home) PT Visit Diagnosis: Unsteadiness on feet (R26.81);Other abnormalities of gait and mobility (R26.89);Muscle weakness (generalized) (M62.81)    Time: 5638-7564  PT Time Calculation (min) (ACUTE ONLY): 32 min   Charges:   PT Evaluation $PT Eval Moderate Complexity: 1 Mod PT Treatments $Therapeutic Activity: 8-22 mins   PT G Codes:        Roney Marion, PT  Acute Rehabilitation Services Pager [phone] Office [phone]   Colletta [place] 06/10/2017, 12:34 PM

## 2017-06-10 NOTE — Progress Notes (Addendum)
Inpatient Rehabilitation  Received call from [facility] nurse case manager who was discussing discharge plan with patient and spouse.  Spouse is unable to currently care for patient at his current level of functioning and as a result, they are in favor of IP Rehab for post acute therapies.  Will initiate insurance authorization for likely admission tomorrow.  Plan to follow up with patient and spouse when I have an insurance decision.  Please call with questions.   Carmelia Roller., CCC/SLP Admission Coordinator  Joy  Cell [phone]

## 2017-06-10 NOTE — Clinical Social Work Note (Signed)
Clinical Social Work Assessment  Patient Details  Name: Gerald Davenport MRN: [id number] Date of Birth: 1941/03/10  Date of referral:  06/10/17               Reason for consult:  Facility Placement                Permission sought to share information with:  Family Supports Permission granted to share information::     Name::     [place]::     Relationship::  spouse  Contact Information:  [phone]  Housing/Transportation Living arrangements for the past 2 months:  Single Family Home Source of Information:  Patient, Spouse Patient Interpreter Needed:  None Criminal Activity/Legal Involvement Pertinent to Current Situation/Hospitalization:  No - Comment as needed Significant Relationships:  Spouse Lives with:  Spouse Do you feel safe going back to the place where you live?  Yes Need for family participation in patient care:  No (Coment)  Care giving concerns: Patient from home with wife. Has BKA and uses prosthetic. PT recommending SNF.   Social Worker assessment / plan: CSW and RNCM met with patient and wife at bedside. Patient lethargic and somewhat difficult to understand, but refusing SNF. Patient preoccupied with fact that medical team indicated he would discharge home today. CSW and RNCM explained that problems arose with his HD cath and patient now has to finish HD treatment before going home. CSW reflected on patient's decreased mobility and deconditioning during admission and wife indicated she cannot manage patient in current state at home. Patient continued to refuse SNF. CIR also following. CSW to continue to follow and support with disposition planning as appropriate.  Employment status:    Insurance information:  Programmer, applications [facility]) PT Recommendations:  Rew / Referral to community resources:  [place]  Patient/Family's Response to care: Patient's wife appreciative of care.  Patient/Family's Understanding  of and Emotional Response to Diagnosis, Current Treatment, and Prognosis:  Patient with moderate understanding of his condition, with some misunderstanding of issues with HD catheter. Patient with poor insight or denial of mobility deconditioning. Wife and patient with some misunderstandings of discharge process to SNF. CSW clarified. Wife hopeful for SNF placement, though patient continuing to refuse.   Emotional Assessment Appearance:  Appears stated age Attitude/Demeanor/Rapport:  Lethargic Affect (typically observed):  In denial Orientation:  Oriented to Self, Oriented to Place, Oriented to  Time, Oriented to Situation Alcohol / Substance use:  Not Applicable Psych involvement (Current and /or in the community):  No (Comment)  Discharge Needs  Concerns to be addressed:  Discharge Planning Concerns Readmission within the last 30 days:  No Current discharge risk:  Physical Impairment, Dependent with Mobility Barriers to Discharge:  Continued Medical Work up   Estanislado Emms, LCSW 06/10/2017, 4:38 PM

## 2017-06-10 NOTE — Procedures (Signed)
I was present at this session.  I have reviewed the session itself and made appropriate changes.  Poor cath flow, bp Marland Kitchen net neg  [name] L 9/12/20188:47 AM

## 2017-06-11 ENCOUNTER — Inpatient Hospital Stay (HOSPITAL_COMMUNITY): Payer: Medicare Other

## 2017-06-11 ENCOUNTER — Telehealth: Payer: Self-pay | Admitting: Vascular Surgery

## 2017-06-11 ENCOUNTER — Encounter (HOSPITAL_COMMUNITY): Payer: Self-pay | Admitting: Cardiovascular Disease

## 2017-06-11 ENCOUNTER — Inpatient Hospital Stay (HOSPITAL_COMMUNITY): Payer: Medicare Other | Admitting: Anesthesiology

## 2017-06-11 ENCOUNTER — Inpatient Hospital Stay (HOSPITAL_COMMUNITY): Admission: EM | Disposition: E | Payer: Self-pay | Source: Home / Self Care | Attending: Internal Medicine

## 2017-06-11 DIAGNOSIS — J9600 Acute respiratory failure, unspecified whether with hypoxia or hypercapnia: Secondary | ICD-10-CM

## 2017-06-11 DIAGNOSIS — I2510 Atherosclerotic heart disease of native coronary artery without angina pectoris: Secondary | ICD-10-CM

## 2017-06-11 DIAGNOSIS — I469 Cardiac arrest, cause unspecified: Secondary | ICD-10-CM

## 2017-06-11 DIAGNOSIS — I200 Unstable angina: Secondary | ICD-10-CM

## 2017-06-11 DIAGNOSIS — R4189 Other symptoms and signs involving cognitive functions and awareness: Secondary | ICD-10-CM

## 2017-06-11 DIAGNOSIS — G9340 Encephalopathy, unspecified: Secondary | ICD-10-CM

## 2017-06-11 HISTORY — PX: CORONARY STENT INTERVENTION: CATH118234

## 2017-06-11 HISTORY — PX: LEFT HEART CATH AND CORONARY ANGIOGRAPHY: CATH118249

## 2017-06-11 LAB — BASIC METABOLIC PANEL
ANION GAP: 24 — AB (ref 5–15)
ANION GAP: 21 — AB (ref 5–15)
Anion gap: 15 (ref 5–15)
BUN: 61 mg/dL — ABNORMAL HIGH (ref 6–20)
BUN: 66 mg/dL — ABNORMAL HIGH (ref 6–20)
BUN: 64 mg/dL — AB (ref 6–20)
CALCIUM: 8.3 mg/dL — AB (ref 8.9–10.3)
CO2: 15 mmol/L — AB (ref 22–32)
CO2: 17 mmol/L — ABNORMAL LOW (ref 22–32)
CO2: 18 mmol/L — ABNORMAL LOW (ref 22–32)
Calcium: 10.1 mg/dL (ref 8.9–10.3)
Calcium: 8.2 mg/dL — ABNORMAL LOW (ref 8.9–10.3)
Chloride: 93 mmol/L — ABNORMAL LOW (ref 101–111)
Chloride: 98 mmol/L — ABNORMAL LOW (ref 101–111)
Chloride: 100 mmol/L — ABNORMAL LOW (ref 101–111)
Creatinine, Ser: 8.61 mg/dL — ABNORMAL HIGH (ref 0.61–1.24)
Creatinine, Ser: 8.33 mg/dL — ABNORMAL HIGH (ref 0.61–1.24)
Creatinine, Ser: 7.94 mg/dL — ABNORMAL HIGH (ref 0.61–1.24)
GFR calc Af Amer: 6 mL/min — ABNORMAL LOW (ref >60)
GFR calc Af Amer: 6 mL/min — ABNORMAL LOW (ref >60)
GFR calc Af Amer: 7 mL/min — ABNORMAL LOW (ref >60)
GFR calc non Af Amer: 5 mL/min — ABNORMAL LOW (ref >60)
GFR calc non Af Amer: 6 mL/min — ABNORMAL LOW (ref >60)
GFR, EST NON AFRICAN AMERICAN: 5 mL/min — AB (ref >60)
GLUCOSE: 170 mg/dL — AB (ref 65–99)
GLUCOSE: 272 mg/dL — AB (ref 65–99)
Glucose, Bld: 101 mg/dL — ABNORMAL HIGH (ref 65–99)
POTASSIUM: 4.1 mmol/L (ref 3.5–5.1)
Potassium: 6.6 mmol/L (ref 3.5–5.1)
Potassium: 4.7 mmol/L (ref 3.5–5.1)
SODIUM: 136 mmol/L (ref 135–145)
SODIUM: 133 mmol/L — AB (ref 135–145)
Sodium: 132 mmol/L — ABNORMAL LOW (ref 135–145)

## 2017-06-11 LAB — POCT I-STAT, CHEM 8
BUN: 57 mg/dL — AB (ref 6–20)
BUN: 53 mg/dL — AB (ref 6–20)
BUN: 55 mg/dL — ABNORMAL HIGH (ref 6–20)
BUN: 56 mg/dL — AB (ref 6–20)
BUN: 68 mg/dL — AB (ref 6–20)
CALCIUM ION: 1.32 mmol/L (ref 1.15–1.40)
CALCIUM ION: 1.15 mmol/L (ref 1.15–1.40)
CALCIUM ION: 1.08 mmol/L — AB (ref 1.15–1.40)
CALCIUM ION: 1.03 mmol/L — AB (ref 1.15–1.40)
CHLORIDE: 98 mmol/L — AB (ref 101–111)
CREATININE: 8.7 mg/dL — AB (ref 0.61–1.24)
CREATININE: 7.3 mg/dL — AB (ref 0.61–1.24)
CREATININE: 7.9 mg/dL — AB (ref 0.61–1.24)
CREATININE: 8.1 mg/dL — AB (ref 0.61–1.24)
Calcium, Ion: 1.15 mmol/L (ref 1.15–1.40)
Chloride: 102 mmol/L (ref 101–111)
Chloride: 99 mmol/L — ABNORMAL LOW (ref 101–111)
Chloride: 100 mmol/L — ABNORMAL LOW (ref 101–111)
Chloride: 99 mmol/L — ABNORMAL LOW (ref 101–111)
Creatinine, Ser: 8 mg/dL — ABNORMAL HIGH (ref 0.61–1.24)
GLUCOSE: 280 mg/dL — AB (ref 65–99)
GLUCOSE: 59 mg/dL — AB (ref 65–99)
Glucose, Bld: 140 mg/dL — ABNORMAL HIGH (ref 65–99)
Glucose, Bld: 50 mg/dL — ABNORMAL LOW (ref 65–99)
Glucose, Bld: 82 mg/dL (ref 65–99)
HCT: 32 % — ABNORMAL LOW (ref 39.0–52.0)
HCT: 28 % — ABNORMAL LOW (ref 39.0–52.0)
HCT: 36 % — ABNORMAL LOW (ref 39.0–52.0)
HEMATOCRIT: 30 % — AB (ref 39.0–52.0)
HEMATOCRIT: 35 % — AB (ref 39.0–52.0)
HEMOGLOBIN: 9.5 g/dL — AB (ref 13.0–17.0)
HEMOGLOBIN: 10.2 g/dL — AB (ref 13.0–17.0)
HEMOGLOBIN: 12.2 g/dL — AB (ref 13.0–17.0)
HEMOGLOBIN: 11.9 g/dL — AB (ref 13.0–17.0)
Hemoglobin: 10.9 g/dL — ABNORMAL LOW (ref 13.0–17.0)
POTASSIUM: 4.1 mmol/L (ref 3.5–5.1)
POTASSIUM: 4 mmol/L (ref 3.5–5.1)
Potassium: 3.9 mmol/L (ref 3.5–5.1)
Potassium: 4.7 mmol/L (ref 3.5–5.1)
Potassium: 5.3 mmol/L — ABNORMAL HIGH (ref 3.5–5.1)
SODIUM: 140 mmol/L (ref 135–145)
SODIUM: 138 mmol/L (ref 135–145)
Sodium: 137 mmol/L (ref 135–145)
Sodium: 136 mmol/L (ref 135–145)
Sodium: 136 mmol/L (ref 135–145)
TCO2: 24 mmol/L (ref 22–32)
TCO2: 20 mmol/L — AB (ref 22–32)
TCO2: 22 mmol/L (ref 22–32)
TCO2: 21 mmol/L — AB (ref 22–32)
TCO2: 24 mmol/L (ref 22–32)

## 2017-06-11 LAB — HEPATITIS B SURFACE ANTIGEN: HEP B S AG: NEGATIVE

## 2017-06-11 LAB — GLUCOSE, CAPILLARY
GLUCOSE-CAPILLARY: 219 mg/dL — AB (ref 65–99)
GLUCOSE-CAPILLARY: 196 mg/dL — AB (ref 65–99)
GLUCOSE-CAPILLARY: 184 mg/dL — AB (ref 65–99)
GLUCOSE-CAPILLARY: 112 mg/dL — AB (ref 65–99)
GLUCOSE-CAPILLARY: 85 mg/dL (ref 65–99)
Glucose-Capillary: 126 mg/dL — ABNORMAL HIGH (ref 65–99)
Glucose-Capillary: 49 mg/dL — ABNORMAL LOW (ref 65–99)
Glucose-Capillary: 99 mg/dL (ref 65–99)
Glucose-Capillary: 79 mg/dL (ref 65–99)
Glucose-Capillary: 59 mg/dL — ABNORMAL LOW (ref 65–99)
Glucose-Capillary: 217 mg/dL — ABNORMAL HIGH (ref 65–99)

## 2017-06-11 LAB — POCT I-STAT 3, ART BLOOD GAS (G3+)
Acid-Base Excess: 1 mmol/L (ref 0.0–2.0)
Acid-base deficit: 8 mmol/L — ABNORMAL HIGH (ref 0.0–2.0)
Acid-base deficit: 3 mmol/L — ABNORMAL HIGH (ref 0.0–2.0)
BICARBONATE: 16.2 mmol/L — AB (ref 20.0–28.0)
BICARBONATE: 26.4 mmol/L (ref 20.0–28.0)
Bicarbonate: 21.5 mmol/L (ref 20.0–28.0)
O2 Saturation: 100 %
O2 Saturation: 100 %
O2 Saturation: 100 %
PCO2 ART: 26.8 mmHg — AB (ref 32.0–48.0)
PCO2 ART: 28.7 mmHg — AB (ref 32.0–48.0)
PH ART: 7.467 — AB (ref 7.350–7.450)
PO2 ART: 462 mmHg — AB (ref 83.0–108.0)
PO2 ART: 588 mmHg — AB (ref 83.0–108.0)
Patient temperature: 97.2
Patient temperature: 97
TCO2: 17 mmol/L — AB (ref 22–32)
TCO2: 28 mmol/L (ref 22–32)
TCO2: 23 mmol/L (ref 22–32)
pCO2 arterial: 42 mmHg (ref 32.0–48.0)
pH, Arterial: 7.387 (ref 7.350–7.450)
pH, Arterial: 7.402 (ref 7.350–7.450)
pO2, Arterial: 334 mmHg — ABNORMAL HIGH (ref 83.0–108.0)

## 2017-06-11 LAB — HEPATIC FUNCTION PANEL
ALT: 106 U/L — ABNORMAL HIGH (ref 17–63)
AST: 162 U/L — ABNORMAL HIGH (ref 15–41)
Albumin: 2.5 g/dL — ABNORMAL LOW (ref 3.5–5.0)
Alkaline Phosphatase: 45 U/L (ref 38–126)
BILIRUBIN INDIRECT: 0.6 mg/dL (ref 0.3–0.9)
Bilirubin, Direct: 0.2 mg/dL (ref 0.1–0.5)
TOTAL PROTEIN: 5.8 g/dL — AB (ref 6.5–8.1)
Total Bilirubin: 0.8 mg/dL (ref 0.3–1.2)

## 2017-06-11 LAB — RAPID HIV SCREEN (HIV 1/2 AB+AG)
HIV 1/2 Antibodies: NONREACTIVE
HIV-1 P24 Antigen - HIV24: NONREACTIVE

## 2017-06-11 LAB — CBC WITH DIFFERENTIAL/PLATELET
BASOS PCT: 0 %
Basophils Absolute: 0 10*3/uL (ref 0.0–0.1)
Eosinophils Absolute: 0 10*3/uL (ref 0.0–0.7)
Eosinophils Relative: 0 %
HEMATOCRIT: 40.2 % (ref 39.0–52.0)
Hemoglobin: 13.6 g/dL (ref 13.0–17.0)
LYMPHS ABS: 1.2 10*3/uL (ref 0.7–4.0)
Lymphocytes Relative: 8 %
MCH: 35.1 pg — AB (ref 26.0–34.0)
MCHC: 33.8 g/dL (ref 30.0–36.0)
MCV: 103.9 fL — AB (ref 78.0–100.0)
MONO ABS: 1.9 10*3/uL — AB (ref 0.1–1.0)
MONOS PCT: 13 %
NEUTROS ABS: 11.8 10*3/uL — AB (ref 1.7–7.7)
Neutrophils Relative %: 79 %
Platelets: 188 10*3/uL (ref 150–400)
RBC: 3.87 MIL/uL — ABNORMAL LOW (ref 4.22–5.81)
RDW: 17.4 % — AB (ref 11.5–15.5)
WBC: 14.9 10*3/uL — ABNORMAL HIGH (ref 4.0–10.5)

## 2017-06-11 LAB — TROPONIN I
TROPONIN I: 0.24 ng/mL — AB (ref <0.03)
Troponin I: 0.04 ng/mL
Troponin I: 0.04 ng/mL (ref <0.03)

## 2017-06-11 LAB — PROTIME-INR
INR: 1.49
INR: 5.37
INR: 2.69
PROTHROMBIN TIME: 17.9 s — AB (ref 11.4–15.2)
Prothrombin Time: 48.7 seconds — ABNORMAL HIGH (ref 11.4–15.2)
Prothrombin Time: 28.4 seconds — ABNORMAL HIGH (ref 11.4–15.2)

## 2017-06-11 LAB — APTT
APTT: 50 s — AB (ref 24–36)
aPTT: 162 seconds (ref 24–36)
aPTT: 88 seconds — ABNORMAL HIGH (ref 24–36)

## 2017-06-11 LAB — PHOSPHORUS: PHOSPHORUS: 10.6 mg/dL — AB (ref 2.5–4.6)

## 2017-06-11 LAB — POCT ACTIVATED CLOTTING TIME
Activated Clotting Time: 379 seconds
Activated Clotting Time: 252 seconds
Activated Clotting Time: 224 seconds

## 2017-06-11 LAB — MAGNESIUM: MAGNESIUM: 1.9 mg/dL (ref 1.7–2.4)

## 2017-06-11 LAB — TRIGLYCERIDES: Triglycerides: 60 mg/dL (ref <150)

## 2017-06-11 LAB — LACTIC ACID, PLASMA
LACTIC ACID, VENOUS: 7 mmol/L — AB (ref 0.5–1.9)
LACTIC ACID, VENOUS: 2.1 mmol/L — AB (ref 0.5–1.9)

## 2017-06-11 LAB — HCV COMMENT:

## 2017-06-11 LAB — HEPATITIS C ANTIBODY (REFLEX)

## 2017-06-11 SURGERY — LEFT HEART CATH AND CORONARY ANGIOGRAPHY
Anesthesia: LOCAL

## 2017-06-11 MED ORDER — BIVALIRUDIN TRIFLUOROACETATE 250 MG IV SOLR
INTRAVENOUS | Status: AC
  Filled 2017-06-11: qty 250

## 2017-06-11 MED ORDER — SODIUM CHLORIDE 0.9 % IV SOLN
INTRAVENOUS | Status: AC | PRN
  Administered 2017-06-11: 1000 mL via INTRAVENOUS

## 2017-06-11 MED ORDER — SODIUM CHLORIDE 0.9 % IV BOLUS (SEPSIS)
250.0000 mL | Freq: Once | INTRAVENOUS | Status: AC
  Administered 2017-06-11: 250 mL via INTRAVENOUS

## 2017-06-11 MED ORDER — HYDRALAZINE HCL 20 MG/ML IJ SOLN: 5.0000 mg | INTRAMUSCULAR | Status: AC | PRN

## 2017-06-11 MED ORDER — SODIUM CHLORIDE 0.9 % IV SOLN: 4.0000 ug/kg/min | INTRAVENOUS | Status: AC

## 2017-06-11 MED ORDER — LABETALOL HCL 5 MG/ML IV SOLN: 10.0000 mg | INTRAVENOUS | Status: AC | PRN

## 2017-06-11 MED ORDER — DEXTROSE-NACL 5-0.45 % IV SOLN
INTRAVENOUS | Status: DC
  Administered 2017-06-11 – 2017-06-12 (×2): via INTRAVENOUS

## 2017-06-11 MED ORDER — CHLORHEXIDINE GLUCONATE 0.12% ORAL RINSE (MEDLINE KIT)
15.0000 mL | Freq: Two times a day (BID) | OROMUCOSAL | Status: DC
  Administered 2017-06-11 – 2017-06-13 (×4): 15 mL via OROMUCOSAL

## 2017-06-11 MED ORDER — HEPARIN (PORCINE) IN NACL 2-0.9 UNIT/ML-% IJ SOLN
INTRAMUSCULAR | Status: AC | PRN
  Administered 2017-06-11: 1000 mL via INTRA_ARTERIAL

## 2017-06-11 MED ORDER — CANGRELOR TETRASODIUM 50 MG IV SOLR
INTRAVENOUS | Status: AC | PRN
  Administered 2017-06-11: 4 ug/kg/min via INTRAVENOUS

## 2017-06-11 MED ORDER — DEXTROSE 50 % IV SOLN
50.0000 mL | Freq: Once | INTRAVENOUS | Status: AC
  Administered 2017-06-11: 50 mL via INTRAVENOUS

## 2017-06-11 MED ORDER — SODIUM CHLORIDE 0.9 % IV SOLN
1.0000 ug/kg/min | INTRAVENOUS | Status: DC
  Administered 2017-06-11: 1 ug/kg/min via INTRAVENOUS
  Filled 2017-06-11: qty 20

## 2017-06-11 MED ORDER — PHENYLEPHRINE HCL 10 MG/ML IJ SOLN
INTRAMUSCULAR | Status: DC | PRN
  Administered 2017-06-11 (×2): 120 ug via INTRAVENOUS
  Administered 2017-06-11 (×2): 80 ug via INTRAVENOUS

## 2017-06-11 MED ORDER — LIDOCAINE HCL 2 % IJ SOLN
INTRAMUSCULAR | Status: AC
  Filled 2017-06-11: qty 10

## 2017-06-11 MED ORDER — MIDAZOLAM HCL 2 MG/2ML IJ SOLN: 1.0000 mg | Freq: Once | INTRAMUSCULAR | Status: DC

## 2017-06-11 MED ORDER — CANGRELOR TETRASODIUM 50 MG IV SOLR
INTRAVENOUS | Status: AC
  Filled 2017-06-11: qty 50

## 2017-06-11 MED ORDER — SODIUM CHLORIDE 0.9 % IV SOLN
2.0000 mg/h | INTRAVENOUS | Status: DC
  Administered 2017-06-11: 2 mg/h via INTRAVENOUS
  Administered 2017-06-11: 1 mg/h via INTRAVENOUS
  Administered 2017-06-12: 4 mg/h via INTRAVENOUS
  Filled 2017-06-11 (×3): qty 10

## 2017-06-11 MED ORDER — BIVALIRUDIN TRIFLUOROACETATE 250 MG IV SOLR
INTRAVENOUS | Status: AC | PRN
  Administered 2017-06-11: 0.25 mg/kg/h via INTRAVENOUS

## 2017-06-11 MED ORDER — TICAGRELOR 90 MG PO TABS: 90.0000 mg | ORAL_TABLET | Freq: Two times a day (BID) | ORAL | Status: DC

## 2017-06-11 MED ORDER — ONDANSETRON HCL 4 MG/2ML IJ SOLN: 4.0000 mg | Freq: Four times a day (QID) | INTRAMUSCULAR | Status: DC | PRN

## 2017-06-11 MED ORDER — SODIUM CHLORIDE 0.9% FLUSH: 3.0000 mL | INTRAVENOUS | Status: DC | PRN

## 2017-06-11 MED ORDER — PROPOFOL 1000 MG/100ML IV EMUL: 0.0000 ug/kg/min | INTRAVENOUS | Status: DC

## 2017-06-11 MED ORDER — LIDOCAINE HCL (PF) 1 % IJ SOLN
INTRAMUSCULAR | Status: DC | PRN
  Administered 2017-06-11: 20 mL via INTRADERMAL

## 2017-06-11 MED ORDER — CHLORHEXIDINE GLUCONATE 0.12% ORAL RINSE (MEDLINE KIT)
15.0000 mL | Freq: Two times a day (BID) | OROMUCOSAL | Status: DC
  Administered 2017-06-11: 15 mL via OROMUCOSAL

## 2017-06-11 MED ORDER — MIDAZOLAM HCL 2 MG/2ML IJ SOLN: 1.0000 mg | INTRAMUSCULAR | Status: DC | PRN

## 2017-06-11 MED ORDER — SUCCINYLCHOLINE CHLORIDE 20 MG/ML IJ SOLN
INTRAMUSCULAR | Status: DC | PRN
  Administered 2017-06-11: 50 mg via INTRAVENOUS

## 2017-06-11 MED ORDER — NOREPINEPHRINE BITARTRATE 1 MG/ML IV SOLN
0.0000 ug/min | INTRAVENOUS | Status: DC
  Administered 2017-06-11: 14 ug/min via INTRAVENOUS
  Administered 2017-06-12: 22 ug/min via INTRAVENOUS
  Administered 2017-06-13: 40 ug/min via INTRAVENOUS
  Administered 2017-06-13: 37 ug/min via INTRAVENOUS
  Filled 2017-06-11 (×5): qty 16

## 2017-06-11 MED ORDER — DEXTROSE 50 % IV SOLN
INTRAVENOUS | Status: AC
  Administered 2017-06-11: 50 mL via INTRAVENOUS
  Filled 2017-06-11: qty 50

## 2017-06-11 MED ORDER — ACETAMINOPHEN 325 MG PO TABS: 650.0000 mg | ORAL_TABLET | ORAL | Status: DC | PRN

## 2017-06-11 MED ORDER — PROPOFOL 1000 MG/100ML IV EMUL
INTRAVENOUS | Status: AC
  Filled 2017-06-11: qty 100

## 2017-06-11 MED ORDER — DEXTROSE 5 % IV SOLN
0.0000 ug/min | INTRAVENOUS | Status: DC
  Filled 2017-06-11: qty 4

## 2017-06-11 MED ORDER — PANTOPRAZOLE SODIUM 40 MG IV SOLR
40.0000 mg | Freq: Every day | INTRAVENOUS | Status: DC
  Administered 2017-06-12 – 2017-06-13 (×2): 40 mg via INTRAVENOUS
  Filled 2017-06-11 (×3): qty 40

## 2017-06-11 MED ORDER — NITROGLYCERIN 1 MG/10 ML FOR IR/CATH LAB
INTRA_ARTERIAL | Status: AC
  Filled 2017-06-11: qty 10

## 2017-06-11 MED ORDER — IPRATROPIUM-ALBUTEROL 0.5-2.5 (3) MG/3ML IN SOLN
3.0000 mL | RESPIRATORY_TRACT | Status: DC | PRN
  Filled 2017-06-11: qty 3

## 2017-06-11 MED ORDER — EPHEDRINE SULFATE 50 MG/ML IJ SOLN
INTRAMUSCULAR | Status: DC | PRN
  Administered 2017-06-11: 10 mg via INTRAVENOUS
  Administered 2017-06-11 (×2): 20 mg via INTRAVENOUS
  Administered 2017-06-11: 30 mg via INTRAVENOUS

## 2017-06-11 MED ORDER — FENTANYL CITRATE (PF) 100 MCG/2ML IJ SOLN
50.0000 ug | INTRAMUSCULAR | Status: DC | PRN
  Administered 2017-06-11: 50 ug via INTRAVENOUS
  Filled 2017-06-11: qty 2

## 2017-06-11 MED ORDER — ASPIRIN 81 MG PO CHEW: 81.0000 mg | CHEWABLE_TABLET | Freq: Every day | ORAL | Status: DC

## 2017-06-11 MED ORDER — MIDAZOLAM BOLUS VIA INFUSION
1.0000 mg | INTRAVENOUS | Status: DC | PRN
  Filled 2017-06-11: qty 1

## 2017-06-11 MED ORDER — ORAL CARE MOUTH RINSE: 15.0000 mL | Freq: Four times a day (QID) | OROMUCOSAL | Status: DC

## 2017-06-11 MED ORDER — SODIUM CHLORIDE 0.9 % IV SOLN
0.2500 mg/kg/h | INTRAVENOUS | Status: AC
  Administered 2017-06-11: 0.25 mg/kg/h via INTRAVENOUS
  Filled 2017-06-11: qty 250

## 2017-06-11 MED ORDER — ORAL CARE MOUTH RINSE
15.0000 mL | OROMUCOSAL | Status: DC
  Administered 2017-06-11 – 2017-06-13 (×18): 15 mL via OROMUCOSAL

## 2017-06-11 MED ORDER — SODIUM CHLORIDE 0.9 % IV SOLN: 250.0000 mL | INTRAVENOUS | Status: DC | PRN

## 2017-06-11 MED ORDER — SODIUM CHLORIDE 0.9% FLUSH
3.0000 mL | Freq: Two times a day (BID) | INTRAVENOUS | Status: DC
  Administered 2017-06-11 – 2017-06-12 (×3): 10 mL via INTRAVENOUS
  Administered 2017-06-13: 3 mL via INTRAVENOUS

## 2017-06-11 MED ORDER — TICAGRELOR 90 MG PO TABS
180.0000 mg | ORAL_TABLET | Freq: Once | ORAL | Status: AC
  Administered 2017-06-11: 180 mg
  Filled 2017-06-11: qty 2

## 2017-06-11 MED ORDER — IOPAMIDOL (ISOVUE-370) INJECTION 76%
INTRAVENOUS | Status: DC | PRN
  Administered 2017-06-11: 100 mL via INTRA_ARTERIAL

## 2017-06-11 MED ORDER — NOREPINEPHRINE BITARTRATE 1 MG/ML IV SOLN: 0.0000 ug/min | INTRAVENOUS | Status: DC

## 2017-06-11 MED ORDER — SODIUM CHLORIDE 0.9 % IV BOLUS (SEPSIS): 2000.0000 mL | Freq: Once | INTRAVENOUS | Status: DC

## 2017-06-11 MED ORDER — ARTIFICIAL TEARS OPHTHALMIC OINT
1.0000 "application " | TOPICAL_OINTMENT | Freq: Three times a day (TID) | OPHTHALMIC | Status: DC
  Administered 2017-06-11 – 2017-06-13 (×5): 1 via OPHTHALMIC
  Filled 2017-06-11: qty 3.5

## 2017-06-11 MED ORDER — PROPOFOL 10 MG/ML IV BOLUS
INTRAVENOUS | Status: DC | PRN
  Administered 2017-06-11: 5 mg via INTRAVENOUS

## 2017-06-11 MED ORDER — FENTANYL 2500MCG IN NS 250ML (10MCG/ML) PREMIX INFUSION
100.0000 ug/h | INTRAVENOUS | Status: DC
  Administered 2017-06-11: 100 ug/h via INTRAVENOUS
  Administered 2017-06-12: 150 ug/h via INTRAVENOUS
  Administered 2017-06-12: 200 ug/h via INTRAVENOUS
  Filled 2017-06-11 (×3): qty 250

## 2017-06-11 MED ORDER — HEPARIN (PORCINE) IN NACL 2-0.9 UNIT/ML-% IJ SOLN
INTRAMUSCULAR | Status: AC
  Filled 2017-06-11: qty 1000

## 2017-06-11 MED ORDER — IOPAMIDOL (ISOVUE-370) INJECTION 76%
INTRAVENOUS | Status: AC
  Filled 2017-06-11: qty 125

## 2017-06-11 MED ORDER — CISATRACURIUM BOLUS VIA INFUSION
0.1000 mg/kg | Freq: Once | INTRAVENOUS | Status: AC
  Administered 2017-06-11: 5.6 mg via INTRAVENOUS
  Filled 2017-06-11: qty 6

## 2017-06-11 MED ORDER — TICAGRELOR 90 MG PO TABS
90.0000 mg | ORAL_TABLET | Freq: Two times a day (BID) | ORAL | Status: DC
  Administered 2017-06-12 – 2017-06-13 (×3): 90 mg
  Filled 2017-06-11 (×3): qty 1

## 2017-06-11 MED ORDER — CHLORHEXIDINE GLUCONATE CLOTH 2 % EX PADS
6.0000 | MEDICATED_PAD | Freq: Every day | CUTANEOUS | Status: DC
  Administered 2017-06-11: 6 via TOPICAL

## 2017-06-11 MED ORDER — BIVALIRUDIN BOLUS VIA INFUSION - CUPID
INTRAVENOUS | Status: DC | PRN
  Administered 2017-06-11: 42.075 mg via INTRAVENOUS

## 2017-06-11 MED ORDER — FENTANYL CITRATE (PF) 100 MCG/2ML IJ SOLN
50.0000 ug | INTRAMUSCULAR | Status: DC | PRN
  Administered 2017-06-11: 50 ug via INTRAVENOUS

## 2017-06-11 MED ORDER — IOPAMIDOL (ISOVUE-370) INJECTION 76%
INTRAVENOUS | Status: AC
  Filled 2017-06-11: qty 100

## 2017-06-11 MED ORDER — NITROGLYCERIN 1 MG/10 ML FOR IR/CATH LAB
INTRA_ARTERIAL | Status: DC | PRN
  Administered 2017-06-11 (×2): 200 ug via INTRACORONARY

## 2017-06-11 MED ORDER — FENTANYL CITRATE (PF) 100 MCG/2ML IJ SOLN: 50.0000 ug | Freq: Once | INTRAMUSCULAR | Status: DC

## 2017-06-11 MED ORDER — DEXTROSE 50 % IV SOLN
50.0000 mL | Freq: Once | INTRAVENOUS | Status: AC
  Administered 2017-06-11: 50 mL via INTRAVENOUS
  Filled 2017-06-11: qty 50

## 2017-06-11 MED ORDER — CISATRACURIUM BOLUS VIA INFUSION
0.0500 mg/kg | INTRAVENOUS | Status: DC | PRN
  Filled 2017-06-11: qty 3

## 2017-06-11 MED ORDER — ATROPINE SULFATE 0.4 MG/ML IJ SOLN
INTRAMUSCULAR | Status: DC | PRN
  Administered 2017-06-11: .2 mg via INTRAVENOUS

## 2017-06-11 MED ORDER — CANGRELOR BOLUS VIA INFUSION
INTRAVENOUS | Status: DC | PRN
  Administered 2017-06-11: 1683 ug via INTRAVENOUS

## 2017-06-11 MED ORDER — FENTANYL BOLUS VIA INFUSION
25.0000 ug | INTRAVENOUS | Status: DC | PRN
  Filled 2017-06-11: qty 25

## 2017-06-11 MED ORDER — SODIUM CHLORIDE 0.9 % IV SOLN
INTRAVENOUS | Status: AC
  Administered 2017-06-11: 17:00:00 via INTRAVENOUS

## 2017-06-11 SURGICAL SUPPLY — 17 items
BALLN EUPHORA RX 2.0X12 (BALLOONS) ×2
BALLN SAPPHIRE ~~LOC~~ 2.5X8 (BALLOONS) ×1 IMPLANT
BALLOON EUPHORA RX 2.0X12 (BALLOONS) IMPLANT
CATH INFINITI 5FR MULTPACK ANG (CATHETERS) ×1 IMPLANT
CATH VISTA GUIDE 6FR JR4 SH (CATHETERS) ×1 IMPLANT
HOVERMATT SINGLE USE (MISCELLANEOUS) ×1 IMPLANT
KIT ENCORE 26 ADVANTAGE (KITS) ×1 IMPLANT
KIT HEART LEFT (KITS) ×2 IMPLANT
PACK CARDIAC CATHETERIZATION (CUSTOM PROCEDURE TRAY) ×2 IMPLANT
SHEATH PINNACLE 5F 10CM (SHEATH) ×1 IMPLANT
SHEATH PINNACLE 6F 10CM (SHEATH) ×1 IMPLANT
STENT SYNERGY DES 2.25X12 (Permanent Stent) ×1 IMPLANT
TRANSDUCER W/STOPCOCK (MISCELLANEOUS) ×2 IMPLANT
TUBING CIL FLEX 10 FLL-RA (TUBING) ×2 IMPLANT
WIRE ASAHI PROWATER 180CM (WIRE) ×1 IMPLANT
WIRE EMERALD 3MM-J .035X150CM (WIRE) ×1 IMPLANT
WIRE HI TORQ VERSACORE-J 145CM (WIRE) ×1 IMPLANT

## 2017-06-11 NOTE — Code Documentation (Signed)
CODE BLUE NOTE  Patient Name: Gerald Davenport   MRN: [id number]   Date of Birth/ Sex: 1941/07/27 , male      Admission Date: 06/15/2017  Attending Provider: Tawni Millers,*  Primary Diagnosis: Chest pain    Indication: Pt was in his usual state of health until this AM, when he was working with PT, passed out per nurse and noted to be unresponsive.  She could not feel a pulse. Code blue was subsequently called. At the time of arrival on scene, ACLS protocol was underway. Chest compressions began at 1116.    Technical Description:  - CPR performance duration:  4 minutes  - Was defibrillation or cardioversion used? No   - Was external pacer placed? No  - Was patient intubated pre/post CPR? Yes    Medications Administered: Y = Yes; Blank = No Amiodarone    Atropine    Calcium    Epinephrine  Y  Lidocaine    Magnesium    Norepinephrine    Phenylephrine  Y  Sodium bicarbonate    Vasopressin      Post CPR evaluation:  - Final Status - Was patient successfully resuscitated ? Yes   Miscellaneous Information:  - Labs sent, including: Troponin, CBC, renal function, Mg  - Primary team notified?  Yes  - Family Notified? Yes  - Additional notes/ transfer status: N/A        Rory Percy, DO  06/04/2017, 12:41 PM

## 2017-06-11 NOTE — Progress Notes (Signed)
Physical Therapy Treatment Patient Details Name: Gerald Davenport MRN: [id number] DOB: Jan 13, 1941 Today's Date: 06/16/2017    History of Present Illness 76 y.o. male with a hx of HTN, HLD, DM and ESRD on HD and was seen by Dr. Radford Pax in 2015 for SOB with Dialysis, his nuc was normal and Echo with normal EF and mildly leaky AV.  He is RBKA for PAD and ulcer.     PT Comments    Patient unable to tolerate sitting EOB this session due to decreased responsiveness and syncopal symptoms.  Did recover back in supine.  RN made aware.  Currently not appropriate for d/c as medically unstable and unable to participate in EOB/OOB activity.  Will follow up as stabilizes and able to participate to further determine most appropriate d/c disposition.    Follow Up Recommendations  Other (comment) (depending on progress/medical issues)     Equipment Recommendations  Other (comment) (TBA at next venue)    Recommendations for Other Services       Precautions / Restrictions Precautions Precautions: Fall Precaution Comments: watch BP, orthostatic    Mobility  Bed Mobility Overal bed mobility: Needs Assistance Bed Mobility: Rolling;Sidelying to Sit;Sit to Supine Rolling: Min assist Sidelying to sit: Mod assist Supine to sit: Max assist     General bed mobility comments: cues and assist to roll using railing, assist for legs off bed and to lift trunk, pt very unsteady and falling back, assist for balance, but pt with decreased verbalizations so returned to supine; then took BP supine, 123/81 HR 92, attempted BP in supported sitting, but pt again with decreased verbalizations and pre-syncopal symptoms so returned to supine and improved responsiveness in few seconds, scooted up in bed and checked BP with HOB at 50 degrees, 103/57, pt continued to c/o abdominal pain so lowered HOB slightly for comfort  Transfers                    Ambulation/Gait                 Stairs             Wheelchair Mobility    Modified Rankin (Stroke Patients Only)       Balance Overall balance assessment: Needs assistance   Sitting balance-Leahy Scale: Zero Sitting balance - Comments: leaning back in sitting, somewhat due to decreased responsiveness in sitting                                    Cognition Arousal/Alertness: Awake/alert (sleepy) Behavior During Therapy: WFL for tasks assessed/performed Overall Cognitive Status: No family/caregiver present to determine baseline cognitive functioning                                 General Comments: Difficult to understand      Exercises      General Comments General comments (skin integrity, edema, etc.): Son arrived during session as well as CIR admissions coordinator who assisted to assess pt in sitting and to scoot pt up in bed; RN made aware pt with inability to tolerate sitting and with low BP, nurse tech in to check CBG 197      Pertinent Vitals/Pain Faces Pain Scale: Hurts little more Pain Location: Pt gestured towards lower abdomen, suprapubic area Pain Descriptors / Indicators: Grimacing;Discomfort Pain Intervention(s): Monitored during session;Repositioned  Home Living                      Prior Function            PT Goals (current goals can now be found in the care plan section) Progress towards PT goals: Not progressing toward goals - comment (due to medical issues)    Frequency    Min 3X/week      PT Plan Discharge plan needs to be updated    Co-evaluation              AM-PAC PT "6 Clicks" Daily Activity  Outcome Measure  Difficulty turning over in bed (including adjusting bedclothes, sheets and blankets)?: Unable Difficulty moving from lying on back to sitting on the side of the bed? : Unable Difficulty sitting down on and standing up from a chair with arms (e.g., wheelchair, bedside commode, etc,.)?: Unable Help needed moving to and from a  bed to chair (including a wheelchair)?: Total Help needed walking in hospital room?: Total Help needed climbing 3-5 steps with a railing? : Total 6 Click Score: 6    End of Session   Activity Tolerance: Treatment limited secondary to medical complications (Comment) Patient left: in bed;with call bell/phone within reach;with family/visitor present Nurse Communication: Other (comment) (unable to tolerate sitting; question due to orthostatic hypotension) PT Visit Diagnosis: Unsteadiness on feet (R26.81);Other abnormalities of gait and mobility (R26.89);Muscle weakness (generalized) (M62.81)     Time: 6979-4801 PT Time Calculation (min) (ACUTE ONLY): 15 min  Charges:  $Therapeutic Activity: 8-22 mins                    G CodesMagda Kiel, [place] [phone] 06/10/2017    Reginia Naas 06/23/2017, 12:02 PM

## 2017-06-11 NOTE — Procedures (Signed)
Date of recording 06/20/2017  Referring [place]  Technical Digital EEG recording using 10-20 international electrode system  Reason for the study 76 year old male with cardiac arrest unresponsive currently on ventilatory support  Description of the recording Posterior background is 5-6 Hz  EEG comprises of low amplitude theta and some intermittent delta slowing with minimum reactivity Sleep architecture was not observed Epileptiform features were not seen during this recording.  Impression  This EEG is abnormal and findings are consistent with nonspecific generalized cerebral dysfunction, epileptiform features were not seen during this recording.

## 2017-06-11 NOTE — Progress Notes (Signed)
CRITICAL VALUE ALERT  Critical Value:  Lactic 2.1   INR 5.37   PTT 162   Date & Time Notied: 03/11/17 1550  Provider Notified: Dr. Lynford Citizen  Orders Received/Actions taken: CT of the head ordered.

## 2017-06-11 NOTE — Procedures (Signed)
Central Venous Catheter Insertion Procedure Note Gerald Davenport [id number] November 05, 1940  Procedure: Insertion of Central Venous Catheter Indications: Assessment of intravascular volume, Drug and/or fluid administration and Frequent blood sampling  Procedure Details Consent: Risks of procedure as well as the alternatives and risks of each were explained to the (patient/caregiver).  Consent for procedure obtained. Time Out: Verified patient identification, verified procedure, site/side was marked, verified correct patient position, special equipment/implants available, medications/allergies/relevent history reviewed, required imaging and test results available.  Performed  Maximum sterile technique was used including antiseptics, cap, gloves, gown, hand hygiene, mask and sheet. Skin prep: Chlorhexidine; local anesthetic administered A antimicrobial bonded/coated triple lumen catheter was placed in the left internal jugular vein using the Seldinger technique.  Evaluation Blood flow good Complications: No apparent complications Patient did tolerate procedure well. Chest X-ray ordered to verify placement.  CXR: pending.  Gerald Davenport 06/20/2017, 1:22 PM  Line needs confirmation , in cath lab told to RN for hand off [place]  Gerald Davenport J. Titus Mould, MD, [place] Pgr: [facility]

## 2017-06-11 NOTE — Progress Notes (Signed)
PROGRESS NOTE    Gerald Davenport  VQM:[id number] DOB: 07-18-1941 DOA: 06/28/2017 PCP: Alroy Dust, L.Marlou Sa, MD    Brief Narrative:  76 yo male who presented for chest pain. Patient is known to have end-stage renal disease on hemodialysis, type 2 diabetes mellitus, hypertension, dyslipidemia, and peripheral vascular disease with right BKA. He developed severe substernal chest pain, without radiation, improving with sublingual nitroglycerin, by thetime he reached the emergency department he was chest pain-free. On his initial physical examination blood pressure 204/94, heart rate 105, temperature 97.8, respiratory rate 20, oxygen saturation 99%. Moist mucous membranes, heart S1-S2 present and rhythmic, no gallop, rubs or murmurs, lungs were clear to auscultation bilaterally, no wheezing, rales or rhonchi, abdomen was soft, nontender, nondistended, no lowerextremity edema. Sodium 137, potassium 4.5, chloride 91, glucose 21, BUN 51, creatinine 11.5, troponin 0.09, white count 13.9, hemoglobin 15.4, hematocrit 45.9, platelets 235, chest x-ray with right internal jougular hemodialysis catheter in place, no effusions, infiltrates or signs of pneumothorax. EKG sinus rhythm, rate 111 bpm, positive ST depressions in the inferior lateral leads, more pronounced thatbaseline.  Patient admitted to the hospital with the working diagnosis of chest pain, to rule out non-ST elevation myocardial infarction. Patient rule out for acute current syndrome, he was being evaluated for inpatient rehabilitation admission. On September 13 he developed episodes of hypotension and syncope. He suffered from a cardiac arrest, received brief CPR, with recovery of spontaneous circulation. He remained encephalopathic, intubated for protection.    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 Cardiac arrest, with a post arrest encephalopathy. Patient recovered spontaneous circulation after brief course of CPR. He did develop bradycardia down to the 40s before intubation, sinus bradycardia, improved with atropine. He received propofol to facilitate endotracheal intubation, subsequently developed hypotension, spotted to vasopressor boluses. Ephedrine and pseudoephedrine. Blood pressure recovered, IV fluids being given. Unclear cause of cardiac arrest, possible hypotension or electrolyte abnormalities. Note patient hyperkalemic before dialysis yesterday. Follow up on chemistry post cardiac arrest. Patient received calcium intravenously. I have spoke with Dr. Vaughan Browner, patient will be transferred to the intensive care unit.  EKG post cardiac arrest with ST depressions inferior leads, and lateralT-wave inversions. 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 For now continue medical therapy with antiplatelet therapy, statin. Echocardiography with no wall motionabnormalities abnormalities.  Continue carvedilol, stop hydralazine and isosorbide. Note that patient was on clonidine on admission.  2. End-stage renal disease on hemodialysis. Patient clinically euvolemic, check chemistry post cardiac arrest, follow-up with further recommendations from nephrology. Dialysis scheduled for tomorrow September 14  3. Type 2 diabetes mellitus. Continue insulin sliding-scale, capillary glucose remained well-controlled  Patient critically ill, will be placed on noninvasive mechanical ventilation, to prevent deterioration.   Critical care time 60 minutes  DVT prophylaxis: heparin  Code Status: Full Family Communication: I spoke with patient's son at the bedside and all questions  were addresed Disposition Plan: ICU   Consultants:   Critical  Care  Cardiology  Procedures:   Endotracheal intubation 09/13  Antimicrobials:       Subjective: Patient had HD yesterday,  noted to be very weak and deconditioned per physical therapy, with recommendations for inpatient rehab consultation. Patient with no chest pain, dyspnea, no nausea or vomiting.   Patient developed cardiac arrest, now unresponsive.   Objective: Vitals:   06/10/17 2016 06/10/17 2245 06/10/17 2300 06/20/2017 0422  BP: 131/84 134/73 113/61 121/84  Pulse: 90  91 93  Resp: (!) 22  (!) 21 17  Temp: 97.6 F (36.4 C)  97.8 F (36.6 C) 97.6 F (36.4 C)  TempSrc: Oral  Oral Oral  SpO2: 100%  99% 100%  Weight:    56.1 kg (123 lb 10.9 oz)  Height:        Intake/Output Summary (Last 24 hours) at 06/08/2017 0759 Last data filed at 05/30/2017 0430  Gross per 24 hour  Intake              360 ml  Output              580 ml  Net             -220 ml   Filed Weights   06/10/17 1550 06/10/17 1900 06/26/2017 0422  Weight: 57.1 kg (125 lb 14.1 oz) 58.7 kg (129 lb 6.6 oz) 56.1 kg (123 lb 10.9 oz)    Examination:  General: deconditioned and ill looking appearing, not responsive.  Neurology: Not responsive E ENT: positive pallor, no icterus, oral mucosa moist Cardiovascular: S1-S2 present, rhythmic, bradycardic no gallops, rubs, or murmurs. No jugular venous distention, no lower extremity edema. Pulmonary:  breath sounds bilaterally at bases, adequate air movement, no wheezing, rhonchi or rales. Gastrointestinal. Abdomen flat, no organomegaly, non tender, no rebound or guarding Skin. No rashes Musculoskeletal: no joint deformities/ right BKA.      Data Reviewed: I have personally reviewed following labs and imaging studies  CBC:  Recent Labs Lab 06/24/2017 1014 05/31/2017 1047 06/09/17 1442 06/10/17 0342  WBC 13.9*  --  13.6* 13.2*  NEUTROABS 12.2*  --  11.1* 10.6*  HGB 15.4 18.4* 15.0 15.9    HCT 45.9 54.0* 44.1 46.9  MCV 106.7*  --  106.5* 104.2*  PLT 235  --  197 973   Basic Metabolic Panel:  Recent Labs Lab 06/25/2017 1014 06/11/2017 1047 06/08/17 1407 06/09/17 0430 06/09/17 1442 06/10/17 0342  NA 139 137 138 133* 132* 130*  K 4.6 4.5 4.7 4.6 5.3* 5.7*  CL 88* 91* 86* 92* 91* 90*  CO2 27  --  31 24 20* 19*  GLUCOSE 282* 281* 152* 63* 140* 110*  BUN 44* 51* 69* 38* 50* 65*  CREATININE 11.71* 11.50* 14.18* 8.49* 9.97* 11.03*  CALCIUM 9.8  --  9.1 8.3* 8.3* 8.4*  PHOS  --   --  8.0* 5.9*  --  8.8*   GFR: Estimated Creatinine Clearance: 4.4 mL/min (A) (by C-G formula based on SCr of 11.03 mg/dL (H)). Liver Function Tests:  Recent Labs Lab 06/27/2017 1014 06/08/17 1407 06/09/17 1442 06/10/17 0342  AST 23  --  25  --   ALT 12*  --  13*  --   ALKPHOS 56  --  56  --   BILITOT 0.9  --  QUANTITY NOT SUFFICIENT, UNABLE TO PERFORM TEST  --   PROT 9.3*  --  8.8*  --   ALBUMIN 4.2 4.0 4.0 3.9    Recent Labs Lab 05/30/2017 1014  LIPASE 47   No results for input(s): AMMONIA in the last 168 hours. Coagulation Profile: No results for input(s): INR,  PROTIME in the last 168 hours. Cardiac Enzymes:  Recent Labs Lab 06/09/2017 1846 06/06/2017 2136 06/08/17 0017  TROPONINI 0.09* 0.08* 0.08*   BNP (last 3 results) No results for input(s): PROBNP in the last 8760 hours. HbA1C: No results for input(s): HGBA1C in the last 72 hours. CBG:  Recent Labs Lab 06/09/17 2043 06/10/17 0952 06/10/17 1138 06/10/17 2044 06/20/2017 0726  GLUCAP 133* 111* 240* 196* 219*   Lipid Profile:  Recent Labs  06/09/17 0430  CHOL 171  HDL 72  LDLCALC 64  TRIG 174*  CHOLHDL 2.4   Thyroid Function Tests: No results for input(s): TSH, T4TOTAL, FREET4, T3FREE, THYROIDAB in the last 72 hours. Anemia Panel: No results for input(s): VITAMINB12, FOLATE, FERRITIN, TIBC, IRON, RETICCTPCT in the last 72 hours.    Radiology Studies: I have reviewed all of the imaging during this  hospital visit personally     Scheduled Meds: . aspirin EC  81 mg Oral Daily  . buPROPion  150 mg Oral Daily  . calcitRIOL  0.75 mcg Oral Q M,W,F-HD  . calcium acetate  667-2,001 mg Oral TID WC  . carvedilol  6.25 mg Oral BID WC  . cinacalcet  30 mg Oral Q breakfast  . clopidogrel  75 mg Oral Daily  . docusate sodium  100 mg Oral BID  . doxazosin  8 mg Oral Daily  . feeding supplement (NEPRO CARB STEADY)  237 mL Oral BID BM  . fluorometholone  1 drop Both Eyes QHS  . gabapentin  100 mg Oral BID  . heparin  5,000 Units Subcutaneous Q8H  . hydrALAZINE  25 mg Oral BID  . insulin aspart  0-5 Units Subcutaneous QHS  . insulin aspart  0-9 Units Subcutaneous TID WC  . insulin detemir  10 Units Subcutaneous QHS  . isosorbide mononitrate  30 mg Oral Daily  . linaclotide  145 mcg Oral QAC breakfast  . multivitamin  1 tablet Oral QHS  . pantoprazole  40 mg Oral Daily  . polyvinyl alcohol  1 drop Both Eyes QHS  . QUEtiapine  200 mg Oral QHS  . sevelamer carbonate  800 mg Oral TID WC  . simvastatin  20 mg Oral QHS   Continuous Infusions: . ferric gluconate (FERRLECIT/NULECIT) IV 62.5 mg (06/10/17 1718)     LOS: 4 days        Lavra Imler Gerome Apley, MD [facility] Pager [phone]

## 2017-06-11 NOTE — Care Management Important Message (Signed)
Important Message  Patient Details  Name: Gerald Davenport MRN: [id number] Date of Birth: [birth year]   Medicare Important Message Given:  Yes    [name] May 06/17/2017, 9:23 AM

## 2017-06-11 NOTE — Progress Notes (Signed)
CVL - ok to use - image visualized   Dr. Brand Males, M.D., [facility].C.P Pulmonary and Critical Care Medicine Staff Physician [place] [facility] Pager: [phone], If no answer or between  15:00h - 7:00h: call [phone]  06/02/2017 4:37 PM      Dg Chest Port 1 View  Result Date: 06/07/2017 CLINICAL DATA:  Acute respiratory failure. EXAM: PORTABLE CHEST 1 VIEW COMPARISON:  June 07, 2017. FINDINGS: The cerclage are pad over the central chest. Interval insertion of an endotracheal tube with the tip in good position approximately 3.8 cm above the carina. An enteric tube is noted entering the stomach, with the distal side port just beyond the gastroesophageal junction. New left internal jugular central venous catheter with the tip projecting over the mid SVC. Unchanged tunneled right internal jugular dialysis catheter with the tip at the cavoatrial junction. The cardiomediastinal silhouette is normal in size. Normal pulmonary vascularity. No focal consolidation, pleural effusion, or pneumothorax. No acute osseous abnormality. Unchanged right axillary stent. IMPRESSION: 1. Interval placement of endotracheal and enteric tubes, as well as a left internal jugular central venous catheter, which are in appropriate position. 2. No active cardiopulmonary disease.  No pneumothorax. Electronically Signed   By: Titus Dubin M.D.   On: 06/12/2017 16:07

## 2017-06-11 NOTE — Progress Notes (Signed)
Hypoglycemic Event  CBG: 59  Treatment: D50 IV 50 mL  Symptoms: None  Follow-up CBG: Time: 2235 CBG Result: 217  Possible Reasons for Event: Inadequate meal intake  Comments/MD notified: Elink, MD verbal order to start D5 1/2NS at 54ml/hr     Angelica Pou

## 2017-06-11 NOTE — Progress Notes (Signed)
Bedside EEG completed, results pending. 

## 2017-06-11 NOTE — Progress Notes (Signed)
   CT head without bleed - continue to monitr   Dr. Brand Males, M.D., [facility].C.P Pulmonary and Critical Care Medicine Staff Physician [place] [facility] Pager: [phone], If no answer or between  15:00h - 7:00h: call [phone]  06/02/2017 8:54 PM

## 2017-06-11 NOTE — Progress Notes (Signed)
    Recent Labs Lab 06/25/2017 1303 06/23/2017 1542  INR 1.49 5.37*     No active bleed per RN Post arrest - so will get CT head  Dr. Brand Males, M.D., [facility].C.P Pulmonary and Critical Care Medicine Staff Physician [place] [facility] Pager: [phone], If no answer or between  15:00h - 7:00h: call [phone]  06/25/2017 5:14 PM

## 2017-06-11 NOTE — Telephone Encounter (Signed)
Received incoming fax from [facility] stating that the patient had to cancel his appointment on 06/20/2017 he is at the hospital at this time. They stated that they will keep his referral on file for 3 months so that he can reschedule. awt

## 2017-06-11 NOTE — Procedures (Signed)
Arterial Catheter Insertion Procedure Note DJ SENTENO [id number] 1941-06-06  Procedure: Insertion of Arterial Catheter  Indications: Blood pressure monitoring, Frequent blood sampling and coding  Procedure Details Consent: Unable to obtain consent because of emergent medical necessity. Time Out: Verified patient identification, verified procedure, site/side was marked, verified correct patient position, special equipment/implants available, medications/allergies/relevent history reviewed, required imaging and test results available.  Performed  Maximum sterile technique was used including antiseptics, cap, gloves, gown, hand hygiene, mask and sheet. Skin prep: Chlorhexidine; local anesthetic administered 20 gauge catheter was inserted into left femoral artery using the Seldinger technique.  Evaluation Blood flow good; BP tracing good. Complications: No apparent complications.   [name] 06/26/2017  [place] Titus Mould, MD, [place] Pgr: [place] [facility]

## 2017-06-11 NOTE — Progress Notes (Signed)
Subjective: Interval History: has complaints wants to go to rehab, but tomorrow.  Objective: Vital signs in last 24 hours: Temp:  [97.5 F (36.4 C)-98.2 F (36.8 C)] 97.9 F (36.6 C) (09/13 0802) Pulse Rate:  [87-100] 97 (09/13 0802) Resp:  [11-22] 15 (09/13 0802) BP: (113-158)/(52-91) 126/80 (09/13 0802) SpO2:  [93 %-100 %] 100 % (09/13 0802) Weight:  [56.1 kg (123 lb 10.9 oz)-58.7 kg (129 lb 6.6 oz)] 56.1 kg (123 lb 10.9 oz) (09/13 0422) Weight change: -0.7 kg (-1 lb 8.7 oz)  Intake/Output from previous day: 09/12 0701 - 09/13 0700 In: 360 [P.O.:360] Out: 580  Intake/Output this shift: No intake/output data recorded.  General appearance: alert, cooperative and no distress Resp: diminished breath sounds bilaterally Cardio: S1, S2 normal and systolic murmur: systolic ejection 2/6, crescendo and decrescendo at 2nd left intercostal space GI: soft, non-tender; bowel sounds normal; no masses,  no organomegaly Extremities: R BKA, retraction on stump and thickened area R IJ PC Lab Results:  Recent Labs  06/09/17 1442 06/10/17 0342  WBC 13.6* 13.2*  HGB 15.0 15.9  HCT 44.1 46.9  PLT 197 202   BMET:  Recent Labs  06/09/17 1442 06/10/17 0342  NA 132* 130*  K 5.3* 5.7*  CL 91* 90*  CO2 20* 19*  GLUCOSE 140* 110*  BUN 50* 65*  CREATININE 9.97* 11.03*  CALCIUM 8.3* 8.4*   No results for input(s): PTH in the last 72 hours. Iron Studies: No results for input(s): IRON, TIBC, TRANSFERRIN, FERRITIN in the last 72 hours.  Studies/Results: Dg Abd 1 View  Result Date: 06/09/2017 CLINICAL DATA:  Multi enters four-day loose, history of constipation EXAM: ABDOMEN - 1 VIEW COMPARISON:  None. FINDINGS: As a of the abdomen shows no obstruction. Gas is seen to the rectum. Only a mild to moderate amount of feces is noted throughout the colon. No opaque calculi noted. No acute bony abnormality is seen. IMPRESSION: No bowel obstruction. Only mild to moderate amount of feces is present  throughout the colon. Electronically Signed   By: Ivar Drape M.D.   On: 06/09/2017 13:27    I have reviewed the patient's current medications.  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

## 2017-06-11 NOTE — Consult Note (Signed)
PULMONARY / CRITICAL CARE MEDICINE   Name: Gerald Davenport MRN: [id number] DOB: 1941-03-11    ADMISSION DATE:  06/27/2017 CONSULTATION DATE:  06/19/2017  REFERRING MD:  Dr. 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   Nephrology and Cardiology consulted and following in hospitalization.  He had abnormal EKG, with ST depression on inferior-lateral leads, and mildly elevated troponin's thought related to demand ischemia in the setting of HTN urgency as well as ESRD.  He was being medically managed.  TTE on 9/11 was a technically difficult study but showed a normal EF at 55-60%, moderate LVH, and mild diastolic dysfunction.  Last dialyzed 1/44 without complications.   On 9/13, patient was in his normal state of health and being evaluated to be discharged to inpatient rehab when he had syncopal episode with BP of 98/71.  Patient then became unresponsive, found to be bradycardic and then lost pulse.  PEA arrest for 4 mins prior to ROSC with epi given.  EKG with diffuse ST depression.  Patient remained unresponsive and was intubated for airway protection by anesthesia and moved to the ICU.  PCCM to admit.  Cardiology notified of acute changes and concern for EKG changes.  On arrival to ICU, patient suffered second PEA arrest, ROSC after 4 mins.  Treated with ACLS and presumed hyperkalemia.    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  PAST SURGICAL HISTORY: He  has a past surgical history that includes Below knee leg amputation (2004); AV fistula placement (11/04/2010); AV fistula placement (11/20/2011); Colonoscopy (N/A, 11/10/2012); Colonoscopy (N/A, 11/28/2013); shuntogram (N/A, 10/08/2012); AV fistula placement (Right, 04/19/2015); Bascilic vein transposition (Left, 06/19/2015); AV fistula placement (Left, 10/04/2015); Arteriovenous goretex graft removal (Left, 10/13/2015); Cardiac catheterization (Bilateral, 12/06/2015); Vascular access device insertion (Right, 01/01/2016); Removal of a hero device (Right, 01/19/2016); Insertion of dialysis catheter (Right, 01/19/2016); Cardiac catheterization (N/A, 05/29/2016); Cardiac catheterization (N/A, 05/29/2016); Cardiac catheterization (05/29/2016); ABDOMINAL AORTOGRAM W/LOWER EXTREMITY (N/A, 02/12/2017); and PERIPHERAL VASCULAR BALLOON ANGIOPLASTY (Left, 02/12/2017).  No Known Allergies  No current facility-administered medications on file prior to encounter.    Current Outpatient Prescriptions on File Prior to Encounter  Medication Sig  . buPROPion (WELLBUTRIN XL) 150 MG 24 hr tablet Take 150 mg by mouth daily.  . calcium acetate (PHOSLO) 667 MG capsule Take 667-2,001 mg by mouth See admin instructions. Take 3 capsules (2001 mg) by mouth three times daily with meals and 1 capsule (667 mg) with snacks  . carboxymethylcellulose (REFRESH PLUS) 0.5 % SOLN Place 1 drop into both eyes 3 (three) times daily as needed (dry eyes/ itching).   . cinacalcet (SENSIPAR) 30 MG tablet Take 30 mg by mouth daily with breakfast.   . cloNIDine (CATAPRES) 0.2 MG tablet Take 1 tablet (0.2 mg total) by mouth 3 (three) times daily.  . clopidogrel (PLAVIX) 75 MG tablet Take 1 tablet (75 mg total) by mouth daily.  Marland Kitchen doxazosin (CARDURA) 8 MG tablet Take 8 mg by  mouth daily.   . fluorometholone (FML) 0.1 % ophthalmic suspension Place 1 drop into both eyes at bedtime.  .  furosemide (LASIX) 40 MG tablet Take 40 mg by mouth 2 (two) times daily.   Marland Kitchen gabapentin (NEURONTIN) 100 MG capsule Take 100 mg by mouth 2 (two) times daily.  . Insulin Detemir (LEVEMIR FLEXTOUCH) 100 UNIT/ML Pen Inject 10 Units into the skin daily before breakfast.   . Linaclotide (LINZESS) 145 MCG CAPS capsule Take 1 capsule (145 mcg total) by mouth daily. Take 30 minutes before breakfast (Patient taking differently: Take 145 mcg by mouth daily before breakfast. Take 30 minutes before breakfast)  . omeprazole (PRILOSEC) 20 MG capsule Take 20 mg by mouth daily.   . saxagliptin HCl (ONGLYZA) 5 MG TABS tablet Take 5 mg by mouth daily.   . sevelamer carbonate (RENVELA) 800 MG tablet Take 1,600 mg by mouth 3 (three) times daily with meals.   . simvastatin (ZOCOR) 20 MG tablet Take 20 mg by mouth at bedtime.   . Nutritional Supplements (FEEDING SUPPLEMENT, NEPRO CARB STEADY,) LIQD Take 237 mLs by mouth 2 (two) times daily between meals. (Patient not taking: Reported on 06/08/2017)    FAMILY HISTORY:  His indicated that his mother is deceased. He indicated that his father is deceased. He indicated that the status of his sister is unknown. He indicated that the status of his brother is unknown. He indicated that the status of his neg hx is unknown.    SOCIAL HISTORY: He  reports that he has been smoking Cigarettes.  He has a 12.50 pack-year smoking history. He has never used smokeless tobacco. He reports that he does not drink alcohol or use drugs.  REVIEW OF SYSTEMS:   Unable to assess  SUBJECTIVE:   VITAL SIGNS: BP (!) 94/33   Pulse 87   Temp (!) 97.2 F (36.2 C) (Axillary)   Resp (!) 28   Ht 5\' 8"  (1.727 m)   Wt 123 lb 10.9 oz (56.1 kg)   SpO2 100%   BMI 18.81 kg/m   HEMODYNAMICS:    VENTILATOR SETTINGS: Vent Mode: PRVC FiO2 (%):  [40 %-100 %] 40 % Set Rate:  [18 bmp] 18 bmp Vt Set:  [580 mL] 580 mL PEEP:  [5 cmH20] 5 cmH20 Plateau Pressure:  [18 cmH20] 18 cmH20  INTAKE /  OUTPUT: I/O last 3 completed shifts: In: 360 [P.O.:360] Out: 580 [Other:580]  PHYSICAL EXAMINATION: General:  Elderly male lying in bed unresponsive  HEENT: MM pink, ETT Neuro: Unresponsive  CV:  rrr PULM: even/non-labored on MV Extremities: warm/dry, R BKA Skin: no rashes  LABS:  BMET  Recent Labs Lab 06/09/17 0430 06/09/17 1442 06/10/17 0342  NA 133* 132* 130*  K 4.6 5.3* 5.7*  CL 92* 91* 90*  CO2 24 20* 19*  BUN 38* 50* 65*  CREATININE 8.49* 9.97* 11.03*  GLUCOSE 63* 140* 110*    Electrolytes  Recent Labs Lab 06/08/17 1407 06/09/17 0430 06/09/17 1442 06/10/17 0342  CALCIUM 9.1 8.3* 8.3* 8.4*  PHOS 8.0* 5.9*  --  8.8*    CBC  Recent Labs Lab 06/16/2017 1014 06/15/2017 1047 06/09/17 1442 06/10/17 0342  WBC 13.9*  --  13.6* 13.2*  HGB 15.4 18.4* 15.0 15.9  HCT 45.9 54.0* 44.1 46.9  PLT 235  --  197 202    Coag's No results for input(s): APTT, INR in the last 168 hours.  Sepsis Markers No results for input(s): LATICACIDVEN, PROCALCITON, O2SATVEN in the last 168  hours.  ABG  Recent Labs Lab 06/27/2017 1227  PHART 7.387  PCO2ART 26.8*  PO2ART 462.0*    Liver Enzymes  Recent Labs Lab 06/15/2017 1014 06/08/17 1407 06/09/17 1442 06/10/17 0342  AST 23  --  25  --   ALT 12*  --  13*  --   ALKPHOS 56  --  56  --   BILITOT 0.9  --  QUANTITY NOT SUFFICIENT, UNABLE TO PERFORM TEST  --   ALBUMIN 4.2 4.0 4.0 3.9    Cardiac Enzymes  Recent Labs Lab 06/26/2017 1846 06/26/2017 2136 06/08/17 0017  TROPONINI 0.09* 0.08* 0.08*    Glucose  Recent Labs Lab 06/10/17 0952 06/10/17 1138 06/10/17 2044 06/12/2017 0726 06/18/2017 1036 06/12/2017 1208  GLUCAP 111* 240* 196* 219* 196* 126*    Imaging No results found.   STUDIES:  06/09/2017 TTE >>Technically difficult; definity used; normal LV systolic   function; moderate LVH; mild diastolic dysfunction  LHC 9/13 >>  CULTURES: [facility] 9/11 >>  ANTIBIOTICS: none  SIGNIFICANT EVENTS: 9/9 Admit  with CP and HTN urgency 9/13 PEA arrest x 2, LHC  LINES/TUBES: 9/13 ETT >>  DISCUSSION: 32 yoM w/ESRD admitted and evaluated for CP and HTN urgency. Had syncopal event followed by PEA arrest for 4 mins.  Tx to ICU    ASSESSMENT / PLAN:  PULMONARY A: Acute respiratory insufficiency in the setting of cardiac arrest Hx presumed COPD- no PFTs/ no nebs at home, tobacco abuse P:   PRVC 8cc/kg Wean Fio2/PEEP for sats > 92% ABG prn  CXR pending Duonebs prn   CARDIOVASCULAR A:  PEA cardiac arrest x 2, cumulative 8 mins of CPR, concern for acute ACS with worsening STD on EKG Hx HTN, HLD, PAD, PVD P:  Tele monitoring Cards notified, patient going for emergent cardiac cath Trend EKG and Troponin Management per Cards  2L cold NS now per TTM protocol CVL and aline being inserted Levophed for MAP > 60 Trend lactate  RENAL A:   ESRD on MWF, last HD 9/12 Presumed Hyperkalemia P:   Stat electrolytes pending Will notify Nephrology Trend BMP  Replace electrolytes as indicated  GASTROINTESTINAL A:   GERD P:   NPO Place OGT PPI for SUP  HEMATOLOGIC A:   Anemia of chronic illness P:  Trend CBC Transfuse for Hgb <7 Defer VTE till after cardiac catheterization  INFECTIOUS A:   No obvious source P:   Trend WBC monitor  ENDOCRINE A:   DM P:   CBG hourly SSI  NEUROLOGIC A:   Acute encephalopathy in the setting of cardiac arrest, concern for anoxia P:   RASS goal: -5 TTM 33 degrees for neuro protection Fentanyl/ versed gtt Nimbex gtt and TOF monitoring per protocol EEG when able   FAMILY  - Updates: No family at bedside.   - Inter-disciplinary family meet or Palliative Care meeting due by:  06/18/17  CCT 60 mins  STAFF NOTE: I, Merrie Roof, MD FACP have personally reviewed patient's available data, including medical history, events of note, physical examination and test results as part of my evaluation. I have discussed with resident/NP and other  care providers such as pharmacist, RN and RRT. In addition, I personally evaluated patient and elicited key findings of: not awake, post arrest on floors, per sluggish, jvd wnl or low, chest clear, not fc, abdo soft, amputation rt, left wnl, I reviewed pcxr which did not show infiltrate showed mild edema, ECG prior showed St down slowping ST segments, which  post arrest are much more diffuse involving the entire precordium, I am concerned about ischemia driving this event, he coded again upon arrival in ICU, ACLS 4 min,  Access placed, IJ placed, d/w cardiology to cath lab, HIS abg is reviewed can reduce O2, keep MV same, give now arrest time 8 min , will move to hypothermia protocol, fent, versed, nimbex, goal 33 degrees, if cath neg would move to head CT, MAP goal will become 80-85 on hypothermia, get cvp, abg repeat, stat lytes, K, trop, lactic, I updated son in room and allowed him to be present in room for acls The patient is critically ill with multiple organ systems failure and requires high complexity decision making for assessment and support, frequent evaluation and titration of therapies, application of advanced monitoring technologies and extensive interpretation of multiple databases.   Critical Care Time devoted to patient care services described in this note is 90 Minutes. This time reflects time of care of this signee: Merrie Roof, MD FACP. This critical care time does not reflect procedure time, or teaching time or supervisory time of PA/NP/Med student/Med Resident etc but could involve care discussion time. Rest per NP/medical resident whose note is outlined above and that I agree with   Lavon Paganini. Titus Mould, MD, [place] Pgr: [place] [facility] 05/31/2017 3:12 PM

## 2017-06-11 NOTE — Progress Notes (Signed)
Pt returned from CT scan, reconnected to [facility]

## 2017-06-11 NOTE — Progress Notes (Signed)
RN went to check on patient after PT called and stated he was having syncopal symptoms. RN checked BP and SBP was in 110s. RN tried to check BP with patient in sitting position and patient had a syncopal episode.  Patient regained consciousness. BP was 98/71. Provider was paged and orders were given to give 282ml bolus and hold all BP medications. RN was getting fluids set up in room and patient went unresponsive and was have agonal breathing. RN checked for pulse and did not feel one. Compressions were started and code blue was initiated.

## 2017-06-11 NOTE — Code Documentation (Signed)
Called to Code blue in 2MW09 with Junior Code pager.  At the time of arrival, ACLS protocol underway with ICU physicians in attendance.  Was dismissed shortly after arrival.  Rory Percy, DO PGY-1, [place] [facility] 06/26/2017 12:47 PM

## 2017-06-11 NOTE — Progress Notes (Addendum)
Progress Note  Patient Name: Gerald Davenport Date of Encounter: 06/15/2017  Primary Cardiologist: Dr. Radford Pax  Subjective   Patient completed PT thi sam but then had a syncopal episode with BP 98/52mmHg and given a fluid bolus.  Patient then became unresponsive with agonal breathing and bradycardia and then lost pulse and code called.  Transferred to ICU and have PEA arrest. No intubated s/p resuscitation.  EKG with diffuse ST depression throughout.   Inpatient Medications    Scheduled Meds: . aspirin EC  81 mg Oral Daily  . buPROPion  150 mg Oral Daily  . calcitRIOL  0.75 mcg Oral Q M,W,F-HD  . calcium acetate  667-2,001 mg Oral TID WC  . cinacalcet  30 mg Oral Q breakfast  . clopidogrel  75 mg Oral Daily  . docusate sodium  100 mg Oral BID  . doxazosin  8 mg Oral Daily  . feeding supplement (NEPRO CARB STEADY)  237 mL Oral BID BM  . fluorometholone  1 drop Both Eyes QHS  . heparin  5,000 Units Subcutaneous Q8H  . insulin aspart  0-5 Units Subcutaneous QHS  . insulin aspart  0-9 Units Subcutaneous TID WC  . insulin detemir  10 Units Subcutaneous QHS  . linaclotide  145 mcg Oral QAC breakfast  . multivitamin  1 tablet Oral QHS  . pantoprazole  40 mg Oral Daily  . polyvinyl alcohol  1 drop Both Eyes QHS  . sevelamer carbonate  800 mg Oral TID WC  . simvastatin  20 mg Oral QHS   Continuous Infusions: . ferric gluconate (FERRLECIT/NULECIT) IV 62.5 mg (06/10/17 1718)  . propofol (DIPRIVAN) infusion    . propofol     PRN Meds: acetaminophen, fentaNYL (SUBLIMAZE) injection, fentaNYL (SUBLIMAZE) injection, midazolam, midazolam, oxyCODONE, psyllium, sorbitol   Vital Signs    Vitals:   06/03/2017 0939 06/24/2017 1208 06/20/2017 1211 06/14/2017 1215  BP: (!) 111/59   (!) 94/33  Pulse: 93  91 87  Resp:    (!) 28  Temp:   (!) 97.2 F (36.2 C)   TempSrc:   Axillary   SpO2:  95% 95% 100%  Weight:      Height:        Intake/Output Summary (Last 24 hours) at 06/28/2017 1256 Last data  filed at 06/20/2017 0900  Gross per 24 hour  Intake              240 ml  Output              700 ml  Net             -460 ml   Filed Weights   06/10/17 1550 06/10/17 1900 06/15/2017 0422  Weight: 125 lb 14.1 oz (57.1 kg) 129 lb 6.6 oz (58.7 kg) 123 lb 10.9 oz (56.1 kg)    Telemetry    Sinus tachycardia - Personally Reviewed  ECG    NSR with diffuse ST depression - Personally Reviewed  Physical Exam   GEN: intubated Unable to perform exam due to patient draped for central line placement  Labs    Chemistry Recent Labs Lab 06/27/2017 1014  06/08/17 1407 06/09/17 0430 06/09/17 1442 06/10/17 0342  NA 139  < > 138 133* 132* 130*  K 4.6  < > 4.7 4.6 5.3* 5.7*  CL 88*  < > 86* 92* 91* 90*  CO2 27  --  31 24 20* 19*  GLUCOSE 282*  < > 152* 63* 140* 110*  BUN 44*  < > 69* 38* 50* 65*  CREATININE 11.71*  < > 14.18* 8.49* 9.97* 11.03*  CALCIUM 9.8  --  9.1 8.3* 8.3* 8.4*  PROT 9.3*  --   --   --  8.8*  --   ALBUMIN 4.2  --  4.0  --  4.0 3.9  AST 23  --   --   --  25  --   ALT 12*  --   --   --  13*  --   ALKPHOS 56  --   --   --  56  --   BILITOT 0.9  --   --   --  QUANTITY NOT SUFFICIENT, UNABLE TO PERFORM TEST  --   GFRNONAA 4*  --  3* 5* 4* 4*  GFRAA 4*  --  3* 6* 5* 4*  ANIONGAP 24*  --  21* 17* 21* 21*  < > = values in this interval not displayed.   Hematology Recent Labs Lab 06/11/2017 1014 06/18/2017 1047 06/09/17 1442 06/10/17 0342  WBC 13.9*  --  13.6* 13.2*  RBC 4.30  --  4.14* 4.50  HGB 15.4 18.4* 15.0 15.9  HCT 45.9 54.0* 44.1 46.9  MCV 106.7*  --  106.5* 104.2*  MCH 35.8*  --  36.2* 35.3*  MCHC 33.6  --  34.0 33.9  RDW 17.0*  --  17.4* 17.1*  PLT 235  --  197 202    Cardiac Enzymes Recent Labs Lab 06/06/2017 1846 06/02/2017 2136 06/08/17 0017  TROPONINI 0.09* 0.08* 0.08*    Recent Labs Lab 06/01/2017 1030  TROPIPOC 0.02     BNPNo results for input(s): BNP, PROBNP in the last 168 hours.   DDimer No results for input(s): DDIMER in the last 168  hours.   Radiology    No results found.  Cardiac Studies   Study Conclusions  - Left ventricle: The cavity size was normal. Wall thickness was   increased in a pattern of moderate LVH. Systolic function was   normal. The estimated ejection fraction was in the range of 55%   to 60%. Wall motion was normal; there were no regional wall   motion abnormalities. Doppler parameters are consistent with   abnormal left ventricular relaxation (grade 1 diastolic   dysfunction). - Mitral valve: Calcified annulus.  Impressions:  - Technically difficult; definity used; normal LV systolic   function; moderate LVH; mild diastolic dysfunction.   Patient Profile     76 y.o. male hx of HTN, HLD, DM and ESRD on HD and was seen by Dr. Radford Pax in 2015 for SOB with Dialysis, his nuc was normal and Echo with normal EF and mildly leaky AV. He is RBKA for PAD and ulcer. He has dialysis MWF. Initially he had nausea after getting hot and also complained of chest pain and SOB. These symptoms would come and go but by Sat night he was vomiting- brown liquid. His wife notes that he is confused at home at times. Here he is pulling off clothes at times. He complains of severe pain and then no pain.   Assessment & Plan    1. Chest painwith abnormal EKG appeared to be LVH with repol although also could represent ischemia.  He had HTN urgency on admission.  - Trop mildly elevated but flat trend and likely related to demand ischemia in the setting of HTN urgency as well as ESRD. - patient denied any chest pain prior to hospitalization - hold imdur  in light of cardiac arrest - continue ASA 81mg  daily and Plavix 75mg  daily. - continue statin - 2D echo with normal LVF -  case was discussed with Nephrology yesterday and as patient had denied any CP with normal LVF and problems with confusion it was decided to pursue medical management at this time and set up for outpt nuclear stress test.  2.  Cardiac  arrest - per CCM had bradycardia and then PEA arrest.  EKG now with worsening diffuse ST depression.   - resuscitated and intubated. - will plan emergent cardiac cath - case discussed with Dr. Gwenlyn Found  3.  HTN - May have been increased with aggitation. - BP borderline controlled - hold BP meds  4.  HLD  - continue statin - LDL at goal at 64  5.  ESRD on HD - per renal  6. DM  - per IM  7. Prolonged QTc - 439msec today - zofran stopped - stop seroquel  For questions or updates, please contact [facility] Please consult [email or web address] for contact info under Cardiology/STEMI.      Signed, Fransico Him, MD  06/12/2017, 12:56 PM

## 2017-06-11 NOTE — Progress Notes (Signed)
Responded to page to support son at bedside. Patient coded.  Patient is going to cath lab for further testing. Patient wife arrived and is with son in waiting area.  Supported Biochemist, clinical and facilitated information sharing between staff and family. Escorted family to cath lab waiting and provided information regarding process. Provided empathetic listening, emotional and spiritual support.  Will follow as needed.   06/01/2017 1316  Clinical Encounter Type  Visited With Patient and family together;Health care provider  Visit Type Initial;Spiritual support;Code  Referral From Nurse  Spiritual Encounters  Spiritual Needs Emotional  Stress Factors  Family Stress Factors None identified  Cristopher Peru, [facility], Pager [phone]

## 2017-06-11 NOTE — H&P (Signed)
ACLS  In ICU PEA ACLS followed and I empircally treated hyperK Back after 4 min  rosc That s now 8 min in total Consider hypothermia  Cath needed  Lavon Paganini. Titus Mould, MD, [place] Pgr: [place] [facility]

## 2017-06-11 NOTE — Procedures (Signed)
Central Venous Catheter Insertion Procedure Note Gerald Davenport [id number] September 15, 1941  Procedure: Insertion of Central Venous Catheter Indications: Assessment of intravascular volume, Drug and/or fluid administration and Frequent blood sampling  Procedure Details Consent: Unable to obtain consent because of coding, access. Time Out: Verified patient identification, verified procedure, site/side was marked, verified correct patient position, special equipment/implants available, medications/allergies/relevent history reviewed, required imaging and test results available.  Performed  Maximum sterile technique was used including antiseptics, cap, gloves, gown, hand hygiene, mask and sheet. Skin prep: Chlorhexidine; local anesthetic administered A antimicrobial bonded/coated triple lumen catheter was placed in the left femoral vein due to emergent situation using the Seldinger technique.  Evaluation Blood flow good Complications: No apparent complications Patient did tolerate procedure well. Chest X-ray ordered to verify placement.  CXR: normal.  Gerald Davenport. 06/15/2017, 1:21 PM  Gerald Davenport. Titus Mould, MD, [place] Pgr: [place] [facility]

## 2017-06-11 NOTE — Code Documentation (Signed)
Pt with PEA arrest, code blue called.  ACLS protocol started.  ROSC achieved. See code blue sheet for details.

## 2017-06-11 NOTE — Plan of Care (Signed)
Problem: Physical Regulation: Goal: Ability to maintain clinical measurements within normal limits will improve Outcome: Progressing VSS throughout shift. Pt having intermittent confusion. Easily reoriented.  Attempted to assist pt to commode. Pt very weak unable to provide much assistance.  Pt was than assisted back to bed and used bedpan. Pt remains on RA. SpO2 >94%^. Will continue to monitor.

## 2017-06-11 NOTE — Anesthesia Procedure Notes (Signed)
Procedure Name: Intubation Performed by: Oletta Lamas Pre-anesthesia Checklist: Patient identified, Emergency Drugs available, Suction available, Patient being monitored and Timeout performed Patient Re-evaluated:Patient Re-evaluated prior to induction Oxygen Delivery Method: Ambu bag Preoxygenation: Pre-oxygenation with 100% oxygen Induction Type: IV induction Laryngoscope Size: Mac and 3 Grade View: Grade I Tube type: Subglottic suction tube Tube size: 7.5 mm Number of attempts: 1 Airway Equipment and Method: Stylet Placement Confirmation: ETT inserted through vocal cords under direct vision,  CO2 detector and breath sounds checked- equal and bilateral Secured at: 22 cm Tube secured with: Tape Dental Injury: Teeth and Oropharynx as per pre-operative assessment

## 2017-06-11 NOTE — Progress Notes (Signed)
Disconnected pt from [facility] for transport to CT.

## 2017-06-11 NOTE — Progress Notes (Addendum)
Inpatient Rehabilitation  Attempted to meet with patient this morning to discuss team's recommendation for IP Rehab; however, upon my arrival patient with limited verbal responses with PT attempting to sit edge of bed.  Suspect orthostatic BP issues.  As a result, patient is not medically or physically ready for IP Rehab today.  I have insurance authorization and will continue to follow for timing of medical readiness and IP Rehab bed availability.  Please call with questions.   Carmelia Roller., CCC/SLP Admission Coordinator  [place]  Cell [phone]

## 2017-06-11 NOTE — Plan of Care (Signed)
Problem: Nutrition: Goal: Adequate nutrition will be maintained Outcome: Not Progressing Pt is now paralysed, will readdress nutrition once rewarmed

## 2017-06-12 ENCOUNTER — Inpatient Hospital Stay (HOSPITAL_COMMUNITY): Payer: Medicare Other

## 2017-06-12 LAB — BASIC METABOLIC PANEL
ANION GAP: 14 (ref 5–15)
ANION GAP: 14 (ref 5–15)
ANION GAP: 15 (ref 5–15)
ANION GAP: 12 (ref 5–15)
ANION GAP: 11 (ref 5–15)
Anion gap: 12 (ref 5–15)
BUN: 68 mg/dL — ABNORMAL HIGH (ref 6–20)
BUN: 72 mg/dL — ABNORMAL HIGH (ref 6–20)
BUN: 73 mg/dL — ABNORMAL HIGH (ref 6–20)
BUN: 63 mg/dL — ABNORMAL HIGH (ref 6–20)
BUN: 52 mg/dL — ABNORMAL HIGH (ref 6–20)
BUN: 44 mg/dL — AB (ref 6–20)
CALCIUM: 7.2 mg/dL — AB (ref 8.9–10.3)
CALCIUM: 7 mg/dL — AB (ref 8.9–10.3)
CALCIUM: 7.2 mg/dL — AB (ref 8.9–10.3)
CHLORIDE: 101 mmol/L (ref 101–111)
CO2: 19 mmol/L — ABNORMAL LOW (ref 22–32)
CO2: 19 mmol/L — ABNORMAL LOW (ref 22–32)
CO2: 17 mmol/L — ABNORMAL LOW (ref 22–32)
CO2: 19 mmol/L — ABNORMAL LOW (ref 22–32)
CO2: 19 mmol/L — ABNORMAL LOW (ref 22–32)
CO2: 20 mmol/L — ABNORMAL LOW (ref 22–32)
CREATININE: 5.25 mg/dL — AB (ref 0.61–1.24)
Calcium: 7.4 mg/dL — ABNORMAL LOW (ref 8.9–10.3)
Calcium: 7.3 mg/dL — ABNORMAL LOW (ref 8.9–10.3)
Calcium: 7.2 mg/dL — ABNORMAL LOW (ref 8.9–10.3)
Chloride: 98 mmol/L — ABNORMAL LOW (ref 101–111)
Chloride: 98 mmol/L — ABNORMAL LOW (ref 101–111)
Chloride: 101 mmol/L (ref 101–111)
Chloride: 103 mmol/L (ref 101–111)
Chloride: 103 mmol/L (ref 101–111)
Creatinine, Ser: 8.03 mg/dL — ABNORMAL HIGH (ref 0.61–1.24)
Creatinine, Ser: 8.39 mg/dL — ABNORMAL HIGH (ref 0.61–1.24)
Creatinine, Ser: 8.71 mg/dL — ABNORMAL HIGH (ref 0.61–1.24)
Creatinine, Ser: 7.17 mg/dL — ABNORMAL HIGH (ref 0.61–1.24)
Creatinine, Ser: 5.99 mg/dL — ABNORMAL HIGH (ref 0.61–1.24)
GFR calc Af Amer: 9 mL/min — ABNORMAL LOW (ref >60)
GFR calc Af Amer: 11 mL/min — ABNORMAL LOW (ref >60)
GFR calc non Af Amer: 9 mL/min — ABNORMAL LOW (ref >60)
GFR, EST AFRICAN AMERICAN: 7 mL/min — AB (ref >60)
GFR, EST AFRICAN AMERICAN: 6 mL/min — AB (ref >60)
GFR, EST AFRICAN AMERICAN: 6 mL/min — AB (ref >60)
GFR, EST AFRICAN AMERICAN: 7 mL/min — AB (ref >60)
GFR, EST NON AFRICAN AMERICAN: 6 mL/min — AB (ref >60)
GFR, EST NON AFRICAN AMERICAN: 5 mL/min — AB (ref >60)
GFR, EST NON AFRICAN AMERICAN: 5 mL/min — AB (ref >60)
GFR, EST NON AFRICAN AMERICAN: 6 mL/min — AB (ref >60)
GFR, EST NON AFRICAN AMERICAN: 8 mL/min — AB (ref >60)
GLUCOSE: 136 mg/dL — AB (ref 65–99)
GLUCOSE: 103 mg/dL — AB (ref 65–99)
GLUCOSE: 97 mg/dL (ref 65–99)
GLUCOSE: 96 mg/dL (ref 65–99)
Glucose, Bld: 90 mg/dL (ref 65–99)
Glucose, Bld: 91 mg/dL (ref 65–99)
POTASSIUM: 5.1 mmol/L (ref 3.5–5.1)
POTASSIUM: 5.2 mmol/L — AB (ref 3.5–5.1)
POTASSIUM: 4.4 mmol/L (ref 3.5–5.1)
POTASSIUM: 4.2 mmol/L (ref 3.5–5.1)
POTASSIUM: 4.6 mmol/L (ref 3.5–5.1)
Potassium: 4.6 mmol/L (ref 3.5–5.1)
SODIUM: 131 mmol/L — AB (ref 135–145)
SODIUM: 133 mmol/L — AB (ref 135–145)
SODIUM: 134 mmol/L — AB (ref 135–145)
SODIUM: 133 mmol/L — AB (ref 135–145)
Sodium: 131 mmol/L — ABNORMAL LOW (ref 135–145)
Sodium: 133 mmol/L — ABNORMAL LOW (ref 135–145)

## 2017-06-12 LAB — HEPATIC FUNCTION PANEL
ALT: 109 U/L — ABNORMAL HIGH (ref 17–63)
AST: 118 U/L — AB (ref 15–41)
Albumin: 2.2 g/dL — ABNORMAL LOW (ref 3.5–5.0)
Alkaline Phosphatase: 37 U/L — ABNORMAL LOW (ref 38–126)
BILIRUBIN DIRECT: 0.1 mg/dL (ref 0.1–0.5)
BILIRUBIN INDIRECT: 0.3 mg/dL (ref 0.3–0.9)
Total Bilirubin: 0.4 mg/dL (ref 0.3–1.2)
Total Protein: 5.2 g/dL — ABNORMAL LOW (ref 6.5–8.1)

## 2017-06-12 LAB — POCT ACTIVATED CLOTTING TIME
ACTIVATED CLOTTING TIME: 169 s
ACTIVATED CLOTTING TIME: 191 s
ACTIVATED CLOTTING TIME: 191 s
ACTIVATED CLOTTING TIME: 197 s
ACTIVATED CLOTTING TIME: 208 s
Activated Clotting Time: 175 seconds

## 2017-06-12 LAB — GLUCOSE, CAPILLARY
GLUCOSE-CAPILLARY: 188 mg/dL — AB (ref 65–99)
GLUCOSE-CAPILLARY: 144 mg/dL — AB (ref 65–99)
GLUCOSE-CAPILLARY: 88 mg/dL (ref 65–99)
GLUCOSE-CAPILLARY: 88 mg/dL (ref 65–99)
GLUCOSE-CAPILLARY: 99 mg/dL (ref 65–99)
Glucose-Capillary: 108 mg/dL — ABNORMAL HIGH (ref 65–99)
Glucose-Capillary: 125 mg/dL — ABNORMAL HIGH (ref 65–99)
Glucose-Capillary: 169 mg/dL — ABNORMAL HIGH (ref 65–99)
Glucose-Capillary: 82 mg/dL (ref 65–99)
Glucose-Capillary: 91 mg/dL (ref 65–99)
Glucose-Capillary: 92 mg/dL (ref 65–99)
Glucose-Capillary: 97 mg/dL (ref 65–99)

## 2017-06-12 LAB — RENAL FUNCTION PANEL
ALBUMIN: 2.2 g/dL — AB (ref 3.5–5.0)
Anion gap: 14 (ref 5–15)
BUN: 59 mg/dL — AB (ref 6–20)
CALCIUM: 7.1 mg/dL — AB (ref 8.9–10.3)
CHLORIDE: 101 mmol/L (ref 101–111)
CO2: 18 mmol/L — ABNORMAL LOW (ref 22–32)
CREATININE: 6.67 mg/dL — AB (ref 0.61–1.24)
GFR, EST AFRICAN AMERICAN: 8 mL/min — AB (ref >60)
GFR, EST NON AFRICAN AMERICAN: 7 mL/min — AB (ref >60)
Glucose, Bld: 92 mg/dL (ref 65–99)
PHOSPHORUS: 6.3 mg/dL — AB (ref 2.5–4.6)
Potassium: 4.4 mmol/L (ref 3.5–5.1)
Sodium: 133 mmol/L — ABNORMAL LOW (ref 135–145)

## 2017-06-12 LAB — CBC
HCT: 30.9 % — ABNORMAL LOW (ref 39.0–52.0)
HEMOGLOBIN: 10.6 g/dL — AB (ref 13.0–17.0)
MCH: 34.9 pg — ABNORMAL HIGH (ref 26.0–34.0)
MCHC: 34.3 g/dL (ref 30.0–36.0)
MCV: 101.6 fL — AB (ref 78.0–100.0)
PLATELETS: 168 10*3/uL (ref 150–400)
RBC: 3.04 MIL/uL — AB (ref 4.22–5.81)
RDW: 17.1 % — ABNORMAL HIGH (ref 11.5–15.5)
WBC: 6.2 10*3/uL (ref 4.0–10.5)

## 2017-06-12 LAB — APTT: aPTT: 70 seconds — ABNORMAL HIGH (ref 24–36)

## 2017-06-12 LAB — TROPONIN I
TROPONIN I: 0.44 ng/mL — AB (ref <0.03)
TROPONIN I: 0.48 ng/mL — AB (ref <0.03)

## 2017-06-12 LAB — PROTIME-INR
INR: 2.16
PROTHROMBIN TIME: 23.9 s — AB (ref 11.4–15.2)

## 2017-06-12 MED ORDER — ASPIRIN 81 MG PO CHEW
81.0000 mg | CHEWABLE_TABLET | Freq: Every day | ORAL | Status: DC
  Administered 2017-06-12 – 2017-06-13 (×2): 81 mg
  Filled 2017-06-12 (×2): qty 1

## 2017-06-12 MED ORDER — ALTEPLASE 2 MG IJ SOLR: 2.0000 mg | Freq: Once | INTRAMUSCULAR | Status: DC | PRN

## 2017-06-12 MED ORDER — ATROPINE SULFATE 1 MG/10ML IJ SOSY
PREFILLED_SYRINGE | INTRAMUSCULAR | Status: AC
  Filled 2017-06-12: qty 10

## 2017-06-12 MED ORDER — HEPARIN SODIUM (PORCINE) 1000 UNIT/ML DIALYSIS: 1000.0000 [IU] | INTRAMUSCULAR | Status: DC | PRN

## 2017-06-12 MED ORDER — SIMVASTATIN 20 MG PO TABS
20.0000 mg | ORAL_TABLET | Freq: Every day | ORAL | Status: DC
  Administered 2017-06-12: 20 mg
  Filled 2017-06-12: qty 1

## 2017-06-12 MED ORDER — PRISMASOL BGK 4/2.5 32-4-2.5 MEQ/L IV SOLN
INTRAVENOUS | Status: DC
  Administered 2017-06-12 – 2017-06-13 (×8): via INTRAVENOUS_CENTRAL
  Filled 2017-06-12 (×17): qty 5000

## 2017-06-12 MED ORDER — PRISMASOL BGK 4/2.5 32-4-2.5 MEQ/L IV SOLN
INTRAVENOUS | Status: DC
  Administered 2017-06-12 – 2017-06-13 (×2): via INTRAVENOUS_CENTRAL
  Filled 2017-06-12 (×4): qty 5000

## 2017-06-12 MED ORDER — CALCITRIOL 1 MCG/ML PO SOLN
0.7500 ug | ORAL | Status: DC
  Administered 2017-06-12: 0.75 ug
  Filled 2017-06-12: qty 0.75

## 2017-06-12 MED ORDER — SODIUM CHLORIDE 0.9 % FOR CRRT
INTRAVENOUS_CENTRAL | Status: DC | PRN
  Filled 2017-06-12: qty 1000

## 2017-06-12 MED ORDER — PRISMASOL BGK 4/2.5 32-4-2.5 MEQ/L IV SOLN
INTRAVENOUS | Status: DC
  Administered 2017-06-12: 13:00:00 via INTRAVENOUS_CENTRAL
  Filled 2017-06-12 (×2): qty 5000

## 2017-06-12 MED ORDER — HEPARIN SODIUM (PORCINE) 5000 UNIT/ML IJ SOLN
5000.0000 [IU] | Freq: Three times a day (TID) | INTRAMUSCULAR | Status: DC
  Administered 2017-06-12 – 2017-06-13 (×3): 5000 [IU] via SUBCUTANEOUS
  Filled 2017-06-12 (×3): qty 1

## 2017-06-12 MED ORDER — CHLORHEXIDINE GLUCONATE CLOTH 2 % EX PADS
6.0000 | MEDICATED_PAD | Freq: Every day | CUTANEOUS | Status: DC
  Administered 2017-06-12 – 2017-06-13 (×2): 6 via TOPICAL

## 2017-06-12 MED ORDER — CALCITRIOL 0.25 MCG PO CAPS: 0.7500 ug | ORAL_CAPSULE | ORAL | Status: DC

## 2017-06-12 MED FILL — Lidocaine HCl Local Inj 2%: INTRAMUSCULAR | Qty: 10 | Status: AC

## 2017-06-12 MED FILL — Medication: Qty: 1 | Status: AC

## 2017-06-12 NOTE — Progress Notes (Signed)
CRITICAL VALUE ALERT  Critical Value:  Troponin 0.44  Date & Time Notied:  06/12/2017 at [place]  Provider Notified: Radford Pax, MD  Orders Received/Actions taken: No new orders given at this time.

## 2017-06-12 NOTE — Progress Notes (Signed)
Rewarming phase started on [place], settings verified by second RN, Warden Fillers

## 2017-06-12 NOTE — Progress Notes (Signed)
[facility] Progress Note  Subjective: on vent, sedated and on paralytics, cooling protocol   Vitals:   06/12/17 1000 06/12/17 1015 06/12/17 1030 06/12/17 1045  BP: (!) 141/45  (!) 147/31   Pulse: 69 70 69 70  Resp: _0 Temp:      TempSrc:      SpO2: 100% 100% 100% 100%  Weight:      Height:        Inpatient medications: . artificial tears  1 application Both Eyes P2R  . aspirin  81 mg Per Tube Daily  . atropine      . calcitRIOL  0.75 mcg Per Tube Q M,W,F-HD  . chlorhexidine gluconate (MEDLINE KIT)  15 mL Mouth Rinse BID  . Chlorhexidine Gluconate Cloth  6 each Topical Daily  . fentaNYL (SUBLIMAZE) injection  50 mcg Intravenous Once  . fluorometholone  1 drop Both Eyes QHS  . heparin subcutaneous  5,000 Units Subcutaneous Q8H  . mouth rinse  15 mL Mouth Rinse 10 times per day  . midazolam  1 mg Intravenous Once  . multivitamin  1 tablet Oral QHS  . pantoprazole (PROTONIX) IV  40 mg Intravenous Daily  . simvastatin  20 mg Per Tube QHS  . sodium chloride flush  3 mL Intravenous Q12H  . ticagrelor  90 mg Per Tube Q12H   . sodium chloride    . cisatracurium (NIMBEX) infusion 1 mcg/kg/min (06/12/17 0800)  . dextrose 5 % and 0.45% NaCl 50 mL/hr at 06/12/17 0800  . fentaNYL infusion INTRAVENOUS 200 mcg/hr (06/12/17 0800)  . ferric gluconate (FERRLECIT/NULECIT) IV 62.5 mg (06/10/17 1718)  . midazolam (VERSED) infusion 2 mg/hr (06/12/17 0800)  . norepinephrine (LEVOPHED) Adult infusion 18 mcg/min (06/12/17 1000)  . sodium chloride     sodium chloride, acetaminophen, [COMPLETED] cisatracurium **AND** cisatracurium (NIMBEX) infusion **AND** cisatracurium, fentaNYL, fentaNYL (SUBLIMAZE) injection, fentaNYL (SUBLIMAZE) injection, ipratropium-albuterol, midazolam, midazolam, midazolam, ondansetron (ZOFRAN) IV, sodium chloride flush  Exam: On vent, paralytics/ sedation No repsonse No jvd Chest CTA bilat RRR 1/6 sem no RG Abd scaphoid, soft ND Ext R BKA  no edema, no LLE edema R IJ perm cath   CXR 9/9, 9/13 - no edema   Dialysis: [facility]   MWF 4h    61kg    R IJ cath (refused other access)   Hep none -calcitriol 0.75 ug -venofer 50 /wk      Impression: 1  SP PEA arrest - on cooling protocol  2  CP, hx CAD - sp cath 9/13 w PCI to 99% RCA 3  ESRD HD mwf 4  HTN - low bp's due to cooling/ arrest, on pressors now 5  Anemia ok no ESA 6  DM controlled 7  PVD 8  COPD - on nebs at home  Plan - CRRT , see orders   Kelly Splinter MD [facility] pager [phone]   06/12/2017, 11:18 AM    Recent Labs Lab 06/09/17 0430  06/10/17 0342  06/17/2017 1307  06/18/2017 1759  06/17/2017 2206 06/12/17 0209 06/12/17 0628  NA 133*  < > 130*  < >  --   < > 133*  < > 136 131* 131*  K 4.6  < > 5.7*  < >  --   < > 4.7  < > 5.3* 5.1 5.2*  CL 92*  < > 90*  < >  --   < > 100*  < > 99* 98* 98*  CO2 24  < >  19*  < >  --   --  18*  --   --  19* 19*  GLUCOSE 63*  < > 110*  < >  --   < > 101*  < > 59* 136* 103*  BUN 38*  < > 65*  < >  --   < > 64*  < > 68* 68* 72*  CREATININE 8.49*  < > 11.03*  < >  --   < > 7.94*  < > 8.10* 8.03* 8.39*  CALCIUM 8.3*  < > 8.4*  < >  --   --  8.2*  --   --  7.4* 7.2*  PHOS 5.9*  --  8.8*  --  10.6*  --   --   --   --   --   --   < > = values in this interval not displayed.  Recent Labs Lab 06/08/2017 1014  06/09/17 1442 06/10/17 0342 06/26/2017 1303  AST 23  --  25  --  162*  ALT 12*  --  13*  --  106*  ALKPHOS 56  --  56  --  45  BILITOT 0.9  --  QUANTITY NOT SUFFICIENT, UNABLE TO PERFORM TEST  --  0.8  PROT 9.3*  --  8.8*  --  5.8*  ALBUMIN 4.2  < > 4.0 3.9 2.5*  < > = values in this interval not displayed.  Recent Labs Lab 06/09/17 1442 06/10/17 0342 06/06/2017 1210  05/30/2017 2003 06/22/2017 2206 06/12/17 0401  WBC 13.6* 13.2* 14.9*  --   --   --  6.2  NEUTROABS 11.1* 10.6* 11.8*  --   --   --   --   HGB 15.0 15.9 13.6  < > 12.2* 11.9* 10.6*  HCT 44.1 46.9 40.2  < > 36.0* 35.0* 30.9*   MCV 106.5* 104.2* 103.9*  --   --   --  101.6*  PLT 197 202 188  --   --   --  168  < > = values in this interval not displayed. Iron/TIBC/Ferritin/ %Sat No results found for: IRON, TIBC, FERRITIN, IRONPCTSAT

## 2017-06-12 NOTE — Progress Notes (Signed)
S/W STEPHANIE @ [facility] # [id number]   1. BRILINTA 90 MG  COVER- YES  CO-PAY- $ 40.00  TIER- 2 DRUG  PRIOR APPROVAL- YES # [phone] [place]   PATIENT TAKING CLOPIDOGREL  PATIENT ALSO IN GAP PHASE   PHARMACY : [facility]

## 2017-06-12 NOTE — Progress Notes (Signed)
PULMONARY / CRITICAL CARE MEDICINE   Name: Gerald Davenport MRN: [id number] DOB: [birth year]    ADMISSION DATE:  06/25/2017 CONSULTATION DATE:  06/12/2017  REFERRING MD:  Dr. 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   Nephrology and Cardiology consulted and following in hospitalization.  He had abnormal EKG, with ST depression on inferior-lateral leads, and mildly elevated troponin's thought related to demand ischemia in the setting of HTN urgency as well as ESRD.  He was being medically managed.  TTE on 9/11 was a technically difficult study but showed a normal EF at 55-60%, moderate LVH, and mild diastolic dysfunction.  Last dialyzed 6/81 without complications.   On 9/13, patient was in his normal state of health and being evaluated to be discharged to inpatient rehab when he had syncopal episode with BP of 98/71.  Patient then became unresponsive, found to be bradycardic and then lost pulse.  PEA arrest for 4 mins prior to ROSC with epi given.  EKG with diffuse ST depression.  Patient remained unresponsive and was intubated for airway protection by anesthesia and moved to the ICU.  PCCM to admit.  Cardiology notified of acute changes and concern for EKG changes.  On arrival to ICU, patient suffered second PEA arrest, ROSC after 4 mins.  Treated with ACLS and presumed hyperkalemia.    SUBJECTIVE: Cooling, will start re-warming at 1500 9/14. BP was low through the night last nigh ( False high readings per a line as it was kinked per RN). Has had to increase levo this am.  VITAL SIGNS: BP (!) 124/34   Pulse 68   Temp (!) 91.4 F (33 C)   Resp 18   Ht 5\' 8"  (1.727 m)   Wt 134 lb 15.1 oz (61.2 kg)   SpO2 100%   BMI 20.52 kg/m    HEMODYNAMICS: CVP:  [9 mmHg-12 mmHg] 9 mmHg  VENTILATOR SETTINGS: Vent Mode: PRVC FiO2 (%):  [40 %-100 %] 40 % Set Rate:  [18 bmp] 18 bmp Vt Set:  [580 mL] 580 mL PEEP:  [5 cmH20] 5 cmH20 Plateau Pressure:  [17 cmH20-21 cmH20] 20 cmH20  INTAKE / OUTPUT: I/O last 3 completed shifts: In: 1348.9 [P.O.:240; I.V.:1008.9; NG/GT:100] Out: 300 [Emesis/NG output:300]  PHYSICAL EXAMINATION: General:  Elderly male lying in bed unresponsive , cooling pads in place at 33 degrees HEENT: MM pink, ETT, normocephalic, atraumatic Neuro: Unresponsive, Nimbex gtt  CV: S1, S2, RRR PULM: even/non-labored on MV, breath sounds essentially clear Extremities: cool to touch, R BKA Skin: no rashes, lesions , intact  LABS:  BMET  Recent Labs Lab 06/06/2017 1759  06/20/2017 2206 06/12/17 0209 06/12/17 0628  NA 133*  < > 136 131* 131*  K 4.7  < > 5.3* 5.1 5.2*  CL 100*  < > 99* 98* 98*  CO2 18*  --   --  19* 19*  BUN 64*  < > 68* 68* 72*  CREATININE 7.94*  < > 8.10* 8.03* 8.39*  GLUCOSE 101*  < > 59* 136* 103*  < > = values in this interval not displayed.  Electrolytes  Recent Labs Lab 06/09/17 0430  06/10/17 0342  06/16/2017 1307 06/21/2017 1759 06/12/17 0209 06/12/17 0628  CALCIUM 8.3*  < >  8.4*  < >  --  8.2* 7.4* 7.2*  MG  --   --   --   --  1.9  --   --   --   PHOS 5.9*  --  8.8*  --  10.6*  --   --   --   < > = values in this interval not displayed.  CBC  Recent Labs Lab 06/10/17 0342 06/04/2017 1210  06/02/2017 2003 06/22/2017 2206 06/12/17 0401  WBC 13.2* 14.9*  --   --   --  6.2  HGB 15.9 13.6  < > 12.2* 11.9* 10.6*  HCT 46.9 40.2  < > 36.0* 35.0* 30.9*  PLT 202 188  --   --   --  168  < > = values in this interval not displayed.  Coag's  Recent Labs Lab 06/10/2017 1542 06/18/2017 2256 06/12/17 0401  APTT 162* 88* 70*  INR 5.37* 2.69 2.16    Sepsis Markers  Recent Labs Lab 06/27/2017 1308 06/04/2017 1614  LATICACIDVEN 7.0* 2.1*    ABG  Recent Labs Lab  06/19/2017 1227 06/16/2017 1251 06/09/2017 1615  PHART 7.387 7.402 7.467*  PCO2ART 26.8* 42.0 28.7*  PO2ART 462.0* 588.0* 334.0*    Liver Enzymes  Recent Labs Lab 06/17/2017 1014  06/09/17 1442 06/10/17 0342 06/22/2017 1303  AST 23  --  25  --  162*  ALT 12*  --  13*  --  106*  ALKPHOS 56  --  56  --  45  BILITOT 0.9  --  QUANTITY NOT SUFFICIENT, UNABLE TO PERFORM TEST  --  0.8  ALBUMIN 4.2  < > 4.0 3.9 2.5*  < > = values in this interval not displayed.  Cardiac Enzymes  Recent Labs Lab 05/31/2017 2107 06/12/17 0209 06/12/17 0628  TROPONINI 0.24* 0.44* 0.48*    Glucose  Recent Labs Lab 06/25/2017 2235 06/12/17 0019 06/12/17 0212 06/12/17 0409 06/12/17 0630 06/12/17 0830  GLUCAP 217* 169* 144* 125* 108* 82    Imaging Ct Head Wo Contrast  Result Date: 06/19/2017 CLINICAL DATA:  Altered level of consciousness. EXAM: CT HEAD WITHOUT CONTRAST TECHNIQUE: Contiguous axial images were obtained from the base of the skull through the vertex without intravenous contrast. COMPARISON:  09/08/2016 FINDINGS: Brain: No evidence of acute infarction, hemorrhage, hydrocephalus, extra-axial collection or mass lesion/mass effect. There is ventricular and sulcal enlargement reflecting mild generalized atrophy. Patchy areas of white matter hypoattenuation noted consistent with mild chronic microvascular ischemic change. 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 These findings are stable. Vascular: No hyperdense vessel or unexpected calcification. Skull: Normal. Negative for fracture or focal lesion. Sinuses/Orbits: Globes and orbits are unremarkable. Sinuses and mastoid air cells are clear. Other: None. IMPRESSION: 1. No acute intracranial abnormalities. 2. Mild generalized atrophy and chronic microvascular ischemic change. Electronically Signed   By: Lajean Manes M.D.   On: 06/07/2017 19:27   Dg Chest Port 1 View  Result Date:  06/12/2017 CLINICAL DATA:  Intubation.  Respiratory failure . EXAM: PORTABLE CHEST 1 VIEW COMPARISON:  06/24/2017.  06/19/2017. FINDINGS: Endotracheal tube, NG tube, dual-lumen right IJ catheter, left IJ catheter in stable position. Right axillary vascular stents noted. Heart size stable. Mild right infrahilar atelectasis/ infiltrate cannot be excluded. No pleural effusion or pneumothorax. IMPRESSION: 1. Lines and tubes in stable position. 2. Mild right infrahilar atelectasis/ infiltrate cannot be excluded . Electronically Signed   By: Marcello Moores  Register   On: 06/12/2017  07:40   Dg Chest Port 1 View  Result Date: 06/09/2017 CLINICAL DATA:  Acute respiratory failure. EXAM: PORTABLE CHEST 1 VIEW COMPARISON:  June 07, 2017. FINDINGS: The cerclage are pad over the central chest. Interval insertion of an endotracheal tube with the tip in good position approximately 3.8 cm above the carina. An enteric tube is noted entering the stomach, with the distal side port just beyond the gastroesophageal junction. New left internal jugular central venous catheter with the tip projecting over the mid SVC. Unchanged tunneled right internal jugular dialysis catheter with the tip at the cavoatrial junction. The cardiomediastinal silhouette is normal in size. Normal pulmonary vascularity. No focal consolidation, pleural effusion, or pneumothorax. No acute osseous abnormality. Unchanged right axillary stent. IMPRESSION: 1. Interval placement of endotracheal and enteric tubes, as well as a left internal jugular central venous catheter, which are in appropriate position. 2. No active cardiopulmonary disease.  No pneumothorax. Electronically Signed   By: Titus Dubin M.D.   On: 06/20/2017 16:07     STUDIES:  06/09/2017 TTE >>Technically difficult; definity used; normal LV systolic   function; moderate LVH; mild diastolic dysfunction   12/25/9240>>AST >>Ost 3rd Mrg lesion, 50 %stenosed. Ost RCA lesion, 99 %stenosed. Post  intervention, there is a 20% residual stenosis.    A stent was successfully placed.  9/13>> CT Head>>No acute intracranial abnormalities. Mild generalized atrophy and chronic microvascular ischemic change.   CULTURES: [facility] 9/11 >>  ANTIBIOTICS: none  SIGNIFICANT EVENTS: 9/9 Admit with CP and HTN urgency 9/13 PEA arrest x 2, LHC 9/14: CVVH  LINES/TUBES: 9/13 ETT >> 9/13 L Femoral CVC>> 9/13 L IJ CVC >> 9/13 L Femoral A Line>>   DISCUSSION: 24 yoM w/ESRD admitted and evaluated for CP and HTN urgency. Had syncopal event followed by PEA arrest x 2  for 8 minutes cumulative Tx to ICU    ASSESSMENT / PLAN:  PULMONARY A: Acute respiratory insufficiency in the setting of cardiac arrest Hx presumed COPD- no PFTs/ no nebs at home, tobacco abuse CXR 9/14>>Mild right infrahilar atelectasis/ infiltrate cannot be excluded  P:   PRVC 8cc/kg Wean Fio2/PEEP for sats > 92% once hypothermia protocol completed ABG prn  CXR daily Duonebs prn   CARDIOVASCULAR A:  PEA cardiac arrest x 2, cumulative 8 mins of CPR, concern for acute ACS with worsening STD on EKG Hx HTN, HLD, PAD, PVD Increased pressor needs 9/14 P:  Tele monitoring Cath with successful stent placement 9/13 Trend EKG and Troponin Trend CVP Management per Cards  2L cold NS now per TTM protocol CVL and aline being inserted Levophed for MAP > 80-85 on hypothermia  Trend lactate  RENAL A:   ESRD on MWF, last HD 9/12 Presumed Hyperkalemia P:   Trend BMET Nephrology on board [facility] CVVH 9/14 per renal Trend BMP  Replace electrolytes as indicated  GASTROINTESTINAL A:   GERD P:   NPO Place OGT PPI for SUP  HEMATOLOGIC A:   Anemia of chronic illness P:  Trend CBC Transfuse for Hgb <7 Defer VTE till after cardiac catheterization  INFECTIOUS A:   No obvious source ? Infiltrate vs atelectasis per cxr 9/14 P:   Trend WBC Consider coverage for aspiration Culture as needed  ENDOCRINE A:   DM P:    CBG hourly SSI  NEUROLOGIC A:   Acute encephalopathy in the setting of cardiac arrest, concern for anoxia Nimbex in setting of hypothermia protocol( Re-warming 9/14@ 1500) CT Head results as above  P:  RASS goal: -5 TTM 33 degrees for neuro protection Fentanyl/ versed gtt Nimbex gtt and TOF monitoring per protocol EEG when able   FAMILY  - Updates: No family at bedside.   - Inter-disciplinary family meet or Palliative Care meeting due by:  06/18/17   Magdalen Spatz, AGACNP-BC [place] [phone] 06/12/2017 9:22 AM

## 2017-06-12 NOTE — Progress Notes (Signed)
Right Femoral sheath removed with pressure held x 30 minutes.  Right groin Level "0" s/p removal and manual hold.  Gerald Davenport assessed site s/p removal.  Gauze dressing applied at site.  Will continue to monitor for changes.  Bedrest x 4-6 hours. VSS post removal.

## 2017-06-12 NOTE — Progress Notes (Signed)
Inpatient Rehabilitation  Note change in medical status.  Plan to continue to follow at a distance for timing of medical readiness and therapy recommendations.  Please call with questions.  Carmelia Roller., CCC/SLP Admission Coordinator  [facility]  Cell [phone]

## 2017-06-12 NOTE — Progress Notes (Signed)
PULMONARY / CRITICAL CARE MEDICINE   Name: Gerald Davenport MRN: [id number] DOB: 1940/11/23    ADMISSION DATE:  06/26/2017   CONSULTATION DATE:  06/05/2017  REFERRING MD:  Dr. Cathlean Sauer  CHIEF COMPLAINT:  Cardiac Arrest  HISTORY OF PRESENT ILLNESS:  76 y.o. male with PMH of ESRD on MWF HD, chronic anemia, tobacco abuse/?COPD, CAD, HTN, HLD, DM, PVD with RBKA, PAD, and GERD admitted from home on 9/9 with chest pain and hypertensive urgency not related to volume overload. Nephrology and Cardiology consulted and following in hospitalization.  He had abnormal EKG, with ST depression on inferior-lateral leads, and mildly elevated troponin's thought related to demand ischemia in the setting of HTN urgency as well as ESRD.  He was being medically managed.  TTE on 9/11 was a technically difficult study but showed a normal EF at 55-60%, moderate LVH, and mild diastolic dysfunction.  Last dialyzed 4/09 without complications. On 9/13, patient was in his normal state of health and being evaluated to be discharged to inpatient rehab when he had syncopal episode with BP of 98/71.  Patient then became unresponsive, found to be bradycardic and then lost pulse.  PEA arrest for 4 mins prior to ROSC with epi given.  EKG with diffuse ST depression.  Patient remained unresponsive and was intubated for airway protection by anesthesia and moved to the ICU.  PCCM to admit.  Cardiology notified of acute changes and concern for EKG changes.  On arrival to ICU, patient suffered second PEA arrest, ROSC after 4 mins.  Treated with ACLS and presumed hyperkalemia.    SUBJECTIVE:  Started on therapeutic hypothermia protocol yesterday after brief PEA arrest. Started on dextrose infusion overnight for hypoglycemia.  REVIEW OF SYSTEMS:  Unable to assess with altered mental status, sedation, & intubation.   VITAL SIGNS: BP (!) 124/34   Pulse 68   Temp (!) 91.4 F (33 C)   Resp 18   Ht 5\' 8"  (1.727 m)   Wt 134 lb 15.1 oz (61.2 kg)    SpO2 100%   BMI 20.52 kg/m   HEMODYNAMICS: CVP:  [9 mmHg-12 mmHg] 9 mmHg  VENTILATOR SETTINGS: Vent Mode: PRVC FiO2 (%):  [40 %-100 %] 40 % Set Rate:  [18 bmp] 18 bmp Vt Set:  [580 mL] 580 mL PEEP:  [5 cmH20] 5 cmH20 Plateau Pressure:  [17 cmH20-21 cmH20] 20 cmH20  INTAKE / OUTPUT: I/O last 3 completed shifts: In: 1348.9 [P.O.:240; I.V.:1008.9; NG/GT:100] Out: 300 [Emesis/NG output:300]  PHYSICAL EXAMINATION: General: Sedated. No acute distress. No family at bedside.  Integument:  Cool and dry. No rash on exposed skin. Cooling pads in place. Extremities:  No cyanosis.  HEENT:  Scleral edema. Endotracheal tube in place. Moist mucous membranes. Cardiovascular:  Regular rate. Unable to appreciate JVD.  Pulmonary:  Distant breath sounds bilaterally. Symmetric chest wall rise on ventilator.. Abdomen: Soft. Nondistended. Hypoactive bowel sounds.  Neurological: Pupils pinpoint and symmetric. No spontaneous movements. Paralyzed and sedated.  LABS:  BMET  Recent Labs Lab 06/10/2017 1759  06/27/2017 2206 06/12/17 0209 06/12/17 0628  NA 133*  < > 136 131* 131*  K 4.7  < > 5.3* 5.1 5.2*  CL 100*  < > 99* 98* 98*  CO2 18*  --   --  19* 19*  BUN 64*  < > 68* 68* 72*  CREATININE 7.94*  < > 8.10* 8.03* 8.39*  GLUCOSE 101*  < > 59* 136* 103*  < > = values in this interval not displayed.  Electrolytes  Recent Labs Lab 06/09/17 0430  06/10/17 0342  06/14/2017 1307 06/15/2017 1759 06/12/17 0209 06/12/17 0628  CALCIUM 8.3*  < > 8.4*  < >  --  8.2* 7.4* 7.2*  MG  --   --   --   --  1.9  --   --   --   PHOS 5.9*  --  8.8*  --  10.6*  --   --   --   < > = values in this interval not displayed.  CBC  Recent Labs Lab 06/10/17 0342 06/06/2017 1210  06/15/2017 2003 06/21/2017 2206 06/12/17 0401  WBC 13.2* 14.9*  --   --   --  6.2  HGB 15.9 13.6  < > 12.2* 11.9* 10.6*  HCT 46.9 40.2  < > 36.0* 35.0* 30.9*  PLT 202 188  --   --   --  168  < > = values in this interval not  displayed.  Coag's  Recent Labs Lab 06/10/2017 1542 06/07/2017 2256 06/12/17 0401  APTT 162* 88* 70*  INR 5.37* 2.69 2.16    Sepsis Markers  Recent Labs Lab 05/31/2017 1308 06/10/2017 1614  LATICACIDVEN 7.0* 2.1*    ABG  Recent Labs Lab 06/07/2017 1227 06/21/2017 1251 05/31/2017 1615  PHART 7.387 7.402 7.467*  PCO2ART 26.8* 42.0 28.7*  PO2ART 462.0* 588.0* 334.0*    Liver Enzymes  Recent Labs Lab 06/28/2017 1014  06/09/17 1442 06/10/17 0342 06/02/2017 1303  AST 23  --  25  --  162*  ALT 12*  --  13*  --  106*  ALKPHOS 56  --  56  --  45  BILITOT 0.9  --  QUANTITY NOT SUFFICIENT, UNABLE TO PERFORM TEST  --  0.8  ALBUMIN 4.2  < > 4.0 3.9 2.5*  < > = values in this interval not displayed.  Cardiac Enzymes  Recent Labs Lab 06/12/2017 2107 06/12/17 0209 06/12/17 0628  TROPONINI 0.24* 0.44* 0.48*    Glucose  Recent Labs Lab 06/17/2017 2235 06/12/17 0019 06/12/17 0212 06/12/17 0409 06/12/17 0630 06/12/17 0830  GLUCAP 217* 169* 144* 125* 108* 82    Imaging Ct Head Wo Contrast  Result Date: 06/23/2017 CLINICAL DATA:  Altered level of consciousness. EXAM: CT HEAD WITHOUT CONTRAST TECHNIQUE: Contiguous axial images were obtained from the base of the skull through the vertex without intravenous contrast. COMPARISON:  09/08/2016 FINDINGS: Brain: No evidence of acute infarction, hemorrhage, hydrocephalus, extra-axial collection or mass lesion/mass effect. There is ventricular and sulcal enlargement reflecting mild generalized atrophy. Patchy areas of white matter hypoattenuation noted consistent with mild chronic microvascular ischemic change. 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 These findings are stable. Vascular: No hyperdense vessel or unexpected calcification. Skull: Normal. Negative for fracture or focal lesion. Sinuses/Orbits: Globes and orbits are unremarkable. Sinuses and mastoid air cells are clear. Other:  None. IMPRESSION: 1. No acute intracranial abnormalities. 2. Mild generalized atrophy and chronic microvascular ischemic change. Electronically Signed   By: Lajean Manes M.D.   On: 06/05/2017 19:27   Dg Chest Port 1 View  Result Date: 06/12/2017 CLINICAL DATA:  Intubation.  Respiratory failure . EXAM: PORTABLE CHEST 1 VIEW COMPARISON:  06/02/2017.  06/08/2017. FINDINGS: Endotracheal tube, NG tube, dual-lumen right IJ catheter, left IJ catheter in stable position. Right axillary vascular stents noted. Heart size stable. Mild right infrahilar atelectasis/ infiltrate cannot be excluded. No pleural effusion or pneumothorax. IMPRESSION: 1. Lines and tubes  in stable position. 2. Mild right infrahilar atelectasis/ infiltrate cannot be excluded . Electronically Signed   By: Marcello Moores  Register   On: 06/12/2017 07:40   Dg Chest Port 1 View  Result Date: 06/05/2017 CLINICAL DATA:  Acute respiratory failure. EXAM: PORTABLE CHEST 1 VIEW COMPARISON:  June 07, 2017. FINDINGS: The cerclage are pad over the central chest. Interval insertion of an endotracheal tube with the tip in good position approximately 3.8 cm above the carina. An enteric tube is noted entering the stomach, with the distal side port just beyond the gastroesophageal junction. New left internal jugular central venous catheter with the tip projecting over the mid SVC. Unchanged tunneled right internal jugular dialysis catheter with the tip at the cavoatrial junction. The cardiomediastinal silhouette is normal in size. Normal pulmonary vascularity. No focal consolidation, pleural effusion, or pneumothorax. No acute osseous abnormality. Unchanged right axillary stent. IMPRESSION: 1. Interval placement of endotracheal and enteric tubes, as well as a left internal jugular central venous catheter, which are in appropriate position. 2. No active cardiopulmonary disease.  No pneumothorax. Electronically Signed   By: Titus Dubin M.D.   On: 06/19/2017 16:07     STUDIES:  TTE 9/11:  Technically difficult study. Moderate LVH with EF 55-60%. No regional wall motion abnormalities. Grade 1 diastolic dysfunction. LA & RA normal in size. RV normal in size and function. Aortic valve poorly visualized but without stenosis or regurgitation. Aortic root normal in size. No mitral stenosis or regurgitation. No pulmonic stenosis. No tricuspid regurgitation. No pericardial effusion. LHC 9/13:  Ost 3rd Mrg lesion, 50 %stenosed.  Ost RCA lesion, 99 %stenosed.  Post intervention, there is a 20% residual stenosis.  A stent was successfully placed. EEG 9/13:  This EEG is abnormal and findings are consistent with nonspecific generalized cerebral dysfunction, epileptiform features were not seen during this recording. CT HEAD W/O 9/13:  No acute intracranial abnormalities. Mild generalized atrophy and chronic microvascular ischemic change. PORT CXR 9/14:  Personally reviewed by me. Unable to visualize the complete costophrenic angles. No focal opacity or pleural effusion appreciated otherwise.  Right tunneled IJ catheter noted. Left internal jugular central venous catheter in good position. Endotracheal tube in good position. Enteric feeding tube coursing below diaphragm.   MICROBIOLOGY: MRSA PCR 9/9:  Negative Blood Cultures x2 9/11 >>>  ANTIBIOTICS: None.   SIGNIFICANT EVENTS: 9/09 - Admit with CP and HTN urgency 9/13 - Brief PEA arrest x2 >> LHC w/ RCA stent placement. Started therapeutic hypothermia.   9/14 - Hypoglycemic overnight >> started on D5 1/2 NS. Art line kinked & inaccurate pressure overnight for unclear length of time.  LINES/TUBES: OETT 9/13 >>> Tunneled R HD Catheter >>> R Fem Art Sheath 9/13 >>> L Fem Art Line 9/13 >>> L Fem CVL 9/13 >>> L IJ CVL 9/13 >>> OGT 9/13 >>> PIV  ASSESSMENT / PLAN:  PULMONARY A: Acute hypoxic respiratory failure: Status post arrest. Presumed COPD: No history of pulmonary function testing. On chronic  breathing treatments at home. Tobacco use disorder History of positive  P:   Full ventilator support while paralyzed Plan to start spontaneous breathing trial & pressure-support weaning after rewarming ABG and portable chest x-ray Duonebs when necessary  CARDIOVASCULAR A:  S/P PE arrest: Occurred twice with total of 8 minutes before return of spontaneous circulation. Coronary artery disease: Status post revascularization of RCA on 9/13. Prolonged QTc:  456ms previously. Hx HTN, HLD, PAD, & PVD:  History of arterial clot right leg.  P:  Management per cardiology Continuous telemetry monitoring Weaning vasopressors for MAP >60 Vitals per unit protocol Continuing aspirin, Zocor, and Brilinta Seroquel stopped previously  RENAL A:   ESRD on hemodialysis Monday, Wednesday, Friday. Hyperkalemia: Mild.  P:   Nephrology following Hemodialysis today per nephrology Monitoring electrolytes daily and as per protocol   GASTROINTESTINAL A:   History of GERD History of diverticulosis  P:   Nothing by mouth Hepatic function panel stat  HEMATOLOGIC/ONCOLOGIC A:   Anemia:  Secondary to chronic illness. No signs of active bleeding. Coagulopathy: Likely multifactorial. History of Prostate cancer  P:  Trending cell counts daily with CBC Transfuse for hemoglobin less than 8.0  Trending INR daily  INFECTIOUS A:   No obvious source P:   Trend WBC monitor  ENDOCRINE A: Diabetes mellitus type 2: Hypoglycemia overnight. History of hyperparathyroidism  P:   Continuing calcitriol Continuing D5 half-normal saline at 50 mL/h Accu-Cheks every hour per protocol  NEUROLOGIC A:   Acute encephalopathy: Suspect secondary to anoxia post cardiac arrest. Sedation on ventilator/while paralyzed History of anxiety  P:   RASS goal: -5 while paralyzed Therapeutic hypothermia Nimbex gtt Fentanyl & Versed drips & IV push  Prophylaxis:  SCDs, Protonix IV daily, & starting Heparin  [place] q8hr.  Diet:  NPO. Holding on tube feedings.  Code Status:  Full Code per previous physician discussions. Disposition:  Remains critically ill in the ICU. Family Update: No family at bedside at the time of my rounds.  DISCUSSION:  76 y.o. male with end-stage renal disease and peripheral arterial disease. Status post PEA arrest with altered mentation. Currently on therapeutic hypothermia protocol. Patient with hypotension for unclear time overnight due to bend in tubing. Further titrating vasopressors. Continuing dextrose infusion for hypoglycemia. Awaiting rewarming to prognosticate neurologic recovery.  I have personally spent a total of 33 minutes of critical care time today caring for the patient & reviewing the patient's electronic medical record.  Sonia Baller Ashok Cordia, M.D. [facility] Pager:  [phone] After 3pm or if no response, call [phone] 06/12/2017 9:46 AM

## 2017-06-12 NOTE — Care Management Note (Signed)
Case Management Note  Patient Details  Name: OFFIE WAIDE MRN: [id number] Date of Birth: Oct 23, 1940  Subjective/Objective:  Patient from home with wife. Has BKA and uses prosthetic. PT recommending SNF. Patient was refusing SNF, CIR was following patient for potential admit to CIR.  On 9/13 was being evaluated to be dc to CIR when he had syncopal episode, became unresponsive, was bradycardic and then had PEA arrest, was intubated, on way to ICU had 2nd PEA arrest.  On 9/13 s/p stent intervention, will be on brilinta.  NCM awaiting benefit check .  Per CIR rep will follow at a distance , since change in medical statux.                   Action/Plan: NCM will follow for dc needs.   Expected Discharge Date:  06/10/17               Expected Discharge Plan:  IP Rehab Facility  In-House Referral:  Clinical Social Work  Discharge planning Services  CM Consult  Post Acute Care Choice:    Choice offered to:     DME Arranged:    DME Agency:     HH Arranged:    HH Agency:     Status of Service:  In process, will continue to follow  If discussed at Long Length of Stay Meetings, dates discussed:    Additional Comments:  Zenon Mayo, RN 06/12/2017, 2:15 PM

## 2017-06-12 NOTE — Progress Notes (Signed)
Initial Nutrition Assessment  DOCUMENTATION CODES:   Non-severe (moderate) malnutrition in context of chronic illness  INTERVENTION:   Once rewarmed recommend:   Nepro @ 25 ml/hr (600 ml/day) 30 ml Prostat five times per day  Provides: 1580 kcal, 123 grams protein, and 436 ml free water.    NUTRITION DIAGNOSIS:   Malnutrition (Moderate) related to chronic illness (ESRD) as evidenced by moderate depletions of muscle mass, moderate depletion of body fat.  GOAL:   Patient will meet greater than or equal to 90% of their needs  MONITOR:   I & O's, Vent status  REASON FOR ASSESSMENT:   Ventilator    ASSESSMENT:    Pt with PMH of ESRD on HD MWF, chronic anemia, COPD, CAD, HTN, HLD, DM, PVD with R BKA, PAD, and GERD admitted 9/9 with chest pain, elevated troponin's thought to be demand ischemia in the setting of HTN urgency as well as ESRD. 9/13 pt was being considered for transfer to CIR when he had PEA arrest x 2. Pt started on arctic sun protocol and is currently started to rewarm.   Last few days meal completion was variable at 0-75%  Admission weight 57.2 kg now up to 61.2 kg with + fluid balance Pt with tunneled R HD catheter. Pt started on CRRT today.  Spoke with RN at bedside who states that family reports that he was not eating much the last few days and not feeling well. On admission weight was 126 lb, per chart review pt was 152 lb 1/18 and 155 lb 4/18. It would appear that pt has had a significant weight loss, no family present to confirm. It would appear that weight loss started in April of this year.   Nutrition-Focused physical exam completed. Findings are mild/moderate fat depletion, mild/moderate muscle depletion, and no edema. Noted dry skin and two areas that appear to be healed pressure ulcers (heel of L foot and R finger)   Patient is currently intubated on ventilator support MV: 10.5 L/min Temp (24hrs), Avg:91.2 F (32.9 C), Min:90.3 F (32.4 C),  Max:91.8 F (33.2 C)  Medications reviewed and include: rena-vit, levophed D5 1/2 NS @ 50 ml/hr Labs reviewed: Na 134 (L), K+ WNL, PO4 10.6 (H) 9/13   Diet Order:  Diet - low sodium heart healthy Diet NPO time specified  Skin:  Reviewed, no issues  Last BM:  9/13  Height:   Ht Readings from Last 1 Encounters:  06/06/2017 5\' 8"  (1.727 m)    Weight:   Wt Readings from Last 1 Encounters:  06/12/17 134 lb 15.1 oz (61.2 kg)    Ideal Body Weight:  68 kg  BMI:  Body mass index is 20.52 kg/m.  Estimated Nutritional Needs:   Kcal:  3664  Protein:  115-130 grams  Fluid:  > 1.5 L/day  EDUCATION NEEDS:   No education needs identified at this time  [place], [place], [place] Pager [phone] After Hours Pager

## 2017-06-13 ENCOUNTER — Inpatient Hospital Stay (HOSPITAL_COMMUNITY): Payer: Medicare Other

## 2017-06-13 DIAGNOSIS — R402434 Glasgow coma scale score 3-8, 24 hours or more after hospital admission: Secondary | ICD-10-CM

## 2017-06-13 DIAGNOSIS — G931 Anoxic brain damage, not elsewhere classified: Secondary | ICD-10-CM

## 2017-06-13 LAB — RENAL FUNCTION PANEL
Albumin: 2 g/dL — ABNORMAL LOW (ref 3.5–5.0)
Anion gap: 12 (ref 5–15)
BUN: 35 mg/dL — AB (ref 6–20)
CHLORIDE: 103 mmol/L (ref 101–111)
CO2: 19 mmol/L — ABNORMAL LOW (ref 22–32)
CREATININE: 4.17 mg/dL — AB (ref 0.61–1.24)
Calcium: 7.2 mg/dL — ABNORMAL LOW (ref 8.9–10.3)
GFR calc Af Amer: 14 mL/min — ABNORMAL LOW (ref >60)
GFR, EST NON AFRICAN AMERICAN: 12 mL/min — AB (ref >60)
GLUCOSE: 85 mg/dL (ref 65–99)
POTASSIUM: 5.5 mmol/L — AB (ref 3.5–5.1)
Phosphorus: 5.2 mg/dL — ABNORMAL HIGH (ref 2.5–4.6)
Sodium: 134 mmol/L — ABNORMAL LOW (ref 135–145)

## 2017-06-13 LAB — BASIC METABOLIC PANEL
Anion gap: 13 (ref 5–15)
BUN: 35 mg/dL — AB (ref 6–20)
CHLORIDE: 103 mmol/L (ref 101–111)
CO2: 18 mmol/L — AB (ref 22–32)
CREATININE: 4.36 mg/dL — AB (ref 0.61–1.24)
Calcium: 6.9 mg/dL — ABNORMAL LOW (ref 8.9–10.3)
GFR calc Af Amer: 14 mL/min — ABNORMAL LOW (ref >60)
GFR calc non Af Amer: 12 mL/min — ABNORMAL LOW (ref >60)
Glucose, Bld: 81 mg/dL (ref 65–99)
POTASSIUM: 5.4 mmol/L — AB (ref 3.5–5.1)
Sodium: 134 mmol/L — ABNORMAL LOW (ref 135–145)

## 2017-06-13 LAB — POCT I-STAT 3, ART BLOOD GAS (G3+)
Acid-base deficit: 4 mmol/L — ABNORMAL HIGH (ref 0.0–2.0)
BICARBONATE: 21.5 mmol/L (ref 20.0–28.0)
O2 SAT: 99 %
TCO2: 23 mmol/L (ref 22–32)
pCO2 arterial: 35.7 mmHg (ref 32.0–48.0)
pH, Arterial: 7.381 (ref 7.350–7.450)
pO2, Arterial: 155 mmHg — ABNORMAL HIGH (ref 83.0–108.0)

## 2017-06-13 LAB — COMPREHENSIVE METABOLIC PANEL
ALBUMIN: 2 g/dL — AB (ref 3.5–5.0)
ALT: 119 U/L — ABNORMAL HIGH (ref 17–63)
ANION GAP: 12 (ref 5–15)
AST: 130 U/L — ABNORMAL HIGH (ref 15–41)
Alkaline Phosphatase: 48 U/L (ref 38–126)
BUN: 36 mg/dL — ABNORMAL HIGH (ref 6–20)
CALCIUM: 7.1 mg/dL — AB (ref 8.9–10.3)
CO2: 18 mmol/L — AB (ref 22–32)
Chloride: 103 mmol/L (ref 101–111)
Creatinine, Ser: 4.22 mg/dL — ABNORMAL HIGH (ref 0.61–1.24)
GFR calc Af Amer: 14 mL/min — ABNORMAL LOW (ref >60)
GFR calc non Af Amer: 12 mL/min — ABNORMAL LOW (ref >60)
Glucose, Bld: 83 mg/dL (ref 65–99)
POTASSIUM: 5.5 mmol/L — AB (ref 3.5–5.1)
SODIUM: 133 mmol/L — AB (ref 135–145)
TOTAL PROTEIN: 5.4 g/dL — AB (ref 6.5–8.1)
Total Bilirubin: 0.7 mg/dL (ref 0.3–1.2)

## 2017-06-13 LAB — CBC
HCT: 27.6 % — ABNORMAL LOW (ref 39.0–52.0)
Hemoglobin: 9.2 g/dL — ABNORMAL LOW (ref 13.0–17.0)
MCH: 34.1 pg — ABNORMAL HIGH (ref 26.0–34.0)
MCHC: 33.3 g/dL (ref 30.0–36.0)
MCV: 102.2 fL — ABNORMAL HIGH (ref 78.0–100.0)
PLATELETS: 174 10*3/uL (ref 150–400)
RBC: 2.7 MIL/uL — ABNORMAL LOW (ref 4.22–5.81)
RDW: 17.3 % — AB (ref 11.5–15.5)
WBC: 10.3 10*3/uL (ref 4.0–10.5)

## 2017-06-13 LAB — GLUCOSE, CAPILLARY
GLUCOSE-CAPILLARY: 130 mg/dL — AB (ref 65–99)
GLUCOSE-CAPILLARY: 95 mg/dL (ref 65–99)
Glucose-Capillary: 98 mg/dL (ref 65–99)

## 2017-06-13 LAB — TROPONIN I: TROPONIN I: 0.44 ng/mL — AB (ref <0.03)

## 2017-06-13 LAB — MAGNESIUM: MAGNESIUM: 2 mg/dL (ref 1.7–2.4)

## 2017-06-13 LAB — APTT: aPTT: 72 seconds — ABNORMAL HIGH (ref 24–36)

## 2017-06-13 LAB — LACTIC ACID, PLASMA: Lactic Acid, Venous: 4.5 mmol/L (ref 0.5–1.9)

## 2017-06-13 MED ORDER — SODIUM CHLORIDE 0.9 % IV SOLN
0.0300 [IU]/min | INTRAVENOUS | Status: DC
  Administered 2017-06-13: 0.03 [IU]/min via INTRAVENOUS
  Filled 2017-06-13 (×2): qty 2

## 2017-06-14 LAB — CULTURE, BLOOD (ROUTINE X 2)
CULTURE: NO GROWTH
Culture: NO GROWTH
SPECIAL REQUESTS: ADEQUATE
Special Requests: ADEQUATE

## 2017-06-16 ENCOUNTER — Telehealth: Payer: Self-pay | Admitting: Gastroenterology

## 2017-06-16 ENCOUNTER — Telehealth: Payer: Self-pay

## 2017-06-16 NOTE — Telephone Encounter (Signed)
Noted  

## 2017-06-16 NOTE — Telephone Encounter (Signed)
Patient is deceased and I have mailed the family a sympathy card.

## 2017-06-16 NOTE — Telephone Encounter (Signed)
On 06/16/2017 I received a death certificate from [facility] (orignal). The death certificate is for cremation. The patient is a patient of Doctor Maneem. The death certificate will be taken to Pulmonary Unit @ [facility] this pm for signature.  On 07-11-2017 I received the death certificate back from Doctor Maneem. I got the death certificate ready and called the funeral home to let them know the d/c is ready for pickup.

## 2017-06-18 NOTE — Telephone Encounter (Signed)
PT IS AN [facility] PT. 

## 2017-06-18 NOTE — Telephone Encounter (Signed)
Communication noted. Sorry to hear that.

## 2017-06-24 ENCOUNTER — Encounter (HOSPITAL_COMMUNITY): Payer: Medicare Other

## 2017-06-26 ENCOUNTER — Encounter (HOSPITAL_COMMUNITY): Payer: Medicare Other

## 2017-06-26 ENCOUNTER — Ambulatory Visit: Payer: Medicare Other | Admitting: Vascular Surgery

## 2017-06-29 NOTE — Consult Note (Addendum)
NEURO HOSPITALIST CONSULT NOTE   Requestig physician: Dr. Titus Mould  Reason for Consult: Coma status post cardiac arrest  History obtained from:  Chart   HPI:                                                                                                                                          Gerald Davenport is an 76 y.o. male with HTN, HLD, DM, ESRD on HD, who initially presented to the hospital with chest pain. He was ruled out for MI and was being evaluated for inpatient rehabilitation when on 9/13 he developed depisodes of hypotension and syncope. Shortly afterwards, he suffered cardiac arrest with ROSC after 8 minutes. He remained encephalopathic and was intubated for airway protection. Hypothermia protocol was initiated. He was subsequently rewarmed and taken off all sedation but has not regained consciousness. CT head today showed new finding relative to 9/13 CT of diffuse hypodensity within the posterior cerebral hemispheres bilaterally, concerning for diffuse anoxic brain injury. Neurology was consulted for prognosis.   Past Medical History:  Diagnosis Date  . Anemia   . Anxiety   . Cancer [facility])    Prostate cancer treated with radiation therapy  . Chronic kidney disease    dialysis Mon, Wednes, Fri  . Clot 05/2006   Arterial clot right leg   . Constipation   . Diabetes mellitus    Type II  . Diverticulosis   . GERD (gastroesophageal reflux disease)   . Hemodialysis patient ([place]) 6/13  . Hyperlipidemia   . Hypertension   . Infection    drainage from distal incision of left upper arm arteriovenous graft  . Positive PPD   . Shortness of breath dyspnea    with exertion, when he has too much fluid  . 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 Thyroid disease    hyperparathyroidism  . Wears glasses     Past Surgical History:  Procedure Laterality Date  . ABDOMINAL AORTOGRAM W/LOWER EXTREMITY N/A 02/12/2017   Procedure: Abdominal Aortogram w/Lower  Extremity;  Surgeon: Conrad Walsenburg, MD;  Location: [place] [facility];  Service: Cardiovascular;  Laterality: N/A;  . AV FISTULA PLACEMENT  11/04/2010   left and left  . AV FISTULA PLACEMENT  11/20/2011   Procedure: INSERTION OF ARTERIOVENOUS (AV) GORE-TEX GRAFT ARM;  Surgeon: Angelia Mould, MD;  Location: [place];  Service: Vascular;  Laterality: Right;  . AV FISTULA PLACEMENT Right 04/19/2015   Procedure: Excision of infected RUA AVG;  Surgeon: Conrad Mexia, MD;  Location: [place];  Service: Vascular;  Laterality: Right;  . AV FISTULA PLACEMENT Left 10/04/2015   Procedure: INSERTION OF ARTERIOVENOUS (AV) GORE-TEX GRAFT ARM;  Surgeon: Conrad Shamrock, MD;  Location: [place];  Service: Vascular;  Laterality: Left;  . [place] REMOVAL Left 10/13/2015   Procedure: REMOVAL OF INFECTED ARTERIOVENOUS GORETEX LEFT GRAFT ;  Surgeon: Conrad Goshen, MD;  Location: [place];  Service: Vascular;  Laterality: Left;  . BASCILIC VEIN TRANSPOSITION Left 06/19/2015   Procedure: FIRST STAGE BRACHIAL VEIN TRANSPOSITION;  Surgeon: Conrad [place], MD;  Location: [place];  Service: Vascular;  Laterality: Left;  . 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 COLONOSCOPY N/A 11/10/2012   QIH:[id number] proctitis/Pancolonic diverticulosis/(TUBULAR ADENOMA)5 mm polyp in the transverse segment/4 mm polyps in the base of the cecum and diminutive polyp in the descending segment. INADEQUATE BOWEL PREP  . COLONOSCOPY N/A 11/28/2013   RMR: Radiation proctitis. Colonic diverticulosis. Colonic polyps-removed as described above.   . CORONARY STENT INTERVENTION N/A 06/15/2017   Procedure: CORONARY STENT INTERVENTION;  Surgeon: Lorretta Harp, MD;  Location: [place] [facility];  Service: Cardiovascular;  Laterality: N/A;  ostial rca   . INSERTION OF DIALYSIS CATHETER Right 01/19/2016   Procedure: INSERTION OF Right internal jugular  DIALYSIS CATHETER.;  Surgeon: Elam Dutch, MD;  Location: [place];  Service: Vascular;  Laterality: Right;  .  LEFT HEART CATH AND CORONARY ANGIOGRAPHY N/A 06/12/2017   Procedure: LEFT HEART CATH AND CORONARY ANGIOGRAPHY;  Surgeon: Lorretta Harp, MD;  Location: [place] [facility];  Service: Cardiovascular;  Laterality: N/A;  . PERIPHERAL VASCULAR BALLOON ANGIOPLASTY Left 02/12/2017   Procedure: Peripheral Vascular Balloon Angioplasty;  Surgeon: Conrad [place], MD;  Location: [place] [facility];  Service: Cardiovascular;  Laterality: Left;  SFA  . PERIPHERAL VASCULAR CATHETERIZATION Bilateral 12/06/2015   Procedure: Upper Extremity Venography/ central venogram;  Surgeon: Conrad Altus, MD;  Location: [place] [facility];  Service: Cardiovascular;  Laterality: Bilateral;  . PERIPHERAL VASCULAR CATHETERIZATION N/A 05/29/2016   Procedure: Upper Extremity Angiography;  Surgeon: Conrad Corona, MD;  Location: [place] [facility];  Service: Cardiovascular;  Laterality: N/A;  . PERIPHERAL VASCULAR CATHETERIZATION N/A 05/29/2016   Procedure: Lower Extremity Angiography;  Surgeon: Conrad St. Michaels, MD;  Location: [place] [facility];  Service: Cardiovascular;  Laterality: N/A;  . PERIPHERAL VASCULAR CATHETERIZATION  05/29/2016   Procedure: Peripheral Vascular Intervention;  Surgeon: Conrad Metompkin, MD;  Location: [place] [facility];  Service: Cardiovascular;;  . REMOVAL OF A HERO DEVICE Right 01/19/2016   Procedure: REMOVAL OF A HERO DEVICE;  Surgeon: Elam Dutch, MD;  Location: [place];  Service: Vascular;  Laterality: Right;  . SHUNTOGRAM N/A 10/08/2012   Procedure: [name];  Surgeon: Elam Dutch, MD;  Location: [facility] C[facility];  Service: Cardiovascular;  Laterality: N/A;  . VASCULAR ACCESS DEVICE INSERTION Right 01/01/2016   Procedure: INSERTION OF HERO VASCULAR ACCESS DEVICE - RIGHT;  Surgeon: Conrad Cal[place], MD;  Location: [place];  Service: Vascular;  Laterality: Right;    Family History  Problem Relation Age of Onset  . Heart disease Mother   . Diabetes Mother   . Hypertension Mother   . Stroke Mother   . Heart disease  Father   . Diabetes Father   . Heart disease Sister   . Diabetes Brother   . Colon cancer Neg Hx    Social History:  reports that he has been smoking Cigarettes.  He has a 12.50 pack-year smoking history. He has never used smokeless tobacco. He reports that he does not drink alcohol or use drugs.  No Known Allergies  MEDICATIONS:  Scheduled: . aspirin  81 mg Per Tube Daily  . calcitRIOL  0.75 mcg Per Tube Q M,W,F-HD  . chlorhexidine gluconate (MEDLINE KIT)  15 mL Mouth Rinse BID  . Chlorhexidine Gluconate Cloth  6 each Topical Daily  . fluorometholone  1 drop Both Eyes QHS  . heparin subcutaneous  5,000 Units Subcutaneous Q8H  . mouth rinse  15 mL Mouth Rinse 10 times per day  . multivitamin  1 tablet Oral QHS  . pantoprazole (PROTONIX) IV  40 mg Intravenous Daily  . simvastatin  20 mg Per Tube QHS  . sodium chloride flush  3 mL Intravenous Q12H  . ticagrelor  90 mg Per Tube Q12H   Continuous: . sodium chloride    . dextrose 5 % and 0.45% NaCl 50 mL/hr at 07-13-2017 0700  . ferric gluconate (FERRLECIT/NULECIT) IV 62.5 mg (06/10/17 1718)  . norepinephrine (LEVOPHED) Adult infusion 40 mcg/min (2017-07-13 1345)  . dialysis replacement fluid (prismasate) 400 mL/hr at 07-13-2017 0200  . dialysis replacement fluid (prismasate) 200 mL/hr at 06/12/17 1254  . dialysate (PRISMASATE) 1,800 mL/hr at Jul 13, 2017 1332  . sodium chloride    . vasopressin (PITRESSIN) infusion - *FOR SHOCK* Stopped (2017/07/13 1254)     ROS:                                                                                                                                       Unable to obtain due to coma.  Blood pressure (!) 182/39, pulse 80, temperature (!) 94.8 F (34.9 C), resp. rate 19, height '5\' 8"'$  (1.727 m), weight 60.5 kg (133 lb 6.1 oz), SpO2 98 %.  General Examination:                                                                                                       General: Frail appearing. Warming apparatus to legs bilaterally HEENT-  [place]/AT. No nuchal rigidity Lungs- Intubated.  Extremities- Right BKA.  Neurological Examination Mental Status: Off all sedation. Comatose. Not opening eyes to any stimulus. No motor or ocular response to any stimulus. No attempts to communicate.  Cranial Nerves: II: Pupils 2 mm and unreactive. No blink to threat.   III,IV, VI: No spontaneous EOM. Absent doll's eye reflex.  V,VII: Face flaccidly symmetric. No facial movement to noxious.  Trace corneal reflexes bilaterally.  VIII: No response to any auditory stimulus IX,X: No gag or cough reflex with suctioning.  XI: No shoulder movement or rotation of head.  XII: Intubated Motor/Sensory: Flaccid extremities x 4 except for increased hip and knee extensor tone to LLE. No movement to noxious. Right BKA noted.  Deep Tendon Reflexes: Trace left brachioradialils, 2+ right brachioradialis. Absent biceps reflexes. Absent patellar reflexes bilaterally. Absent left achilles reflex.  Plantars: Right: BKA   Left: Mute Cerebellar/Gait: Unable to assess  Lab Results: Basic Metabolic Panel:  Recent Labs Lab 06/09/17 0430  06/10/17 0342  06/27/2017 1307  06/12/17 1615 06/12/17 1752 06/12/17 2140 07/07/2017 0251 07-Jul-2017 0257 2017/07/07 0645  NA 133*  < > 130*  < >  --   < > 133* 133* 133* 134* 133* 134*  K 4.6  < > 5.7*  < >  --   < > 4.4 4.6 4.6 5.5* 5.5* 5.4*  CL 92*  < > 90*  < >  --   < > 101 103 101 103 103 103  CO2 24  < > 19*  < >  --   < > 18* 19* 20* 19* 18* 18*  GLUCOSE 63*  < > 110*  < >  --   < > 92 97 96 85 83 81  BUN 38*  < > 65*  < >  --   < > 59* 52* 44* 35* 36* 35*  CREATININE 8.49*  < > 11.03*  < >  --   < > 6.67* 5.99* 5.25* 4.17* 4.22* 4.36*  CALCIUM 8.3*  < > 8.4*  < >  --   < > 7.1* 7.2* 7.2* 7.2* 7.1* 6.9*  MG  --   --   --   --  1.9  --   --   --   --   --  2.0  --   PHOS 5.9*  --   8.8*  --  10.6*  --  6.3*  --   --  5.2*  --   --   < > = values in this interval not displayed.  Liver Function Tests:  Recent Labs Lab 06/08/2017 1014  06/09/17 1442  06/15/2017 1303 06/12/17 1019 06/12/17 1615 07-07-2017 0251 07-07-17 0257  AST 23  --  25  --  162* 118*  --   --  130*  ALT 12*  --  13*  --  106* 109*  --   --  119*  ALKPHOS 56  --  56  --  45 37*  --   --  48  BILITOT 0.9  --  QUANTITY NOT SUFFICIENT, UNABLE TO PERFORM TEST  --  0.8 0.4  --   --  0.7  PROT 9.3*  --  8.8*  --  5.8* 5.2*  --   --  5.4*  ALBUMIN 4.2  < > 4.0  < > 2.5* 2.2* 2.2* 2.0* 2.0*  < > = values in this interval not displayed.  Recent Labs Lab 06/06/2017 1014  LIPASE 47   No results for input(s): AMMONIA in the last 168 hours.  CBC:  Recent Labs Lab 05/30/2017 1014  06/09/17 1442 06/10/17 0342 06/18/2017 1210  06/07/2017 1525 06/09/2017 2003 06/19/2017 2206 06/12/17 0401 07-07-2017 0257  WBC 13.9*  --  13.6* 13.2* 14.9*  --   --   --   --  6.2 10.3  NEUTROABS 12.2*  --  11.1* 10.6* 11.8*  --   --   --   --   --   --   HGB 15.4  < > 15.0 15.9 13.6  < > 10.2* 12.2* 11.9* 10.6*  9.2*  HCT 45.9  < > 44.1 46.9 40.2  < > 30.0* 36.0* 35.0* 30.9* 27.6*  MCV 106.7*  --  106.5* 104.2* 103.9*  --   --   --   --  101.6* 102.2*  PLT 235  --  197 202 188  --   --   --   --  168 174  < > = values in this interval not displayed.  Cardiac Enzymes:  Recent Labs Lab 06/24/2017 1303 06/17/2017 2107 06/12/17 0209 06/12/17 0628 Jul 07, 2017 0257  TROPONINI 0.04* 0.24* 0.44* 0.48* 0.44*    Lipid Panel:  Recent Labs Lab 06/09/17 0430 06/16/2017 1210  CHOL 171  --   TRIG 174* 60  HDL 72  --   CHOLHDL 2.4  --   VLDL 35  --   LDLCALC 64  --     CBG:  Recent Labs Lab 06/12/17 1605 06/12/17 1800 06/12/17 1956 July 07, 2017 0018 Jul 07, 2017 0403  GLUCAP 91 99 97 95 98    Microbiology: Results for orders placed or performed during the hospital encounter of 06/18/2017  MRSA PCR Screening     Status: None    Collection Time: 06/16/2017  6:55 PM  Result Value Ref Range Status   MRSA by PCR NEGATIVE NEGATIVE Final    Comment:        The GeneXpert MRSA Assay (FDA approved for NASAL specimens only), is one component of a comprehensive MRSA colonization surveillance program. It is not intended to diagnose MRSA infection nor to guide or monitor treatment for MRSA infections.   Culture, blood (Routine X 2) w Reflex to ID Panel     Status: None (Preliminary result)   Collection Time: 06/09/17  2:42 PM  Result Value Ref Range Status   Specimen Description BLOOD LEFT ARM  Final   Special Requests IN PEDIATRIC BOTTLE Blood Culture adequate volume  Final   Culture NO GROWTH 3 DAYS  Final   Report Status PENDING  Incomplete  Culture, blood (Routine X 2) w Reflex to ID Panel     Status: None (Preliminary result)   Collection Time: 06/09/17  2:42 PM  Result Value Ref Range Status   Specimen Description BLOOD RIGHT ARM  Final   Special Requests IN PEDIATRIC BOTTLE Blood Culture adequate volume  Final   Culture NO GROWTH 3 DAYS  Final   Report Status PENDING  Incomplete    Coagulation Studies:  Recent Labs  06/02/2017 1303 06/12/2017 1542 06/23/2017 2256 06/12/17 0401  LABPROT 17.9* 48.7* 28.4* 23.9*  INR 1.49 5.37* 2.69 2.16    Imaging: Ct Head Wo Contrast  Result Date: 07/07/2017 CLINICAL DATA:  Cardiac arrest with cooling treatment. Mental status remains depressed. EXAM: CT HEAD WITHOUT CONTRAST TECHNIQUE: Contiguous axial images were obtained from the base of the skull through the vertex without intravenous contrast. COMPARISON:  06/16/2017.  09/08/2016. FINDINGS: Brain: Re- demonstration of chronic brain atrophy. 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 Chronic small-vessel changes of the white matter. One could question brain swelling in the PCA territories bilaterally affecting the posterior temporal and occipital regions. No evidence of hemorrhage, hydrocephalus or extra-axial  collection. Vascular: There is atherosclerotic calcification of the major vessels at the base of the brain. Skull: Negative Sinuses/Orbits: Clear/normal Other: None IMPRESSION: Question of newly seen low-density in the PCA territories bilaterally, affecting the posteromedial temporal lobes and occipital lobes. This could be secondary to brain swelling following ischemic insult. I do not think finding is definite however. Chronic atrophy, small  vessel disease and old right frontal infarction. Electronically Signed   By: Nelson Chimes M.D.   On: Jul 09, 2017 07:22   Ct Head Wo Contrast  Result Date: 06/19/2017 CLINICAL DATA:  Altered level of consciousness. EXAM: CT HEAD WITHOUT CONTRAST TECHNIQUE: Contiguous axial images were obtained from the base of the skull through the vertex without intravenous contrast. COMPARISON:  09/08/2016 FINDINGS: Brain: No evidence of acute infarction, hemorrhage, hydrocephalus, extra-axial collection or mass lesion/mass effect. There is ventricular and sulcal enlargement reflecting mild generalized atrophy. Patchy areas of white matter hypoattenuation noted consistent with mild chronic microvascular ischemic change. 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 These findings are stable. Vascular: No hyperdense vessel or unexpected calcification. Skull: Normal. Negative for fracture or focal lesion. Sinuses/Orbits: Globes and orbits are unremarkable. Sinuses and mastoid air cells are clear. Other: None. IMPRESSION: 1. No acute intracranial abnormalities. 2. Mild generalized atrophy and chronic microvascular ischemic change. Electronically Signed   By: Lajean Manes M.D.   On: 06/14/2017 19:27   Dg Chest Port 1 View  Result Date: 2017/07/09 CLINICAL DATA:  Followup respiratory failure EXAM: PORTABLE CHEST 1 VIEW COMPARISON:  06/12/2017 FINDINGS: Endotracheal tube tip is 4 cm above the carina. Nasogastric tube enters the stomach.  Right-sided central line tip in the SVC above the right atrium. Heart size is normal. There is aortic atherosclerosis. Lungs are clear except for minimal atelectasis or scar in the right lower lobe. No edema. IMPRESSION: Lines and tubes well positioned. Lungs clear except for minimal atelectasis or scar at the right base. Electronically Signed   By: Nelson Chimes M.D.   On: 07-09-17 07:47   Dg Chest Port 1 View  Result Date: 06/12/2017 CLINICAL DATA:  Intubation.  Respiratory failure . EXAM: PORTABLE CHEST 1 VIEW COMPARISON:  06/17/2017.  06/19/2017. FINDINGS: Endotracheal tube, NG tube, dual-lumen right IJ catheter, left IJ catheter in stable position. Right axillary vascular stents noted. Heart size stable. Mild right infrahilar atelectasis/ infiltrate cannot be excluded. No pleural effusion or pneumothorax. IMPRESSION: 1. Lines and tubes in stable position. 2. Mild right infrahilar atelectasis/ infiltrate cannot be excluded . Electronically Signed   By: Marcello Moores  Register   On: 06/12/2017 07:40   Dg Chest Port 1 View  Result Date: 05/30/2017 CLINICAL DATA:  Acute respiratory failure. EXAM: PORTABLE CHEST 1 VIEW COMPARISON:  June 07, 2017. FINDINGS: The cerclage are pad over the central chest. Interval insertion of an endotracheal tube with the tip in good position approximately 3.8 cm above the carina. An enteric tube is noted entering the stomach, with the distal side port just beyond the gastroesophageal junction. New left internal jugular central venous catheter with the tip projecting over the mid SVC. Unchanged tunneled right internal jugular dialysis catheter with the tip at the cavoatrial junction. The cardiomediastinal silhouette is normal in size. Normal pulmonary vascularity. No focal consolidation, pleural effusion, or pneumothorax. No acute osseous abnormality. Unchanged right axillary stent. IMPRESSION: 1. Interval placement of endotracheal and enteric tubes, as well as a left internal  jugular central venous catheter, which are in appropriate position. 2. No active cardiopulmonary disease.  No pneumothorax. Electronically Signed   By: Titus Dubin M.D.   On: 06/14/2017 16:07   Assessment: 76 year old male with coma following cardiac arrest, hypothermia protocol and rewarming.  1. On examination off all sedation he is comatose. The only elicitable brainstem reflex consists of trace corneals. 2. CT head shows diffuse symmetric  hypodensity within the posterior cerebral hemispheres. Given his clinical picture, this most likely represents cytotoxic edema secondary to diffuse anoxic brain injury.   Recommendations: 1. Prognosis likely to be poor. However, in general 72 hours should elapse prior to making prognosis based on Neurological examination, as hypothermia may slow hepatic metabolism of sedatives, requiring additional time for clearance. 2. MRI brain.  3. Continue supportive care.   A total of 45 minutes was spent in the emergent Neurological evaluation of this critically ill patient.   Electronically signed: Dr. Kerney Elbe 2017/06/24, 9:34 AM

## 2017-06-29 NOTE — Plan of Care (Signed)
Problem: Cardiac: Goal: Ability to achieve and maintain adequate cardiopulmonary perfusion will improve Outcome: Not Progressing Pt remains on high dose vasopressors

## 2017-06-29 NOTE — Progress Notes (Signed)
PULMONARY / CRITICAL CARE MEDICINE   Name: Gerald Davenport MRN: [id number] DOB: 20-Dec-1940    ADMISSION DATE:  06/02/2017   CONSULTATION DATE:  06/08/2017  REFERRING MD:  Dr. Cathlean Sauer  CHIEF COMPLAINT:  Cardiac Arrest  HISTORY OF PRESENT ILLNESS:  76 y.o. male with PMH of ESRD on MWF HD, chronic anemia, tobacco abuse/?COPD, CAD, HTN, HLD, DM, PVD with RBKA, PAD, and GERD admitted from home on 9/9 with chest pain and hypertensive urgency not related to volume overload. Nephrology and Cardiology consulted and following in hospitalization.  He had abnormal EKG, with ST depression on inferior-lateral leads, and mildly elevated troponin's thought related to demand ischemia in the setting of HTN urgency as well as ESRD.  He was being medically managed.  TTE on 9/11 was a technically difficult study but showed a normal EF at 55-60%, moderate LVH, and mild diastolic dysfunction.  Last dialyzed 0/93 without complications. On 9/13, patient was in his normal state of health and being evaluated to be discharged to inpatient rehab when he had syncopal episode with BP of 98/71.  Patient then became unresponsive, found to be bradycardic and then lost pulse.  PEA arrest for 4 mins prior to ROSC with epi given.  EKG with diffuse ST depression.  Patient remained unresponsive and was intubated for airway protection by anesthesia and moved to the ICU.  PCCM to admit.  Cardiology notified of acute changes and concern for EKG changes.  On arrival to ICU, patient suffered second PEA arrest, ROSC after 4 mins.  Treated with ACLS and presumed hyperkalemia.    SUBJECTIVE:   RN reports levophed at 40 mcg + vasopressin.  Just returned from CT due to change in pupils.  CVVHD on hold for now.     VITAL SIGNS: BP (!) 76/58   Pulse 82   Temp (!) 97.3 F (36.3 C)   Resp (!) 21   Ht 5\' 8"  (1.727 m)   Wt 133 lb 6.1 oz (60.5 kg)   SpO2 90%   BMI 20.28 kg/m   HEMODYNAMICS: CVP:  [1 mmHg-9 mmHg] 4 mmHg  VENTILATOR SETTINGS: Vent  Mode: PRVC FiO2 (%):  [40 %] 40 % Set Rate:  [18 bmp] 18 bmp Vt Set:  [580 mL] 580 mL PEEP:  [5 cmH20] 5 cmH20 Plateau Pressure:  [17 cmH20-21 cmH20] 17 cmH20  INTAKE / OUTPUT: I/O last 3 completed shifts: In: 3239 [I.V.:3119; NG/GT:120] Out: 2100 [Emesis/NG output:800; Other:1300]  PHYSICAL EXAMINATION: General: thin elderly male, appears critically ill  HEENT: MM pink/moist, ETT, R IJ tunneled HD, L IJ TLC  Neuro: obtunded, pupils 67mm sluggish CV: s1s2 rrr, no m/r/g, cooling pads in place PULM: even/non-labored, lungs bilaterally clear  AT:FTDD, non-tender, bsx4 active  Extremities: warm/dry, no edema, R BKA  Skin: no rashes or lesions  LABS:  BMET  Recent Labs Lab 06/12/17 2140 June 18, 2017 0251 2017/06/18 0257  NA 133* 134* 133*  K 4.6 5.5* 5.5*  CL 101 103 103  CO2 20* 19* 18*  BUN 44* 35* 36*  CREATININE 5.25* 4.17* 4.22*  GLUCOSE 96 85 83    Electrolytes  Recent Labs Lab 05/30/2017 1307  06/12/17 1615  06/12/17 2140 June 18, 2017 0251 06/18/2017 0257  CALCIUM  --   < > 7.1*  < > 7.2* 7.2* 7.1*  MG 1.9  --   --   --   --   --  2.0  PHOS 10.6*  --  6.3*  --   --  5.2*  --   < > =  values in this interval not displayed.  CBC  Recent Labs Lab 06/19/2017 1210  06/01/2017 2206 06/12/17 0401 2017/07/03 0257  WBC 14.9*  --   --  6.2 10.3  HGB 13.6  < > 11.9* 10.6* 9.2*  HCT 40.2  < > 35.0* 30.9* 27.6*  PLT 188  --   --  168 174  < > = values in this interval not displayed.  Coag's  Recent Labs Lab 06/10/2017 1542 06/18/2017 2256 06/12/17 0401 03-Jul-2017 0257  APTT 162* 88* 70* 72*  INR 5.37* 2.69 2.16  --     Sepsis Markers  Recent Labs Lab 06/15/2017 1308 06/02/2017 1614  LATICACIDVEN 7.0* 2.1*    ABG  Recent Labs Lab 06/28/2017 1251 06/14/2017 1615 03-Jul-2017 0039  PHART 7.402 7.467* 7.381  PCO2ART 42.0 28.7* 35.7  PO2ART 588.0* 334.0* 155.0*    Liver Enzymes  Recent Labs Lab 06/23/2017 1303 06/12/17 1019 06/12/17 1615 03-Jul-2017 0251 03-Jul-2017 0257   AST 162* 118*  --   --  130*  ALT 106* 109*  --   --  119*  ALKPHOS 45 37*  --   --  48  BILITOT 0.8 0.4  --   --  0.7  ALBUMIN 2.5* 2.2* 2.2* 2.0* 2.0*    Cardiac Enzymes  Recent Labs Lab 06/12/17 0209 06/12/17 0628 07/03/17 0257  TROPONINI 0.44* 0.48* 0.44*    Glucose  Recent Labs Lab 06/12/17 1422 06/12/17 1605 06/12/17 1800 06/12/17 1956 07-03-17 0018 2017-07-03 0403  GLUCAP 92 91 99 97 95 98    Imaging No results found.  STUDIES:  TTE 9/11:  Technically difficult study. Moderate LVH with EF 55-60%. No regional wall motion abnormalities. Grade 1 diastolic dysfunction. LA & RA normal in size. RV normal in size and function. Aortic valve poorly visualized but without stenosis or regurgitation. Aortic root normal in size. No mitral stenosis or regurgitation. No pulmonic stenosis. No tricuspid regurgitation. No pericardial effusion. LHC 9/13:  Ost 3rd Mrg lesion, 50 %stenosed.  Ost RCA lesion, 99 %stenosed.  Post intervention, there is a 20% residual stenosis.  A stent was successfully placed. EEG 9/13:  This EEG is abnormal and findings are consistent with nonspecific generalized cerebral dysfunction, epileptiform features were not seen during this recording. CT HEAD W/O 9/13:  No acute intracranial abnormalities. Mild generalized atrophy and chronic microvascular ischemic change.    CT Head 9/15:  Question of newly seen low-density in the PCA territories bilaterally, affecting the posteromedial temporal lobes and occipital lobes.  This could be secondary to brain swelling following ischemic insult.  I do not think finding is definite however.  Chronic atrophy, small vessel disease and old right frontal infarction.   MICROBIOLOGY: MRSA PCR 9/9:  Negative Blood Cultures x2 9/11 >>  ANTIBIOTICS:    SIGNIFICANT EVENTS: 9/09 - Admit with CP and HTN urgency 9/13 - Brief PEA arrest x2 >> LHC w/ RCA stent placement. Started therapeutic hypothermia.   9/14 -  Hypoglycemic overnight >> started on D51/2 NS. Art line kinked & inaccurate pressures  / hypotension 9/15 - Concern for pupil changes > stat CT head   LINES/TUBES: OETT 9/13 >>> Tunneled R HD Catheter >>> R Fem Art Sheath 9/13 >>> L Fem Art Line 9/13 >>> L Fem CVL 9/13 >>> L IJ CVL 9/13 >>> OGT 9/13 >>>  ASSESSMENT / PLAN:  PULMONARY A: Acute hypoxic respiratory failure: Status post arrest. Presumed COPD: No history of pulmonary function testing. On chronic breathing treatments at home. Tobacco  use disorder History of positive  P:   PRVC 8 cc/kg  Wean PEEP / FiO2 for sats > 90% SBT/WUA when mental status permits  Follow CXR  PRN Duoneb  CARDIOVASCULAR A:  S/P PE arrest: Occurred twice with total of 8 minutes before return of spontaneous circulation. Coronary artery disease: Status post revascularization of RCA on 9/13. Prolonged QTc:  477ms previously. Hx HTN, HLD, PAD, & PVD:  History of arterial clot right leg.  P:  Hypothermia protocol  Cardiology following Wean vasopressors for MAP > 65 Continue ASA, zocor, Brilinta  Monitor QTc, minimize agents that prolong   RENAL A:   ESRD on hemodialysis Monday, Wednesday, Friday. Hyperkalemia: Mild.  P:   Nephrology following  CVVHD ongoing  Trend BMP  Replace electrolytes as indicated  GASTROINTESTINAL A:   History of GERD History of diverticulosis  P:   NPO Consider TF   HEMATOLOGIC/ONCOLOGIC A:   Anemia:  Secondary to chronic illness. No signs of active bleeding. Coagulopathy: Likely multifactorial. History of Prostate cancer  P:  Trend CBC Transfuse per ICU guidelines, Hgb <8 Monitor INR  INFECTIOUS A:   No obvious source P:   Monitor fever curve / WBC trend  ENDOCRINE A: Diabetes mellitus type 2: Hypoglycemia overnight. History of hyperparathyroidism  P:   D51/2NS at 50 ml/hr  Continue calcitriol  CBG's per protocol   NEUROLOGIC A:   Concern for Neuro Change - noted during  rewarming phase 9/15 am, see CT head above Acute encephalopathy: Suspect secondary to anoxia post cardiac arrest. Sedation on ventilator/while paralyzed History of anxiety  P:   RASS goal: 0 to -1 Rewarmed > normothermia protocol  Monitor off sedation  Neurology consult 9/15   Prophylaxis:  SCDs, Protonix IV daily, & starting Heparin [place] q8hr.  Diet:  NPO.   Code Status:  Full Code. Disposition:  Remains critically ill in the ICU. Family Update: No family at bedside am 9/15 on NP rounding.  Will follow up.    CC Time: 40 Minutes   Noe Gens, NP-C [facility] Pgr: [phone] or if no answer [phone] June 19, 2017, 7:15 AM

## 2017-06-29 NOTE — Discharge Summary (Signed)
Physician Discharge Summary  Patient ID: Gerald Davenport MRN: [id number] DOB/AGE: [birth year] 76 y.o.  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 male with PMH of ESRD on MWF HD, chronic anemia, tobacco abuse/?COPD, CAD, HTN, HLD, DM, PVD with RBKA, PAD, and GERD admitted from home on 9/9 with chest pain and hypertensive urgency not related to volume overload. Nephrology and Cardiology consulted and following in hospitalization.  He had abnormal EKG, with ST depression on inferior-lateral leads, and mildly elevated troponin's thought related to demand ischemia in the setting of HTN urgency as well as ESRD.  He was being medically managed.  TTE on 9/11 was a technically difficult study but showed a normal EF at 55-60%, moderate LVH, and mild diastolic dysfunction.  Last dialyzed 0/73 without complications. On 9/13, patient was in his normal state of health and being evaluated to be discharged to inpatient rehab when he had syncopal episode with BP of 98/71.  Patient then became unresponsive, found to be bradycardic and then lost pulse.  PEA arrest for 4 mins prior to ROSC with epi given.  EKG with diffuse ST depression.  Patient remained unresponsive and was intubated for airway protection by anesthesia and moved to the ICU.  PCCM to admit.  Cardiology notified of acute changes and concern for EKG changes.  On arrival to ICU, patient suffered second PEA arrest, ROSC after 4 mins. Treated with ACLS and  presumed hyperkalemia.    He underwent therapeutic hypothermia protocol. He had poor mental status after rewarming. He became increasingly hypotensive requiring pressors. He had a CT of the head which showed new densities in PCA territory, brain edema. 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 After discussion with the family the code status was changed to DNR.  Neurology was consulted and the prognosis was thought to be poor. He passed away in the afternoon of 9/15/100.  Consults: Nephrology, cardiology, neurology  Disposition: 20-Expired  Discharge Instructions    AMB Referral to Cardiac Rehabilitation - Phase II    Complete by:  As directed    Diagnosis:  NSTEMI   Diet - low sodium heart healthy    Complete by:  As directed    Discharge instructions    Complete by:  As directed    Please follow with primary care in 7 days.   Increase activity slowly    Complete by:  As directed      Allergies as of Jun 18, 2017   No Known Allergies     Medication List    STOP taking these medications   cloNIDine 0.2 MG tablet Commonly known as:  CATAPRES   feeding supplement (NEPRO CARB STEADY) Liqd     TAKE these medications   aspirin 81 MG EC tablet Take 1 tablet (81 mg total) by mouth daily.   buPROPion 150 MG 24 hr tablet Commonly known as:  WELLBUTRIN XL Take 150 mg by mouth daily.   calcitRIOL 0.25 MCG capsule Commonly known as:  ROCALTROL Take 3 capsules (0.75 mcg total) by mouth every Monday, Wednesday, and Friday with hemodialysis.   calcium acetate 667 MG capsule Commonly known as:  PHOSLO Take 667-2,001 mg by mouth See admin instructions. Take 3 capsules (2001 mg) by mouth three times daily with meals and 1 capsule (667 mg) with snacks   carboxymethylcellulose 0.5 % Soln Commonly known as:  REFRESH PLUS Place 1 drop into both eyes 3 (three) times daily as needed (dry eyes/ itching).   carvedilol 6.25 MG tablet Commonly known as:  COREG Take 1 tablet (6.25 mg total)  by mouth 2 (two) times daily with a meal.   cinacalcet 30 MG tablet Commonly known as:  SENSIPAR Take 30 mg by mouth daily with breakfast.   clopidogrel 75 MG tablet Commonly known as:  PLAVIX Take 1 tablet (75 mg total) by mouth daily.   doxazosin 8 MG tablet Commonly known as:  CARDURA Take 8 mg by mouth daily.   fluorometholone 0.1 % ophthalmic suspension Commonly known as:  FML Place 1 drop into both eyes at bedtime.   furosemide 40 MG tablet Commonly known as:  LASIX Take 40 mg by mouth 2 (two) times daily.   gabapentin 100 MG capsule Commonly known as:  NEURONTIN Take 100 mg by mouth 2 (two) times daily.   hydrALAZINE 25 MG tablet Commonly known as:  APRESOLINE Take 1 tablet (25 mg total) by mouth 2 (two) times daily.   isosorbide mononitrate 30 MG 24 hr tablet Commonly known as:  IMDUR Take 1 tablet (30 mg total) by mouth daily.   LEVEMIR FLEXTOUCH 100 UNIT/ML Pen Generic drug:  Insulin Detemir Inject 10 Units into the skin daily before breakfast.   linaclotide 145 MCG Caps capsule Commonly known as:  LINZESS Take 1 capsule (145 mcg total) by mouth daily. Take 30 minutes before breakfast What changed:  when to take this  additional instructions   METAMUCIL PO Take by mouth See admin instructions. Mix 1 capful in 8 oz liquid and drink daily as needed for constipation   omeprazole 20 MG capsule Commonly known as:  PRILOSEC Take 20 mg by mouth daily.   ONGLYZA 5 MG Tabs tablet Generic drug:  saxagliptin HCl Take 5 mg by mouth daily.   QUEtiapine 200 MG tablet Commonly known as:  SEROQUEL Take 200 mg by mouth at bedtime.   sevelamer carbonate 800 MG tablet Commonly known as:  RENVELA Take 1,600 mg by mouth 3 (three) times daily with meals.   simvastatin 20 MG tablet Commonly known as:  ZOCOR Take 20 mg by mouth at bedtime.            Discharge Care Instructions        Start     Ordered   06/18/2017 0000  AMB Referral to Cardiac  Rehabilitation - Phase II    Question:  Diagnosis:  Answer:  NSTEMI   06/27/2017 1433   06/10/17 0000  aspirin 81 MG EC tablet  Daily     06/10/17 1021   06/10/17 0000  carvedilol (COREG) 6.25 MG tablet  2 times daily with meals     06/10/17 1021   06/10/17 0000  calcitRIOL (ROCALTROL) 0.25 MCG capsule  Every M-W-F (Hemodialysis)     06/10/17 1021   06/10/17 0000  hydrALAZINE (APRESOLINE) 25 MG tablet  2 times daily     06/10/17 1021   06/10/17 0000  isosorbide mononitrate (IMDUR) 30 MG 24 hr tablet  Daily  06/10/17 1021   06/10/17 0000  Increase activity slowly     06/10/17 1021   06/10/17 0000  Diet - low sodium heart healthy     06/10/17 1021   06/10/17 0000  Discharge instructions    Comments:  Please follow with primary care in 7 days.   06/10/17 1021     Follow-up Information    Alroy Dust, L.Marlou Sa, MD Follow up in 1 week(s).   Specialty:  Family Medicine Contact information: [address] [place] [address] [phone]        [place] Follow up on 06/24/2017.   Specialty:  Cardiology Why:  this is for stress test at 9:45 AM   Contact information: [address], [place]       Sueanne Margarita, MD Follow up on 07/01/2017.   Specialty:  Cardiology Why:  at 10:30 AM withher PA Ellen Henri. Contact information: [address] [place] [address] [phone]           Signed: Marshell Garfinkel 06/16/2017, 3:55 PM

## 2017-06-29 NOTE — Progress Notes (Signed)
CDS updated with TOD.  Referral [phone].

## 2017-06-29 NOTE — Progress Notes (Signed)
Pt expired at 87 with family and rn at bedside. Pt declared dead by absence of heart sounds for 1 minute via auscultation by Ellamae Sia and Hiram Gash, RN. Elink called cds. Dr. Elsworth Soho made aware

## 2017-06-29 NOTE — Progress Notes (Signed)
eLink Physician-Brief Progress Note Patient Name: Gerald Davenport DOB: [birth year] MRN: [id number]   Date of Service  2017/06/17  HPI/Events of Note  Off paralytics and sedation.  Patient is not responsive with large bilateral pupils.  Worsening hypotension with MAP of 33 on max NE.  eICU Interventions  Plan: Stat head CT without contrast Vasopressin added for BP support     Intervention Category Major Interventions: Other:;Shock - evaluation and management  [name] 06/17/2017, 6:17 AM

## 2017-06-29 NOTE — Progress Notes (Signed)
[facility] Progress Note  Subjective: on vent, sedated,  off paralytics, sp cooling   Vitals:   06/24/17 1300 2017-06-24 1330 2017/06/24 1400 06-24-17 1430  BP:      Pulse:      Resp: _0 Temp: (!) 94.5 F (34.7 C) (!) 93.9 F (34.4 C) (!) 93.6 F (34.2 C) (!) 93 F (33.9 C)  TempSrc: Core (Comment)  Core (Comment)   SpO2:      Weight:      Height:        Inpatient medications: . aspirin  81 mg Per Tube Daily  . calcitRIOL  0.75 mcg Per Tube Q M,W,F-HD  . chlorhexidine gluconate (MEDLINE KIT)  15 mL Mouth Rinse BID  . Chlorhexidine Gluconate Cloth  6 each Topical Daily  . fluorometholone  1 drop Both Eyes QHS  . heparin subcutaneous  5,000 Units Subcutaneous Q8H  . mouth rinse  15 mL Mouth Rinse 10 times per day  . multivitamin  1 tablet Oral QHS  . pantoprazole (PROTONIX) IV  40 mg Intravenous Daily  . simvastatin  20 mg Per Tube QHS  . sodium chloride flush  3 mL Intravenous Q12H  . ticagrelor  90 mg Per Tube Q12H   . sodium chloride    . dextrose 5 % and 0.45% NaCl 50 mL/hr at June 24, 2017 0700  . ferric gluconate (FERRLECIT/NULECIT) IV 62.5 mg (06/10/17 1718)  . norepinephrine (LEVOPHED) Adult infusion 40 mcg/min (06/24/17 1345)  . dialysis replacement fluid (prismasate) 400 mL/hr at 2017/06/24 0200  . dialysis replacement fluid (prismasate) 200 mL/hr at 06/12/17 1254  . dialysate (PRISMASATE) 1,800 mL/hr at June 24, 2017 1332  . sodium chloride    . vasopressin (PITRESSIN) infusion - *FOR SHOCK* Stopped (June 24, 2017 1254)   sodium chloride, acetaminophen, alteplase, fentaNYL (SUBLIMAZE) injection, heparin, ipratropium-albuterol, midazolam, ondansetron (ZOFRAN) IV, sodium chloride, sodium chloride flush  Exam: On vent, paralytics/ sedation No repsonse No jvd Chest CTA bilat RRR 1/6 sem no RG Abd scaphoid, soft ND Ext R BKA no edema, no LLE edema R IJ perm cath   CXR 9/9, 9/13 - no edema   Dialysis: [facility] [place]   MWF 4h    61kg    R IJ cath  (refused other access)   Hep none -calcitriol 0.75 ug -venofer 50 /wk      Impression: 1  SP PEA arrest - on cooling protocol  2  CP, hx CAD - sp cath 9/13 w PCI to 99% RCA 3  ESRD HD mwf 4  Hypotension - on pressors still 5  Anemia ok no ESA 6  DM controlled 7  PVD 8  COPD - on nebs at home 9  ^K+ - should improve w CRRT  Plan - cont CRRT , keeping even   Kelly Splinter MD [facility] pager [phone]   06-24-2017, 3:06 PM    Recent Labs Lab 06/02/2017 1307  06/12/17 1615  2017/06/24 0251 June 24, 2017 0257 06-24-2017 0645  NA  --   < > 133*  < > 134* 133* 134*  K  --   < > 4.4  < > 5.5* 5.5* 5.4*  CL  --   < > 101  < > 103 103 103  CO2  --   < > 18*  < > 19* 18* 18*  GLUCOSE  --   < > 92  < > 85 83 81  BUN  --   < > 59*  < > 35* 36*  35*  CREATININE  --   < > 6.67*  < > 4.17* 4.22* 4.36*  CALCIUM  --   < > 7.1*  < > 7.2* 7.1* 6.9*  PHOS 10.6*  --  6.3*  --  5.2*  --   --   < > = values in this interval not displayed.  Recent Labs Lab 06/10/2017 1303 06/12/17 1019 06/12/17 1615 2017/07/08 0251 2017/07/08 0257  AST 162* 118*  --   --  130*  ALT 106* 109*  --   --  119*  ALKPHOS 45 37*  --   --  48  BILITOT 0.8 0.4  --   --  0.7  PROT 5.8* 5.2*  --   --  5.4*  ALBUMIN 2.5* 2.2* 2.2* 2.0* 2.0*    Recent Labs Lab 06/09/17 1442 06/10/17 0342 06/28/2017 1210  06/25/2017 2206 06/12/17 0401 July 08, 2017 0257  WBC 13.6* 13.2* 14.9*  --   --  6.2 10.3  NEUTROABS 11.1* 10.6* 11.8*  --   --   --   --   HGB 15.0 15.9 13.6  < > 11.9* 10.6* 9.2*  HCT 44.1 46.9 40.2  < > 35.0* 30.9* 27.6*  MCV 106.5* 104.2* 103.9*  --   --  101.6* 102.2*  PLT 197 202 188  --   --  168 174  < > = values in this interval not displayed. Iron/TIBC/Ferritin/ %Sat No results found for: IRON, TIBC, FERRITIN, IRONPCTSAT

## 2017-06-29 NOTE — Plan of Care (Signed)
Problem: Fluid Volume: Goal: Ability to maintain a balanced intake and output will improve Outcome: Not Progressing Pt started on CVVHD  Problem: Nutrition: Goal: Adequate nutrition will be maintained Outcome: Not Progressing Pt is NPO, nurtition consult, will start tube feeds once rewarmed and off paralytic

## 2017-06-29 NOTE — Progress Notes (Signed)
Normothermia terminated. Pt's core temp has been 94 for 3 hours and water temp reached 107 degress, machine stopped therapy and stated too hot for pts skin. Called 1800 number, machine is funtional, pt's status is the problem. Dr. Learta Codding aware. Warm blankets and blood warmer are in place. Cont to monitor. Etta Quill

## 2017-06-29 NOTE — Progress Notes (Signed)
   2017/07/06 1800  Clinical Encounter Type  Visited With Family  Visit Type Death  Referral From (no referral)  Consult/Referral To Chaplain  Spiritual Encounters  Spiritual Needs Emotional  Stress Factors  Patient Stress Factors None identified  Family Stress Factors Health changes;Major life changes  Chaplain passed the family on 2H in the midst of the PT passing away.  Chaplain was not called but crossed the family while visiting another patient  PT expired and Chaplain ensured the family they had full support of the spiritual care staff.  The family was working to make sure all arrangements have been made and everyone that wanted to see the PT had a chance to see him.  Prayer was offered as was emotional and grief support for the family.

## 2017-06-29 DEATH — deceased

## 2017-07-01 ENCOUNTER — Ambulatory Visit: Payer: Medicare Other | Admitting: Cardiology

## 2018-03-30 IMAGING — CR DG CHEST 1V PORT
1 series · 1 of 1 positions shown · non-contrast
Comparison: 01/17/2015

CLINICAL DATA: Encounter for central line placement.

EXAM:
PORTABLE CHEST 1 VIEW

[AP]
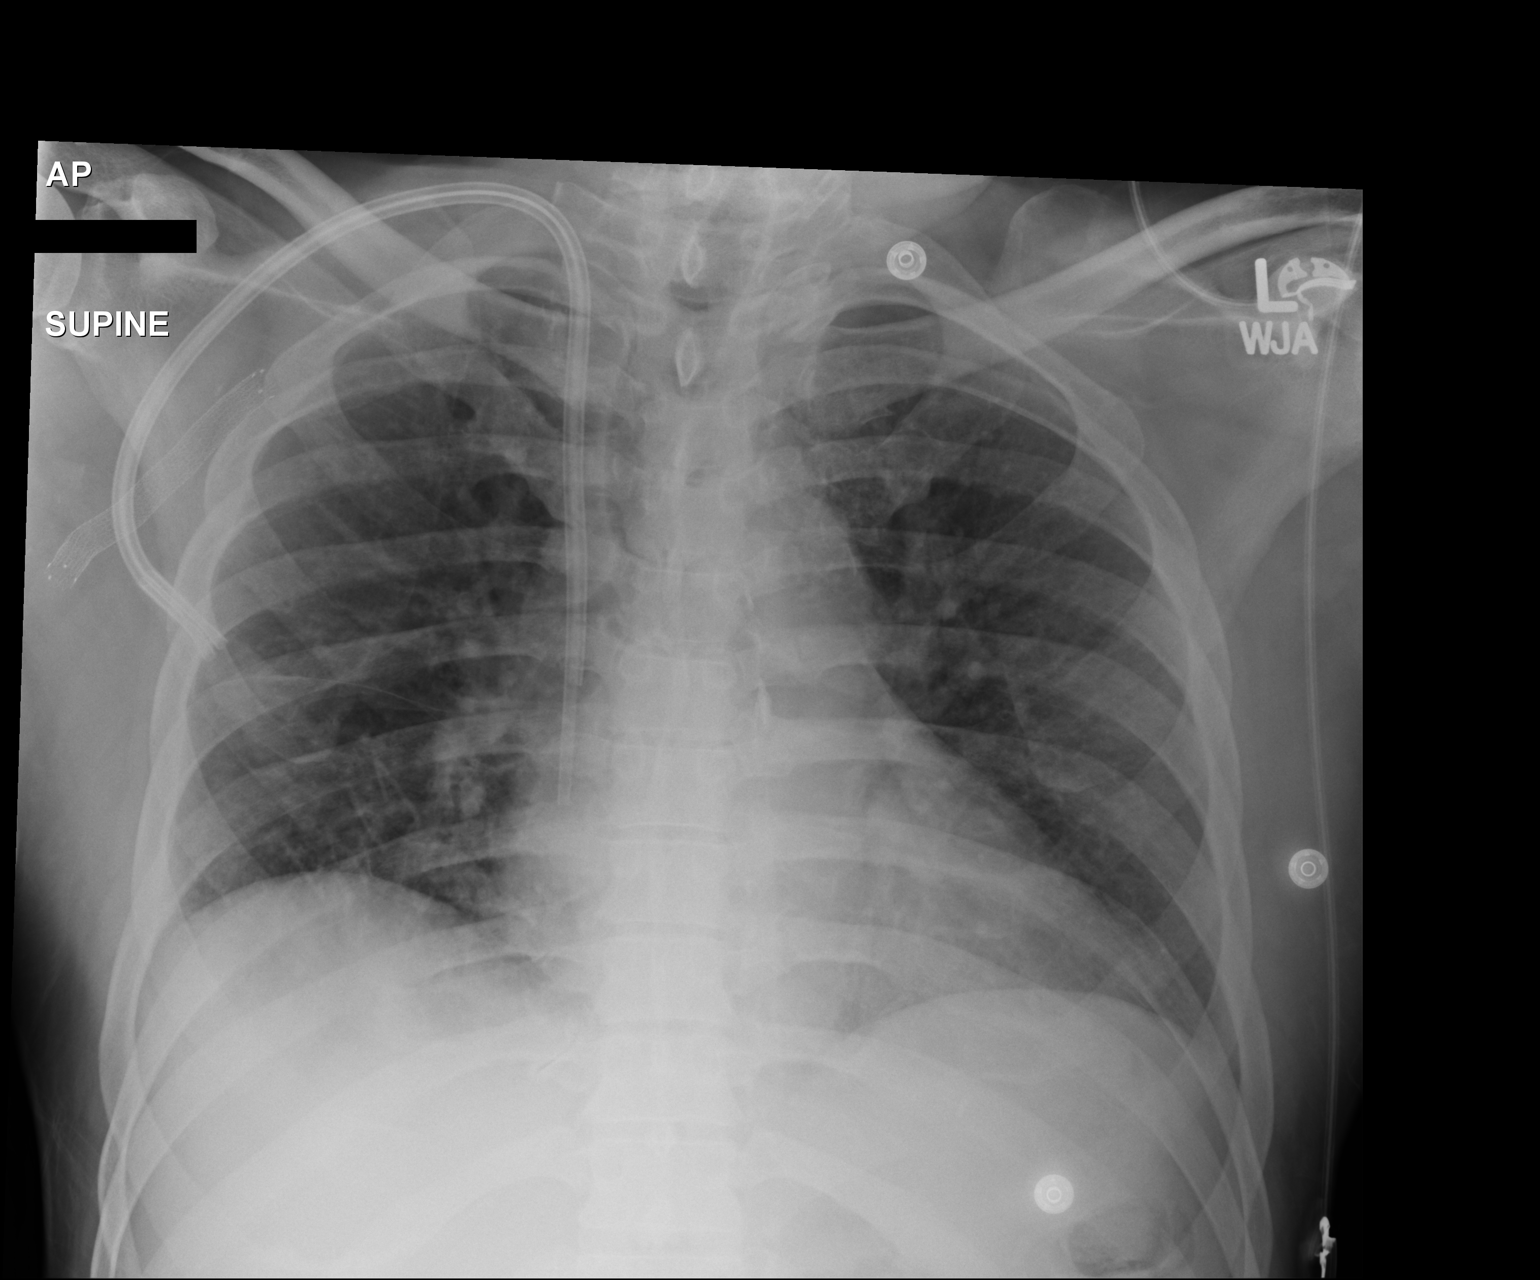

[1 of 1 positions shown; findings below may reference images not displayed]

FINDINGS: Right internal jugular dual-lumen central venous catheter has its
distal tip projecting at the caval atrial junction.

No pneumothorax.

Clear lungs.  Heart, mediastinum and hila are unremarkable.
IMPRESSION: 1. Right internal jugular dual-lumen central venous catheter is well
positioned with its distal port tip at the caval atrial junction.
2. No pneumothorax.  No acute cardiopulmonary disease

## 2018-05-12 NOTE — Telephone Encounter (Signed)
See anpve/

## 2018-09-06 IMAGING — DX DG CHEST 2V
2 series · 2 of 2 positions shown · non-contrast
Comparison: Chest radiograph performed 01/19/2016

CLINICAL DATA: Bleeding from dialysis catheter, acute onset.
Initial encounter.

EXAM:
CHEST  2 VIEW

[chest lat]
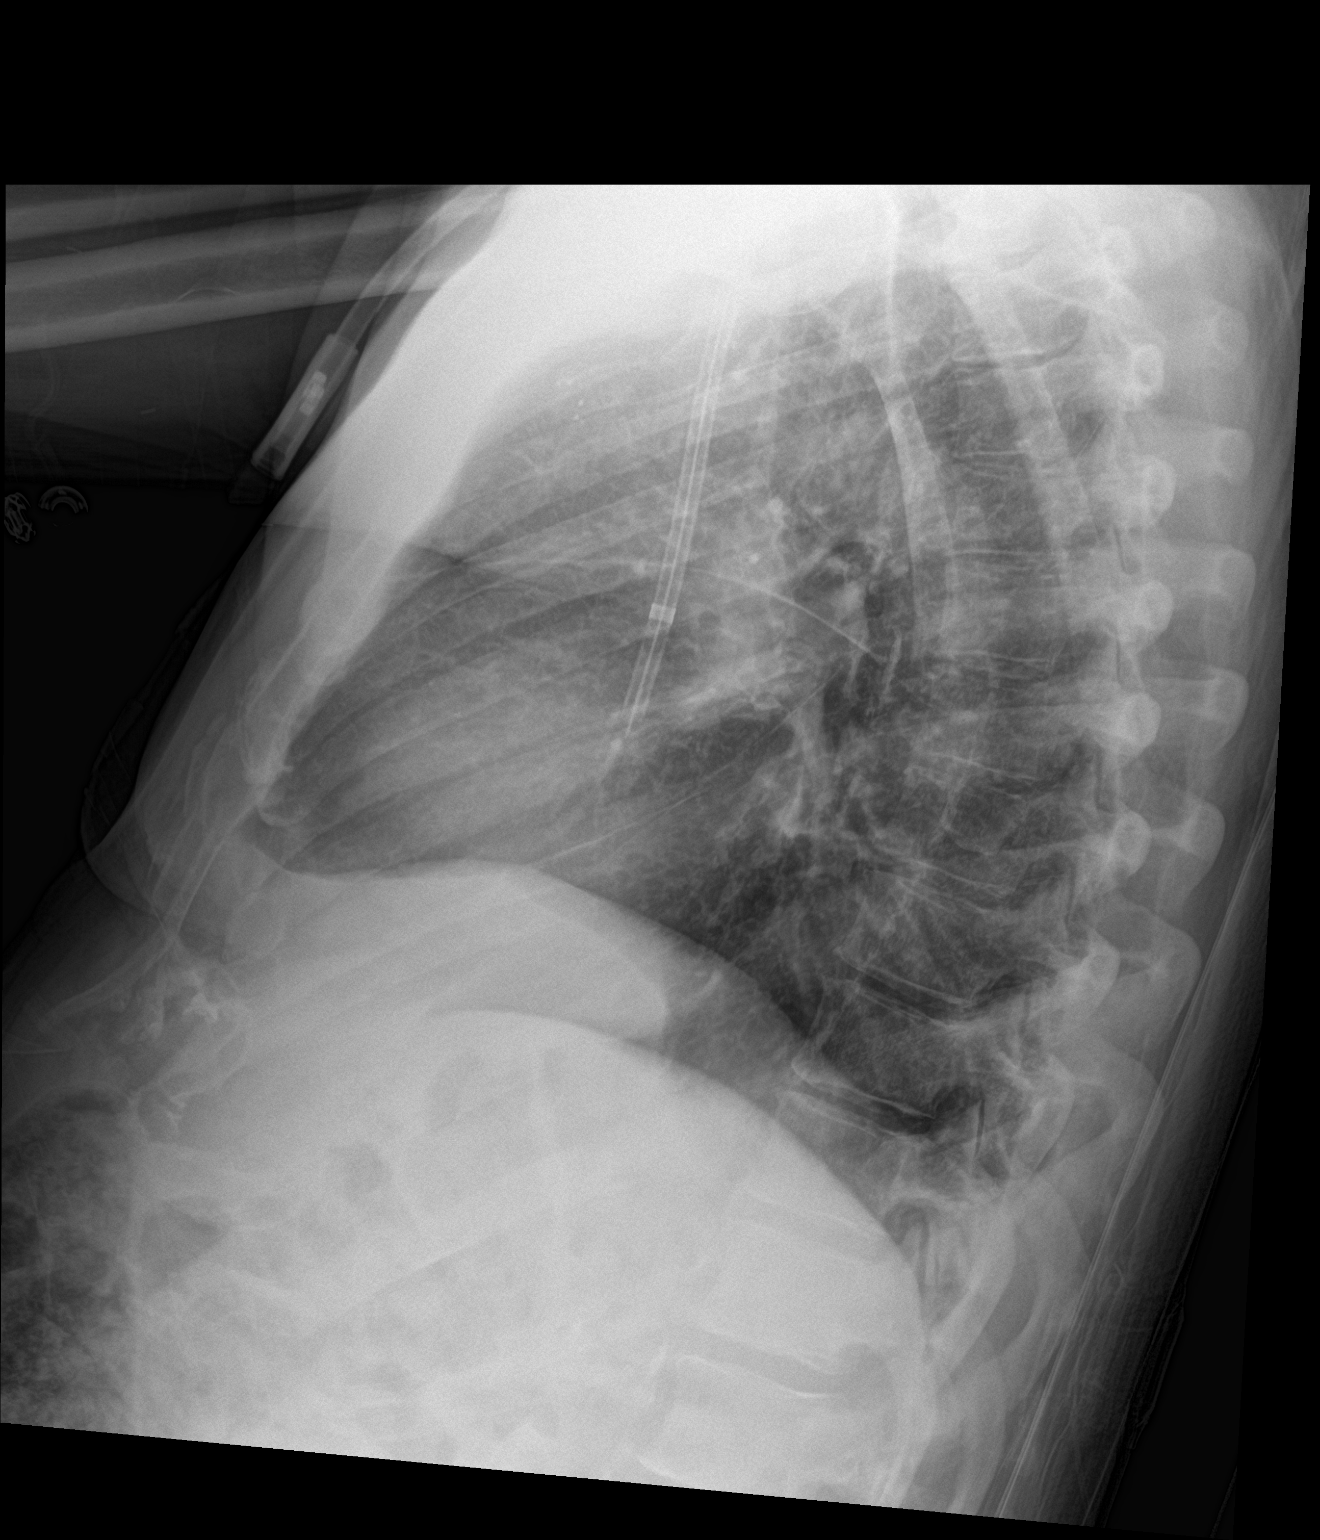

[chest ap]
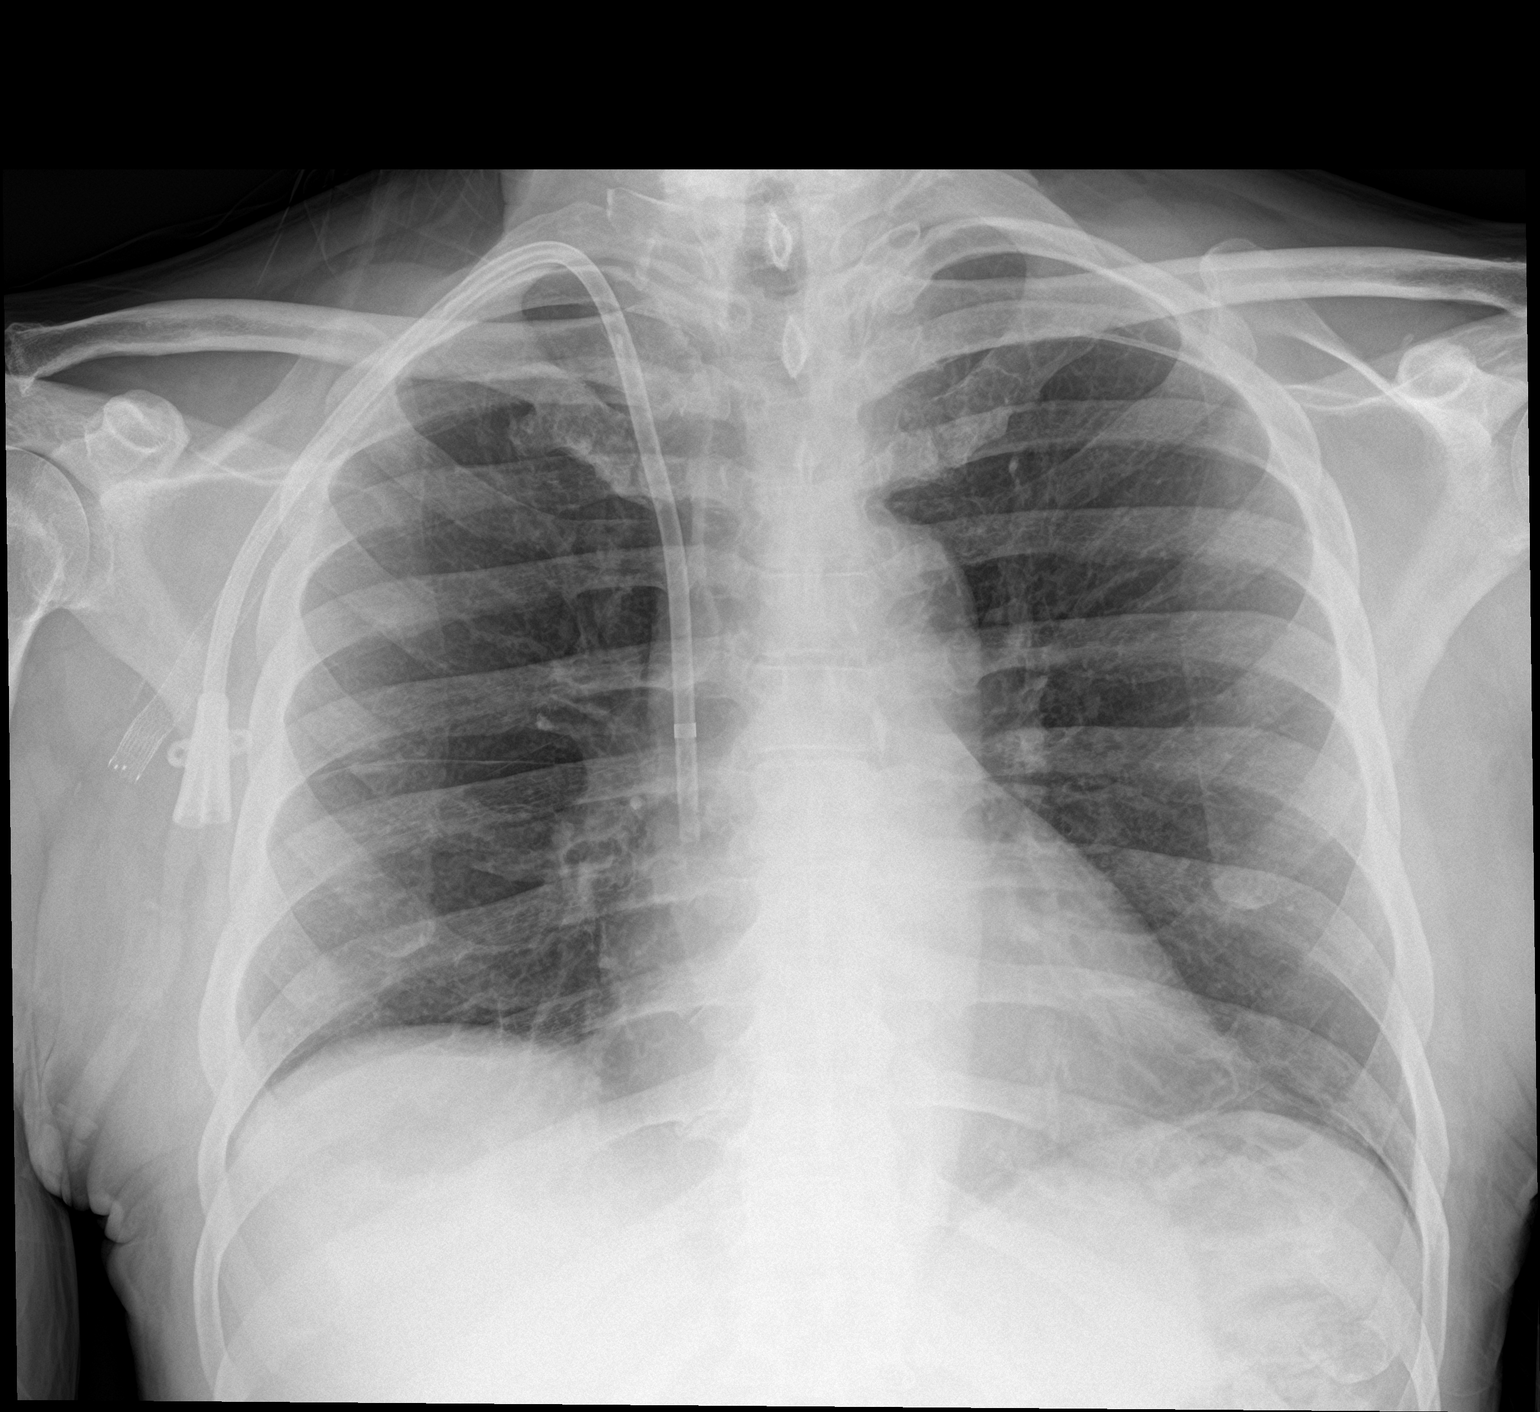

[2 of 2 positions shown; findings below may reference images not displayed]

FINDINGS: The lungs are well-aerated. Minimal bibasilar atelectasis is noted.
There is no evidence of pleural effusion or pneumothorax.

The heart is borderline normal in size. No acute osseous
abnormalities are seen. A right axillary vascular stent is noted. A
right-sided dual-lumen catheter is seen ending about the distal SVC.
Mild calcification is seen along the proximal abdominal aorta.
IMPRESSION: 1. Minimal bibasilar atelectasis noted.  Lungs otherwise clear.
2. Mild aortic atherosclerosis.
